# Patient Record
Sex: Female | Born: 1965 | Race: Black or African American | Hispanic: No | Marital: Single | Smoking: Never smoker
Health system: Southern US, Community
[De-identification: ages and names within clinical notes are randomized; demographics above are authoritative.]

## PROBLEM LIST (undated history)

## (undated) DIAGNOSIS — A0472 Enterocolitis due to Clostridium difficile, not specified as recurrent: Secondary | ICD-10-CM

## (undated) DIAGNOSIS — N611 Abscess of the breast and nipple: Secondary | ICD-10-CM

## (undated) DIAGNOSIS — I272 Pulmonary hypertension, unspecified: Secondary | ICD-10-CM

## (undated) DIAGNOSIS — Z7901 Long term (current) use of anticoagulants: Secondary | ICD-10-CM

## (undated) DIAGNOSIS — I2510 Atherosclerotic heart disease of native coronary artery without angina pectoris: Secondary | ICD-10-CM

## (undated) DIAGNOSIS — I314 Cardiac tamponade: Secondary | ICD-10-CM

## (undated) DIAGNOSIS — H269 Unspecified cataract: Secondary | ICD-10-CM

## (undated) DIAGNOSIS — I509 Heart failure, unspecified: Secondary | ICD-10-CM

## (undated) DIAGNOSIS — M351 Other overlap syndromes: Secondary | ICD-10-CM

## (undated) DIAGNOSIS — J984 Other disorders of lung: Secondary | ICD-10-CM

## (undated) DIAGNOSIS — I00 Rheumatic fever without heart involvement: Secondary | ICD-10-CM

## (undated) DIAGNOSIS — I7300 Raynaud's syndrome without gangrene: Secondary | ICD-10-CM

## (undated) DIAGNOSIS — H35039 Hypertensive retinopathy, unspecified eye: Secondary | ICD-10-CM

## (undated) HISTORY — PX: COLONOSCOPY: SHX174

## (undated) HISTORY — DX: Abscess of the breast and nipple: N61.1

## (undated) HISTORY — DX: Unspecified cataract: H26.9

## (undated) HISTORY — DX: Hypertensive retinopathy, unspecified eye: H35.039

## (undated) HISTORY — DX: Raynaud's syndrome without gangrene: I73.00

## (undated) HISTORY — DX: Pulmonary hypertension, unspecified: I27.20

## (undated) HISTORY — PX: TUBAL LIGATION: SHX77

## (undated) HISTORY — DX: Enterocolitis due to Clostridium difficile, not specified as recurrent: A04.72

## (undated) HISTORY — DX: Rheumatic fever without heart involvement: I00

## (undated) HISTORY — DX: Long term (current) use of anticoagulants: Z79.01

## (undated) HISTORY — DX: Heart failure, unspecified: I50.9

## (undated) HISTORY — DX: Other disorders of lung: J98.4

## (undated) HISTORY — DX: Other overlap syndromes: M35.1

## (undated) HISTORY — DX: Cardiac tamponade: I31.4

## (undated) HISTORY — DX: Atherosclerotic heart disease of native coronary artery without angina pectoris: I25.10

---

## 1998-04-27 ENCOUNTER — Ambulatory Visit (HOSPITAL_COMMUNITY): Admission: RE | Admit: 1998-04-27 | Discharge: 1998-04-27 | Payer: Self-pay

## 1998-07-09 ENCOUNTER — Other Ambulatory Visit: Admission: RE | Admit: 1998-07-09 | Discharge: 1998-07-09 | Payer: Self-pay | Admitting: Obstetrics and Gynecology

## 1998-07-14 ENCOUNTER — Emergency Department (HOSPITAL_COMMUNITY): Admission: EM | Admit: 1998-07-14 | Discharge: 1998-07-14 | Payer: Self-pay | Admitting: Emergency Medicine

## 1999-03-06 ENCOUNTER — Ambulatory Visit: Admission: RE | Admit: 1999-03-06 | Discharge: 1999-03-06 | Payer: Self-pay | Admitting: Internal Medicine

## 2000-06-01 ENCOUNTER — Other Ambulatory Visit: Admission: RE | Admit: 2000-06-01 | Discharge: 2000-06-01 | Payer: Self-pay

## 2000-09-02 ENCOUNTER — Emergency Department (HOSPITAL_COMMUNITY): Admission: EM | Admit: 2000-09-02 | Discharge: 2000-09-02 | Payer: Self-pay | Admitting: Emergency Medicine

## 2000-09-04 ENCOUNTER — Emergency Department (HOSPITAL_COMMUNITY): Admission: EM | Admit: 2000-09-04 | Discharge: 2000-09-04 | Payer: Self-pay | Admitting: Emergency Medicine

## 2001-05-24 ENCOUNTER — Other Ambulatory Visit: Admission: RE | Admit: 2001-05-24 | Discharge: 2001-05-24 | Payer: Self-pay | Admitting: Obstetrics and Gynecology

## 2002-06-21 ENCOUNTER — Other Ambulatory Visit: Admission: RE | Admit: 2002-06-21 | Discharge: 2002-06-21 | Payer: Self-pay | Admitting: Obstetrics and Gynecology

## 2003-09-18 ENCOUNTER — Ambulatory Visit (HOSPITAL_COMMUNITY): Admission: RE | Admit: 2003-09-18 | Discharge: 2003-09-18 | Payer: Self-pay | Admitting: Obstetrics and Gynecology

## 2003-10-10 ENCOUNTER — Other Ambulatory Visit: Admission: RE | Admit: 2003-10-10 | Discharge: 2003-10-10 | Payer: Self-pay | Admitting: Obstetrics and Gynecology

## 2004-11-17 ENCOUNTER — Other Ambulatory Visit: Admission: RE | Admit: 2004-11-17 | Discharge: 2004-11-17 | Payer: Self-pay | Admitting: Obstetrics and Gynecology

## 2005-05-05 ENCOUNTER — Other Ambulatory Visit: Admission: RE | Admit: 2005-05-05 | Discharge: 2005-05-05 | Payer: Self-pay | Admitting: Obstetrics and Gynecology

## 2005-09-16 ENCOUNTER — Other Ambulatory Visit: Admission: RE | Admit: 2005-09-16 | Discharge: 2005-09-16 | Payer: Self-pay | Admitting: Obstetrics and Gynecology

## 2005-12-15 ENCOUNTER — Other Ambulatory Visit: Admission: RE | Admit: 2005-12-15 | Discharge: 2005-12-15 | Payer: Self-pay | Admitting: Obstetrics and Gynecology

## 2005-12-31 ENCOUNTER — Encounter: Admission: RE | Admit: 2005-12-31 | Discharge: 2005-12-31 | Payer: Self-pay | Admitting: Obstetrics and Gynecology

## 2006-02-09 ENCOUNTER — Encounter: Payer: Self-pay | Admitting: Emergency Medicine

## 2006-04-05 ENCOUNTER — Other Ambulatory Visit: Admission: RE | Admit: 2006-04-05 | Discharge: 2006-04-05 | Payer: Self-pay | Admitting: Obstetrics and Gynecology

## 2006-04-26 ENCOUNTER — Other Ambulatory Visit: Admission: RE | Admit: 2006-04-26 | Discharge: 2006-04-26 | Payer: Self-pay | Admitting: Obstetrics and Gynecology

## 2007-01-10 ENCOUNTER — Encounter: Admission: RE | Admit: 2007-01-10 | Discharge: 2007-01-10 | Payer: Self-pay | Admitting: Obstetrics and Gynecology

## 2007-03-14 ENCOUNTER — Emergency Department (HOSPITAL_COMMUNITY): Admission: EM | Admit: 2007-03-14 | Discharge: 2007-03-14 | Payer: Self-pay | Admitting: Emergency Medicine

## 2007-03-24 ENCOUNTER — Emergency Department (HOSPITAL_COMMUNITY): Admission: EM | Admit: 2007-03-24 | Discharge: 2007-03-24 | Payer: Self-pay | Admitting: Family Medicine

## 2007-12-27 ENCOUNTER — Encounter: Payer: Self-pay | Admitting: Cardiology

## 2008-02-20 ENCOUNTER — Encounter: Admission: RE | Admit: 2008-02-20 | Discharge: 2008-02-20 | Payer: Self-pay | Admitting: Obstetrics and Gynecology

## 2008-07-05 ENCOUNTER — Encounter: Payer: Self-pay | Admitting: Cardiology

## 2008-07-11 ENCOUNTER — Emergency Department (HOSPITAL_COMMUNITY): Admission: EM | Admit: 2008-07-11 | Discharge: 2008-07-11 | Payer: Self-pay | Admitting: Family Medicine

## 2008-08-01 ENCOUNTER — Ambulatory Visit: Payer: Self-pay | Admitting: Internal Medicine

## 2008-08-01 DIAGNOSIS — I7300 Raynaud's syndrome without gangrene: Secondary | ICD-10-CM | POA: Insufficient documentation

## 2008-08-01 DIAGNOSIS — J8410 Pulmonary fibrosis, unspecified: Secondary | ICD-10-CM | POA: Insufficient documentation

## 2008-08-01 DIAGNOSIS — M329 Systemic lupus erythematosus, unspecified: Secondary | ICD-10-CM | POA: Insufficient documentation

## 2008-09-12 ENCOUNTER — Ambulatory Visit: Payer: Self-pay | Admitting: Internal Medicine

## 2008-10-22 ENCOUNTER — Ambulatory Visit (HOSPITAL_COMMUNITY): Admission: RE | Admit: 2008-10-22 | Discharge: 2008-10-22 | Payer: Self-pay | Admitting: Obstetrics and Gynecology

## 2008-12-04 ENCOUNTER — Encounter: Payer: Self-pay | Admitting: Internal Medicine

## 2008-12-09 ENCOUNTER — Encounter: Payer: Self-pay | Admitting: Internal Medicine

## 2009-02-13 ENCOUNTER — Encounter: Payer: Self-pay | Admitting: Internal Medicine

## 2009-02-20 ENCOUNTER — Encounter: Payer: Self-pay | Admitting: Internal Medicine

## 2009-02-24 ENCOUNTER — Encounter: Payer: Self-pay | Admitting: Internal Medicine

## 2009-03-07 ENCOUNTER — Encounter: Payer: Self-pay | Admitting: Internal Medicine

## 2009-03-14 ENCOUNTER — Telehealth (INDEPENDENT_AMBULATORY_CARE_PROVIDER_SITE_OTHER): Payer: Self-pay | Admitting: *Deleted

## 2009-03-18 ENCOUNTER — Ambulatory Visit: Payer: Self-pay | Admitting: Internal Medicine

## 2009-03-19 ENCOUNTER — Ambulatory Visit: Payer: Self-pay | Admitting: Internal Medicine

## 2009-03-20 ENCOUNTER — Encounter: Payer: Self-pay | Admitting: Obstetrics and Gynecology

## 2009-03-20 ENCOUNTER — Inpatient Hospital Stay (HOSPITAL_COMMUNITY): Admission: AD | Admit: 2009-03-20 | Discharge: 2009-03-20 | Payer: Self-pay | Admitting: Obstetrics and Gynecology

## 2009-03-20 ENCOUNTER — Encounter: Payer: Self-pay | Admitting: Internal Medicine

## 2009-03-24 ENCOUNTER — Telehealth: Payer: Self-pay | Admitting: Internal Medicine

## 2009-03-28 ENCOUNTER — Ambulatory Visit: Payer: Self-pay | Admitting: Internal Medicine

## 2009-03-28 ENCOUNTER — Inpatient Hospital Stay (HOSPITAL_COMMUNITY): Admission: AD | Admit: 2009-03-28 | Discharge: 2009-04-02 | Payer: Self-pay | Admitting: Obstetrics and Gynecology

## 2009-03-28 ENCOUNTER — Ambulatory Visit: Payer: Self-pay | Admitting: Cardiovascular Disease

## 2009-03-29 ENCOUNTER — Encounter (INDEPENDENT_AMBULATORY_CARE_PROVIDER_SITE_OTHER): Payer: Self-pay | Admitting: Obstetrics and Gynecology

## 2009-04-01 ENCOUNTER — Encounter: Payer: Self-pay | Admitting: Internal Medicine

## 2009-04-03 ENCOUNTER — Telehealth (INDEPENDENT_AMBULATORY_CARE_PROVIDER_SITE_OTHER): Payer: Self-pay | Admitting: *Deleted

## 2009-04-11 ENCOUNTER — Ambulatory Visit: Payer: Self-pay | Admitting: Internal Medicine

## 2009-04-11 DIAGNOSIS — I2789 Other specified pulmonary heart diseases: Secondary | ICD-10-CM

## 2009-04-18 ENCOUNTER — Encounter: Payer: Self-pay | Admitting: Internal Medicine

## 2009-04-18 ENCOUNTER — Telehealth (INDEPENDENT_AMBULATORY_CARE_PROVIDER_SITE_OTHER): Payer: Self-pay | Admitting: *Deleted

## 2009-04-23 ENCOUNTER — Ambulatory Visit: Payer: Self-pay | Admitting: Internal Medicine

## 2009-04-25 ENCOUNTER — Ambulatory Visit: Payer: Self-pay | Admitting: Internal Medicine

## 2009-04-25 DIAGNOSIS — N08 Glomerular disorders in diseases classified elsewhere: Secondary | ICD-10-CM | POA: Insufficient documentation

## 2009-04-25 DIAGNOSIS — N049 Nephrotic syndrome with unspecified morphologic changes: Secondary | ICD-10-CM

## 2009-04-25 DIAGNOSIS — J9610 Chronic respiratory failure, unspecified whether with hypoxia or hypercapnia: Secondary | ICD-10-CM | POA: Insufficient documentation

## 2009-04-29 ENCOUNTER — Telehealth (INDEPENDENT_AMBULATORY_CARE_PROVIDER_SITE_OTHER): Payer: Self-pay | Admitting: *Deleted

## 2009-05-21 ENCOUNTER — Encounter: Payer: Self-pay | Admitting: Internal Medicine

## 2009-05-21 ENCOUNTER — Ambulatory Visit: Payer: Self-pay

## 2009-05-22 ENCOUNTER — Encounter: Payer: Self-pay | Admitting: Internal Medicine

## 2009-05-23 ENCOUNTER — Encounter: Payer: Self-pay | Admitting: Emergency Medicine

## 2009-05-26 ENCOUNTER — Telehealth: Payer: Self-pay | Admitting: Internal Medicine

## 2009-06-10 ENCOUNTER — Ambulatory Visit: Payer: Self-pay | Admitting: Emergency Medicine

## 2009-06-10 LAB — CONVERTED CEMR LAB
ANA Titer 1: 1:320 {titer} — ABNORMAL HIGH
ds DNA Ab: 69 — ABNORMAL HIGH (ref <5)

## 2009-06-17 ENCOUNTER — Encounter: Payer: Self-pay | Admitting: Internal Medicine

## 2009-06-19 ENCOUNTER — Encounter: Payer: Self-pay | Admitting: Internal Medicine

## 2009-06-20 ENCOUNTER — Ambulatory Visit: Payer: Self-pay | Admitting: Cardiology

## 2009-06-20 ENCOUNTER — Telehealth: Payer: Self-pay | Admitting: Emergency Medicine

## 2009-06-20 DIAGNOSIS — I5032 Chronic diastolic (congestive) heart failure: Secondary | ICD-10-CM

## 2009-06-20 DIAGNOSIS — I059 Rheumatic mitral valve disease, unspecified: Secondary | ICD-10-CM

## 2009-06-20 LAB — CONVERTED CEMR LAB
CO2: 34 meq/L — ABNORMAL HIGH (ref 19–32)
Calcium: 8.4 mg/dL (ref 8.4–10.5)
Chloride: 104 meq/L (ref 96–112)
Creatinine, Ser: 0.8 mg/dL (ref 0.4–1.2)
HCT: 38.1 % (ref 36.0–46.0)
Hemoglobin: 12.9 g/dL (ref 12.0–15.0)
Lymphs Abs: 0.9 10*3/uL (ref 0.7–4.0)
MCV: 91.6 fL (ref 78.0–100.0)
Monocytes Relative: 11.6 % (ref 3.0–12.0)
Prothrombin Time: 9.6 s (ref 9.1–11.7)
RBC: 4.16 M/uL (ref 3.87–5.11)
RDW: 12.9 % (ref 11.5–14.6)
Sodium: 142 meq/L (ref 135–145)

## 2009-06-24 ENCOUNTER — Telehealth: Payer: Self-pay | Admitting: Emergency Medicine

## 2009-06-25 ENCOUNTER — Ambulatory Visit: Payer: Self-pay | Admitting: Cardiology

## 2009-06-25 ENCOUNTER — Inpatient Hospital Stay (HOSPITAL_BASED_OUTPATIENT_CLINIC_OR_DEPARTMENT_OTHER): Admission: RE | Admit: 2009-06-25 | Discharge: 2009-06-25 | Payer: Self-pay | Admitting: Cardiology

## 2009-06-25 ENCOUNTER — Ambulatory Visit (HOSPITAL_COMMUNITY): Admission: RE | Admit: 2009-06-25 | Discharge: 2009-06-25 | Payer: Self-pay | Admitting: Cardiology

## 2009-06-25 ENCOUNTER — Encounter: Payer: Self-pay | Admitting: Cardiology

## 2009-06-25 ENCOUNTER — Telehealth: Payer: Self-pay | Admitting: Cardiology

## 2009-06-26 ENCOUNTER — Telehealth: Payer: Self-pay | Admitting: Cardiology

## 2009-06-27 ENCOUNTER — Telehealth: Payer: Self-pay | Admitting: Cardiology

## 2009-06-30 ENCOUNTER — Encounter: Payer: Self-pay | Admitting: Internal Medicine

## 2009-06-30 ENCOUNTER — Ambulatory Visit: Payer: Self-pay | Admitting: Thoracic Surgery (Cardiothoracic Vascular Surgery)

## 2009-07-01 ENCOUNTER — Ambulatory Visit: Payer: Self-pay | Admitting: Cardiology

## 2009-07-01 LAB — CONVERTED CEMR LAB
BUN: 12 mg/dL (ref 6–23)
Chloride: 103 meq/L (ref 96–112)
GFR calc non Af Amer: 117.71 mL/min (ref >60)
Glucose, Bld: 77 mg/dL (ref 70–99)
Pro B Natriuretic peptide (BNP): 153 pg/mL — ABNORMAL HIGH (ref 0.0–100.0)
Sodium: 141 meq/L (ref 135–145)

## 2009-07-02 ENCOUNTER — Ambulatory Visit: Payer: Self-pay | Admitting: Emergency Medicine

## 2009-07-14 ENCOUNTER — Encounter: Payer: Self-pay | Admitting: Cardiology

## 2009-07-18 ENCOUNTER — Encounter: Payer: Self-pay | Admitting: Cardiology

## 2009-07-21 ENCOUNTER — Ambulatory Visit: Payer: Self-pay | Admitting: Cardiology

## 2009-07-22 ENCOUNTER — Telehealth: Payer: Self-pay | Admitting: Cardiology

## 2009-07-23 ENCOUNTER — Encounter: Payer: Self-pay | Admitting: Cardiology

## 2009-07-23 ENCOUNTER — Encounter: Payer: Self-pay | Admitting: Internal Medicine

## 2009-07-25 ENCOUNTER — Ambulatory Visit: Payer: Self-pay | Admitting: Internal Medicine

## 2009-07-25 LAB — CONVERTED CEMR LAB: POC INR: 1.2

## 2009-07-28 LAB — CONVERTED CEMR LAB: CO2: 25 meq/L (ref 19–32)

## 2009-08-01 ENCOUNTER — Ambulatory Visit: Payer: Self-pay | Admitting: Cardiology

## 2009-08-06 ENCOUNTER — Telehealth: Payer: Self-pay | Admitting: Cardiology

## 2009-08-07 ENCOUNTER — Telehealth: Payer: Self-pay | Admitting: Cardiology

## 2009-08-08 ENCOUNTER — Ambulatory Visit: Payer: Self-pay | Admitting: Internal Medicine

## 2009-08-08 ENCOUNTER — Ambulatory Visit: Payer: Self-pay | Admitting: Emergency Medicine

## 2009-08-08 LAB — CONVERTED CEMR LAB: POC INR: 1.7

## 2009-08-13 ENCOUNTER — Telehealth: Payer: Self-pay | Admitting: Emergency Medicine

## 2009-08-15 ENCOUNTER — Ambulatory Visit: Payer: Self-pay | Admitting: Cardiovascular Disease

## 2009-08-15 LAB — CONVERTED CEMR LAB: POC INR: 1.8

## 2009-08-21 ENCOUNTER — Ambulatory Visit: Payer: Self-pay | Admitting: Cardiology

## 2009-08-21 DIAGNOSIS — I052 Rheumatic mitral stenosis with insufficiency: Secondary | ICD-10-CM

## 2009-08-21 LAB — CONVERTED CEMR LAB
BUN: 12 mg/dL (ref 6–23)
Calcium: 8.5 mg/dL (ref 8.4–10.5)
Chloride: 102 meq/L (ref 96–112)
Creatinine, Ser: 0.9 mg/dL (ref 0.4–1.2)
GFR calc non Af Amer: 88.01 mL/min (ref >60)
Pro B Natriuretic peptide (BNP): 282 pg/mL — ABNORMAL HIGH (ref 0.0–100.0)
Total CHOL/HDL Ratio: 4
Triglycerides: 99 mg/dL (ref 0.0–149.0)

## 2009-08-25 ENCOUNTER — Ambulatory Visit: Payer: Self-pay | Admitting: Cardiology

## 2009-08-29 ENCOUNTER — Encounter: Payer: Self-pay | Admitting: Cardiology

## 2009-09-05 ENCOUNTER — Ambulatory Visit: Payer: Self-pay | Admitting: Internal Medicine

## 2009-09-09 ENCOUNTER — Ambulatory Visit: Payer: Self-pay | Admitting: Cardiology

## 2009-09-09 DIAGNOSIS — I270 Primary pulmonary hypertension: Secondary | ICD-10-CM | POA: Insufficient documentation

## 2009-09-15 LAB — CONVERTED CEMR LAB
BUN: 14 mg/dL (ref 6–23)
Creatinine, Ser: 0.8 mg/dL (ref 0.4–1.2)
GFR calc non Af Amer: 100.8 mL/min (ref >60)
Glucose, Bld: 87 mg/dL (ref 70–99)
Potassium: 3.7 meq/L (ref 3.5–5.1)

## 2009-09-22 ENCOUNTER — Encounter: Payer: Self-pay | Admitting: Cardiology

## 2009-09-22 ENCOUNTER — Encounter: Payer: Self-pay | Admitting: Internal Medicine

## 2009-09-23 ENCOUNTER — Telehealth (INDEPENDENT_AMBULATORY_CARE_PROVIDER_SITE_OTHER): Payer: Self-pay | Admitting: *Deleted

## 2009-09-26 ENCOUNTER — Encounter: Payer: Self-pay | Admitting: Cardiology

## 2009-09-29 ENCOUNTER — Ambulatory Visit: Payer: Self-pay | Admitting: Cardiology

## 2009-09-29 ENCOUNTER — Encounter: Admission: RE | Admit: 2009-09-29 | Discharge: 2009-09-29 | Payer: Self-pay | Admitting: Obstetrics and Gynecology

## 2009-09-29 LAB — CONVERTED CEMR LAB: POC INR: 2

## 2009-09-30 ENCOUNTER — Telehealth: Payer: Self-pay | Admitting: Cardiology

## 2009-10-02 ENCOUNTER — Telehealth: Payer: Self-pay | Admitting: Cardiology

## 2009-10-06 ENCOUNTER — Ambulatory Visit: Payer: Self-pay | Admitting: Internal Medicine

## 2009-10-06 LAB — CONVERTED CEMR LAB: POC INR: 1.9

## 2009-10-07 ENCOUNTER — Ambulatory Visit: Payer: Self-pay | Admitting: Cardiology

## 2009-10-08 ENCOUNTER — Encounter: Payer: Self-pay | Admitting: Cardiology

## 2009-10-21 ENCOUNTER — Telehealth: Payer: Self-pay | Admitting: Cardiology

## 2009-10-27 ENCOUNTER — Ambulatory Visit: Payer: Self-pay | Admitting: Cardiology

## 2009-10-27 DIAGNOSIS — I050 Rheumatic mitral stenosis: Secondary | ICD-10-CM | POA: Insufficient documentation

## 2009-11-03 ENCOUNTER — Inpatient Hospital Stay (HOSPITAL_BASED_OUTPATIENT_CLINIC_OR_DEPARTMENT_OTHER): Admission: RE | Admit: 2009-11-03 | Discharge: 2009-11-03 | Payer: Self-pay | Admitting: Cardiology

## 2009-11-03 ENCOUNTER — Ambulatory Visit: Payer: Self-pay | Admitting: Cardiology

## 2009-11-03 ENCOUNTER — Telehealth: Payer: Self-pay | Admitting: Cardiology

## 2009-11-03 LAB — CONVERTED CEMR LAB
Basophils Absolute: 0 10*3/uL (ref 0.0–0.1)
Chloride: 102 meq/L (ref 96–112)
Eosinophils Absolute: 0 10*3/uL (ref 0.0–0.7)
Glucose, Bld: 79 mg/dL (ref 70–99)
Lymphs Abs: 0.9 10*3/uL (ref 0.7–4.0)
MCHC: 32.8 g/dL (ref 30.0–36.0)
Neutro Abs: 1.8 10*3/uL (ref 1.4–7.7)
Potassium: 4.4 meq/L (ref 3.5–5.1)
Pro B Natriuretic peptide (BNP): 182 pg/mL — ABNORMAL HIGH (ref 0.0–100.0)
Prothrombin Time: 13.1 s — ABNORMAL HIGH (ref 9.1–11.7)
RBC: 3.88 M/uL (ref 3.87–5.11)
Sodium: 142 meq/L (ref 135–145)
WBC: 3.2 10*3/uL — ABNORMAL LOW (ref 4.5–10.5)

## 2009-11-10 ENCOUNTER — Ambulatory Visit: Payer: Self-pay | Admitting: Cardiology

## 2009-11-10 LAB — CONVERTED CEMR LAB: POC INR: 1.2

## 2009-11-12 ENCOUNTER — Ambulatory Visit (HOSPITAL_COMMUNITY): Admission: RE | Admit: 2009-11-12 | Discharge: 2009-11-12 | Payer: Self-pay | Admitting: Cardiology

## 2009-11-12 ENCOUNTER — Ambulatory Visit: Payer: Self-pay

## 2009-11-12 ENCOUNTER — Ambulatory Visit: Payer: Self-pay | Admitting: Cardiovascular Disease

## 2009-11-12 ENCOUNTER — Encounter: Payer: Self-pay | Admitting: Cardiology

## 2009-11-18 ENCOUNTER — Ambulatory Visit: Payer: Self-pay | Admitting: Cardiology

## 2009-11-18 ENCOUNTER — Ambulatory Visit: Payer: Self-pay | Admitting: Emergency Medicine

## 2009-11-25 ENCOUNTER — Ambulatory Visit: Payer: Self-pay | Admitting: Cardiology

## 2009-11-28 ENCOUNTER — Ambulatory Visit: Payer: Self-pay | Admitting: Cardiology

## 2009-11-28 LAB — CONVERTED CEMR LAB: POC INR: 2.1

## 2009-12-01 ENCOUNTER — Ambulatory Visit: Payer: Self-pay | Admitting: Thoracic Surgery (Cardiothoracic Vascular Surgery)

## 2009-12-01 ENCOUNTER — Encounter: Payer: Self-pay | Admitting: Cardiology

## 2009-12-12 ENCOUNTER — Ambulatory Visit: Payer: Self-pay | Admitting: Cardiology

## 2009-12-12 ENCOUNTER — Encounter: Payer: Self-pay | Admitting: Internal Medicine

## 2009-12-12 ENCOUNTER — Encounter: Payer: Self-pay | Admitting: Cardiology

## 2009-12-12 ENCOUNTER — Encounter (INDEPENDENT_AMBULATORY_CARE_PROVIDER_SITE_OTHER): Payer: Self-pay | Admitting: Cardiology

## 2009-12-12 LAB — CONVERTED CEMR LAB: POC INR: 1.4

## 2009-12-18 ENCOUNTER — Encounter: Payer: Self-pay | Admitting: Internal Medicine

## 2009-12-18 ENCOUNTER — Encounter: Payer: Self-pay | Admitting: Cardiology

## 2009-12-31 ENCOUNTER — Encounter: Payer: Self-pay | Admitting: Cardiology

## 2010-01-02 ENCOUNTER — Ambulatory Visit: Payer: Self-pay | Admitting: Internal Medicine

## 2010-01-02 LAB — CONVERTED CEMR LAB: POC INR: 1.8

## 2010-01-26 ENCOUNTER — Ambulatory Visit: Payer: Self-pay | Admitting: Cardiology

## 2010-01-26 LAB — CONVERTED CEMR LAB: POC INR: 2.1

## 2010-02-23 ENCOUNTER — Ambulatory Visit: Payer: Self-pay | Admitting: Thoracic Surgery (Cardiothoracic Vascular Surgery)

## 2010-02-25 ENCOUNTER — Ambulatory Visit: Payer: Self-pay | Admitting: Cardiology

## 2010-02-25 ENCOUNTER — Telehealth: Payer: Self-pay | Admitting: Emergency Medicine

## 2010-02-25 ENCOUNTER — Telehealth: Payer: Self-pay | Admitting: Cardiology

## 2010-02-26 ENCOUNTER — Encounter: Payer: Self-pay | Admitting: Cardiology

## 2010-02-26 ENCOUNTER — Encounter: Payer: Self-pay | Admitting: Internal Medicine

## 2010-03-02 ENCOUNTER — Encounter: Payer: Self-pay | Admitting: Internal Medicine

## 2010-03-02 ENCOUNTER — Encounter
Admission: RE | Admit: 2010-03-02 | Discharge: 2010-03-02 | Payer: Self-pay | Admitting: Thoracic Surgery (Cardiothoracic Vascular Surgery)

## 2010-03-02 ENCOUNTER — Ambulatory Visit: Payer: Self-pay | Admitting: Thoracic Surgery (Cardiothoracic Vascular Surgery)

## 2010-03-02 ENCOUNTER — Telehealth: Payer: Self-pay | Admitting: Cardiology

## 2010-03-13 ENCOUNTER — Encounter: Payer: Self-pay | Admitting: Thoracic Surgery (Cardiothoracic Vascular Surgery)

## 2010-03-13 ENCOUNTER — Ambulatory Visit: Payer: Self-pay | Admitting: Vascular Surgery

## 2010-03-14 ENCOUNTER — Emergency Department (HOSPITAL_COMMUNITY): Admission: EM | Admit: 2010-03-14 | Discharge: 2010-03-14 | Payer: Self-pay | Admitting: Emergency Medicine

## 2010-03-16 ENCOUNTER — Ambulatory Visit: Payer: Self-pay | Admitting: Thoracic Surgery (Cardiothoracic Vascular Surgery)

## 2010-03-16 ENCOUNTER — Encounter: Payer: Self-pay | Admitting: Critical Care Medicine

## 2010-03-17 ENCOUNTER — Ambulatory Visit (HOSPITAL_COMMUNITY)
Admission: RE | Admit: 2010-03-17 | Discharge: 2010-03-17 | Payer: Self-pay | Admitting: Thoracic Surgery (Cardiothoracic Vascular Surgery)

## 2010-03-24 ENCOUNTER — Telehealth: Payer: Self-pay | Admitting: Cardiology

## 2010-04-15 ENCOUNTER — Ambulatory Visit: Payer: Self-pay | Admitting: Cardiology

## 2010-04-15 ENCOUNTER — Ambulatory Visit: Payer: Self-pay | Admitting: Internal Medicine

## 2010-04-20 ENCOUNTER — Encounter: Payer: Self-pay | Admitting: Cardiology

## 2010-04-20 ENCOUNTER — Ambulatory Visit: Payer: Self-pay | Admitting: Thoracic Surgery (Cardiothoracic Vascular Surgery)

## 2010-04-20 ENCOUNTER — Encounter (HOSPITAL_COMMUNITY): Admission: RE | Admit: 2010-04-20 | Discharge: 2010-05-13 | Payer: Self-pay | Admitting: Nephrology

## 2010-05-13 ENCOUNTER — Inpatient Hospital Stay (HOSPITAL_COMMUNITY)
Admission: RE | Admit: 2010-05-13 | Discharge: 2010-05-20 | Payer: Self-pay | Admitting: Thoracic Surgery (Cardiothoracic Vascular Surgery)

## 2010-05-13 ENCOUNTER — Encounter: Payer: Self-pay | Admitting: Thoracic Surgery (Cardiothoracic Vascular Surgery)

## 2010-05-13 ENCOUNTER — Encounter: Payer: Self-pay | Admitting: Cardiology

## 2010-05-13 ENCOUNTER — Ambulatory Visit: Payer: Self-pay | Admitting: Thoracic Surgery (Cardiothoracic Vascular Surgery)

## 2010-05-13 HISTORY — PX: OTHER SURGICAL HISTORY: SHX169

## 2010-05-25 ENCOUNTER — Ambulatory Visit: Payer: Self-pay | Admitting: Cardiology

## 2010-05-25 LAB — CONVERTED CEMR LAB: INR: 7.19

## 2010-05-26 LAB — CONVERTED CEMR LAB: Prothrombin Time: 61.1 s — ABNORMAL HIGH (ref 11.6–15.2)

## 2010-05-28 ENCOUNTER — Encounter: Payer: Self-pay | Admitting: Cardiology

## 2010-05-28 ENCOUNTER — Encounter: Payer: Self-pay | Admitting: Internal Medicine

## 2010-05-29 ENCOUNTER — Encounter: Payer: Self-pay | Admitting: Cardiology

## 2010-05-29 ENCOUNTER — Ambulatory Visit: Payer: Self-pay | Admitting: Cardiology

## 2010-05-29 ENCOUNTER — Inpatient Hospital Stay (HOSPITAL_COMMUNITY): Admission: EM | Admit: 2010-05-29 | Discharge: 2010-06-06 | Payer: Self-pay | Admitting: Cardiology

## 2010-05-29 LAB — CONVERTED CEMR LAB: POC INR: 4.1

## 2010-05-30 ENCOUNTER — Encounter: Payer: Self-pay | Admitting: Cardiothoracic Surgery

## 2010-06-03 ENCOUNTER — Encounter: Payer: Self-pay | Admitting: Thoracic Surgery (Cardiothoracic Vascular Surgery)

## 2010-06-08 ENCOUNTER — Encounter: Payer: Self-pay | Admitting: Cardiology

## 2010-06-08 ENCOUNTER — Ambulatory Visit: Payer: Self-pay | Admitting: Cardiology

## 2010-06-12 ENCOUNTER — Encounter
Admission: RE | Admit: 2010-06-12 | Discharge: 2010-06-12 | Payer: Self-pay | Admitting: Thoracic Surgery (Cardiothoracic Vascular Surgery)

## 2010-06-12 ENCOUNTER — Encounter: Payer: Self-pay | Admitting: Cardiology

## 2010-06-12 ENCOUNTER — Telehealth: Payer: Self-pay | Admitting: Cardiology

## 2010-06-12 ENCOUNTER — Ambulatory Visit: Payer: Self-pay | Admitting: Thoracic Surgery (Cardiothoracic Vascular Surgery)

## 2010-06-16 ENCOUNTER — Telehealth: Payer: Self-pay | Admitting: Cardiology

## 2010-06-16 LAB — CONVERTED CEMR LAB
BUN: 15 mg/dL (ref 6–23)
Basophils Absolute: 0 10*3/uL (ref 0.0–0.1)
Creatinine, Ser: 1.1 mg/dL (ref 0.4–1.2)
GFR calc non Af Amer: 68.84 mL/min (ref >60)
Lymphocytes Relative: 7.9 % — ABNORMAL LOW (ref 12.0–46.0)
Lymphs Abs: 0.7 10*3/uL (ref 0.7–4.0)
Monocytes Relative: 10.1 % (ref 3.0–12.0)
Platelets: 316 10*3/uL (ref 150.0–400.0)
Pro B Natriuretic peptide (BNP): 214.5 pg/mL — ABNORMAL HIGH (ref 0.0–100.0)
RDW: 19 % — ABNORMAL HIGH (ref 11.5–14.6)

## 2010-06-17 ENCOUNTER — Ambulatory Visit: Payer: Self-pay | Admitting: Cardiovascular Disease

## 2010-06-17 LAB — CONVERTED CEMR LAB
INR: 5.5 (ref 0.8–1.0)
POC INR: 6.2

## 2010-06-18 ENCOUNTER — Encounter (HOSPITAL_COMMUNITY): Admission: RE | Admit: 2010-06-18 | Discharge: 2010-07-10 | Payer: Self-pay | Admitting: Cardiology

## 2010-06-22 ENCOUNTER — Encounter: Payer: Self-pay | Admitting: Cardiology

## 2010-06-22 ENCOUNTER — Ambulatory Visit: Payer: Self-pay | Admitting: Thoracic Surgery (Cardiothoracic Vascular Surgery)

## 2010-06-23 ENCOUNTER — Ambulatory Visit: Payer: Self-pay | Admitting: Cardiovascular Disease

## 2010-06-23 LAB — CONVERTED CEMR LAB: POC INR: 4.1

## 2010-06-24 ENCOUNTER — Telehealth: Payer: Self-pay | Admitting: Cardiology

## 2010-06-24 ENCOUNTER — Ambulatory Visit: Payer: Self-pay | Admitting: Cardiology

## 2010-06-24 LAB — CONVERTED CEMR LAB
ALT: 13 units/L (ref 0–35)
Basophils Absolute: 0 10*3/uL (ref 0.0–0.1)
Bilirubin, Direct: 0.2 mg/dL (ref 0.0–0.3)
CO2: 31 meq/L (ref 19–32)
Calcium: 9.2 mg/dL (ref 8.4–10.5)
Creatinine, Ser: 0.9 mg/dL (ref 0.4–1.2)
Eosinophils Absolute: 0 10*3/uL (ref 0.0–0.7)
Eosinophils Relative: 0 % (ref 0.0–5.0)
MCV: 92.7 fL (ref 78.0–100.0)
Monocytes Absolute: 0.5 10*3/uL (ref 0.1–1.0)
Neutrophils Relative %: 79.3 % — ABNORMAL HIGH (ref 43.0–77.0)
Platelets: 322 10*3/uL (ref 150.0–400.0)
RDW: 18.2 % — ABNORMAL HIGH (ref 11.5–14.6)
Total Bilirubin: 0.6 mg/dL (ref 0.3–1.2)
WBC: 7.6 10*3/uL (ref 4.5–10.5)

## 2010-06-29 ENCOUNTER — Encounter: Payer: Self-pay | Admitting: Cardiology

## 2010-07-01 ENCOUNTER — Ambulatory Visit: Payer: Self-pay | Admitting: Cardiology

## 2010-07-01 LAB — CONVERTED CEMR LAB: POC INR: 3.3

## 2010-07-06 ENCOUNTER — Telehealth: Payer: Self-pay | Admitting: Cardiology

## 2010-07-06 LAB — CONVERTED CEMR LAB
Alkaline Phosphatase: 80 units/L (ref 39–117)
Bilirubin, Direct: 0.1 mg/dL (ref 0.0–0.3)
TSH: 4.08 microintl units/mL (ref 0.35–5.50)
Total Bilirubin: 0.5 mg/dL (ref 0.3–1.2)

## 2010-07-08 ENCOUNTER — Telehealth: Payer: Self-pay | Admitting: Cardiology

## 2010-07-08 ENCOUNTER — Encounter: Payer: Self-pay | Admitting: Cardiology

## 2010-07-10 ENCOUNTER — Ambulatory Visit: Payer: Self-pay | Admitting: Cardiology

## 2010-07-10 ENCOUNTER — Encounter: Payer: Self-pay | Admitting: Cardiology

## 2010-07-11 ENCOUNTER — Encounter (HOSPITAL_COMMUNITY)
Admission: RE | Admit: 2010-07-11 | Discharge: 2010-10-09 | Payer: Self-pay | Source: Home / Self Care | Attending: Cardiology | Admitting: Cardiology

## 2010-07-13 LAB — CONVERTED CEMR LAB
BUN: 7 mg/dL
Basophils Absolute: 0 K/uL
Basophils Relative: 0.2 %
CO2: 31 meq/L
Calcium: 8.6 mg/dL
Chloride: 103 meq/L
Creatinine, Ser: 0.8 mg/dL
Eosinophils Absolute: 0.1 K/uL
Eosinophils Relative: 1.1 %
GFR calc non Af Amer: 106.53 mL/min
Glucose, Bld: 78 mg/dL
HCT: 32.6 % — ABNORMAL LOW
Hemoglobin: 10.6 g/dL — ABNORMAL LOW
INR: 5 — ABNORMAL HIGH
Lymphocytes Relative: 18.8 %
Lymphs Abs: 1.4 K/uL
MCHC: 32.4 g/dL
MCV: 92.5 fL
Monocytes Absolute: 0.9 K/uL
Monocytes Relative: 13 % — ABNORMAL HIGH
Neutro Abs: 4.8 K/uL
Neutrophils Relative %: 66.9 %
Platelets: 397 K/uL
Potassium: 3.9 meq/L
Prothrombin Time: 53.9 s
RBC: 3.53 M/uL — ABNORMAL LOW
RDW: 17.8 % — ABNORMAL HIGH
Sodium: 142 meq/L
WBC: 7.2 10*3/microliter

## 2010-07-14 ENCOUNTER — Ambulatory Visit: Payer: Self-pay | Admitting: Cardiology

## 2010-07-14 ENCOUNTER — Inpatient Hospital Stay (HOSPITAL_BASED_OUTPATIENT_CLINIC_OR_DEPARTMENT_OTHER): Admission: RE | Admit: 2010-07-14 | Discharge: 2010-07-14 | Payer: Self-pay | Admitting: Cardiology

## 2010-07-14 ENCOUNTER — Telehealth: Payer: Self-pay | Admitting: Cardiology

## 2010-07-20 ENCOUNTER — Telehealth: Payer: Self-pay | Admitting: Cardiology

## 2010-07-20 ENCOUNTER — Encounter: Payer: Self-pay | Admitting: Cardiology

## 2010-07-20 ENCOUNTER — Encounter
Admission: RE | Admit: 2010-07-20 | Discharge: 2010-07-20 | Payer: Self-pay | Admitting: Thoracic Surgery (Cardiothoracic Vascular Surgery)

## 2010-07-20 ENCOUNTER — Ambulatory Visit: Payer: Self-pay | Admitting: Thoracic Surgery (Cardiothoracic Vascular Surgery)

## 2010-07-21 ENCOUNTER — Telehealth: Payer: Self-pay | Admitting: Cardiology

## 2010-07-22 ENCOUNTER — Ambulatory Visit: Payer: Self-pay | Admitting: Cardiology

## 2010-07-22 LAB — CONVERTED CEMR LAB: POC INR: 3.2

## 2010-07-23 ENCOUNTER — Encounter: Payer: Self-pay | Admitting: Cardiology

## 2010-07-30 ENCOUNTER — Encounter: Payer: Self-pay | Admitting: Cardiology

## 2010-08-03 ENCOUNTER — Encounter
Admission: RE | Admit: 2010-08-03 | Discharge: 2010-08-03 | Payer: Self-pay | Admitting: Thoracic Surgery (Cardiothoracic Vascular Surgery)

## 2010-08-03 ENCOUNTER — Encounter: Payer: Self-pay | Admitting: Cardiology

## 2010-08-03 ENCOUNTER — Ambulatory Visit: Payer: Self-pay | Admitting: Internal Medicine

## 2010-08-03 ENCOUNTER — Ambulatory Visit: Payer: Self-pay | Admitting: Thoracic Surgery (Cardiothoracic Vascular Surgery)

## 2010-08-03 ENCOUNTER — Ambulatory Visit: Payer: Self-pay | Admitting: Cardiology

## 2010-08-03 ENCOUNTER — Ambulatory Visit (HOSPITAL_COMMUNITY)
Admission: RE | Admit: 2010-08-03 | Discharge: 2010-08-03 | Payer: Self-pay | Admitting: Thoracic Surgery (Cardiothoracic Vascular Surgery)

## 2010-08-03 ENCOUNTER — Ambulatory Visit: Payer: Self-pay

## 2010-08-03 ENCOUNTER — Ambulatory Visit (HOSPITAL_COMMUNITY): Admission: RE | Admit: 2010-08-03 | Discharge: 2010-08-03 | Payer: Self-pay | Admitting: Internal Medicine

## 2010-08-03 DIAGNOSIS — R079 Chest pain, unspecified: Secondary | ICD-10-CM | POA: Insufficient documentation

## 2010-08-03 LAB — CONVERTED CEMR LAB: POC INR: 3.1

## 2010-08-06 ENCOUNTER — Telehealth (INDEPENDENT_AMBULATORY_CARE_PROVIDER_SITE_OTHER): Payer: Self-pay | Admitting: *Deleted

## 2010-08-10 ENCOUNTER — Ambulatory Visit: Payer: Self-pay | Admitting: Cardiology

## 2010-08-11 ENCOUNTER — Encounter: Payer: Self-pay | Admitting: Cardiology

## 2010-08-11 ENCOUNTER — Ambulatory Visit: Payer: Self-pay | Admitting: Thoracic Surgery (Cardiothoracic Vascular Surgery)

## 2010-08-17 ENCOUNTER — Encounter: Payer: Self-pay | Admitting: Cardiology

## 2010-08-17 ENCOUNTER — Ambulatory Visit: Payer: Self-pay | Admitting: Cardiology

## 2010-08-20 LAB — CONVERTED CEMR LAB
Basophils Relative: 0.6 % (ref 0.0–3.0)
Eosinophils Absolute: 0.1 10*3/uL (ref 0.0–0.7)
Eosinophils Relative: 1 % (ref 0.0–5.0)
Hemoglobin: 11.2 g/dL — ABNORMAL LOW (ref 12.0–15.0)
Lymphocytes Relative: 21.5 % (ref 12.0–46.0)
MCHC: 32.7 g/dL (ref 30.0–36.0)
Neutro Abs: 3.6 10*3/uL (ref 1.4–7.7)
Neutrophils Relative %: 64.5 % (ref 43.0–77.0)
RBC: 3.8 M/uL — ABNORMAL LOW (ref 3.87–5.11)
WBC: 5.5 10*3/uL (ref 4.5–10.5)

## 2010-08-24 ENCOUNTER — Encounter: Payer: Self-pay | Admitting: Cardiology

## 2010-08-24 ENCOUNTER — Ambulatory Visit: Payer: Self-pay | Admitting: Thoracic Surgery (Cardiothoracic Vascular Surgery)

## 2010-08-26 ENCOUNTER — Encounter: Payer: Self-pay | Admitting: Cardiology

## 2010-08-26 ENCOUNTER — Encounter
Admission: RE | Admit: 2010-08-26 | Discharge: 2010-08-26 | Payer: Self-pay | Admitting: Thoracic Surgery (Cardiothoracic Vascular Surgery)

## 2010-08-31 ENCOUNTER — Encounter: Payer: Self-pay | Admitting: Infectious Diseases

## 2010-09-09 ENCOUNTER — Telehealth: Payer: Self-pay | Admitting: Cardiology

## 2010-09-10 ENCOUNTER — Ambulatory Visit: Payer: Self-pay | Admitting: Cardiovascular Disease

## 2010-09-10 LAB — CONVERTED CEMR LAB: POC INR: 1.3

## 2010-09-14 ENCOUNTER — Telehealth: Payer: Self-pay | Admitting: Cardiology

## 2010-09-14 ENCOUNTER — Ambulatory Visit: Payer: Self-pay | Admitting: Thoracic Surgery (Cardiothoracic Vascular Surgery)

## 2010-09-14 ENCOUNTER — Encounter: Payer: Self-pay | Admitting: Infectious Diseases

## 2010-09-16 ENCOUNTER — Telehealth (INDEPENDENT_AMBULATORY_CARE_PROVIDER_SITE_OTHER): Payer: Self-pay | Admitting: *Deleted

## 2010-09-16 ENCOUNTER — Encounter: Payer: Self-pay | Admitting: Cardiology

## 2010-09-18 ENCOUNTER — Ambulatory Visit: Payer: Self-pay | Admitting: Internal Medicine

## 2010-09-18 LAB — CONVERTED CEMR LAB: POC INR: 1.6

## 2010-09-21 ENCOUNTER — Telehealth: Payer: Self-pay | Admitting: Cardiology

## 2010-09-23 ENCOUNTER — Encounter: Payer: Self-pay | Admitting: Cardiology

## 2010-09-29 ENCOUNTER — Encounter: Payer: Self-pay | Admitting: Infectious Disease

## 2010-09-29 ENCOUNTER — Ambulatory Visit: Payer: Self-pay | Admitting: Infectious Disease

## 2010-09-29 DIAGNOSIS — M8618 Other acute osteomyelitis, other site: Secondary | ICD-10-CM | POA: Insufficient documentation

## 2010-09-29 DIAGNOSIS — N61 Mastitis without abscess: Secondary | ICD-10-CM

## 2010-09-29 DIAGNOSIS — B965 Pseudomonas (aeruginosa) (mallei) (pseudomallei) as the cause of diseases classified elsewhere: Secondary | ICD-10-CM

## 2010-09-29 LAB — CONVERTED CEMR LAB
BUN: 11 mg/dL (ref 6–23)
Basophils Absolute: 0 10*3/uL (ref 0.0–0.1)
CRP: 1.9 mg/dL — ABNORMAL HIGH (ref <0.6)
Calcium: 8.5 mg/dL (ref 8.4–10.5)
Eosinophils Absolute: 0.1 10*3/uL (ref 0.0–0.7)
Glucose, Bld: 84 mg/dL (ref 70–99)
Lymphocytes Relative: 20 % (ref 12–46)
Lymphs Abs: 1 10*3/uL (ref 0.7–4.0)
Neutrophils Relative %: 70 % (ref 43–77)
Platelets: 232 10*3/uL (ref 150–400)
Sed Rate: 7 mm/hr (ref 0–22)
WBC: 4.9 10*3/uL (ref 4.0–10.5)

## 2010-09-30 ENCOUNTER — Ambulatory Visit: Payer: Self-pay | Admitting: Cardiology

## 2010-10-01 LAB — CONVERTED CEMR LAB
Chloride: 103 meq/L (ref 96–112)
Creatinine, Ser: 0.6 mg/dL (ref 0.4–1.2)
GFR calc non Af Amer: 129.76 mL/min (ref >60.00)

## 2010-10-02 ENCOUNTER — Ambulatory Visit: Payer: Self-pay | Admitting: Cardiology

## 2010-10-02 ENCOUNTER — Encounter: Payer: Self-pay | Admitting: Cardiology

## 2010-10-02 LAB — CONVERTED CEMR LAB: POC INR: 1.5

## 2010-10-08 ENCOUNTER — Telehealth: Payer: Self-pay | Admitting: Cardiology

## 2010-10-09 ENCOUNTER — Encounter: Payer: Self-pay | Admitting: Cardiology

## 2010-10-11 ENCOUNTER — Encounter (HOSPITAL_COMMUNITY)
Admission: RE | Admit: 2010-10-11 | Discharge: 2010-11-10 | Payer: Self-pay | Source: Home / Self Care | Attending: Cardiology | Admitting: Cardiology

## 2010-10-11 DIAGNOSIS — A0472 Enterocolitis due to Clostridium difficile, not specified as recurrent: Secondary | ICD-10-CM

## 2010-10-11 HISTORY — DX: Enterocolitis due to Clostridium difficile, not specified as recurrent: A04.72

## 2010-10-19 ENCOUNTER — Ambulatory Visit: Admit: 2010-10-19 | Payer: Self-pay

## 2010-10-20 ENCOUNTER — Telehealth: Payer: Self-pay | Admitting: Cardiology

## 2010-10-22 ENCOUNTER — Telehealth: Payer: Self-pay | Admitting: Cardiology

## 2010-10-22 ENCOUNTER — Encounter: Payer: Self-pay | Admitting: Cardiology

## 2010-10-22 ENCOUNTER — Encounter (INDEPENDENT_AMBULATORY_CARE_PROVIDER_SITE_OTHER): Payer: Self-pay | Admitting: Pharmacist

## 2010-10-23 ENCOUNTER — Other Ambulatory Visit: Payer: Self-pay | Admitting: Cardiology

## 2010-10-23 ENCOUNTER — Ambulatory Visit: Admission: RE | Admit: 2010-10-23 | Discharge: 2010-10-23 | Payer: Self-pay | Source: Home / Self Care

## 2010-10-23 LAB — CONVERTED CEMR LAB: POC INR: 8

## 2010-10-23 LAB — PROTIME-INR
INR: 15.2 ratio (ref 0.8–1.0)
Prothrombin Time: 128.1 s (ref 10.2–12.4)

## 2010-10-24 ENCOUNTER — Encounter (INDEPENDENT_AMBULATORY_CARE_PROVIDER_SITE_OTHER): Payer: Self-pay | Admitting: Pharmacist

## 2010-10-26 ENCOUNTER — Encounter: Payer: Self-pay | Admitting: Cardiology

## 2010-10-26 ENCOUNTER — Encounter
Admission: RE | Admit: 2010-10-26 | Discharge: 2010-10-26 | Payer: Self-pay | Source: Home / Self Care | Attending: Thoracic Surgery (Cardiothoracic Vascular Surgery) | Admitting: Thoracic Surgery (Cardiothoracic Vascular Surgery)

## 2010-10-26 ENCOUNTER — Ambulatory Visit
Admission: RE | Admit: 2010-10-26 | Discharge: 2010-10-26 | Payer: Self-pay | Source: Home / Self Care | Attending: Thoracic Surgery (Cardiothoracic Vascular Surgery) | Admitting: Thoracic Surgery (Cardiothoracic Vascular Surgery)

## 2010-10-26 LAB — CONVERTED CEMR LAB: POC INR: 7.54

## 2010-10-27 ENCOUNTER — Ambulatory Visit
Admission: RE | Admit: 2010-10-27 | Discharge: 2010-10-27 | Payer: Self-pay | Source: Home / Self Care | Attending: Cardiology | Admitting: Cardiology

## 2010-10-29 ENCOUNTER — Encounter: Payer: Self-pay | Admitting: Cardiology

## 2010-11-01 ENCOUNTER — Encounter: Payer: Self-pay | Admitting: Thoracic Surgery (Cardiothoracic Vascular Surgery)

## 2010-11-01 ENCOUNTER — Encounter: Payer: Self-pay | Admitting: Rheumatology

## 2010-11-02 ENCOUNTER — Encounter: Payer: Self-pay | Admitting: Obstetrics and Gynecology

## 2010-11-05 ENCOUNTER — Encounter
Admission: RE | Admit: 2010-11-05 | Discharge: 2010-11-05 | Payer: Self-pay | Source: Home / Self Care | Attending: Obstetrics and Gynecology | Admitting: Obstetrics and Gynecology

## 2010-11-06 ENCOUNTER — Encounter
Admission: RE | Admit: 2010-11-06 | Discharge: 2010-11-06 | Payer: Self-pay | Source: Home / Self Care | Attending: Gastroenterology | Admitting: Gastroenterology

## 2010-11-09 ENCOUNTER — Ambulatory Visit: Admission: RE | Admit: 2010-11-09 | Discharge: 2010-11-09 | Payer: Self-pay | Source: Home / Self Care

## 2010-11-11 ENCOUNTER — Ambulatory Visit: Payer: BC Managed Care – PPO | Admitting: Infectious Disease

## 2010-11-11 ENCOUNTER — Ambulatory Visit: Admit: 2010-11-11 | Payer: Self-pay | Admitting: Infectious Disease

## 2010-11-12 NOTE — Medication Information (Signed)
Summary: rov/ewj  Anticoagulant Therapy  Managed by: Cloyde Reams, RN, BSN Referring MD: [name] MD, Dalton PCP: Dr. Azzie Roup Supervising MD: Myrtis Ser MD, Tinnie Gens Indication 1: Pulmonary Hypertension Lab Used: [facility] [place] Site: [address] INR POC 2.1 INR RANGE 1.5-2.5  Dietary changes: no    Health status changes: no    Bleeding/hemorrhagic complications: no    Recent/future hospitalizations: no    Any changes in medication regimen? no    Recent/future dental: no  Any missed doses?: no       Is patient compliant with meds? yes       Allergies (verified): 1)  ! Methotrexate  Anticoagulation Management History:      The patient is taking warfarin and comes in today for a routine follow up visit.  Negative risk factors for bleeding include an age less than 71 years old.  The bleeding index is 'low risk'.  Positive CHADS2 values include History of CHF.  Negative CHADS2 values include Age > 32 years old.  Her last INR was 1.3 ratio.  Anticoagulation responsible provider: Myrtis Ser MD, Tinnie Gens.  INR POC: 2.1.  Cuvette Lot#: [id number].  Exp: 01/2011.    Anticoagulation Management Assessment/Plan:      The patient's current anticoagulation dose is Warfarin sodium 5 mg tabs: 1 daily or as directed.  The target INR is 1.5-2.5.  The next INR is due 12/12/2009.  Anticoagulation instructions were given to patient.  Results were reviewed/authorized by Cloyde Reams, RN, BSN.  She was notified by Cloyde Reams RN.         Prior Anticoagulation Instructions: INR 1.4  Take 1.5 tablets today, then start taking 1.5 tablets daily except 1 tablet on Tuesdays, Thursdays, and Saturdays.  Recheck in 10 days.    Current Anticoagulation Instructions: INR 2.1  Continue on same dosage 1.5 tablets daily except 1 tablet on Tuesdays, Thursdays, and Saturdays.  Recheck in 2 weeks.

## 2010-11-12 NOTE — Assessment & Plan Note (Signed)
Summary: 6wk f/u sl   Primary Provider:  Dr. 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  Patient has a complicated history.  She has been followed by pulmonology for interstitial lung disease.  Beginning in 1/10, after becoming pregnant, she became increasingly short of breath.  In around 2/10, she was started on lasix.  By 6/10, she was short of breath just walking around in her house.  She delivered in 6/10 by C-section.  Echo at the time of her hospitalization for C-section showed severe pulmonary hypertension as well as at least moderate mitral regurgitation.  She was found at this hospitalization to be hypoxic and was started on home O2.    She has had TTE, TEE, and RHC/LHC (see description in PMH).  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    I started her on Revatio and increased it up to 80 mg three times a day.  She has tolerated this very well and has improved symptomatically.  She is not short of breath walking on flat ground and can climb a flight of steps.  She is no longer using oxygen.  Repeat RHC showed that mean PA pressure had decreased from 50 mmHg to 30 mmHg and PVR had decreased from 5.7 to 2.3 WU.  Repeat echo showed moderate to severe mitral stenosis and moderate mitral regurgitation.     Labs (9/10): K 3.7, creatinine 0.7, BNP 153 Labs (11/10): K 3.7, creatinine 0.8, LDL 115, HDL 44, BNP 282 Labs (1/11): K 4.4, creatinine 0.9, BNP 182      Current Medications (verified): 1)  Prednisone 10 Mg Tabs (Prednisone) .Marland Kitchen.. 1 By Mouth Daily 2)  Folic  Acid 1 Mg Tabs (Folic Acid) .Marland Kitchen.. 1 By Mouth Daily 3)  Multivitamins   Tabs (Multiple Vitamin) .Marland Kitchen.. 1 Tab By Mouth Once Daily 4)  Tylenol 325 Mg Tabs (Acetaminophen) .... As Directed As Needed 5)  Furosemide 40 Mg Tabs (Furosemide) .... One Tablet in The Morning and One Tablet in The Evening 6)  Plaquenil 200 Mg Tabs (Hydroxychloroquine Sulfate) .... 2 Tablets Daily 7)  Revatio 20 Mg Tabs (Sildenafil Citrate) .... Four  Tablets  Three Times A Day 8)  Potassium Chloride Crys Cr 20 Meq Cr-Tabs (Potassium Chloride Crys Cr) .... Take One Tablet By Mouth Daily 9)  Warfarin Sodium 5 Mg Tabs (Warfarin Sodium) .Marland Kitchen.. 1 Daily or As Directed 10)  Imuran 50 Mg Tabs (Azathioprine) .... Take 1 Tablet Daily.  Allergies (verified): 1)  ! Methotrexate  Past History:  Past Medical History: 1. Mixed connective tissue disease: diagnosis 1990. 8/10 ANA positive, anti-dsDNA positive, anti-SCL70 negative 2. 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 CHF: Secondary to moderate mitral stenosis and moderate mitral regurgitation in the setting of rheumatic mitral valve disease as well as severe pulmonary HTN likely due to a combination of MV disease  and pulmonary arterial HTN in the setting of collagen vascular disease.   Not valvuloplasty candidate due to MR.   - TEE (9/10): Rheumatic MV, moderate MR and moderate MS with mean MV gradient 13 mmHg (gradient may be in severe range due to significant MR).   - RHC (9/10): mean RA 12, PA 72/28 mean 50, mean PCWP 23, PVR 5.7 WU, mean MV gradient 12.5 mmHg and MVA 1.3 cm^2. LV-gram with mild to moderate MR.  - RHC (1/11): mean RA 8, PA 45/21 mean 30, mean PCWP 18, large v waves in wedge tracing, PVR 2.3 WU, CI 2.7.  - TTE (2/11): EF 60-65%,  rheumatic MV with probably moderate to possibly severe MS (mean gradient 15 mmHg but MVA 1.46 cm^2 by PHT and 1.15 cm^2 by continuity), moderate MR.  6.  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  CTA chest (7/10) with no evidence for pulmonary emboli or chronic pulmonary emboli.   - 10/10 6 minute walk 418 meters 7.  ? childhood rheumatic fever: episode of joint pains lasting for several weeks around 45 years old.  Never had diagnosis.  8.  LHC (9/10): no angiographic CAD.   Family History: Reviewed history from 06/20/2009 and no changes required. Emphysema mother, smoker.  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  Lives with father of child.   Review of Systems       All systems reviewed and negative except as per HPI.   Vital Signs:  Patient profile:   45 year old female Height:      65 inches Weight:      186 pounds BMI:     31.06 Pulse rate:   79 / minute Resp:     16 per minute BP sitting:   107 / 76  (right arm)  Vitals Entered By: Marrion Coy, CNA (November 25, 2009 10:13 AM)  Physical Exam  General:  Well developed, well nourished, in no acute distress. Neck:  Neck supple, no JVD. No masses, thyromegaly or abnormal cervical nodes. Lungs:  Slight dry basilar crackles, otherwise clear.  Heart:  Non-displaced PMI, chest non-tender; regular rate and rhythm, S1, S2. 2/6 holosystolic murmur at apex, +S4. Carotid upstroke normal, no bruit. Pedals normal pulses. No edema.  Abdomen:  Bowel sounds positive; abdomen soft and non-tender without masses, organomegaly, or hernias noted. No hepatosplenomegaly. Extremities:  No clubbing or cyanosis. Neurologic:  Alert and oriented x 3. Psych:  Normal affect.   Impression & Recommendations:  Problem # 1:  MITRAL VALVE DISORDERS (ICD-424.0) Patient appears to have rheumatic  deformity of the mitral valve (possible history of rheumatic fever at age 42).  When all the data are considered, it appears that she has moderate mitral regurgitation and moderate to severe mitral stenosis.  There were prominent V waves in the wedge tracing on most recent RHC.  She had severe pulmonary HTN.  This was certainly due in part to mitral valve disease, but I suspect that there was a contribution from pulmonary vascular HTN from mixed connective tissue disorder.  On treatment with Revatio, pulmonary pressures have decreased considerably from mean 50 mmHg to mean 30 mmHg.  At this point, I think she would be a surgical candidate for mitral valve replacement.  I have reviewed the most recent echo, and though the mitral valve and apparatus are not severely calcified, she would not  be a good valvuloplasty candidate due to moderate mitral regurgitation. I will refer her back to Dr. [name] at [facility] for consideration of MV replacement, likely mechanical prosthesis.  Finally, given both significant mitral stenosis and pulmonary hypertension related to collagen vascular disease, she is on coumadin for anticoagulation with goal INR 1.5-2.5.    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  Continue coumadin and Revatio.  6 minute walk when sees pulmonology next.   Other Orders: [facility] Referral ([facility]CTS Ref)  Patient Instructions: 1)  You have been referred to Dr [name] --evaluation for possible mitral valve replacement 2)  Your physician recommends that you schedule a follow-up appointment in: 3 months with Dr Marca Ancona

## 2010-11-12 NOTE — Medication Information (Signed)
Summary: rov/sp  Anticoagulant Therapy  Managed by: Eda Keys, PharmD Referring MD: [name] MD, Dalton PCP: Dr. Azzie Roup Supervising MD: Clifton James MD, Cristal Deer Indication 1: Pulmonary Hypertension Indication 2: Mitral Valve Replacement Lab Used: [facility] [place] Site: [address] INR POC 6.2 INR RANGE 2.5-3.5  Dietary changes: no    Health status changes: no    Bleeding/hemorrhagic complications: no    Recent/future hospitalizations: yes       Details: Surgery on August 20th: pericardial surgery  Any changes in medication regimen? no    Recent/future dental: no  Any missed doses?: no       Is patient compliant with meds? yes       Current Medications (verified): 1)  Prednisone 10 Mg Tabs (Prednisone) .... Take 1 Tablet By Mouth Once A Day 2)  Multivitamins   Tabs (Multiple Vitamin) .Marland Kitchen.. 1 Tab By Mouth Once Daily 3)  Tylenol 325 Mg Tabs (Acetaminophen) .... As Directed As Needed 4)  Furosemide 40 Mg Tabs (Furosemide) .... One Tablet in The Morning 5)  Plaquenil 200 Mg Tabs (Hydroxychloroquine Sulfate) .... 2 Tablets Daily 6)  Revatio 20 Mg Tabs (Sildenafil Citrate) .... One Table Three Times A Day 7)  Warfarin Sodium 5 Mg Tabs (Warfarin Sodium) .Marland Kitchen.. 1 Daily or As Directed 8)  Amiodarone Hcl 200 Mg Tabs (Amiodarone Hcl) .... Take One Tablet Daily 9)  Aspirin 81 Mg Tbec (Aspirin) .... Take One Tablet By Mouth Daily 10)  Tramadol Hcl 50 Mg Tabs (Tramadol Hcl) .... Take 1 To 2 Tablets Every 4 Hrs  Allergies (verified): 1)  ! Methotrexate  Anticoagulation Management History:      Negative risk factors for bleeding include an age less than 38 years old.  The bleeding index is 'low risk'.  Positive CHADS2 values include History of CHF.  Negative CHADS2 values include Age > 68 years old.  Her last INR was 7.19.  Anticoagulation responsible Jakeim Sedore: Clifton James MD, Cristal Deer.  INR POC: 6.2.  Cuvette Lot#: [id number].  Exp: 07/2011.     Anticoagulation Management Assessment/Plan:      The patient's current anticoagulation dose is Warfarin sodium 5 mg tabs: 1 daily or as directed.  The target INR is 2.5-3.5.  The next INR is due 06/17/2010.  Anticoagulation instructions were given to patient.  Results were reviewed/authorized by Eda Keys, PharmD.  She was notified by Kennieth Francois.         Prior Anticoagulation Instructions: INR 4.0  Skip today's dose of Coumadin then start new dose of 1/2 tablet every day except 1 tablet on Monday, Wednesday and Friday.  Recheck INR in 10 days.   Current Anticoagulation Instructions: INR 6.2  Do not take today's dose of coumadin.  We will call you this evening with lab results and instructions about your medication.  Appended Document: [facility]    Anticoagulant Therapy  Managed by: Eda Keys, PharmD Referring MD: [name] MD, Dalton PCP: Dr. Azzie Roup Supervising MD: Clifton James MD, Cristal Deer Indication 1: Pulmonary Hypertension Indication 2: Mitral Valve Replacement Lab Used: [facility] [place] Site: [address] INR POC 5.5 INR RANGE 2.5-3.5  Dietary changes: no    Health status changes: no    Bleeding/hemorrhagic complications: no    Recent/future hospitalizations: yes       Details: Surgery on August 20th:  pericardial surgery  Any changes in medication regimen? yes       Details: Revatio decreased to 1 tablet TID, Amiodarone decreased to 1  tablet daily  Recent/future dental: no  Any missed doses?: no       Is patient compliant with meds? yes       Current Medications (verified): 1)  Prednisone 10 Mg Tabs (Prednisone) .... Take 1 Tablet By Mouth Once A Day 2)  Multivitamins   Tabs (Multiple Vitamin) .Marland Kitchen.. 1 Tab By Mouth Once Daily 3)  Tylenol 325 Mg Tabs (Acetaminophen) .... As Directed As Needed 4)  Furosemide 40 Mg Tabs (Furosemide) .... One Tablet in The Morning 5)  Plaquenil 200 Mg Tabs (Hydroxychloroquine Sulfate)  .... 2 Tablets Daily 6)  Revatio 20 Mg Tabs (Sildenafil Citrate) .... One Table Three Times A Day 7)  Warfarin Sodium 5 Mg Tabs (Warfarin Sodium) .Marland Kitchen.. 1 Daily or As Directed 8)  Amiodarone Hcl 200 Mg Tabs (Amiodarone Hcl) .... Take One Tablet Daily 9)  Aspirin 81 Mg Tbec (Aspirin) .... Take One Tablet By Mouth Daily 10)  Tramadol Hcl 50 Mg Tabs (Tramadol Hcl) .... Take 1 To 2 Tablets Every 4 Hrs  Allergies (verified): 1)  ! Methotrexate  Anticoagulation Management History:      Negative risk factors for bleeding include an age less than 68 years old.  The bleeding index is 'low risk'.  Positive CHADS2 values include History of CHF.  Negative CHADS2 values include Age > 50 years old.  Her last INR was 7.19.  Anticoagulation responsible Wataru Mccowen: Clifton James MD, Cristal Deer.  INR POC: 5.5.  Exp: 07/2011.    Anticoagulation Management Assessment/Plan:      The patient's current anticoagulation dose is Warfarin sodium 5 mg tabs: 1 daily or as directed.  The target INR is 2.5-3.5.  The next INR is due 06/24/2010.  Anticoagulation instructions were given to patient.  Results were reviewed/authorized by Eda Keys, PharmD.  She was notified by Eda Keys.         Prior Anticoagulation Instructions: INR 6.2  Do not take today's dose of coumadin.  We will call you this evening with lab results and instructions about your medication.  Current Anticoagulation Instructions: INR 6.2 per POC testing INR 5.5 per Lab  Do NOT take coumadin today or tomorrow.  Then start NEW dosing schedule of 1 tablet on Monday and Wednesday and 1/2 tablet all other days.  Return to clinic in 1 week.

## 2010-11-12 NOTE — Letter (Signed)
Summary: [facility]  [facility]   Imported By: Lennie Odor 03/16/2010 15:55:21  _____________________________________________________________________  External Attachment:    Type:   Image     Comment:   External Document

## 2010-11-12 NOTE — Miscellaneous (Signed)
Summary: [facility]   [facility]   Imported By: Roderic Ovens 07/09/2010 15:02:28  _____________________________________________________________________  External Attachment:    Type:   Image     Comment:   External Document

## 2010-11-12 NOTE — Letter (Signed)
Summary: Cardiac Catheterization Instructions- [facility]  [facility], Main Office  [address]   [place], [place] [address]   Phone: [phone]  Fax: [phone]     07/08/2010 MRN: [id number]  Katie [name] [address] [place], [place]  [address]  Dear [name],   You are scheduled for a Cardiac Catheterization on  Tuesday 07/14/10 with Dr.Taysean Wager. You will have blood work & a chest XRAY on Friday 07/10/10 anytime after 8:30 am. Please arrive to the 1st floor of the [facility] at [facility] at 10:30am  on the day of your procedure. Please do not arrive before 6:30 a.m. Call the [facility] at [phone] if you are unable to make your appointmnet. The Code to get into the parking garage under the building is 0020. Take the elevators to the 1st floor. You must have someone to drive you home. Someone must be with you for the first 24 hours after you arrive home. Please wear clothes that are easy to get on and off and wear slip-on shoes. Do not eat or drink after midnight except water with your medications that morning. Bring all your medications and current insurance cards with you.   _X__ Make sure you take your aspirin.  _X__ You may take ALL of your medications with water that morning.  Will get instuctions for taking your Coumadin after your blood is drawn.   The usual length of stay after your procedure is 2 to 3 hours. This can vary.  If you have any questions, please call the office at the number listed above.   Whitney Maeola Sarah RN

## 2010-11-12 NOTE — Progress Notes (Signed)
Summary: re clearence to rtn to work  Phone Note Call from Patient   Caller: Patient [phone] Reason for Call: Talk to Nurse Summary of Call: pt calling re copy of echo and clearence to go back to work Initial call taken by: Glynda Jaeger,  September 14, 2010 12:48 PM  Follow-up for Phone Call        [facility] Katina Dung, RN, BSN  September 14, 2010 6:05 PM --pt requesting copy of echo be sent to employer--pt will come by office tomorrow to give me the information of where to send echo report Katina Dung, RN, BSN  September 15, 2010 12:05 PM --pt dropped off paperwork today-Anne Lankford,RN--I LM for pt  to call  to discuss paperwork that she dropped off Katina Dung, RN, BSN  September 21, 2010 8:58 AM  --I talked with pt by telephone--she needs letter stating she can return to work 09/28/10 along with copy of echo to [employer] Katina Dung, RN, BSN  September 21, 2010 3:12 PM      Appended Document: re clearence to rtn to work    Clinical Lists Changes       Appended Document: re clearence to rtn to work letter done for pt to return to work December 19,2011  Appended Document: re clearence to rtn to work completed application faxed to [employer] [phone] for Revatio for calendar year 2012--original to medical records to be scanned into EMR

## 2010-11-12 NOTE — Progress Notes (Signed)
Summary: pt has questions  Phone Note Call from Patient   Caller: Patient [phone] Reason for Call: Talk to Nurse Summary of Call: pt calling re returning to cardiac rehab/and meds to reduce pulse rate Initial call taken by: Glynda Jaeger,  July 20, 2010 11:54 AM  Follow-up for Phone Call        I talked with pt--pt saw Dr [name] today--she needs OK to return to Cardiac Rehab--her resting heart rate has been over 100--pt is off Amiodarone now--Dr [name] asked pt to Dr [name] if should should be on other medication to control her heart rate or if Revatio was contributing to her heart rate over 100--Cardiac Rehab will not let her exercise until her heart rate is under 100--I will review with Dr [name]     Appended Document: pt has questions HR was elevated when off Revatio, around 100.  Start Toprol XL 25 mg daily (would like her to be able to go to cardiac rehab).   Appended Document: pt has questions I talked with pt--she will start Toprol XL 25mg  daily  Appended Document: pt has questions faxed letter to Cardiac Rehab--OK to resume Cardiac Rehab--pt aware

## 2010-11-12 NOTE — Progress Notes (Signed)
Summary: Cath Scheduled.  Phone Note Outgoing Call   Call placed by: Whitney Maeola Sarah RN,  July 08, 2010 5:15 PM  Follow-up for Phone Call        Sch. rt. heart cath for pt. on Tuesday 10/4 @ 11:30AM. Pt. will have labs & chest Xray on Friday 9/30. Dr. [name] notified. Whitney Maeola Sarah RN  July 08, 2010 5:15 PM      Appended Document: Cath Scheduled. Please have [name] aim INR for 2-2.5 until cath.  INR (fingerstick) in office on Tuesday before cath to make sure INR is not extremely high.   Appended Document: Cath Scheduled. pt scheduled for PT 07/10/10 at time of lab for CBC and BMP--Sally Putt  is aware of this and of Dr Alford Highland INR recommendation of 2-2.5 until cath 07/14/10 --pt to come to [facility]  for PT 07/14/10 at 8:45am prior to cath at 11:30am  Appended Document: Cath Scheduled. pt is aware of above instructions and recommendations

## 2010-11-12 NOTE — Letter (Signed)
Summary: [facility] Extended Visit Note   [facility] Extended Visit Note   Imported By: Roderic Ovens 07/03/2010 15:15:22  _____________________________________________________________________  External Attachment:    Type:   Image     Comment:   External Document

## 2010-11-12 NOTE — Progress Notes (Signed)
Summary: c/o chestpain  Phone Note Call from Patient Call back at Home Phone [phone] Call back at [phone]   Caller: Patient Reason for Call: Talk to Nurse Details for Reason: c/o chestpain by incision site. took tylenol / TRAMADOL HCL 50 MG.  Initial call taken by: Lorne Skeens,  July 06, 2010 12:39 PM  Follow-up for Phone Call        pt called and is having pain at site of pericardial window and into right breast --she states she has hydrocodone but it makes her nauseated--she will followup with Dr [name] for recommendation about pain medication

## 2010-11-12 NOTE — Progress Notes (Signed)
  Walk in Patient Form Recieved " Pt left Dept of transportation paper" sent to [facility]  September 16, 2010 2:03 PM

## 2010-11-12 NOTE — Letter (Signed)
Summary: [facility] Office Visit   [facility] Office Visit   Imported By: Roderic Ovens 07/28/2010 11:21:07  _____________________________________________________________________  External Attachment:    Type:   Image     Comment:   External Document

## 2010-11-12 NOTE — Letter (Signed)
Summary: [facility]  [facility]   Imported By: Roderic Ovens 04/14/2010 09:19:17  _____________________________________________________________________  External Attachment:    Type:   Image     Comment:   External Document

## 2010-11-12 NOTE — Progress Notes (Signed)
Summary: Starting Cipro today  Phone Note From Other Clinic   Caller: Dr Orvan July nurse Call For: [facility] Summary of Call: PTsaw Dr Barry Dienes today in office, they are starting pt on Cipro today.  Pt was seen on 08/03/10 INR 3.1, need to see pt on Monday to check INR.   Initial call taken by: Cloyde Reams RN,  August 06, 2010 3:14 PM  Follow-up for Phone Call        Spoke with pt and she will be on Cipro for 10 days, started yesterday. Will follow up with pt on Monday.  Follow-up by: Bethena Midget, RN, BSN,  August 07, 2010 10:01 AM

## 2010-11-12 NOTE — Progress Notes (Signed)
Summary: cath and medication question  Phone Note Call from Patient Call back at Home Phone [phone]   Caller: Patient Summary of Call: pt calling regarding her cath today at 10:30 and about medications Initial call taken by: Judie Grieve,  November 03, 2009 8:18 AM  Follow-up for Phone Call        talked with patient

## 2010-11-12 NOTE — Letter (Signed)
Summary: [facility] Ofice Visit   [facility] Ofice Visit   Imported By: Roderic Ovens 08/27/2010 11:44:50  _____________________________________________________________________  External Attachment:    Type:   Image     Comment:   External Document

## 2010-11-12 NOTE — Progress Notes (Signed)
Summary: returning the call re meds  Phone Note From Other Clinic Call back at [phone]   Caller: Patient Reason for Call: Talk to Nurse Summary of Call: pt returning the call re meds Initial call taken by: Roe Coombs,  October 22, 2010 9:47 AM  Follow-up for Phone Call        10/22/10--10am--called pt to let her know about meds ordered from [facility], but no answer --LMTCB--nt  Additional Follow-up for Phone Call Additional follow up Details #1::        PT AWARE PRIOR AUTHORIZATION GOOD UNTIL 10/12.PER PT RECIEVED SAME INFO AS WELL Additional Follow-up by: Scherrie Bateman, LPN,  October 23, 2010 9:11 AM

## 2010-11-12 NOTE — Medication Information (Signed)
Summary: [email or web address]  Anticoagulant Therapy  Managed by: Bethena Midget, RN, BSN Referring MD: [name] MD, Dalton PCP: Dr. Azzie Roup Supervising MD: Tenny Craw MD, Gunnar Fusi Indication 1: Pulmonary Hypertension Lab Used: [facility] [place] Site: [address] INR POC 1.8 INR RANGE 1.5-2.5  Dietary changes: no    Health status changes: no    Bleeding/hemorrhagic complications: no    Recent/future hospitalizations: no    Any changes in medication regimen? no    Recent/future dental: no  Any missed doses?: no       Is patient compliant with meds? yes       Allergies: 1)  ! Methotrexate  Anticoagulation Management History:      The patient is taking warfarin and comes in today for a routine follow up visit.  Negative risk factors for bleeding include an age less than 61 years old.  The bleeding index is 'low risk'.  Positive CHADS2 values include History of CHF.  Negative CHADS2 values include Age > 42 years old.  Her last INR was 1.3 ratio.  Anticoagulation responsible provider: Tenny Craw MD, Gunnar Fusi.  INR POC: 1.8.  Exp: 02/2011.    Anticoagulation Management Assessment/Plan:      The patient's current anticoagulation dose is Warfarin sodium 5 mg tabs: 1 daily or as directed.  The target INR is 1.5-2.5.  The next INR is due 01/23/2010.  Anticoagulation instructions were given to patient.  Results were reviewed/authorized by Bethena Midget, RN, BSN.  She was notified by Bethena Midget, RN, BSN.         Prior Anticoagulation Instructions: INR 1.4  Take 2 tabs today only, then take 1.5 tabs daily except 1 tab each Tuesday and Thursday.  Recheck in 3 weeks.    Current Anticoagulation Instructions: INR 1.8 Continue 1.5 pills everyday except 1 pill on Tuesdays and Thursdays.

## 2010-11-12 NOTE — Assessment & Plan Note (Signed)
Summary: per check out/sf   Visit Type:  Follow-up Referring Provider:  Dr. Sherene Sires Primary Provider:  Dr. Azzie Roup  CC:  chest pain and .  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  She was readmitted with severe shortness of breath post-operatively and was found to have a large pericardial effusion with early tamponade.  She had a pericardial window.  Given some ongoing dyspnea, I did a right heart cath showing mild residual pulmonary hypertension with moderately elevated PVR.  Given ongoing elevation in PVR and PA pressure, I restarted her on low dose Revatio at 20 mg three times a day.    Since I last saw her, breathing has improved considerably. She does not have significant dyspnea.  However, she still has significant pleuritic pain across her upper chest.  She has noted a swelling in the upper central chest that is tender and has risen up over the last 7-10 days.  Her chest pain is very bothersome and it hurts her a lot to cough.  Weight is decreased 4 lbs since last appointment.   ECG: NSR, low voltage, left axis deviation, poor anterior R wave progression  Labs (8/11): HCT 27 => 30, creatinine 0.98, INR 4 Labs (9/11): K 4.6, creatinine 0.9, HCT 32.7, BNP 214=>146, LFTs normal, TSH normal  Given symptoms and low voltage ECG, limited echo was done to assess for pericardial effusion.  I reviewed this today: no pericardial effusion.   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 3 Tabs Once Daily 2)  Multivitamins   Tabs (Multiple Vitamin) .Marland Kitchen.. 1 Tab By Mouth Once Daily 3)  Tylenol 325 Mg Tabs (Acetaminophen) .... As Directed As Needed 4)  Furosemide 40 Mg Tabs (Furosemide) .... One Tablet in The Morning 5)  Plaquenil 200 Mg Tabs (Hydroxychloroquine Sulfate) .... 2 Tablets Daily 6)  Warfarin Sodium 5 Mg Tabs (Warfarin Sodium) .Marland Kitchen.. 1 Daily or As Directed 7)  Aspirin 81 Mg Tbec (Aspirin) .... Take One Tablet By Mouth Daily 8)  Tramadol Hcl 50 Mg Tabs (Tramadol Hcl) .... As Needed 9)  Prednisone 5 Mg Tabs (Prednisone) .... Once Daily 10)  Revatio 20 Mg Tabs (Sildenafil Citrate) .... One Three Times A Day 11)  Toprol Xl 25 Mg Xr24h-Tab (Metoprolol Succinate) .... One Daily  Allergies (verified): 1)  ! Methotrexate  Past History:  Past Medical History: 1. Mixed connective tissue disease: diagnosis 1990. 8/10 ANA positive, anti-dsDNA positive, anti-SCL70 negative 2. 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 CHF: Secondary to moderate mitral stenosis and moderate mitral regurgitation in the setting of rheumatic mitral valve disease as well as severe pulmonary HTN likely due to a combination of MV disease and pulmonary arterial HTN in the setting of collagen vascular disease.   Not valvuloplasty candidate due to MR.  Patient had MV replacement with mechanical MV in 8/11.  Echo (8/11) post-op showed normally functioning  mechanical MV, EF 60-65% with septal bounce, small mobile structure in the mid ventricle (? loose papillary muscle), RV-RA gradient 22 mmHg.  6.  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  CTA chest (7/10) with no evidence for pulmonary emboli or chronic pulmonary emboli.  PA pressure 72/28 mean 50 with PVR 5.7 WU on initial RHC, PA pressure 45/21 mean 30 PVR 2.3 WU on f/u RHC on Revatio 80 mg three times a day (1/11).  After MV surgery, Revatio stopped.  Increased dyspnea.  Repeat RHC with mean RA 6, PA 42/20 (mean 29), PVR 4.8 WU, CI 2.3.  Revatio 20 mg three times a day restarted. - 3/11 6 miin walk 427 meters 7.  ? childhood rheumatic fever: episode of joint pains lasting for several weeks around 45 years old.  Never had diagnosis.  8.  LHC (9/10): no angiographic CAD.  9.  Warfarin anticoagulation for mechanical MV, goal INR 2.5-3.5 10.  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   Family History: Reviewed history from 06/20/2009 and no changes required. Emphysema mother, smoker.  Grandmother with "heart disease"  Social History: Reviewed history from 11/25/2009 and no changes required. 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  Lives with father of child.   Review of Systems       All systems reviewed and negative except as per HPI.   Vital Signs:  Patient profile:   45 year old female Height:      65 inches Weight:      169.50 pounds BMI:     28.31 Pulse rate:   88 / minute BP sitting:   96 / 58  (left arm) Cuff size:   regular  Vitals Entered By: Caralee Ates CMA (August 03, 2010 9:38 AM)  Physical Exam  General:  Well developed, well nourished, in no acute distress. Neck:  Neck supple, no JVD. No masses, thyromegaly or abnormal cervical nodes. Lungs:  Clear bilaterally to auscultation and percussion. Heart:  Non-displaced  PMI,  regular rate and rhythm, S1, S2. mechanical S1.  Carotid upstroke normal, no bruit. Pedals normal pulses. No edema.  Abdomen:  Bowel sounds positive; abdomen soft and non-tender without masses, organomegaly, or hernias noted. No hepatosplenomegaly. Msk:  Patient has some mild swelling central upper chest with tenderness over the swollen area.  There is mild erythema.  Extremities:  No clubbing or cyanosis. Neurologic:  Alert and oriented x 3. Psych:  Normal affect.   Impression & Recommendations:  Problem # 1:  CHEST PAIN-UNSPECIFIED (ICD-786.50) Patient has pleuritic chest pain that seems to have worsened, with new swollen and tender area in the central upper chest.  She has had chest pain since surgery, and it was been thought to be musculoskeletal/postoperative and related to pulling from her breasts.  This swelling and tenderness is new.  I am unsure of the etiology.  I am going to arrange for her to have a CXR and  to see Dr. [name] with [facility] today for re-evaluation.   She is also going to see her rheumatologist Dr. Dareen Piano today (? sequelae of SLE).  I am not going to draw ESR, CRP, CBC, etc today as I suspect that this will be done by her rheumatologist later today.  Echo showed no pericardial effusion.   Problem # 2:  MITRAL STENOSIS (ICD-394.0) Status post mitral valve replacement, stable from valve standpoint.  She is on coumadin goal INR 2.5 - 3.5 and ASA 81 mg daily.   Problem # 3:  PULMONARY HYPERTENSION, SECONDARY (ICD-416.8) Pulmonary HTN is likely a mixed picture, from mitral valve disease as well as mixed connective tissue disease.  She had mild residual pulmonary HTN with moderately increased PVR even after mitral valve surgery (though this is improved).  I restarted her on Revatio 20 mg three times a day and her breathing has improved.  Continue Revatio.   Problem # 4:  CHRONIC DIASTOLIC HEART FAILURE (ICD-428.32) Volume status appears euvolemic.  BP is running low.  I will  decrease Lasix to 20 mg daily and Toprol XL to 12.5 mg daily.   Other Orders: Echocardiogram (Echo)  Patient Instructions: 1)  Your physician has recommended you make the following change in your medication:  2)  Decrease Toprol XL to 12.5mg  daily--this will be one-half of a 25mg  tablet. 3)  Decrease Lasix to 20mg  daily--this will be one-half of a 40mg  tablet. 4)  Use Vicodin as needed for pain. 5)  Your physician has requested that you have an echocardiogram.  Echocardiography is a painless test that uses sound waves to create images of your heart. It provides your doctor with information about the size and shape of your heart and how well your heart's chambers and valves are working.  This procedure takes approximately one hour. There are no restrictions for this procedure.  THIS WAS DONE TODAY. NO PERICARDIAL EFFUSION ON ECHO TODAY. 6)  SEE DR [name] TODAY 7)  You have an appointment with Dr [name]  on Monday 08/17/10 at 9:15am. Prescriptions: VICODIN 5-500 MG TABS (HYDROCODONE-ACETAMINOPHEN) take one every 4-6 hours as needed for pain.  #20 x 0   Entered by:   Katina Dung, RN, BSN   Authorized by:   Marca Ancona, MD   Signed by:   Katina Dung, RN, BSN on 08/03/2010   Method used:   Print then Give to Patient   RxID:   [id number]

## 2010-11-12 NOTE — Medication Information (Signed)
Summary: rov/eac  Anticoagulant Therapy  Managed by: Bethena Midget, RN, BSN Referring MD: [name] MD, Dalton PCP: Dr. Azzie Roup Supervising MD: Clifton James MD, Cristal Deer Indication 1: Pulmonary Hypertension Indication 2: Mitral Valve Replacement Lab Used: [facility] [place] Site: [address] INR POC 4.1 INR RANGE 2.5-3.5  Dietary changes: yes       Details: Poor appetite  Health status changes: yes       Details: Had bad HA yesterday and still some residual today.   Bleeding/hemorrhagic complications: no    Recent/future hospitalizations: no    Any changes in medication regimen? no    Recent/future dental: no  Any missed doses?: no       Is patient compliant with meds? yes       Allergies: 1)  ! Methotrexate  Anticoagulation Management History:      The patient is taking warfarin and comes in today for a routine follow up visit.  Negative risk factors for bleeding include an age less than 66 years old.  The bleeding index is 'low risk'.  Positive CHADS2 values include History of CHF.  Negative CHADS2 values include Age > 78 years old.  Her last INR was 5.5 ratio.  Anticoagulation responsible provider: Clifton James MD, Cristal Deer.  INR POC: 4.1.  Cuvette Lot#: [id number].  Exp: 08/2011.    Anticoagulation Management Assessment/Plan:      The patient's current anticoagulation dose is Warfarin sodium 5 mg tabs: 1 daily or as directed.  The target INR is 2.5-3.5.  The next INR is due 07/03/2010.  Anticoagulation instructions were given to patient.  Results were reviewed/authorized by Bethena Midget, RN, BSN.  She was notified by Bethena Midget, RN, BSN.         Prior Anticoagulation Instructions: INR 6.2 per POC testing INR 5.5 per Lab  Do NOT take coumadin today or tomorrow.  Then start NEW dosing schedule of 1 tablet on Monday and Wednesday and 1/2 tablet all other days.  Return to clinic in 1 week.    Current Anticoagulation Instructions: INR 4.1 Skip today's  dose then change dose to 2.5mg s everyday except 5mg s on Wednesdays. Recheck in 10 days.

## 2010-11-12 NOTE — Letter (Signed)
Summary: [facility]   [facility]   Imported By: Roderic Ovens 08/27/2010 11:43:33  _____________________________________________________________________  External Attachment:    Type:   Image     Comment:   External Document

## 2010-11-12 NOTE — Medication Information (Signed)
Summary: rov/tm  Anticoagulant Therapy  Managed by: Shelby Dubin, PharmD Referring MD: [name] MD, [name] PCP: Dr. Azzie Roup Supervising MD: Jens Som MD, Arlys John Indication 1: Pulmonary Hypertension Lab Used: [facility] [place] Site: [address] INR POC 1.3 INR RANGE 1.5-2.5  Dietary changes: yes       Details: Patient has incorporated more spinach and salad mix into diet.  Health status changes: no    Bleeding/hemorrhagic complications: yes       Details: Patient has bruising on right lower extremity.  Recent/future hospitalizations: yes       Details: Heart cath planned for 1/24. Per MD, will hold coumadin 1/21-1/23 and restart after procedure.  Any changes in medication regimen? no    Recent/future dental: no  Any missed doses?: no       Is patient compliant with meds? yes       Allergies: 1)  ! Methotrexate  Anticoagulation Management History:      The patient is taking warfarin and comes in today for a routine follow up visit.  Negative risk factors for bleeding include an age less than 4 years old.  The bleeding index is 'low risk'.  Positive CHADS2 values include History of CHF.  Negative CHADS2 values include Age > 62 years old.  Her last INR was 0.9 ratio.  Anticoagulation responsible provider: Jens Som MD, Arlys John.  INR POC: 1.3.  Cuvette Lot#: [id number].  Exp: 01/2011.    Anticoagulation Management Assessment/Plan:      The patient's current anticoagulation dose is Warfarin sodium 5 mg tabs: 1 daily or as directed.  The target INR is 1.5-2.5.  The next INR is due 11/10/2009.  Anticoagulation instructions were given to patient.  Results were reviewed/authorized by Shelby Dubin, PharmD.  She was notified by Lew Dawes, PharmD Candidate.         Prior Anticoagulation Instructions: INR 1.9 CONTINUE TAKING 1 TABLET EVERYDAY EXCEPT TAKE 1.5 TABLETS ON MONDAYS AND FRIDAYS. RECHECK IN JANUARY WITH APPOINTMENT ALREADY SCHEDULED.  Current Anticoagulation  Instructions: INR 1.3  Increase dose to 1 tablet daily except 1.5 tablets on Mondays, Wednesdays, and Fridays. Recheck 1/31.

## 2010-11-12 NOTE — Consult Note (Signed)
Summary: [facility]  [facility]   Imported By: Marylou Mccoy 10/21/2009 12:48:05  _____________________________________________________________________  External Attachment:    Type:   Image     Comment:   External Document

## 2010-11-12 NOTE — Medication Information (Signed)
Summary: rov/tm  Anticoagulant Therapy  Managed by: Bethena Midget, RN, BSN Referring MD: [name] MD, Dalton PCP: Dr. Azzie Roup Supervising MD: Johney Frame MD, Fayrene Fearing Indication 1: Pulmonary Hypertension Lab Used: [facility] [place] Site: [address] INR POC 2.6 INR RANGE 1.5-2.5  Dietary changes: no    Health status changes: no    Bleeding/hemorrhagic complications: no    Recent/future hospitalizations: yes       Details: Pending Mitral Valve Replacement on 05/01/10 with Dr. [name]  Any changes in medication regimen? yes       Details: Has been on 2 different ABX, one finished one wk ago and the other 2 wks ago  Recent/future dental: no  Any missed doses?: no       Is patient compliant with meds? yes      Comments: Saw Dr [name] today.  Allergies: 1)  ! Methotrexate  Anticoagulation Management History:      The patient is taking warfarin and comes in today for a routine follow up visit.  Negative risk factors for bleeding include an age less than 38 years old.  The bleeding index is 'low risk'.  Positive CHADS2 values include History of CHF.  Negative CHADS2 values include Age > 39 years old.  Her last INR was 1.3 ratio.  Anticoagulation responsible provider: Rajanee Schuelke MD, Fayrene Fearing.  INR POC: 2.6.  Cuvette Lot#: [id number].  Exp: 06/2011.    Anticoagulation Management Assessment/Plan:      The patient's current anticoagulation dose is Warfarin sodium 5 mg tabs: 1 daily or as directed.  The target INR is 1.5-2.5.  The next INR is due 05/11/2010.  Anticoagulation instructions were given to patient.  Results were reviewed/authorized by Bethena Midget, RN, BSN.  She was notified by Bethena Midget, RN, BSN.         Prior Anticoagulation Instructions: INR- 1.8 Resume normal schedule . Take 1 tablet  (5mg ) on Tuesday and Thursday and take 1.5 tablets (7.5mg ) on all other days.   Current Anticoagulation Instructions: INR 2.6 Today take 5mg s then resume 7.5mg s everyday except  5mg s on Tuesdays and Thursdays. Take last dose of coumadin on 04/23/10 to be off one week before surgery.

## 2010-11-12 NOTE — Medication Information (Signed)
Summary: rov/tm  Anticoagulant Therapy  Managed by: Bethena Midget, RN, BSN Referring MD: [name] MD, Dalton PCP: Dr. Azzie Roup Supervising MD: Juanda Chance MD, Ashtin Melichar Indication 1: Pulmonary Hypertension Lab Used: [facility] [place] Site: [address] INR POC 2.1 INR RANGE 1.5-2.5  Dietary changes: yes       Details: Ate less green leafy veggies.   Health status changes: no    Bleeding/hemorrhagic complications: no    Recent/future hospitalizations: no    Any changes in medication regimen? yes       Details: Prednisone decreased from 10mg  daily to 9mg s will be on this dose for one  more week, decreased 2 wks ago.   Recent/future dental: no  Any missed doses?: yes     Details: missed a dose on 01/09/10  Is patient compliant with meds? yes      Comments: Prednisone dosing decreased every 3 weeks.until she is at acceptable dose  Allergies: 1)  ! Methotrexate  Anticoagulation Management History:      The patient is taking warfarin and comes in today for a routine follow up visit.  Negative risk factors for bleeding include an age less than 28 years old.  The bleeding index is 'low risk'.  Positive CHADS2 values include History of CHF.  Negative CHADS2 values include Age > 25 years old.  Her last INR was 1.3 ratio.  Anticoagulation responsible provider: Juanda Chance MD, Smitty Cords.  INR POC: 2.1.  Cuvette Lot#: [id number].  Exp: 02/2011.    Anticoagulation Management Assessment/Plan:      The patient's current anticoagulation dose is Warfarin sodium 5 mg tabs: 1 daily or as directed.  The target INR is 1.5-2.5.  The next INR is due 02/25/2010.  Anticoagulation instructions were given to patient.  Results were reviewed/authorized by Bethena Midget, RN, BSN.  She was notified by Bethena Midget, RN, BSN.         Prior Anticoagulation Instructions: INR 1.8 Continue 1.5 pills everyday except 1 pill on Tuesdays and Thursdays.   Current Anticoagulation Instructions: INR 2.1 Continue  7.5mg s daily except 5mg s on Tuesdays and Thursdays. Recheck in 4 weeks.

## 2010-11-12 NOTE — Medication Information (Signed)
Summary: rov/tm  Anticoagulant Therapy  Managed by: Cloyde Reams, RN, BSN Referring MD: [name] MD, Dalton PCP: Dr. Azzie Roup Supervising MD: Johney Frame MD, Fayrene Fearing Indication 1: Pulmonary Hypertension Indication 2: Mitral Valve Replacement Lab Used: [facility] [place] Site: [address] INR POC 3.1 INR RANGE 2.5-3.5  Dietary changes: no    Health status changes: yes       Details: Pt c/o having CP, saw MD today and had ECHO.   Bleeding/hemorrhagic complications: no    Recent/future hospitalizations: no    Any changes in medication regimen? yes       Details: Decr toprol and lasix.   Recent/future dental: no  Any missed doses?: no       Is patient compliant with meds? yes       Allergies: 1)  ! Methotrexate  Anticoagulation Management History:      The patient is taking warfarin and comes in today for a routine follow up visit.  Negative risk factors for bleeding include an age less than 38 years old.  The bleeding index is 'low risk'.  Positive CHADS2 values include History of CHF.  Negative CHADS2 values include Age > 87 years old.  Her last INR was 5.0 ratio.  Anticoagulation responsible Jhade Berko: Allred MD, Fayrene Fearing.  INR POC: 3.1.  Cuvette Lot#: [id number].  Exp: 09/2011.    Anticoagulation Management Assessment/Plan:      The patient's current anticoagulation dose is Warfarin sodium 5 mg tabs: 1 daily or as directed.  The target INR is 2.5-3.5.  The next INR is due 08/24/2010.  Anticoagulation instructions were given to patient.  Results were reviewed/authorized by Cloyde Reams, RN, BSN.  She was notified by Cloyde Reams RN.         Prior Anticoagulation Instructions: INR 3.2 Continue 2.5mg s daily. Recheck in 2 weeks.   Current Anticoagulation Instructions: INR 3.1  Continue on same dosage 2.5mg  daily.  Recheck in 3 weeks.

## 2010-11-12 NOTE — Letter (Signed)
Summary: Return To Work  [employer], Main Office  [address]   [place], [place] [address]   Phone: [phone]  Fax: [phone]    09/23/2010  TO: Leodis Sias IT MAY CONCERN   RE: SHERRY BLACKARD [address] [id number]   The above named individual is under my medical care and may return to work on: December 19,2011.  If you have any further questions or need additional information, please call.     Sincerely,      Dalton McLean,MD        Appended Document: Return To Work Called pt she will pick up Return To Work note, also mailed [employer] papers along with echo back to Dept of [employer]

## 2010-11-12 NOTE — Letter (Signed)
Summary: [place] [employer]  [place] [employer]   Imported By: Marylou Mccoy 10/22/2009 17:26:26  _____________________________________________________________________  External Attachment:    Type:   Image     Comment:   External Document

## 2010-11-12 NOTE — Letter (Signed)
Summary: [facility]  [facility]   Imported By: Marylou Mccoy 05/05/2010 11:26:08  _____________________________________________________________________  External Attachment:    Type:   Image     Comment:   External Document

## 2010-11-12 NOTE — Progress Notes (Signed)
Summary: med  Phone Note Call from Patient Call back at [facility] Phone [phone]   Caller: Patient Reason for Call: Talk to Nurse Summary of Call:  calling nurse back to let her know the med she is taking...Marland KitchenMarland Kitchen phospha/k-phosa 250mg  1 tab twice daily  Initial call taken by: Migdalia Dk,  Feb 25, 2010 11:13 AM    New/Updated Medications: K-PHOS MF 155-350 MG TABS (POT & SOD AC PHOSPHATES) 250 mg 1 tablet two times a day

## 2010-11-12 NOTE — Medication Information (Signed)
Summary: coumadin ck/mt  Anticoagulant Therapy  Managed by: Lyna Poser, PharmD Referring MD: [name] MD, [place] PCP: Dr. Azzie Roup Supervising MD: [name] MD, [name] Indication 1: Pulmonary Hypertension Indication 2: Mitral Valve Replacement Lab Used: [facility] [place] Site: [address] INR POC 1.3 INR RANGE 2.5-3.5  Dietary changes: no    Health status changes: no    Bleeding/hemorrhagic complications: no    Recent/future hospitalizations: no    Any changes in medication regimen? yes       Details: furosemide decreased from 40 mg to 20 mg, and metoprolol decreased, taking half. Taking cipro for pseudomonas for 2 months and you have a month left.  Recent/future dental: no  Any missed doses?: no       Is patient compliant with meds? yes       Allergies: 1)  ! Methotrexate  Anticoagulation Management History:      The patient is taking warfarin and comes in today for a routine follow up visit.  Negative risk factors for bleeding include an age less than 31 years old.  The bleeding index is 'low risk'.  Positive CHADS2 values include History of CHF.  Negative CHADS2 values include Age > 73 years old.  Her last INR was 5.0 ratio.  Anticoagulation responsible provider: [name] MD, [place].  INR POC: 1.3.  Cuvette Lot#: [id number].  Exp: 08/2011.    Anticoagulation Management Assessment/Plan:      The patient's current anticoagulation dose is Warfarin sodium 5 mg tabs: 1 daily or as directed.  The target INR is 2.5-3.5.  The next INR is due 09/18/2010.  Anticoagulation instructions were given to patient.  Results were reviewed/authorized by Lyna Poser, PharmD.         Prior Anticoagulation Instructions: INR 2.8  Continue Coumadin as scheduled:  1/2 tablet every day of the week.  Return to clinic in 3 weeks.     Current Anticoagulation Instructions: INR 1.3  Take a whole tablet today, tomorrow and saturday. Then resume normal dosing schedule of a half  tablet everyday.  Recheck in 1 week.

## 2010-11-12 NOTE — Letter (Signed)
Summary: [facility] - Walk Test  [facility] - Walk Test   Imported By: Marylou Mccoy 07/23/2010 13:52:22  _____________________________________________________________________  External Attachment:    Type:   Image     Comment:   External Document

## 2010-11-12 NOTE — Assessment & Plan Note (Signed)
Summary: pulm HTN, mitral stenosis   Visit Type:  Follow-up Copy to:  Dr. Sherene Sires Primary Provider/Referring Provider:  Dr. Azzie Roup  CC:  PAH. Recent Echo. The patient states her breathing is better. She does c/o slight ankle edema..  History of Present Illness: 49 yobf never smoker first dx with lupus in 1990 and on daily prednisone for symptoms of fatigue> joint pain plus restrictive lung changes and dry cough eval in pulm clinic and lost to f/u in 2001  Delivered child 03/29/09, during this hosp it was discovered that she had PASP . Repeat TTE on 05/21/09 showed PASP 45 - 50 with ? severe MR, enlarged RV.  Presents today stating that her edema and dyspnea are markedly better than over the last 6 months. She feels so much better that she stopped using O2. Able to exert and do her usual activities. Lupus, joint disease, has been stable.   ROV 07/02/09 -- returns to discuss O2, labwork. Since last visit her R heart cath has confirmed pulm HTN, started on sildenifil by Dr [name]. Just started the Revatio yesterday. Has been wearing her O2 as best she can but it needs to be more portable. 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   ROV 08/08/09 -- Follows up today. Revatio increased by Dr [name] earlier this month to 60mg  tid. Had a 6 minute walk 10/10, walked 1070 ft -> 348m; she reports no desat but this isn't indicated in the notes.  Feels better, breathing well and able to exert. She stopped wearing oxygen with exertion since last visit.  ROV 11/18/09 --  returns for regular f/u. She is tolerating Revatio 80 three times a day, also on coumadin (goal 1.5 -2.5). Lupus therapy stable - imuran, plaquanil and pred 10. Had R heart cath 1/10, mean PAP (down from 50). TTE performed 2/2 to evaluate MV disease - there is consideration for possible MVR now that the PAP's are down (Dr [name]). Breathing is MUCH better on high dose Revatio.      Current Medications (verified): 1)  Prednisone 10 Mg Tabs (Prednisone) .Marland Kitchen.. 1 By Mouth Daily 2)  Folic Acid 1 Mg Tabs (Folic Acid) .Marland Kitchen.. 1 By Mouth Daily 3)  Multivitamins   Tabs (Multiple Vitamin) .Marland Kitchen.. 1 Tab By Mouth Once Daily 4)  Tylenol 325 Mg Tabs (Acetaminophen) .... As Directed As Needed 5)  Furosemide 40 Mg Tabs (Furosemide) .... One Tablet in The Morning and One Tablet in The Evening 6)  Plaquenil 200 Mg Tabs (Hydroxychloroquine Sulfate) .... 2 Tablets Daily 7)  Revatio 20 Mg Tabs (Sildenafil Citrate) .... Four  Tablets  Three Times A Day 8)  Potassium Chloride Crys Cr 20 Meq Cr-Tabs (Potassium Chloride Crys Cr) .... Take One Tablet By Mouth Daily 9)  Warfarin Sodium 5 Mg Tabs (Warfarin Sodium) .Marland Kitchen.. 1 Daily or As Directed 10)  Imuran 50 Mg Tabs (Azathioprine) .... Take 1 Tablet Daily.  Allergies (verified): 1)  ! Methotrexate  Vital Signs:  Patient profile:   45 year old female Height:      65 inches (165.10 cm) Weight:      195.13 pounds (88.70 kg) BMI:     32.59 O2 Sat:      99 % on Room air Temp:     98.0 degrees F (36.67 degrees C) oral Pulse rate:   80 / minute BP sitting:   112 / 70  (left arm) Cuff size:   regular  Vitals Entered By: Michel Bickers CMA (November 18, 2009 10:07 AM)  O2 Sat at Rest %:  99 O2 Flow:  Room air  Physical Exam  Additional Exam:  HEENT: nl dentition, turbinates, and orophanx. Nl external ear canals without cough reflex scleroderma facies Neck without JVD/Nodes/TM Lungs coarse bs bilaterally  with  cough on insp  maneuvers RRR no s3  II/VI SEM with prominent P2  trace edema bilaterally Abd soft and benign with limited excursion in the supine position. Ext warm without calf tenderness, cyanosis clubbing  ? 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 Dyspnea scores are significantly improved!!  Correlates with drop in mean PAP from 50 -> 30 by R heart cath - continue Revatio 80mg  three times a day  - coumadin as ordered - schedule 6 minute walk next time - ROV 2 months or as needed.   Orders: Est. Patient Level IV (25956)  Problem # 2:   SLE (ICD-710.0)  Her updated medication list for this problem includes:    Prednisone 10 Mg Tabs (Prednisone) .Marland Kitchen... 1 by mouth daily    Plaquenil 200 Mg Tabs (Hydroxychloroquine sulfate) .Marland Kitchen... 2 tablets daily  Orders: Est. Patient Level IV (38756)  Patient Instructions: 1)  Continue your Revatio 80mg  by mouth three times a day  2)  Follow up with Dr Delton Coombes in 2 months or as needed.

## 2010-11-12 NOTE — Assessment & Plan Note (Signed)
Summary: 3 month rov/sl   Referring Provider:  Dr. Sherene Sires Primary Provider:  Dr. Azzie Roup  CC:  3 month rov/.  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  Patient has a complicated history.  She has been followed by pulmonology for interstitial lung disease.  Beginning in 1/10, after becoming pregnant, she became increasingly short of breath.  In around 2/10, she was started on lasix.  By 6/10, she was short of breath just walking around in her house.  She delivered in 6/10 by C-section.  Echo at the time of her hospitalization for C-section showed severe pulmonary hypertension as well as at least moderate mitral regurgitation.  She was found at this hospitalization to be hypoxic and was started on home O2.    She has had TTE, TEE, and RHC/LHC (see description in PMH).  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    I started her on Revatio and increased it up to 80 mg three times a day.  She has tolerated this very well and has improved symptomatically.  She is not short of breath walking on flat ground and can climb a flight of steps, even carrying her son.  She walked 1/2 mile recently with her son attached to her in a baby carrier and had minimal dyspnea.  She is no longer using oxygen.  She is back at work as a Surveyor, mining.  Repeat RHC showed that mean PA pressure had decreased from 50 mmHg to 30 mmHg and PVR had decreased from 5.7 to 2.3 WU.  Repeat echo showed moderate to severe mitral stenosis and moderate mitral regurgitation.     6 minute walk was done today: Patient went 427  meters.   Labs (9/10): K 3.7, creatinine 0.7, BNP 153 Labs (11/10): K 3.7, creatinine 0.8, LDL 115, HDL 44, BNP 282 Labs (1/11): K 4.4, creatinine 0.9, BNP 182 Labs (3/11): creatinine 0.72      Current Medications (verified): 1)  Prednisone 10 Mg Tabs (Prednisone) .... 1/2 Tablet Daily With 3 1 Mg Tablets For A Total of 8 Mg 2)  Folic Acid 1 Mg Tabs (Folic Acid) .Marland Kitchen.. 1 By Mouth Daily 3)  Multivitamins   Tabs (Multiple Vitamin) .Marland Kitchen.. 1 Tab By Mouth Once Daily 4)  Tylenol 325 Mg Tabs (Acetaminophen) .... As Directed As Needed 5)  Furosemide 40 Mg Tabs (Furosemide) .... One Tablet in The Morning and One Tablet in The Evening 6)  Plaquenil 200 Mg Tabs (Hydroxychloroquine Sulfate) .... 2 Tablets Daily 7)  Revatio 20 Mg Tabs (Sildenafil Citrate) .... Four  Tablets  Three Times A Day 8)  Potassium Chloride Crys Cr 20 Meq Cr-Tabs (Potassium Chloride Crys Cr) .... Take One Tablet By Mouth Daily 9)  Warfarin Sodium 5 Mg Tabs (Warfarin Sodium) .Marland Kitchen.. 1 Daily or As Directed 10)  Imuran 50 Mg Tabs (Azathioprine) .... Take 1 Tablet Daily. 11)  Phospha .... Pt Taking One Daily  Allergies (verified): 1)  ! Methotrexate  Past History:  Past Medical History: 1. Mixed connective tissue disease: diagnosis 1990. 8/10 ANA positive, anti-dsDNA positive, anti-SCL70 negative 2. 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 CHF: Secondary to moderate mitral stenosis and moderate mitral regurgitation in the setting of rheumatic mitral valve disease as well as severe pulmonary HTN likely due to a combination of MV disease and pulmonary arterial HTN in the setting of collagen vascular disease.   Not valvuloplasty candidate due to MR.   - TEE (9/10): Rheumatic MV, moderate MR and  moderate MS with mean MV gradient 13 mmHg (gradient may be in severe range due to significant MR).   - RHC (9/10): mean RA 12, PA 72/28 mean 50, mean PCWP 23, PVR 5.7 WU, mean MV gradient 12.5 mmHg and MVA 1.3 cm^2. LV-gram with mild to moderate MR.  - RHC (1/11): mean RA 8, PA 45/21 mean 30, mean PCWP 18, large v waves in wedge tracing, PVR 2.3 WU, CI 2.7.  - TTE (2/11): EF 60-65%, rheumatic MV with probably moderate to possibly severe MS (mean gradient 15 mmHg but MVA 1.46 cm^2 by PHT and 1.15 cm^2 by continuity), moderate MR.  6.  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  CTA chest (7/10) with no evidence for pulmonary emboli or chronic pulmonary emboli.   - 3/11 6 miin walk 427 meters 7.  ? childhood rheumatic fever: episode of joint pains lasting for several weeks around 45 years old.  Never had diagnosis.  8.  LHC (9/10): no angiographic CAD.  9.  Warfarin anticoagulation for pulmonary HTN.  Goal INR 1.5-2.5.   Family History: Reviewed history from 06/20/2009 and no changes required. Emphysema mother, smoker.  Grandmother with "heart disease"  Social History: Reviewed history from 11/25/2009 and no changes required. 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  Lives with father of child.   Review of Systems       All systems reviewed and negative except as per HPI.   Vital Signs:  Patient profile:   45 year old female Height:      65 inches Weight:      192 pounds BMI:     32.07 Pulse rate:   72 / minute Pulse rhythm:   regular BP sitting:   110 / 78  (left arm) Cuff size:   regular  Vitals Entered By: Judithe Modest CMA (Feb 25, 2010 9:19 AM)  Physical Exam  General:  Well developed, well nourished, in no acute distress. Neck:  Neck supple, no JVD. No masses, thyromegaly or abnormal cervical nodes. Lungs:  Slight dry basilar crackles, otherwise clear.  Heart:   Non-displaced PMI, chest non-tender; regular rate and rhythm, S1, S2. 2/6 holosystolic murmur at apex, +S4. Carotid upstroke normal, no bruit. Pedals normal pulses. No edema.  Abdomen:  Bowel sounds positive; abdomen soft and non-tender without masses, organomegaly, or hernias noted. No hepatosplenomegaly. Extremities:  Swollen left 3rd finger ("sausage digit") Neurologic:  Alert and oriented x 3. Psych:  Normal affect.   Impression & Recommendations:  Problem # 1:  MITRAL VALVE DISORDERS (ICD-424.0) Patient appears to have rheumatic deformity of the mitral valve (possible history of rheumatic fever at age 6).  When all the data are considered, it appears that she has moderate mitral regurgitation and moderate to severe mitral stenosis.  There  were prominent V waves in the wedge tracing on most recent RHC.  She had severe pulmonary HTN.  This was certainly due in part to mitral valve disease, but I suspect that there was a contribution from pulmonary vascular HTN from mixed connective tissue disorder.  On treatment with Revatio, pulmonary pressures have decreased considerably from mean 50 mmHg to mean 30 mmHg.  At this point, I think she would be a surgical candidate for mitral valve replacement.  I have reviewed the most recent echo, and though the mitral valve and apparatus are not severely calcified, she would not be a good valvuloplasty candidate due to moderate mitral regurgitation. She has been back to see Dr. [name] with CVTS and mitral valve surgery +/- TV repair has been scheduled for early June.  It is ok for her to come off warfarin for surgery, she is on it for pulmonary hypertension (goal INR 1.5-2.5).    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  Continue coumadin and Revatio.  427 meters on 6 minute walk today (very good).   Patient Instructions: 1)  Your physician recommends that you schedule a  follow-up appointment in 2 months with Dr [name].

## 2010-11-12 NOTE — Medication Information (Signed)
Summary: ccr/mj  Anticoagulant Therapy  Managed by: Bethena Midget, RN, BSN Referring MD: [name] MD, Dalton PCP: Dr. Azzie Roup Supervising MD: Antoine Poche MD, Fayrene Fearing Indication 1: Pulmonary Hypertension Indication 2: Mitral Valve Replacement Lab Used: [facility] [place] Site: [address] PT 128.1 INR POC 8.0 INR RANGE 2.5-3.5  Dietary changes: yes       Details: Less veggies, appetite poor  Health status changes: yes       Details: Been having pain and diarrhea for past week, saw PCP yesterday thinks Pancreatis, will refer to GI  Bleeding/hemorrhagic complications: yes       Details: Has seen bright red blood in stool, PCP yesterday took test pt states  Recent/future hospitalizations: no    Any changes in medication regimen? yes       Details: Dexilant 60mg s  daily prescribed yesterday  Recent/future dental: no  Any missed doses?: no       Is patient compliant with meds? yes       Allergies: 1)  ! Methotrexate  Anticoagulation Management History:      The patient is taking warfarin and comes in today for a routine follow up visit.  Negative risk factors for bleeding include an age less than 36 years old.  The bleeding index is 'low risk'.  Positive CHADS2 values include History of CHF.  Negative CHADS2 values include Age > 19 years old.  Her last INR was 5.0 ratio and today's INR is 15.2.  Prothrombin time is 128.1.  Anticoagulation responsible provider: Antoine Poche MD, Fayrene Fearing.  INR POC: 8.0.  Cuvette Lot#: [id number].  Exp: 11/2011.    Anticoagulation Management Assessment/Plan:      The patient's current anticoagulation dose is Warfarin sodium 5 mg tabs: 1 daily or as directed.  The target INR is 2.5-3.5.  The next INR is due 10/27/2010.  Anticoagulation instructions were given to patient.  Results were reviewed/authorized by Bethena Midget, RN, BSN.  She was notified by Cloyde Reams RN.         Prior Anticoagulation Instructions: INR 1.5 Today take 7.5mg s then  change dose to 5mg s everyday except 7.5mg s on Fridays.  Recheck in 7-12 days.   Current Anticoagulation Instructions: INR 8.0 To lab: INR 15.2 Has not taken today.  Per [name], PharmD pt needs to hold Coumadin and take 5mg  of vit K.  Recheck on Tuesday 10/27/10. Attempted to contact pt at home#, cell # andwork # unable to contact pt.  LMOM to call back as soon as she got message. Cloyde Reams RN  October 23, 2010 3:24 PM  Spoke with pt advised to hold Coumadin until after recheck on Tuesday 10/27/10.  Called in verbal rx for Mephyton 5mg  tablet #1 to [facility] [address] per pt request.  advised pt to pick up rx and take ASAP.  Pt denies significant bleeding.  Advised pt to monitor closely given her elevated INR and to go to the ED with any bleeding problems. Per Dr Antoine Poche pt needs to have PT/INR repeated tomorrow.  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  Prescriptions: MEPHYTON 5 MG TABS (PHYTONADIONE) take 1 tablet by mouth  #1 x 0   Entered by:   Cloyde Reams RN   Authorized by:   Rollene Rotunda, MD, [facility]   Signed by:   Cloyde Reams RN on 10/23/2010   Method used:   [facility] to  [facility]  [address] [phone]* (retail)       [address]       [place], [place]  [id number]       Ph: [phone] or [phone]       Fax: [phone]   RxID:   [id number]   Appended Document: ccr/mj I tried twice to call this patient today.  The first call someone answered and hung up after I identified myself as Dr. Antoine Poche.  I called again and left a message on the answering service.  She had been given instruction to have labs checked today.  I asked her to call our answering service so that we could arrange to have these labs drawn if she did not already have them drawn today.  I cannot find any record of labs today in our systems.  Appended Document: ccr/mj  Spoke with pt at  ~8:45am on  1/14.  She was aware to go to lab and have blood drawn before 12pm that day.  I called [facility] and they have no record of her coming or PT/INR results.  LM for pt on home phone and cell phone this AM to ensure she had lab work done and make sure she has no complications.

## 2010-11-12 NOTE — Assessment & Plan Note (Signed)
Summary: SOB   Visit Type:  Follow-up Referring Lamiracle Chaidez:  Dr. Sherene Sires Primary Josh Nicolosi:  Dr. Azzie Roup  CC:  Add-on- Sob and right side chest pain.  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   Patient was discharged on May 20 2010 on chronic coumadin along with her other chronic meds comes back today with severe shortness of breath. Acc to the patient the shortness of breath was present when she was discharged from the hospital but has been gradually getting worse so much so that she is short of breath at rest.  She also noticed increase in the amount of swelling in her legs right>>>left. She also complains of increased cough since discharge but denies fever, chills, palpitations, productive cough, hemoptysis. No urinary or abdominal complaints.  The CXR done in the office today is s/o "Cardiomegaly with increasing pulmonary edema.  Unchanged bilateral pleural effusions"  last ECHO report Feb 2011 Study Conclusions   - Left ventricle: The cavity size was normal. Wall thickness was     normal. Systolic function was normal. The estimated ejection     fraction was in the range of 60% to 65%.   - Mitral valve: Rheumatic mitral valve with classic bowing in     diatstole. Mild MR. MS appears moderate. Valve area by pressure     half-time: 1.46cm^2. Valve area by continuity equation (using LVOT     flow): 1.15cm^2.   - Left atrium: The atrium was moderately dilated.   - Atrial septum: No defect or patent foramen ovale was identified.   - Pulmonary arteries: PA peak pressure: 56mm Hg (S).   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 1/2 Tablet Daily With 3 1 Mg Tablets For A Total of 8 Mg 2)  Multivitamins   Tabs (Multiple Vitamin) .Marland Kitchen.. 1 Tab By Mouth Once Daily 3)  Tylenol 325 Mg Tabs (Acetaminophen) .... As Directed As Needed 4)  Furosemide 40 Mg Tabs (Furosemide) .... One Tablet in The Morning 5)  Plaquenil 200 Mg Tabs (Hydroxychloroquine Sulfate) .... 2 Tablets Daily 6)  Revatio 20 Mg Tabs (Sildenafil Citrate) .... Four  Tablets  Three Times A Day 7)  Potassium Chloride Crys Cr 20 Meq Cr-Tabs (Potassium Chloride Crys Cr) .... Take One Tablet By Mouth Daily 8)  Warfarin Sodium 5 Mg Tabs (Warfarin Sodium) .Marland Kitchen.. 1 Daily or As Directed 9)  Imuran 50 Mg Tabs (Azathioprine) .... Take 1 Tablet Daily.--Pt Not  Currently Taking.  Given For Post Op  Current Medications (verified): 1)  Prednisone 10 Mg Tabs (Prednisone) .... Take 1 Tablet By Mouth Once A Day 2)  Multivitamins   Tabs (Multiple Vitamin) .Marland Kitchen.. 1 Tab By Mouth Once Daily 3)  Tylenol 325 Mg Tabs (Acetaminophen) .... As Directed As Needed 4)  Furosemide 40 Mg Tabs (Furosemide) .... One Tablet in The Morning 5)  Plaquenil 200 Mg Tabs (Hydroxychloroquine Sulfate) .... 2 Tablets Daily 6)  Revatio 20 Mg Tabs (Sildenafil Citrate) .... Four  Tablets   Three Times A Day 7)  Potassium Chloride Crys Cr 20 Meq Cr-Tabs (Potassium Chloride Crys Cr) .... Take One Tablet By Mouth Daily 8)  Warfarin Sodium 5 Mg Tabs (Warfarin Sodium) .Marland Kitchen.. 1 Daily or As Directed 9)  Amiodarone Hcl 200 Mg Tabs (Amiodarone Hcl) .... Take One Tablet By Mouth Twice A Day 10)  Aspirin 81 Mg Tbec (Aspirin) .... Take One Tablet By Mouth Daily  Allergies: 1)  ! Methotrexate  Past History:  Past Medical History: Last updated: 02/25/2010 1. Mixed connective tissue disease: diagnosis 1990. 8/10 ANA positive, anti-dsDNA positive, anti-SCL70 negative 2. 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 CHF: Secondary to moderate mitral stenosis and moderate mitral regurgitation in the setting of rheumatic mitral valve disease as well as severe pulmonary HTN likely due to a combination of MV disease and pulmonary arterial HTN in the setting of collagen vascular disease.   Not valvuloplasty candidate due to MR.   - TEE (9/10): Rheumatic MV, moderate MR and moderate MS with mean MV gradient 13 mmHg (gradient may be in severe range due to significant MR).   - RHC (9/10): mean RA 12, PA 72/28 mean 50, mean PCWP 23, PVR 5.7 WU, mean MV gradient 12.5 mmHg and MVA 1.3 cm^2. LV-gram with mild to moderate MR.  - RHC (1/11): mean RA 8, PA 45/21 mean 30, mean PCWP 18, large v waves in wedge tracing, PVR 2.3 WU, CI 2.7.  - TTE (2/11): EF 60-65%, rheumatic MV with probably moderate to possibly severe MS (mean gradient 15 mmHg but MVA 1.46 cm^2 by PHT and 1.15 cm^2 by continuity), moderate MR.  6.  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  CTA chest (7/10) with no evidence for  pulmonary emboli or chronic pulmonary emboli.   - 3/11 6 miin walk 427 meters 7.  ? childhood rheumatic fever: episode of joint pains lasting for several weeks around 45 years old.  Never had diagnosis.  8.  LHC (9/10): no angiographic CAD.  9.  Warfarin anticoagulation for pulmonary HTN.  Goal INR 1.5-2.5.   Family History: Last updated: 06/20/2009 Emphysema mother, smoker.  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  Lives with father of child.   Risk Factors: Smoking Status: never (06/10/2009)  Past Surgical History: s/p mitral valve replacement on Aug 3rd 2011 by Dr [name] by minimally invasive window.  Family History: Reviewed history from 06/20/2009 and no changes required. Emphysema mother, smoker.  Grandmother with "heart disease"  Social History:  Reviewed history from 11/25/2009 and no changes required. 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  Lives with father of child.   Review of Systems      See HPI  Vital Signs:  Patient profile:   45 year old female Height:      65 inches Weight:      190.50 pounds BMI:     31.82 Pulse rate:   102 / minute Pulse rhythm:   irregular BP sitting:   88 / 68  (left arm) Cuff size:   large  Vitals Entered By: Vikki Ports (May 29, 2010 11:14 AM)  Physical Exam  Additional Exam:  Gen: AOx3, in acute distress due to Shortness of breath Eyes: PERRL, EOMI ENT:MMM, No erythema noted in posterior pharynx Neck: JVD upto 10cm with positive hepatojugular reflex, No LAP Chest: tachypnic, b/l rales upto mid lungs with decreased bs at bases, respiratory effort is good. EAV:[id number], regular rhythmic rate, loud mechanical sound s/o mechanical valve which clouds other interpretation Abdo: soft,ND, BS+x4, Non tender and No hepatosplenomegaly EXT: 2+pittingedema on right upto knees, 1+pitting edema upto mid shin on left Skin: no  rashes noted.    EKG  Procedure date:  05/29/2010  Findings:      Sinus tachycardia with rate of 104.   Low voltage  Impression & Recommendations:  Problem # 1:  ACUTE ON CHRONIC SYSTOLIC HEART FAILURE (ICD-428.23) Assessment New Will start her on IV lasix, check BNP, 2Decho, continue home meds, strict Is/Os, daily weights, na restricted diet and follow clinically. Will give her 80mg  lasix 3 doses and then twice home dose that is 40mg  IV two times a day or acc to clinical improvement. Will inform Dr [name]. Admit to Stepdown as her SBP is 88. Will check routine labs.  Her updated medication list for this problem includes:    Furosemide 40 Mg Tabs (Furosemide) ..... One tablet in the morning    Warfarin Sodium 5 Mg Tabs (Warfarin sodium) .Marland Kitchen... 1 daily or as directed    Amiodarone Hcl 200 Mg Tabs (Amiodarone hcl) .Marland Kitchen... Take one tablet by mouth twice a day    Aspirin 81 Mg Tbec (Aspirin) .Marland Kitchen... Take one tablet by mouth daily  Problem # 2:  PRIMARY PULMONARY HYPERTENSION (ICD-416.0) On sildenafil.  Spoke with Dr. [name], and will continue at current dose  Problem # 3:  SLE (ICD-710.0) remains on Prednisone. Her updated medication list for this problem includes:    Prednisone 10 Mg Tabs (Prednisone) .Marland Kitchen... Take 1 tablet by mouth once a day    Plaquenil 200 Mg Tabs (Hydroxychloroquine sulfate) .Marland Kitchen... 2 tablets daily    Aspirin 81 Mg Tbec (Aspirin) .Marland Kitchen... Take one tablet by mouth daily  Problem # 4:  MITRAL STENOSIS (ICD-394.0) status post mitral valve replacement with post op volume overload.  Admit for diuresis.   Other Orders: T-2 View CXR (71020TC) EKG w/ Interpretation (93000)  Patient Instructions: 1)  Admit to [facility].

## 2010-11-12 NOTE — Progress Notes (Signed)
Summary: refill  Phone Note Refill Request Message from:  Pharmacy on September 09, 2010 2:05 PM  Refills Requested: Medication #1:  REVATIO 20 MG TABS one three times a day [place] [phone] [facility]'t [facility]  Initial call taken by: Judie Grieve,  September 09, 2010 2:06 PM  Follow-up for Phone Call       Follow-up by: Judithe Modest CMA,  September 09, 2010 2:42 PM    Prescriptions: REVATIO 20 MG TABS (SILDENAFIL CITRATE) one three times a day  #270 x 2   Entered by:   Judithe Modest CMA   Authorized by:   Marca Ancona, MD   Signed by:   Judithe Modest CMA on 09/09/2010   Method used:   Faxed to ...       [facility] [address] Environmental education officer)       [address]       [place], [place]  [address]       Ph: [phone]       Fax: [phone]   RxID:   [id number] REVATIO 20 MG TABS (SILDENAFIL CITRATE) one three times a day  #270 x 2   Entered by:   Judithe Modest CMA   Authorized by:   Marca Ancona, MD   Signed by:   Judithe Modest CMA on 09/09/2010   Method used:   [facility]        [facility]  [address] [phone]* (retail)       [address]       [place], [place]  [id number]       Ph: [phone] or [phone]       Fax: [phone]   RxID:   [id number]    Rx corrected and sent to [facility] for a 90 day supply with 2 refills.  [facility] and [facility] cancelled.  Judithe Modest, CMA

## 2010-11-12 NOTE — Letter (Signed)
Summary: [facility] Office Visit   [facility] Office Visit   Imported By: Roderic Ovens 08/04/2010 14:26:28  _____________________________________________________________________  External Attachment:    Type:   Image     Comment:   External Document

## 2010-11-12 NOTE — Progress Notes (Signed)
Summary: waiting  on renewal application  Phone Note From Other Clinic   Caller: [facility] office [phone] fax [phone] Request: Talk with Nurse Details of Complaint: status of renewal application - faxed on first week of dec.  Initial call taken by: Lorne Skeens,  October 08, 2010 10:29 AM  Follow-up for Phone Call        Phone call completed AWAITING FAXED  MD'S PORTION OF FORM TO BE FILLED OUT. Follow-up by: Scherrie Bateman, LPN,  October 08, 2010 10:38 AM     Appended Document: waiting  on renewal application APPLICATION  RE FAXED TO DR[facility]./CY

## 2010-11-12 NOTE — Letter (Signed)
Summary: Generic Letter  Architectural technologist, Main Office  [address]   [place], [place] [address]   Phone: [phone]  Fax: [phone]        August 17, 2010 MRN: [id number]    Katie [name] [address] [place], [place]  [address] DOB 28-May-1966     To Cardiac Rehabilitation:  Ms Borgeson may resume Cardiac Rehabilitation.         Sincerely,    Dalton McLean,MD  This letter has been electronically signed by your physician.  Appended Document: Generic Letter faxed to Cardiac Rehab [phone]

## 2010-11-12 NOTE — Medication Information (Signed)
Summary: rov/sp  Anticoagulant Therapy  Managed by: [name], PharmD Referring MD: [name] MD, Dalton PCP: Dr. Azzie Roup Supervising MD: [name] MD, [name] Indication 1: Pulmonary Hypertension Indication 2: Mitral Valve Replacement Lab Used: [facility] [place] Site: [address] INR POC 2.2 INR RANGE 2.5-3.5  Dietary changes: yes       Details: not eating as much  Health status changes: no    Bleeding/hemorrhagic complications: no    Recent/future hospitalizations: yes       Details: cath today if INR 2-3  Any changes in medication regimen? yes       Details: still taking amiodarone  Recent/future dental: no  Any missed doses?: yes     Details: Held fri, sat, sun for elevated INR   Is patient compliant with meds? yes       Current Medications (verified): 1)  Prednisone 1 Mg Tabs (Prednisone) .... 3 Tabs Once Daily 2)  Multivitamins   Tabs (Multiple Vitamin) .Marland Kitchen.. 1 Tab By Mouth Once Daily 3)  Tylenol 325 Mg Tabs (Acetaminophen) .... As Directed As Needed 4)  Furosemide 40 Mg Tabs (Furosemide) .... One Tablet in The Morning 5)  Plaquenil 200 Mg Tabs (Hydroxychloroquine Sulfate) .... 2 Tablets Daily 6)  Warfarin Sodium 5 Mg Tabs (Warfarin Sodium) .Marland Kitchen.. 1 Daily or As Directed 7)  Amiodarone Hcl 200 Mg Tabs (Amiodarone Hcl) .... Take One Tablet Daily 8)  Aspirin 81 Mg Tbec (Aspirin) .... Take One Tablet By Mouth Daily 9)  Tramadol Hcl 50 Mg Tabs (Tramadol Hcl) .... As Needed 10)  Prednisone 5 Mg Tabs (Prednisone) .... Once Daily  Allergies (verified): 1)  ! Methotrexate  Anticoagulation Management History:      The patient is taking warfarin and comes in today for a routine follow up visit.  Negative risk factors for bleeding include an age less than 50 years old.  The bleeding index is 'low risk'.  Positive CHADS2 values include History of CHF.  Negative CHADS2 values include Age > 21 years old.  Her last INR was 5.0 ratio.  Anticoagulation responsible  provider: [name] MD, [place].  INR POC: 2.2.  Cuvette Lot#: [id number].  Exp: 07/2011.    Anticoagulation Management Assessment/Plan:      The patient's current anticoagulation dose is Warfarin sodium 5 mg tabs: 1 daily or as directed.  The target INR is 2.5-3.5.  The next INR is due 07/28/2010.  Anticoagulation instructions were given to patient.  Results were reviewed/authorized by [name], PharmD.  She was notified by [name] PharmD.         Prior Anticoagulation Instructions: INR 5.0  Spoke with pt.  Skip Coumadin x 3 days, take 1/2 tablet on Monday and will check INR on Tuesday.    Current Anticoagulation Instructions: INR 2.2  Restart Coumadin on Wednesday, October 5th. Resume taking Coumadin 0.5 tab (2.5 mg) on all days. Return to clinic in 2 weeks.

## 2010-11-12 NOTE — Progress Notes (Signed)
Summary: cont / d/c meds revatio  Phone Note From Pharmacy   Caller: Richardean Canal from [facility] [phone] ext [phone] Request: Speak with Nurse Summary of Call: Unable to reach pt regarding REVATIO 20 MG TABS one table three times a day. has pt continue or d.c meds.  Initial call taken by: Lorne Skeens,  June 12, 2010 9:58 AM  Follow-up for Phone Call        talked with Morrie Sheldon and confirmed Revatio dose of 20mg  three times a day

## 2010-11-12 NOTE — Letter (Signed)
Summary: [facility] Office Note  [facility] Office Note   Imported By: Roderic Ovens 01/15/2010 14:38:55  _____________________________________________________________________  External Attachment:    Type:   Image     Comment:   External Document

## 2010-11-12 NOTE — Letter (Signed)
Summary: [facility] Medical Office Progress Report   [facility] Progress Report   Imported By: Roderic Ovens 04/04/2010 09:54:54  _____________________________________________________________________  External Attachment:    Type:   Image     Comment:   External Document

## 2010-11-12 NOTE — Letter (Signed)
Summary: [facility]  [facility]   Imported By: Marylou Mccoy 08/19/2010 14:47:20  _____________________________________________________________________  External Attachment:    Type:   Image     Comment:   External Document

## 2010-11-12 NOTE — Letter (Signed)
Summary: [facility]   Imported By: Sherian Rein 03/25/2010 08:34:08  _____________________________________________________________________  External Attachment:    Type:   Image     Comment:   External Document

## 2010-11-12 NOTE — Medication Information (Signed)
Summary: rov/tm  Anticoagulant Therapy  Managed by: [name], PharmD Referring MD: [name] MD, [name] PCP: Dr. Azzie Roup Supervising MD: [name] MD, [name] Indication 1: Pulmonary Hypertension Indication 2: Mitral Valve Replacement Lab Used: [facility] [place] Site: [address] INR POC 2.8 INR RANGE 2.5-3.5  Dietary changes: no    Health status changes: no    Bleeding/hemorrhagic complications: no    Recent/future hospitalizations: no    Any changes in medication regimen? yes       Details: Started cipro BID 08/04/10 for 10 day course  Recent/future dental: no  Any missed doses?: no       Is patient compliant with meds? yes       Allergies: 1)  ! Methotrexate  Anticoagulation Management History:      The patient is taking warfarin and comes in today for a routine follow up visit.  Negative risk factors for bleeding include an age less than 28 years old.  The bleeding index is 'low risk'.  Positive CHADS2 values include History of CHF.  Negative CHADS2 values include Age > 77 years old.  Her last INR was 5.0 ratio.  Anticoagulation responsible provider: [name] MD, [place].  INR POC: 2.8.  Cuvette Lot#: [id number].  Exp: 08/2011.    Anticoagulation Management Assessment/Plan:      The patient's current anticoagulation dose is Warfarin sodium 5 mg tabs: 1 daily or as directed.  The target INR is 2.5-3.5.  The next INR is due 08/31/2010.  Anticoagulation instructions were given to patient.  Results were reviewed/authorized by [name], PharmD.  She was notified by Haynes Hoehn, PharmD Candidate.         Prior Anticoagulation Instructions: INR 3.1  Continue on same dosage 2.5mg  daily.  Recheck in 3 weeks.    Current Anticoagulation Instructions: INR 2.8  Continue Coumadin as scheduled:  1/2 tablet every day of the week.  Return to clinic in 3 weeks.

## 2010-11-12 NOTE — Progress Notes (Signed)
Summary: heart surgery date-FYI  Phone Note Call from Patient   Caller: Patient Call For: Darleny Sem Summary of Call: pt just calling as FYI to let Dr. Delton Coombes know that her heart surgery is set for 03-17-10. She states she is not having any pulmonary issues at this time. She just wanted to let you know the date. Do you need to see the pt before surgery? Please advise. Carron Curie CMA  Feb 25, 2010 10:52 AM  Initial call taken by: Carron Curie CMA,  Feb 25, 2010 10:52 AM  Follow-up for Phone Call        Probably do not need to see her prior to the procedure, but I will coordinate with Dr [name] and thoracic surgery to plan how to manage her meds in the perioperative period. I believe that the safest approach will be to continue her revatio both pre- and post-op. We could consider decreasing or stopping after the valve is fixed, follow clinically and with r heart cath. Will discuss with Dr [name]  Follow-up by: Leslye Peer MD,  Mar 01, 2010 3:44 PM  Additional Follow-up for Phone Call Additional follow up Details #1::        LMOMTCB x 1. Zackery Barefoot CMA  Mar 02, 2010 11:09 AM   pt advised.Carron Curie CMA  Mar 02, 2010 11:24 AM

## 2010-11-12 NOTE — Progress Notes (Signed)
Summary: speak to nurse  Phone Note Call from Patient Call back at [phone]   Caller: Patient Reason for Call: Talk to Nurse Summary of Call: request to speak to nurse Initial call taken by: Migdalia Dk,  March 24, 2010 9:47 AM  Follow-up for Phone Call        [facility] Katina Dung, RN, BSN  March 24, 2010 10:50 AM   returning call, Migdalia Dk  March 24, 2010 10:53 AM -I talked with pt by telephone-she had questions about Revatio that she has already resolved with [facility]--

## 2010-11-12 NOTE — Assessment & Plan Note (Signed)
Summary: new pt mastitis pseudomonas   Visit Type:  Consult Referring Colin Ellers:  Dr. Sherene Sires Primary Ginger Leeth:  Dr. Azzie Roup  CC:  new patient pseudmonas referred from Dr. Dareen Piano.  History of Present Illness: 45 year old AA lady with SLE, pulmonary fibrosis,  followed by Dr. Dareen Piano who underwent minithoracotomy for mitral valve replacement.  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 After this she developed an absess within the right breast with evidence of osteomyelitis of underlying sternum and pulmonary inflitrates. She underwent Ct guided aspirate of the breast abscess and pseudomonas aeruginosa was isolated from culture. She was placed on cipro 250 two times a day and had dramatic improvement in the swelling and pain(that extended across  her chest). She has been without fevers, chills,  or systemic symptoms since then and fees much much better.  A repeat CT scan showed resolutoin of abscess but concerning area involving the manubrium and sternum. She has just seen Dr. 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 3 Tabs Once Daily 2)  Multivitamins   Tabs (Multiple Vitamin) .Marland Kitchen..  1 Tab By Mouth Once Daily 3)  Tylenol 325 Mg Tabs (Acetaminophen) .... As Directed As Needed 4)  Furosemide 40 Mg Tabs (Furosemide) .... One-Half  Tablet in The Morning 5)  Plaquenil 200 Mg Tabs (Hydroxychloroquine Sulfate) .... 2 Tablets Daily 6)  Warfarin Sodium 5 Mg Tabs (Warfarin Sodium) .Marland Kitchen.. 1 Daily or As Directed 7)  Aspirin 81 Mg Tbec (Aspirin) .... Take One Tablet By Mouth Daily 8)  Tramadol Hcl 50 Mg Tabs (Tramadol Hcl) .... As Needed 9)  Prednisone 5 Mg Tabs (Prednisone) .... Once Daily 10)  Revatio 20 Mg Tabs (Sildenafil Citrate) .... One Three Times A Day 11)  Toprol Xl 25 Mg Xr24h-Tab (Metoprolol Succinate) .... One-Half Tablet  Daily 12)  Vicodin 5-500 Mg Tabs (Hydrocodone-Acetaminophen) .... Take One Every 4-6 Hours As Needed For Pain. 13)  Cipro 250 Mg Tabs (Ciprofloxacin Hcl) .... Take 1 Tablet By Mouth Two Times A Day For 6 Weeks  Current Medications (verified): 1)  Prednisone 1 Mg Tabs (Prednisone) .... 3 Tabs Once Daily 2)  Multivitamins   Tabs (Multiple Vitamin) .Marland Kitchen.. 1 Tab By Mouth Once Daily 3)  Tylenol 325 Mg Tabs (Acetaminophen) .... As Directed As Needed 4)  Furosemide 40 Mg Tabs (Furosemide) .... One-Half  Tablet in The Morning 5)  Plaquenil 200 Mg Tabs (Hydroxychloroquine Sulfate) .... 2 Tablets Daily 6)  Warfarin Sodium 5 Mg Tabs (Warfarin Sodium) .Marland Kitchen.. 1 Daily or As Directed 7)  Aspirin 81 Mg Tbec (Aspirin) .... Take One Tablet By Mouth Daily 8)  Tramadol Hcl 50 Mg Tabs (Tramadol Hcl) .... As Needed 9)  Prednisone 5 Mg Tabs (Prednisone) .... Once Daily 10)  Revatio 20 Mg Tabs (Sildenafil Citrate) .... One Three Times A Day 11)  Toprol Xl 25 Mg Xr24h-Tab (Metoprolol Succinate) .... One-Half Tablet  Daily 12)  Vicodin 5-500 Mg Tabs (Hydrocodone-Acetaminophen) .... Take One Every 4-6 Hours As Needed For Pain. 13)  Ciprofloxacin Hcl 500 Mg Tabs (Ciprofloxacin Hcl) .... Take 1 Tablet By Mouth Two Times A Day Until Instructed By Dr Daiva Eves  Allergies: 1)  !  Methotrexate   Preventive Screening-Counseling & Management  Alcohol-Tobacco     Smoking Status: never   Current Allergies (reviewed today): ! METHOTREXATE Past History:  Past Medical History: 1. Mixed connective tissue disease: diagnosis 1990. 8/10 ANA positive, anti-dsDNA positive, anti-SCL70 negative 2. 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 CHF: Secondary to moderate mitral stenosis and moderate mitral regurgitation in the setting of rheumatic mitral valve disease as well as severe pulmonary HTN likely due to a combination of MV disease and pulmonary arterial HTN in the setting of collagen vascular disease.   Not valvuloplasty candidate due to MR.  Patient had MV replacement with mechanical MV in 8/11.  Echo (8/11) post-op showed normally functioning mechanical MV, EF 60-65% with septal bounce, small mobile structure in the mid ventricle (? loose papillary muscle), RV-RA gradient 22 mmHg.  Echo (10/11): EF 60-65%, normally functioning mechanical mitral valve.  6.  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  CTA chest (7/10) with no evidence for pulmonary emboli or chronic pulmonary emboli.  PA pressure 72/28 mean 50 with PVR 5.7 WU on initial RHC, PA pressure 45/21 mean 30 PVR 2.3 WU on f/u RHC on Revatio 80 mg three times a day (1/11).  After MV surgery, Revatio stopped.  Increased dyspnea.  Repeat RHC with mean RA 6, PA 42/20 (mean 29), PVR 4.8 WU, CI 2.3.  Revatio 20 mg three times a day restarted. - 3/11 6 miin walk 427 meters 7.  ? childhood rheumatic fever: episode of joint pains lasting for several weeks around 45 years old.  Never had diagnosis.  8.  LHC (9/10):  no angiographic CAD.  9.  Warfarin anticoagulation for mechanical MV, goal INR 2.5-3.5 10.  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  11. sterOsteomyeliitis abscess of right breast Pseudomonas.   Past Surgical History: Reviewed history from 05/29/2010 and no changes required. s/p mitral valve replacement on Aug 3rd 2011 by Dr [name] by minimally invasive window.  Additional History Menstrual Status:  regular  Review of Systems  The patient denies anorexia, fever, weight loss, weight gain, vision loss, decreased hearing, hoarseness, chest pain, syncope, dyspnea on exertion, peripheral edema, prolonged cough, headaches, hemoptysis, abdominal pain, melena, hematochezia, severe indigestion/heartburn, hematuria, incontinence, genital sores, muscle weakness, suspicious skin lesions, transient blindness, difficulty walking, depression, unusual weight change, and abnormal bleeding.    Vital Signs:  Patient profile:   45 year old female Menstrual status:  regular LMP:     09/24/2010 Height:      65 inches (165.10 cm) Weight:      171 pounds (77.73 kg) BMI:     28.56 Temp:  98.1 degrees F (36.72 degrees C) oral Pulse rate:   84 / minute BP sitting:   109 / 75  (left arm)  Vitals Entered By: Starleen Arms CMA (September 29, 2010 10:53 AM) CC: new patient pseudmonas referred from Dr. Dareen Piano Is Patient Diabetic? No Pain Assessment Patient in pain? no      Nutritional Status BMI of 25 - 29 = overweight Nutritional Status Detail nl  Does patient need assistance? Functional Status Self care Ambulation Normal LMP (date): 09/24/2010     Menstrual Status regular Enter LMP: 09/24/2010   Physical Exam  General:  Well developed, well nourished, in no acute distress. Head:  normocephalic and atraumatic Eyes:  vision grossly intact, pupils equal, and pupils round.   Nose:  no deformity, discharge, inflammation, or lesions Mouth:   Contractures in the skin around mouth.  Chest Wall:  pt with no tenderness about the sternum, she has surgical scars from minithoracoatmy well healed Lungs:  normal respiratory effort.  expiratory rales Heart:  normal rate and regular rhythm.   Abdomen:  soft and no distention.   Msk:  normal ROM.   Extremities:  trace left pedal edema and trace right pedal edema.   Neurologic:  alert & oriented X3, strength normal in all extremities, and gait normal.   Skin:  minimal erythema remaining in right breast skin that is not warm or tender Psych:  Oriented X3, memory intact for recent and remote, normally interactive, and good eye contact.     Impression & Recommendations:  Problem # 1:  ACUTE OSTEOMYELITIS OTHER SPECIFIED SITE (ICD-730.08) I will continue her cipro (but at higher 500mg  two times a day dose) until we have had repeat CT scan that is reassuring concerning the sternum and manubrium. The report is concerning and I wonder if she may ultimately need surgery in that area. Willl check esr as well keeping in mind her SLE could have effect ont his as well The following medications were removed from the medication list:    Cipro 250 Mg Tabs (Ciprofloxacin hcl) .Marland Kitchen... Take 1 tablet by mouth two times a day for 6 weeks Her updated medication list for this problem includes:    Tylenol 325 Mg Tabs (Acetaminophen) .Marland Kitchen... As directed as needed    Aspirin 81 Mg Tbec (Aspirin) .Marland Kitchen... Take one tablet by mouth daily    Tramadol Hcl 50 Mg Tabs (Tramadol hcl) .Marland Kitchen... As needed    Vicodin 5-500 Mg Tabs (Hydrocodone-acetaminophen) .Marland Kitchen... Take one every 4-6 hours as needed for pain.    Ciprofloxacin Hcl 500 Mg Tabs (Ciprofloxacin hcl) .Marland Kitchen... Take 1 tablet by mouth two times a day until instructed by dr Zenaida Niece dam  Orders: T-Basic Metabolic Panel [id number]) T-C-Reactive Protein [id number]) T-CBC w/Diff [id number]) T-Sed Rate (Automated) [id number]) Consultation Level IV (28413)  Problem # 2:  MASTITIS  (ICD-611.0)  this  component of her infeciton has resolved.  Orders: Consultation Level IV (24401)  Problem # 3:  PSEUDOMONAS INFECTION (ICD-041.7)  continue cipro as above  Orders: Consultation Level IV (02725)  Medications Added to Medication List This Visit: 1)  Ciprofloxacin Hcl 500 Mg Tabs (Ciprofloxacin hcl) .... Take 1 tablet by mouth two times a day until instructed by dr Zenaida Niece dam  Patient Instructions: 1)  rtc to see Dr Daiva Eves in late January 2)  continue the ciprofloxacin but at a dose of 500mg  twice daily Prescriptions: CIPROFLOXACIN HCL 500 MG TABS (CIPROFLOXACIN HCL) Take 1 tablet by mouth two times a day until instructed  by Dr Daiva Eves  #60 x 2   Entered and Authorized by:   Acey Lav MD   Signed by:   Paulette Blanch Dam MD on 09/29/2010   Method used:   Electronically to        [facility] [phone]* (retail)       [address]       [place], [place]  [id number]       Ph: [phone] or [phone]       Fax: [phone]   RxID:   [id number]

## 2010-11-12 NOTE — Progress Notes (Signed)
Summary: restarting Revatio  Phone Note Other Incoming   Caller: Dr [name] Summary of Call: restart Revatio  Follow-up for Phone Call        per Dr McLean--pt to restart Revatio 20mg   three times a day --pt aware    New/Updated Medications: REVATIO 20 MG TABS (SILDENAFIL CITRATE) one three times a day   Current Medications (verified): 1)  Prednisone 1 Mg Tabs (Prednisone) .... 3 Tabs Once Daily 2)  Multivitamins   Tabs (Multiple Vitamin) .Marland Kitchen.. 1 Tab By Mouth Once Daily 3)  Tylenol 325 Mg Tabs (Acetaminophen) .... As Directed As Needed 4)  Furosemide 40 Mg Tabs (Furosemide) .... One Tablet in The Morning 5)  Plaquenil 200 Mg Tabs (Hydroxychloroquine Sulfate) .... 2 Tablets Daily 6)  Warfarin Sodium 5 Mg Tabs (Warfarin Sodium) .Marland Kitchen.. 1 Daily or As Directed 7)  Amiodarone Hcl 200 Mg Tabs (Amiodarone Hcl) .... Take One Tablet Daily 8)  Aspirin 81 Mg Tbec (Aspirin) .... Take One Tablet By Mouth Daily 9)  Tramadol Hcl 50 Mg Tabs (Tramadol Hcl) .... As Needed 10)  Prednisone 5 Mg Tabs (Prednisone) .... Once Daily 11)  Revatio 20 Mg Tabs (Sildenafil Citrate) .... One Three Times A Day  Allergies: 1)  ! Methotrexate  pt needs reauthorization for Revatio--form to be faxed to Dr [name] for completion--pt states she has medication to take for now I have not received prior authorization form for Revatio  for Dr [name] to complete  -I called [phone]-- I spoke with Darlene--I was transferred to [phone] case number is [id number]--D talked with Tonya--Revatio 20mg  three times a day has been aprroved from 06/25/10-07/16/11--if pt has questions customer service is [phone]--pt is aware this has been approved until 07/16/11

## 2010-11-12 NOTE — Assessment & Plan Note (Signed)
Summary: f2w   Visit Type:  Follow-up Referring Provider:  Dr. Sherene Sires Primary Provider:  Dr. Azzie Roup  CC:  chest pain.  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  She was readmitted with severe shortness of breath post-operatively and was found to have a large pericardial effusion with early tamponade.  She had a pericardial window.  Given some ongoing dyspnea, I did a right heart cath showing mild residual pulmonary hypertension with moderately elevated PVR.  Given ongoing elevation in PVR and PA pressure, I restarted her on low dose Revatio at 20 mg three times a day.   At last appointment, she had pain and swelling in her upper chest.  CT chest showed a superficial fluid collection that cultured positive for Pseudomonas.   She was started on Cipro.  Chest pain has improved on antibiotics but is still present.  Upper chest hurts with taking a deep breath.  It hurts her when she carries her baby.  She is able to walk without dyspnea and has no problem with a flight of steps.    Labs (8/11): HCT 27 => 30, creatinine 0.98, INR 4 Labs (9/11): K 4.6, creatinine 0.9, HCT 32.7, BNP 214=>146, LFTs normal, TSH normal   Current Medications (verified): 1)  Prednisone 1 Mg Tabs (Prednisone) .... 3 Tabs Once Daily 2)  Multivitamins   Tabs (Multiple Vitamin) .Marland Kitchen.. 1 Tab By Mouth Once Daily 3)  Tylenol 325 Mg Tabs (Acetaminophen) .... As Directed As Needed 4)  Furosemide 40 Mg Tabs (Furosemide) .... One-Half  Tablet in The Morning 5)  Plaquenil 200 Mg Tabs (Hydroxychloroquine Sulfate) .... 2 Tablets Daily 6)  Warfarin Sodium 5 Mg Tabs (Warfarin Sodium) .Marland Kitchen.. 1 Daily or As Directed 7)  Aspirin 81 Mg Tbec (Aspirin) .... Take One Tablet By Mouth  Daily 8)  Tramadol Hcl 50 Mg Tabs (Tramadol Hcl) .... As Needed 9)  Prednisone 5 Mg Tabs (Prednisone) .... Once Daily 10)  Revatio 20 Mg Tabs (Sildenafil Citrate) .... One Three Times A Day 11)  Toprol Xl 25 Mg Xr24h-Tab (Metoprolol Succinate) .... One-Half Tablet  Daily 12)  Vicodin 5-500 Mg Tabs (Hydrocodone-Acetaminophen) .... Take One Every 4-6 Hours As Needed For Pain. 13)  Cipro 250 Mg Tabs (Ciprofloxacin Hcl) .... Take 1 Tablet By Mouth Two Times A Day For 6 Weeks  Allergies: 1)  ! Methotrexate  Past History:  Past Medical History: 1. Mixed connective tissue disease: diagnosis 1990. 8/10 ANA positive, anti-dsDNA positive, anti-SCL70 negative 2. 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 CHF: Secondary to moderate mitral stenosis and moderate mitral regurgitation in the setting of rheumatic mitral valve disease as well as severe pulmonary HTN likely due to a combination of MV disease and pulmonary arterial HTN in the setting of collagen vascular disease.   Not valvuloplasty candidate due to MR.  Patient had MV replacement with mechanical MV in 8/11.  Echo (8/11) post-op showed normally functioning mechanical MV, EF 60-65% with septal bounce,  small mobile structure in the mid ventricle (? loose papillary muscle), RV-RA gradient 22 mmHg.  Echo (10/11): EF 60-65%, normally functioning mechanical mitral valve.  6.  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  CTA chest (7/10) with no evidence for pulmonary emboli or chronic pulmonary emboli.  PA pressure 72/28 mean 50 with PVR 5.7 WU on initial RHC, PA pressure 45/21 mean 30 PVR 2.3 WU on f/u RHC on Revatio 80 mg three times a  day (1/11).  After MV surgery, Revatio stopped.  Increased dyspnea.  Repeat RHC with mean RA 6, PA 42/20 (mean 29), PVR 4.8 WU, CI 2.3.  Revatio 20 mg three times a day restarted. - 3/11 6 miin walk 427 meters 7.  ? childhood rheumatic fever: episode of joint pains lasting for several weeks around 45 years old.  Never had diagnosis.  8.  LHC (9/10): no angiographic CAD.  9.  Warfarin anticoagulation for mechanical MV, goal INR 2.5-3.5 10.  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  11.  Chest wall soft tissue infection, cultured Pseudomonas.    Family History: Reviewed history from 06/20/2009 and no changes required. Emphysema mother, smoker.  Grandmother with "heart disease"  Social History: Reviewed history from 11/25/2009 and no changes required. 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  Lives with father of child.   Vital Signs:  Patient profile:   45 year old female Height:      65 inches Weight:      168 pounds BMI:     28.06 Pulse rate:   84 / minute Resp:     18 per minute BP sitting:   96 / 64  (left arm) Cuff size:   large  Vitals Entered By: Vikki Ports (August 17, 2010 9:38 AM)  Physical Exam  General:  Well developed, well nourished, in no acute distress. Neck:  Neck supple, no JVD. No masses, thyromegaly or abnormal cervical nodes. Lungs:  Clear bilaterally to auscultation and percussion. Heart:  Non-displaced PMI,  regular rate and rhythm, S1, S2. mechanical S1.  Carotid upstroke normal, no bruit. Pedals normal pulses. No edema.  Abdomen:  Bowel sounds positive; abdomen soft and non-tender without masses, organomegaly, or hernias noted. No hepatosplenomegaly. Msk:  Minimal tenderness in upper chest wall.  Extremities:  No clubbing or cyanosis. Neurologic:  Alert and oriented x 3. Psych:  Normal affect.   Impression & Recommendations:  Problem # 1:  CHEST PAIN-UNSPECIFIED (ICD-786.50) This  appears to have been due to a soft tissue infection in the chest wall by Pseudomonas.  She is on long-term Cipro, to which the bacteria is sensitive.  The pain and swelling are decreasing.  CBC, ESR today.   Problem # 2:  MITRAL STENOSIS (ICD-394.0) Status post mitral valve replacement, stable from valve standpoint.  She is on coumadin goal INR 2.5 - 3.5 and ASA 81 mg daily.   Problem # 3:  PULMONARY HYPERTENSION, SECONDARY (ICD-416.8) Pulmonary HTN is likely a mixed picture, from mitral valve disease as well as mixed connective tissue disease.  She had mild residual pulmonary HTN with moderately increased PVR even after mitral valve surgery on recent RHC (though this is improved).  I restarted her on Revatio 20 mg three times a day and her breathing has improved.  Continue Revatio.   Other Orders: TLB-CBC Platelet - w/Differential (85025-CBCD) TLB-Sedimentation Rate (ESR) (85652-ESR)  Patient Instructions:  1)  Your physician recommends that you have lab today--CBC/Sed rate  786.50 424.0 2)  Your physician recommends that you schedule a follow-up appointment in: 3 months with Dr [name].

## 2010-11-12 NOTE — Medication Information (Signed)
Summary: Katie [name]  Anticoagulant Therapy  Managed by: Cloyde Reams, RN, BSN Referring MD: [name] MD, [place] PCP: Dr. Azzie Roup Supervising MD: Myrtis Ser MD, Tinnie Gens Indication 1: Pulmonary Hypertension Lab Used: [facility] [place] Site: [address] INR POC 1.4 INR RANGE 1.5-2.5  Dietary changes: no    Health status changes: no    Bleeding/hemorrhagic complications: no    Recent/future hospitalizations: no    Any changes in medication regimen? no    Recent/future dental: no  Any missed doses?: no       Is patient compliant with meds? yes       Allergies: 1)  ! Methotrexate  Anticoagulation Management History:      The patient is taking warfarin and comes in today for a routine follow up visit.  Negative risk factors for bleeding include an age less than 62 years old.  The bleeding index is 'low risk'.  Positive CHADS2 values include History of CHF.  Negative CHADS2 values include Age > 36 years old.  Her last INR was 1.3 ratio.  Anticoagulation responsible provider: Myrtis Ser MD, Tinnie Gens.  INR POC: 1.4.  Cuvette Lot#: [id number].  Exp: 01/2011.    Anticoagulation Management Assessment/Plan:      The patient's current anticoagulation dose is Warfarin sodium 5 mg tabs: 1 daily or as directed.  The target INR is 1.5-2.5.  The next INR is due 11/28/2009.  Anticoagulation instructions were given to patient.  Results were reviewed/authorized by Cloyde Reams, RN, BSN.  She was notified by Cloyde Reams, RN, BSN.         Prior Anticoagulation Instructions: INR 1.2  Take an extra 1/2 tablet today (2 tablets) and tomorrow (1.5 tablets) and then resume same dosage 1 tablet daily except 1.5 tablets on Mondays, Wednesdays, and Fridays.  Recheck in 1 week.    Current Anticoagulation Instructions: INR 1.4  Take 1.5 tablets today, then start taking 1.5 tablets daily except 1 tablet on Tuesdays, Thursdays, and Saturdays.  Recheck in 10 days.

## 2010-11-12 NOTE — Letter (Signed)
Summary: [facility]  [facility]   Imported By: Sherian Rein 01/08/2010 14:11:03  _____________________________________________________________________  External Attachment:    Type:   Image     Comment:   External Document

## 2010-11-12 NOTE — Letter (Signed)
Summary: Handout Printed  Printed Handout:  - Coumadin Instructions-w/out Meds 

## 2010-11-12 NOTE — Medication Information (Signed)
Summary: rov/ewj  Anticoagulant Therapy  Managed by: Shelby Dubin, PharmD, BCPS, CPP Referring MD: [name] MD, [name] PCP: Dr. Azzie Roup Supervising MD: Juanda Chance MD, Binh Doten Indication 1: Pulmonary Hypertension Lab Used: [facility] [place] Site: [address] INR POC 1.4 INR RANGE 1.5-2.5  Dietary changes: no    Health status changes: no    Bleeding/hemorrhagic complications: no    Recent/future hospitalizations: no    Any changes in medication regimen? no    Recent/future dental: no  Any missed doses?: no       Is patient compliant with meds? yes       Allergies (verified): 1)  ! Methotrexate  Anticoagulation Management History:      The patient is taking warfarin and comes in today for a routine follow up visit.  Negative risk factors for bleeding include an age less than 65 years old.  The bleeding index is 'low risk'.  Positive CHADS2 values include History of CHF.  Negative CHADS2 values include Age > 64 years old.  Her last INR was 1.3 ratio.  Anticoagulation responsible provider: Juanda Chance MD, Smitty Cords.  INR POC: 1.4.  Cuvette Lot#: [id number].  Exp: 02/2011.    Anticoagulation Management Assessment/Plan:      The patient's current anticoagulation dose is Warfarin sodium 5 mg tabs: 1 daily or as directed.  The target INR is 1.5-2.5.  The next INR is due 01/02/2010.  Anticoagulation instructions were given to patient.  Results were reviewed/authorized by Shelby Dubin, PharmD, BCPS, CPP.  She was notified by Shelby Dubin PharmD, BCPS, CPP.         Prior Anticoagulation Instructions: INR 2.1  Continue on same dosage 1.5 tablets daily except 1 tablet on Tuesdays, Thursdays, and Saturdays.  Recheck in 2 weeks.    Current Anticoagulation Instructions: INR 1.4  Take 2 tabs today only, then take 1.5 tabs daily except 1 tab each Tuesday and Thursday.  Recheck in 3 weeks.

## 2010-11-12 NOTE — Letter (Signed)
Summary: Generic Letter  Architectural technologist, Main Office  [address]   [place], [place] [address]   Phone: [phone]  Fax: [phone]        July 23, 2010 MRN: [id number]    Katie [name] [address] [place], [place]  [address] DOB Oct 30, 1965    [name] can resume Cardiac Rehab.         Sincerely,      Katie McLean,MD  This letter has been electronically signed by your physician.

## 2010-11-12 NOTE — Letter (Signed)
Summary: [facility] - Medication Issue Communication/Order  [facility] - Medication Issue Communication/Order   Imported By: Marylou Mccoy 07/09/2010 10:44:44  _____________________________________________________________________  External Attachment:    Type:   Image     Comment:   External Document

## 2010-11-12 NOTE — Medication Information (Signed)
Summary: [facility]  Anticoagulant Therapy  Managed by: [name], PharmD Referring MD: [name] MD, [name] PCP: Dr. Azzie Roup Supervising MD: Clifton James MD, Cristal Deer Indication 1: Pulmonary Hypertension Indication 2: Mitral Valve Replacement Lab Used: [facility] [place] Site: [address] INR POC 5.0 INR RANGE 2.5-3.5  Dietary changes: yes       Details: ate a few more salads  Health status changes: no    Bleeding/hemorrhagic complications: no    Recent/future hospitalizations: yes       Details: pending right heart cath on Tuesday. Need INR between 2-2.5  Any changes in medication regimen? no    Recent/future dental: no  Any missed doses?: no       Is patient compliant with meds? yes       Allergies: 1)  ! Methotrexate  Anticoagulation Management History:      Her anticoagulation is being managed by telephone today.  Negative risk factors for bleeding include an age less than 19 years old.  The bleeding index is 'low risk'.  Positive CHADS2 values include History of CHF.  Negative CHADS2 values include Age > 55 years old.  Her last INR was 5.0 ratio.  Anticoagulation responsible provider: Clifton James MD, Cristal Deer.  INR POC: 5.0.  Exp: 07/2011.    Anticoagulation Management Assessment/Plan:      The patient's current anticoagulation dose is Warfarin sodium 5 mg tabs: 1 daily or as directed.  The target INR is 2.5-3.5.  The next INR is due 07/14/2010.  Anticoagulation instructions were given to patient.  Results were reviewed/authorized by [name], PharmD.  She was notified by [name] PharmD.         Prior Anticoagulation Instructions: INR 3.3  Continue same dose fo 1/2 tablet every day except 1 tablet on Wednesday.  Recheck INR in 3 weeks.   Current Anticoagulation Instructions: INR 5.0  Spoke with pt.  Skip Coumadin x 3 days, take 1/2 tablet on Monday and will check INR on Tuesday.

## 2010-11-12 NOTE — Medication Information (Signed)
Summary: rov/mw  Anticoagulant Therapy  Managed by: [name], PharmD Referring MD: [name] MD, [name] PCP: Dr. Azzie Roup Supervising MD: Tenny Craw MD, Gunnar Fusi Indication 1: Pulmonary Hypertension Indication 2: Mitral Valve Replacement Lab Used: [facility] [place] Site: [address] INR POC 1.6 INR RANGE 2.5-3.5  Dietary changes: no    Health status changes: no    Bleeding/hemorrhagic complications: no    Recent/future hospitalizations: no    Any changes in medication regimen? no    Recent/future dental: no  Any missed doses?: no       Is patient compliant with meds? yes       Allergies: 1)  ! Methotrexate  Anticoagulation Management History:      The patient is taking warfarin and comes in today for a routine follow up visit.  Negative risk factors for bleeding include an age less than 102 years old.  The bleeding index is 'low risk'.  Positive CHADS2 values include History of CHF.  Negative CHADS2 values include Age > 61 years old.  Her last INR was 5.0 ratio.  Anticoagulation responsible provider: Tenny Craw MD, Gunnar Fusi.  INR POC: 1.6.  Cuvette Lot#: [id number].  Exp: 11/2011.    Anticoagulation Management Assessment/Plan:      The patient's current anticoagulation dose is Warfarin sodium 5 mg tabs: 1 daily or as directed.  The target INR is 2.5-3.5.  The next INR is due 10/02/2010.  Anticoagulation instructions were given to patient.  Results were reviewed/authorized by [name], PharmD.  She was notified by [name] PharmD.         Prior Anticoagulation Instructions: INR 1.3  Take a whole tablet today, tomorrow and saturday. Then resume normal dosing schedule of a half tablet everyday.  Recheck in 1 week.   Current Anticoagulation Instructions: INR 1.6  Increase dose to 1 tablet every day except 1/2 tablet on Sunday, Tuesday and Thursday.  Recheck INR in 10-14 days

## 2010-11-12 NOTE — Letter (Signed)
Summary: [facility] Patient Note   [facility] Patient Note   Imported By: Roderic Ovens 07/10/2010 11:30:53  _____________________________________________________________________  External Attachment:    Type:   Image     Comment:   External Document

## 2010-11-12 NOTE — Assessment & Plan Note (Signed)
Summary: PER CHECK OUT/SF   Referring Provider:  Dr. Sherene Sires Primary Provider:  Dr. Azzie Roup  CC:  rov/.  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  She has been followed by pulmonology for interstitial lung disease.  Beginning in 1/10, after becoming pregnant, she became increasingly short of breath.  In around 2/10, she was started on lasix.  By 6/10, she was short of breath just walking around in her house.  She delivered in 6/10 by C-section.  Echo at the time of her hospitalization for C-section showed severe pulmonary hypertension as well as at least moderate mitral regurgitation.  She was found at this hospitalization to be hypoxic and was started on home O2.    She has had TTE, TEE, and RHC/LHC (see description in PMH).  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    I started her on Revatio and increased it up to 80 mg three times a day.  She has tolerated this very well and has improved symptomatically.  She is not short of breath walking on flat ground and can climb a flight of steps, even carrying her son.  She walked 1/2 mile recently with her son attached to her in a baby carrier and had minimal dyspnea.  She is no longer using oxygen.  She is back at work as a Surveyor, mining.  Repeat RHC showed that mean PA pressure had decreased from 50 mmHg to 30 mmHg and PVR had decreased from 5.7 to 2.3 WU.  Repeat echo showed moderate to severe mitral stenosis and moderate mitral regurgitation.     Patient was supposed to have her mitral valve surgery in 6/11 but developed an infection  in the soft tissue of the proximal phalanx of her left middle finger requiring debridement by a hand surgeon.  She says that there was no evidence on imaging for osteomyelitis.  Surgery postponed. Labs (9/10): K 3.7, creatinine 0.7, BNP 153 Labs (11/10): K 3.7, creatinine 0.8, LDL 115, HDL 44, BNP 282 Labs (1/11): K 4.4, creatinine 0.9, BNP 182 Labs (3/11): creatinine 0.72   Current Medications (verified): 1)  Prednisone 10 Mg Tabs (Prednisone) .... 1/2 Tablet Daily With 3 1 Mg Tablets For A Total of 8 Mg 2)  Multivitamins   Tabs (Multiple Vitamin) .Marland Kitchen.. 1 Tab By Mouth Once Daily 3)  Tylenol 325 Mg Tabs (Acetaminophen) .... As Directed As Needed 4)  Furosemide 40 Mg Tabs (Furosemide) .... One Tablet in The Morning 5)  Plaquenil 200 Mg Tabs (Hydroxychloroquine Sulfate) .... 2 Tablets Daily 6)  Revatio 20 Mg Tabs (Sildenafil Citrate) .... Four  Tablets  Three Times A Day 7)  Potassium Chloride Crys Cr 20 Meq Cr-Tabs (Potassium Chloride Crys Cr) .... Take One Tablet By Mouth Daily 8)  Warfarin Sodium 5 Mg Tabs (Warfarin Sodium) .Marland Kitchen.. 1 Daily or As Directed 9)  Imuran 50 Mg Tabs (Azathioprine) .... Take 1 Tablet Daily.--Pt Not Currently Taking.  Given For Post Op  Allergies (verified): 1)  ! Methotrexate  Past History:  Past Medical History: Reviewed history from 02/25/2010 and no changes required. 1. Mixed connective tissue disease: diagnosis 1990. 8/10 ANA positive, anti-dsDNA positive, anti-SCL70 negative 2. 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 CHF: Secondary to moderate mitral stenosis and moderate mitral regurgitation in the setting of rheumatic mitral valve disease as well as severe pulmonary HTN likely due to a combination of MV disease and pulmonary  arterial HTN in the setting of collagen vascular disease.   Not valvuloplasty candidate due to MR.   - TEE (9/10): Rheumatic MV, moderate MR and moderate MS with mean MV gradient 13 mmHg (gradient may be in severe range due to significant MR).   - RHC (9/10): mean RA 12, PA 72/28 mean 50, mean PCWP 23, PVR 5.7 WU, mean MV gradient 12.5 mmHg and MVA 1.3 cm^2. LV-gram with mild to moderate MR.  - RHC (1/11): mean RA 8, PA 45/21 mean 30, mean PCWP 18, large v waves in wedge tracing, PVR 2.3 WU, CI 2.7.  - TTE (2/11): EF 60-65%, rheumatic MV with probably moderate to possibly severe MS (mean gradient 15 mmHg but MVA 1.46 cm^2 by PHT and 1.15 cm^2 by continuity), moderate MR.  6.  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  CTA chest (7/10) with no evidence for pulmonary emboli or chronic pulmonary emboli.   - 3/11 6 miin walk 427 meters 7.  ? childhood rheumatic fever: episode of joint pains lasting for several weeks around 46 years old.  Never had diagnosis.  8.  LHC (9/10): no angiographic CAD.  9.  Warfarin anticoagulation for pulmonary HTN.  Goal INR 1.5-2.5.   Family History: Reviewed history from 06/20/2009 and no changes required. Emphysema mother, smoker.  Grandmother with "heart disease"  Social History: Reviewed history from 11/25/2009 and no changes required. 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  Lives with father of child.   Vital Signs:  Patient profile:   45 year old female Height:      65 inches Weight:      191 pounds BMI:     31.90 Pulse rate:   79 / minute Pulse rhythm:   regular BP sitting:   107 / 81  (left arm) Cuff size:   regular  Vitals Entered By: Judithe Modest CMA (April 15, 2010 9:04 AM)  Physical Exam  General:  Well developed, well nourished, in no acute distress. Neck:  Neck supple, no JVD. No masses, thyromegaly or abnormal cervical nodes. Lungs:   Slight dry basilar crackles, otherwise clear.  Heart:  Non-displaced PMI, chest non-tender; regular rate and rhythm, S1, S2. 2/6 holosystolic murmur at apex, +S4. Carotid upstroke normal, no bruit. Pedals normal pulses. No edema.  Abdomen:  Bowel sounds positive; abdomen soft and non-tender without masses, organomegaly, or hernias noted. No hepatosplenomegaly. Extremities:  No clubbing or cyanosis. Neurologic:  Alert and oriented x 3. Psych:  Normal affect.   Impression & Recommendations:  Problem # 1:  MITRAL VALVE DISORDERS (ICD-424.0) Patient appears to have rheumatic deformity of the mitral valve (possible history of rheumatic fever at age 22).  When all the data are considered, it appears that she has moderate mitral regurgitation and moderate to severe mitral stenosis.  There were prominent V waves in the wedge  tracing on most recent RHC.  She had severe pulmonary HTN.  This was certainly due in part to mitral valve disease, but I suspect that there was a contribution from pulmonary vascular HTN from mixed connective tissue disorder.  On treatment with Revatio, pulmonary pressures have decreased considerably from mean 50 mmHg to mean 30 mmHg.  At this point, I think she would be a surgical candidate for mitral valve replacement.  I have reviewed the most recent echo, and though the mitral valve and apparatus are not severely calcified, she would not be a good valvuloplasty candidate due to moderate mitral regurgitation. She had been scheduled for mitral valve surgery in June but it has had to be rescheduled due to her finger surgery.  She goes back to Dr. [name] this month to reschedule surgery.  It is ok for her to come off warfarin for surgery, she is on it for pulmonary hypertension (goal INR 1.5-2.5).    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  Continue coumadin and Revatio.  427 meters on 6  minute walk at last visit (very good).   Patient Instructions: 1)  Your physician wants you to follow-up in: 3 months with Dr [name].  You will receive a reminder letter in the mail two months in advance. If you don't receive a letter, please call our office to schedule the follow-up appointment.

## 2010-11-12 NOTE — Letter (Signed)
Summary: [facility]   Imported By: Marylou Mccoy 10/15/2010 19:05:29  _____________________________________________________________________  External Attachment:    Type:   Image     Comment:   External Document

## 2010-11-12 NOTE — Letter (Signed)
Summary: [facility]   Imported By: Sherian Rein 06/16/2010 08:06:03  _____________________________________________________________________  External Attachment:    Type:   Image     Comment:   External Document

## 2010-11-12 NOTE — Assessment & Plan Note (Signed)
Summary: 3week f/u sl   Visit Type:  Follow-up Referring Provider:  Dr. Sherene Sires Primary Provider:  Dr. 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  She was readmitted with severe shortness of breath post-operatively and was found to have a large pericardial effusion with early tamponade.  She had a pericardial window with resolution of shortness of breath.  Prior to discharge, she developed severe right groin pain and was found by CT to have an iliopsoas hematoma on the right.  The hematoma appeared to be spontaneous in the setting of anticoagulation.    Patient still has right upper thigh pain with ambulation but it is improving.  HCT has been stable.  Initially at cardiac rehab she was getting nauseated and lightheaded.  This has resolved and she is doing rehab now with no significant problems.  She has some pulling-type pain at her surgical site that seems to be due to the weight of her breast.  She is taking Tylenol (total of 2000 mg daily) for this.  Finally, she seems to be breathing quite well.  No shortness of breath at cardiac rehab.  Of note, she has had mild sinus tachycardia post-operatively.  Weight is down 5 lbs since last appointment.  Her Imuran has been on hold since surgery.   Labs (8/11): HCT 27 => 30, creatinine 0.98, INR 4 Labs (9/11): K 4.6, creatinine 0.9, HCT 32.7, BNP 214=>146, LFTs normal, TSH normal  Current Medications (verified): 1)  Prednisone 1 Mg Tabs (Prednisone) .... 3 Tabs Once Daily 2)  Multivitamins   Tabs (Multiple Vitamin) .Marland Kitchen.. 1 Tab By Mouth Once Daily 3)  Tylenol 325 Mg Tabs (Acetaminophen) .... As Directed As Needed 4)  Furosemide 40 Mg Tabs (Furosemide) .... One Tablet in The  Morning 5)  Plaquenil 200 Mg Tabs (Hydroxychloroquine Sulfate) .... 2 Tablets Daily 6)  Revatio 20 Mg Tabs (Sildenafil Citrate) .... One Table Three Times A Day 7)  Warfarin Sodium 5 Mg Tabs (Warfarin Sodium) .Marland Kitchen.. 1 Daily or As Directed 8)  Amiodarone Hcl 200 Mg Tabs (Amiodarone Hcl) .... Take One Tablet Daily 9)  Aspirin 81 Mg Tbec (Aspirin) .... Take One Tablet By Mouth Daily 10)  Tramadol Hcl 50 Mg Tabs (Tramadol Hcl) .... As Needed 11)  Prednisone 5 Mg Tabs (Prednisone) .... Once Daily  Allergies: 1)  ! Methotrexate  Past History:  Past Medical History: Reviewed history from 06/08/2010 and no changes required. 1. Mixed connective tissue disease: diagnosis 1990. 8/10 ANA positive, anti-dsDNA positive, anti-SCL70 negative 2. 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 CHF: Secondary to moderate mitral stenosis and moderate mitral regurgitation in the setting of rheumatic mitral valve disease as well as severe pulmonary HTN likely due to a combination of MV disease and pulmonary arterial HTN in the setting of collagen vascular disease.   Not valvuloplasty candidate due to MR.  Patient had MV replacement with mechanical MV in 8/11.  Echo (8/11) post-op showed normally  functioning mechanical MV, EF 60-65% with septal bounce, small mobile structure in the mid ventricle (? loose papillary muscle), RV-RA gradient 22 mmHg.  6.  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  CTA chest (7/10) with no evidence for pulmonary emboli or chronic pulmonary emboli.  PA pressure 72/28 mean 50 with PVR 5.7 WU on initial RHC, PA pressure 45/21 mean 30 PVR 2.3 WU on f/u RHC on Revatio 80 mg three  times a day (1/11).  - 3/11 6 miin walk 427 meters 7.  ? childhood rheumatic fever: episode of joint pains lasting for several weeks around 45 years old.  Never had diagnosis.  8.  LHC (9/10): no angiographic CAD.  9.  Warfarin anticoagulation for mechanical MV, goal INR 2.5-3.5 10.  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   Family History: Reviewed history from 06/20/2009 and no changes required. Emphysema mother, smoker.  Grandmother with "heart disease"  Social History: Reviewed history from 11/25/2009 and no changes required. 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  Lives with father of child.   Review of Systems       All systems reviewed and negative except as per HPI.   Vital Signs:  Patient profile:   45 year old female Height:      65 inches Weight:      173 pounds Pulse rate:   101 / minute BP sitting:   108 / 75  (right arm)  Vitals Entered By: Laurance Flatten CMA (July 01, 2010 8:30 AM)  Physical Exam  General:  Well developed, well nourished, in no acute distress. Neck:  Neck supple, JVP 7 cm. No masses, thyromegaly or abnormal cervical nodes. Lungs:  Clear bilaterally to auscultation and percussion. Heart:  Non-displaced PMI, chest non-tender; regular rate and rhythm, S1, S2. mechanical S1.  Carotid upstroke normal, no bruit. Pedals normal pulses. No edema.  Abdomen:  Bowel sounds positive; abdomen soft and non-tender without masses, organomegaly, or hernias noted. No hepatosplenomegaly. Extremities:  No clubbing or cyanosis. Neurologic:  Alert and oriented x 3. Psych:  Normal affect.   Impression & Recommendations:  Problem # 1:  MITRAL STENOSIS WITH INSUFFICIENCY (ICD-394.2) Status post mechanical MV replacement for rheumatic MS/MR.  The valve appeared to function normally on recent echo.  For anticoagulation, patient needs ASA 81 mg daily and warfarin.  Will move INR goal to 2.5-3.5.  She  is symptomatically doing well post-operatively.  Should be able to come off amiodarone when she follows up with Dr. [name].  LFTs and TSH normal today.   Problem # 2:  CHRONIC DIASTOLIC HEART FAILURE (ICD-428.32) Patient recently presented with CHF and early tamponade.  She has had a pericardial window and volume status/symptoms are better.  Weight and BNP are down.  Continue current Lasix.   Problem # 3:  PULMONARY HYPERTENSION, SECONDARY (ICD-416.8) Pulmonary HTN was likely a mixed picture, from mitral valve disease as well as mixed connective tissue disease.  She responded well to Revatio.  Now that she has had valve surgery, hopefully pressures will be controlled off Revatio.  RV-RA gradient on last echo was only 22 mmHg.  I have asked her to discontinue Revatio.  If dyspnea returns, will need to repeat right heart cath to document PA pressure.   Other Orders: TLB-TSH (Thyroid Stimulating Hormone) ([id number]-[id number]) TLB-Hepatic/Liver Function Pnl ([id number]-[id number])  Patient Instructions: 1)  Stop Revatio 2)  Labs  today 3)  Follow up in 1 month

## 2010-11-12 NOTE — Letter (Signed)
Summary: [facility]   [facility]   Imported By: Roderic Ovens 12/18/2009 15:08:10  _____________________________________________________________________  External Attachment:    Type:   Image     Comment:   External Document

## 2010-11-12 NOTE — Cardiovascular Report (Signed)
Summary: CDC Pre-Cath Orders  CDC Pre-Cath Orders   Imported By: Kassie Mends 10/15/2009 08:29:13  _____________________________________________________________________  External Attachment:    Type:   Image     Comment:   External Document

## 2010-11-12 NOTE — Medication Information (Signed)
Summary: [facility]  Anticoagulant Therapy  Managed by: [name], PharmD Referring MD: [name] MD, [name] PCP: Dr. Azzie Roup Supervising MD: Riley Kill MD, Maisie Fus Indication 1: Pulmonary Hypertension Indication 2: Mitral Valve Replacement Lab Used: [facility] [place] Site: [address] INR POC 4.0 INR RANGE 2.5-3.5  Dietary changes: no    Health status changes: no    Bleeding/hemorrhagic complications: yes       Details: See below  Recent/future hospitalizations: yes       Details: Pt discharged from hospital last week.  Had pericardial effusion with tamponade and hemorrhagic pericarditis.  Also had hematoma on pelvic CT.  INR on discharge 8/27 was 2.69.  Per Dr. [name], prefer to keep INR closer to 2.5.   Any changes in medication regimen? yes       Details: seeing Dr. [name] on Friday.  May discontinue amiodarone at that time.   Recent/future dental: no  Any missed doses?: no       Is patient compliant with meds? yes       Allergies: 1)  ! Methotrexate  Anticoagulation Management History:      The patient is taking warfarin and comes in today for a routine follow up visit.  Negative risk factors for bleeding include an age less than 11 years old.  The bleeding index is 'low risk'.  Positive CHADS2 values include History of CHF.  Negative CHADS2 values include Age > 28 years old.  Her last INR was 7.19.  Anticoagulation responsible provider: Riley Kill MD, Maisie Fus.  INR POC: 4.0.  Exp: 07/2011.    Anticoagulation Management Assessment/Plan:      The patient's current anticoagulation dose is Warfarin sodium 5 mg tabs: 1 daily or as directed.  The target INR is 2.5-3.5.  The next INR is due 06/17/2010.  Anticoagulation instructions were given to patient.  Results were reviewed/authorized by [name], PharmD.  She was notified by [name] PharmD.         Prior Anticoagulation Instructions: INR 4.1  Skip today's dose, then begin 1/2 tablet daily except 1  tablet on Mon, Wed and Fri.  Return to clinic in 1 week.  Current Anticoagulation Instructions: INR 4.0  Skip today's dose of Coumadin then start new dose of 1/2 tablet every day except 1 tablet on Monday, Wednesday and Friday.  Recheck INR in 10 days.

## 2010-11-12 NOTE — Letter (Signed)
Summary: [facility] Office Visit   [facility] Office Visit   Imported By: Roderic Ovens 07/28/2010 11:28:02  _____________________________________________________________________  External Attachment:    Type:   Image     Comment:   External Document

## 2010-11-12 NOTE — Progress Notes (Signed)
Summary:  letter to return to Cardiac Rehab  Phone Note Call from Patient Call back at Home Phone [phone]   Caller: Patient Reason for Call: Talk to Nurse Summary of Call: PT HAS QUESTION RE CARDIAC REHAB.  Initial call taken by: Roe Coombs,  July 21, 2010 2:47 PM  Follow-up for Phone Call        I talked with pt--she will start Toprol XL 25mg  daily--I will talk with Dr [name] Thursday about pt restarting Cardiac Rehab this Friday--pt is aware of this Luana Shu     New/Updated Medications: TOPROL XL 25 MG XR24H-TAB (METOPROLOL SUCCINATE) one daily Prescriptions: TOPROL XL 25 MG XR24H-TAB (METOPROLOL SUCCINATE) one daily  #30 x 6   Entered by:   Katina Dung, RN, BSN   Authorized by:   Marca Ancona, MD   Signed by:   Katina Dung, RN, BSN on 07/21/2010   Method used:   Electronically to        [facility] [phone]* (retail)       [address]       [place], [place]  [id number]       Ph: [phone] or [phone]       Fax: [phone]   RxID:   [id number]    Current Medications (verified): 1)  Prednisone 1 Mg Tabs (Prednisone) .... 3 Tabs Once Daily 2)  Multivitamins   Tabs (Multiple Vitamin) .Marland Kitchen.. 1 Tab By Mouth Once Daily 3)  Tylenol 325 Mg Tabs (Acetaminophen) .... As Directed As Needed 4)  Furosemide 40 Mg Tabs (Furosemide) .... One Tablet in The Morning 5)  Plaquenil 200 Mg Tabs (Hydroxychloroquine Sulfate) .... 2 Tablets Daily 6)  Warfarin Sodium 5 Mg Tabs (Warfarin Sodium) .Marland Kitchen.. 1 Daily or As Directed 7)  Amiodarone Hcl 200 Mg Tabs (Amiodarone Hcl) .... Take One Tablet Daily 8)  Aspirin 81 Mg Tbec (Aspirin) .... Take One Tablet By Mouth Daily 9)  Tramadol Hcl 50 Mg Tabs (Tramadol Hcl) .... As Needed 10)  Prednisone 5 Mg Tabs (Prednisone) .... Once Daily 11)  Revatio 20 Mg Tabs (Sildenafil Citrate) .... One Three Times A Day 12)  Toprol Xl 25 Mg Xr24h-Tab (Metoprolol Succinate) .... One  Daily  Allergies: 1)  ! Methotrexate

## 2010-11-12 NOTE — Medication Information (Signed)
Summary: Revatio  Revatio   Imported By: Marylou Mccoy 10/21/2009 12:45:17  _____________________________________________________________________  External Attachment:    Type:   Image     Comment:   External Document

## 2010-11-12 NOTE — Progress Notes (Signed)
Summary: pfizer re renewal application  Phone Note Other Incoming Call back at [phone]    Caller: [employer]/brenda Summary of Call: re pt renewal application Initial call taken by: Roe Coombs,  October 20, 2010 8:49 AM  Follow-up for Phone Call        RN s/w Lattie Haw re: Revatio: Pt has not yet been approved through the [employer] but if Pt was to fill Rx through [facility] it would cost $100.00 copay for 30 day supply. Steward Drone is the counselor assigned and has not yet completed her determination of eligibility. Counselor should notify Pt and our office once the determination has been met. RN requested for a date and Lattie Haw stated Steward Drone was working on it and hoped to have it completed soon. Follow-up by: Bernita Raisin, RN, BSN,  October 20, 2010 9:16 AM  Additional Follow-up for Phone Call Additional follow up Details #1::        RN wanted to update Pt. RN Left Pt Message To Call Back. Bernita Raisin, RN, BSN  October 20, 2010 9:22 AM   Fax received today from [employer] ([employer]) that Revatio is covered with restrictions- speciality pharmacy co-pay is $100.00/ mail order co-pay is $300.00/ & retail pharmacy co-pay is $100.00. Prior authorization is on file from 06/25/10- 07/16/11. I will forward summary of benefits page to [employer]. Additional Follow-up by: Sherri Rad, RN, BSN,  October 21, 2010 3:15 PM

## 2010-11-12 NOTE — Medication Information (Signed)
Summary: rov/sp  Anticoagulant Therapy  Managed by: Bethena Midget, RN, BSN Referring MD: [name] MD, Dalton PCP: Dr. Azzie Roup Supervising MD: Riley Kill MD, Maisie Fus Indication 1: Pulmonary Hypertension Indication 2: Mitral Valve Replacement Lab Used: [facility] [place] Site: [address] INR POC 1.5 INR RANGE 2.5-3.5  Dietary changes: no    Health status changes: no    Bleeding/hemorrhagic complications: no    Recent/future hospitalizations: no    Any changes in medication regimen? no    Recent/future dental: no  Any missed doses?: no       Is patient compliant with meds? yes       Allergies: 1)  ! Methotrexate  Anticoagulation Management History:      The patient is taking warfarin and comes in today for a routine follow up visit.  Negative risk factors for bleeding include an age less than 41 years old.  The bleeding index is 'low risk'.  Positive CHADS2 values include History of CHF.  Negative CHADS2 values include Age > 43 years old.  Her last INR was 5.0 ratio.  Anticoagulation responsible provider: Riley Kill MD, Maisie Fus.  INR POC: 1.5.  Cuvette Lot#: [id number].  Exp: 11/2011.    Anticoagulation Management Assessment/Plan:      The patient's current anticoagulation dose is Warfarin sodium 5 mg tabs: 1 daily or as directed.  The target INR is 2.5-3.5.  The next INR is due 10/14/2010.  Anticoagulation instructions were given to patient.  Results were reviewed/authorized by Bethena Midget, RN, BSN.  She was notified by Bethena Midget, RN, BSN.         Prior Anticoagulation Instructions: INR 1.6  Increase dose to 1 tablet every day except 1/2 tablet on Sunday, Tuesday and Thursday.  Recheck INR in 10-14 days    Current Anticoagulation Instructions: INR 1.5 Today take 7.5mg s then change dose to 5mg s everyday except 7.5mg s on Fridays.  Recheck in 7-12 days.

## 2010-11-12 NOTE — Progress Notes (Signed)
Summary: checking on cardiac rehab  Phone Note Call from Patient Call back at Home Phone [phone]   Caller: pt Reason for Call: Talk to Nurse Summary of Call: pt calling to see if cardiac rehab has ever been set up she has not contacted about it. Initial call taken by: Faythe Ghee,  June 16, 2010 11:19 AM  Follow-up for Phone Call        talked with Byrd Hesselbach at Cardiac Rehab--they had tried to contact pt and had left a message with a family member for her to call--Maria will try to contact pt again--pt aware Cardiac Rehab to be in touch with her

## 2010-11-12 NOTE — Progress Notes (Signed)
Summary: follow-up on medication changes  Phone Note Outgoing Call   Call placed by: Katina Dung, RN, BSN,  October 21, 2009 10:43 AM Call placed to: Patient Summary of Call: follow-up on medication changes  Follow-up for Phone Call        I called pt to follow-up with her after Dr [name] increased Lasix 10-07-09 to see if her SOB was better--pt states SOB is better --right heart cath scheduled for 11-03-09--will forward to Dr [name] for review Katina Dung, RN, BSN  October 21, 2009 10:48 AM

## 2010-11-12 NOTE — Medication Information (Signed)
Summary: rov/sp  Anticoagulant Therapy  Managed by: Bethena Midget, RN, BSN Referring MD: [name] MD, Dalton PCP: Dr. Azzie Roup Supervising MD: Daleen Squibb MD, Maisie Fus Indication 1: Pulmonary Hypertension Indication 2: Mitral Valve Replacement Lab Used: [facility] [place] Site: [address] INR POC 3.2 INR RANGE 2.5-3.5  Dietary changes: no    Health status changes: no    Bleeding/hemorrhagic complications: no    Recent/future hospitalizations: no    Any changes in medication regimen? yes       Details: Restarted Revatio 20mg s TID. Amiodarone discontinued 10/6/1. Due to start Toprol today.   Recent/future dental: no  Any missed doses?: no       Is patient compliant with meds? yes       Allergies: 1)  ! Methotrexate  Anticoagulation Management History:      The patient is taking warfarin and comes in today for a routine follow up visit.  Negative risk factors for bleeding include an age less than 53 years old.  The bleeding index is 'low risk'.  Positive CHADS2 values include History of CHF.  Negative CHADS2 values include Age > 50 years old.  Her last INR was 5.0 ratio.  Anticoagulation responsible provider: Daleen Squibb MD, Maisie Fus.  INR POC: 3.2.  Cuvette Lot#: [id number].  Exp: 08/2011.    Anticoagulation Management Assessment/Plan:      The patient's current anticoagulation dose is Warfarin sodium 5 mg tabs: 1 daily or as directed.  The target INR is 2.5-3.5.  The next INR is due 08/05/2010.  Anticoagulation instructions were given to patient.  Results were reviewed/authorized by Bethena Midget, RN, BSN.  She was notified by Bethena Midget, RN, BSN.         Prior Anticoagulation Instructions: INR 2.2  Restart Coumadin on Wednesday, October 5th. Resume taking Coumadin 0.5 tab (2.5 mg) on all days. Return to clinic in 2 weeks.   Current Anticoagulation Instructions: INR 3.2 Continue 2.5mg s daily. Recheck in 2 weeks.

## 2010-11-12 NOTE — Medication Information (Signed)
Summary: Drug Assistance Program Forms  Drug Assistance Program Forms   Imported By: Kassie Mends 12/18/2009 13:23:51  _____________________________________________________________________  External Attachment:    Type:   Image     Comment:   External Document

## 2010-11-12 NOTE — Medication Information (Signed)
Summary: rov/tp  Anticoagulant Therapy  Managed by: Geoffry Paradise, PharmD Referring MD: [name] MD, [name] PCP: Dr. Azzie Roup Supervising MD: Daleen Squibb MD, Maisie Fus Indication 1: Pulmonary Hypertension Indication 2: Mitral Valve Replacement Lab Used: [facility] Site: [address] INR RANGE 2.5-3.5  Dietary changes: no    Health status changes: no    Bleeding/hemorrhagic complications: no    Recent/future hospitalizations: no         Allergies: 1)  ! Methotrexate  Anticoagulation Management History:      Negative risk factors for bleeding include an age less than 26 years old.  The bleeding index is 'low risk'.  Positive CHADS2 values include History of CHF.  Negative CHADS2 values include Age > 29 years old.  Her last INR was 15.2 ratio.  Anticoagulation responsible provider: Daleen Squibb MD, Maisie Fus.  Cuvette Lot#: [id number].  Exp: 10/2011.    Anticoagulation Management Assessment/Plan:      The patient's current anticoagulation dose is Warfarin sodium 5 mg tabs: 1 daily or as directed.  The target INR is 2.5-3.5.  The next INR is due 11/06/2010.  Anticoagulation instructions were given to patient.  Results were reviewed/authorized by Geoffry Paradise, PharmD.         Prior Anticoagulation Instructions: INR 7.54.  Spoke with pt.  She has been holding Coumadin since Friday. She is aware to continue to hold Coumadin until appt on 1/17.  Will call with any problems with any bleeding.  [name] PharmD  October 26, 2010 10:46 AM   Current Anticoagulation Instructions: INR:  4.8 (Goal 2.5 - 3.5)  Your INR is still elevated today despite 5 days of no coumadin.  Skip tonight's dose and restart coumadin on Wednesday.  Your schedule is 1 tablet everyday except 1.5 tablet on Fridays.  Return to clinic on Jan 27th, 2012 for another INR check.

## 2010-11-12 NOTE — Progress Notes (Signed)
Summary: pt is having SOB  Phone Note Call from Patient Call back at Home Phone [phone] Call back at [phone]    Caller: Patient Reason for Call: Talk to Nurse, Talk to Doctor Summary of Call: pt is having SOB and and needs to know what she can do Initial call taken by: Omer Jack,  July 08, 2010 1:06 PM  Follow-up for Phone Call        [facility] Scherrie Bateman, LPN  July 08, 2010 1:19 PM  rtn call [phone] Glynda Jaeger  July 08, 2010 1:29 PM  Pt C/O of SOB with activity. She is having some CP but believes she is just still sore from her surgery. Concerned that she needs to go back on Revatio for her SOB. Will forward this to Dr. [name] to determine if she needs to start back on Revatio or if she needs another right sided heart cath to determine PA pressures. She is not due back for an OV until late October. Whitney Maeola Sarah RN  July 08, 2010 2:04 PM  Follow-up by: Whitney Maeola Sarah RN,  July 08, 2010 1:51 PM     Appended Document: pt is having SOB I will need to do a right heart cath to evaluate her PA pressure and LV filling pressure.  Would arrange for me for a day next week in the [facility] lab.  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  Think I already have a JV case one of those days so would put on the other. Thanks.

## 2010-11-12 NOTE — Miscellaneous (Signed)
Summary: HIPAA Restrictions  HIPAA Restrictions   Imported By: Florinda Marker 09/14/2010 15:39:06  _____________________________________________________________________  External Attachment:    Type:   Image     Comment:   External Document

## 2010-11-12 NOTE — Letter (Signed)
Summary: [facility]  [facility]   Imported By: Marylou Mccoy 06/04/2010 15:53:02  _____________________________________________________________________  External Attachment:    Type:   Image     Comment:   External Document

## 2010-11-12 NOTE — Letter (Signed)
Summary: [facility]  [facility] Office Note   Imported By: Roderic Ovens 01/13/2010 11:12:03  _____________________________________________________________________  External Attachment:    Type:   Image     Comment:   External Document

## 2010-11-12 NOTE — Medication Information (Signed)
Summary: Coumadin Clinic  Anticoagulant Therapy  Managed by: [name], PharmD Referring MD: [name] MD, [name] PCP: Dr. Azzie Roup Supervising MD: Daleen Squibb MD, Maisie Fus Indication 1: Pulmonary Hypertension Indication 2: Mitral Valve Replacement Lab Used: [facility] Site: [facility] PT 63.4 INR POC 7.54 INR RANGE 2.5-3.5  Dietary changes: no    Health status changes: no    Bleeding/hemorrhagic complications: yes       Details: Pt states blood in stool is same as usual.  Has not changed with increase in INR   Any changes in medication regimen? yes       Details: Pt took 5mg  of Vitamin K on 1/13  Recent/future dental: no  Any missed doses?: yes     Details: Pt is holding Coumadin per instructions on 1/13.    Comments: Lab drawn on 1/14.  Called to [place], [place].  Unfortunately there was some miscommunication and the PA thought the Coumadin Clinic was going to get in touch with pt.  The pt was aware to hold Coumadin until appt on Tuesday.   Allergies: 1)  ! Methotrexate  Anticoagulation Management History:      Her anticoagulation is being managed by telephone today.  Negative risk factors for bleeding include an age less than 71 years old.  The bleeding index is 'low risk'.  Positive CHADS2 values include History of CHF.  Negative CHADS2 values include Age > 65 years old.  Her last INR was 15.2 ratio.  Prothrombin time is 63.4.  Anticoagulation responsible provider: Daleen Squibb MD, Maisie Fus.  INR POC: 7.54.  Exp: 11/2011.    Anticoagulation Management Assessment/Plan:      The patient's current anticoagulation dose is Warfarin sodium 5 mg tabs: 1 daily or as directed.  The target INR is 2.5-3.5.  The next INR is due 10/27/2010.  Anticoagulation instructions were given to patient.  Results were reviewed/authorized by [name], PharmD.  She was notified by [name] PharmD.         Prior Anticoagulation Instructions: INR 8.0 To lab: INR 15.2 Has not taken today.  Per [name], PharmD pt needs to hold Coumadin and take 5mg  of vit K.  Recheck on Tuesday 10/27/10. Attempted to contact pt at home#, cell # andwork # unable to contact pt.  LMOM to call back as soon as she got message. Cloyde Reams RN  October 23, 2010 3:24 PM  Spoke with pt advised to hold Coumadin until after recheck on Tuesday 10/27/10.  Called in verbal rx for Mephyton 5mg  tablet #[facility] to [facility] [address] per pt request.  advised pt to pick up rx and take ASAP.  Pt denies significant bleeding.  Advised pt to monitor closely given her elevated INR and to go to the ED with any bleeding problems. Per Dr Antoine Poche pt needs to have PT/INR repeated tomorrow.  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   Current Anticoagulation Instructions: INR 7.54.  Spoke with pt.  She has been holding Coumadin since Friday. She is aware to continue to hold Coumadin until appt on 1/17.  Will call with any problems with any bleeding.  [name] PharmD  October 26, 2010 10:46 AM

## 2010-11-12 NOTE — Medication Information (Signed)
Summary: rov/sp  Anticoagulant Therapy  Managed by: [name], PharmD Referring MD: [name] MD, [name] PCP: Dr. Azzie Roup Supervising MD: Riley Kill MD, Maisie Fus Indication 1: Pulmonary Hypertension Indication 2: Mitral Valve Replacement Lab Used: [facility] [place] Site: [address] INR POC 4.1 INR RANGE 2.5-3.5  Dietary changes: yes       Details: Not eating as many greens.  Health status changes: yes       Details: Pt complaining of increased SOB, cough and pain in R chest since earlier this week.  Called Dr. Orvan July office and they asked if one of our cardiologist would evaluate.  Spoke with Dr. Riley Kill.  WIll get CXR and add to his schedule.   Bleeding/hemorrhagic complications: no    Recent/future hospitalizations: no    Any changes in medication regimen? no    Recent/future dental: no  Any missed doses?: yes     Details: Has not taken any doses since Mon due to elevated INR  Is patient compliant with meds? yes       Allergies: 1)  ! Methotrexate  Anticoagulation Management History:      The patient is taking warfarin and comes in today for a routine follow up visit.  Negative risk factors for bleeding include an age less than 74 years old.  The bleeding index is 'low risk'.  Positive CHADS2 values include History of CHF.  Negative CHADS2 values include Age > 27 years old.  Her last INR was 7.19.  Anticoagulation responsible provider: Riley Kill MD, Maisie Fus.  INR POC: 4.1.  Cuvette Lot#: [id number].  Exp: 07/2011.    Anticoagulation Management Assessment/Plan:      The patient's current anticoagulation dose is Warfarin sodium 5 mg tabs: 1 daily or as directed.  The target INR is 2.5-3.5.  The next INR is due 06/05/2010.  Anticoagulation instructions were given to patient.  Results were reviewed/authorized by [name], PharmD.  She was notified by Liana Gerold, PharmD Candidate.         Prior Anticoagulation Instructions: INR >8.0  INR from lab:  7.19  Spoke with pt.  Hold Coumadin until appt on Friday.  [name] PharmD  May 26, 2010 8:31 AM   Current Anticoagulation Instructions: INR 4.1  Skip today's dose, then begin 1/2 tablet daily except 1 tablet on Mon, Wed and Fri.  Return to clinic in 1 week.

## 2010-11-12 NOTE — Medication Information (Signed)
Summary: rov/tm  Anticoagulant Therapy  Managed by: [name], PharmD Referring MD: [name] MD, [name] PCP: Dr. Azzie Roup Supervising MD: [name] MD, [name] Indication 1: Pulmonary Hypertension Lab Used: [facility]  Site: [address] INR POC 1.8 INR RANGE 1.5-2.5  Dietary changes: no    Health status changes: no    Bleeding/hemorrhagic complications: no    Recent/future hospitalizations: no    Any changes in medication regimen? yes       Details: Patient just started Phospha, calcium level low from Dr Esmond Plants, neurologist  Recent/future dental: no  Any missed doses?: no       Is patient compliant with meds? yes      Comments: Patient has pending heart surgey at [facility] with DR Tressie Stalker, mitral valve replacement.Patient to have appointment with Dr [name] on 03/02/10 , will contact this coumadin clinic with information from Dr [name] , instruction to hold coumadin.  Allergies: 1)  ! Methotrexate  Anticoagulation Management History:      The patient is taking warfarin and comes in today for a routine follow up visit.  Negative risk factors for bleeding include an age less than 32 years old.  The bleeding index is 'low risk'.  Positive CHADS2 values include History of CHF.  Negative CHADS2 values include Age > 68 years old.  Her last INR was 1.3 ratio.  Anticoagulation responsible provider: [name] MD, [place].  INR POC: 1.8.  Cuvette Lot#: [id number].  Exp: 05/2011.    Anticoagulation Management Assessment/Plan:      The patient's current anticoagulation dose is Warfarin sodium 5 mg tabs: 1 daily or as directed.  The target INR is 1.5-2.5.  The next INR is due 03/25/2010.  Anticoagulation instructions were given to patient.  Results were reviewed/authorized by [name], PharmD.         Prior Anticoagulation Instructions: INR 2.1 Continue 7.5mg s daily except 5mg s on Tuesdays and Thursdays. Recheck in 4 weeks.   Current Anticoagulation  Instructions: INR- 1.8 Resume normal schedule . Take 1 tablet  (5mg ) on Tuesday and Thursday and take 1.5 tablets (7.5mg ) on all other days.

## 2010-11-12 NOTE — Progress Notes (Signed)
Summary: questions re med  Phone Note Call from Patient   Caller: Patient [phone] Reason for Call: Talk to Nurse Summary of Call: pt calling re questions re med-pls call  Initial call taken by: Glynda Jaeger,  June 24, 2010 10:54 AM  Follow-up for Phone Call        phone number is [phone]---pt states she has a headache and nausea--she denies SOB --reviewed with Dr Milda Smart order CBC/BMP/BNP/Lipase/Liver profile--pt aware   she will come today for lab--     Appended Document: questions re med I talked with pt today--she is feeling much better today--she denies nausea,headache, or SOB--she is going to go to rehab tomorrow

## 2010-11-12 NOTE — Consult Note (Signed)
Summary: New Pt. Referral  New Pt. Referral   Imported By: Florinda Marker 10/26/2010 13:42:38  _____________________________________________________________________  External Attachment:    Type:   Image     Comment:   External Document

## 2010-11-12 NOTE — Medication Information (Signed)
Summary: rov/tm  Anticoagulant Therapy  Managed by: [name], PharmD Referring MD: [name] MD, [name] PCP: Dr. Azzie Roup Supervising MD: Clifton James MD, Cristal Deer Indication 1: Pulmonary Hypertension Indication 2: Mitral Valve Replacement Lab Used: [facility] [place] Site: [address] INR POC 3.3 INR RANGE 2.5-3.5  Dietary changes: no    Health status changes: no    Bleeding/hemorrhagic complications: yes       Details: small nosebleed last week.  Only affected 1 nostril  Recent/future hospitalizations: no    Any changes in medication regimen? yes       Details: stop revatio today  Recent/future dental: no  Any missed doses?: no       Is patient compliant with meds? yes       Allergies: 1)  ! Methotrexate  Anticoagulation Management History:      The patient is taking warfarin and comes in today for a routine follow up visit.  Negative risk factors for bleeding include an age less than 75 years old.  The bleeding index is 'low risk'.  Positive CHADS2 values include History of CHF.  Negative CHADS2 values include Age > 45 years old.  Her last INR was 5.5 ratio.  Anticoagulation responsible provider: Clifton James MD, Cristal Deer.  INR POC: 3.3.  Cuvette Lot#: [id number].  Exp: 07/2011.    Anticoagulation Management Assessment/Plan:      The patient's current anticoagulation dose is Warfarin sodium 5 mg tabs: 1 daily or as directed.  The target INR is 2.5-3.5.  The next INR is due 07/22/2010.  Anticoagulation instructions were given to patient.  Results were reviewed/authorized by [name], PharmD.  She was notified by [name] PharmD.         Prior Anticoagulation Instructions: INR 4.1 Skip today's dose then change dose to 2.5mg s everyday except 5mg s on Wednesdays. Recheck in 10 days.   Current Anticoagulation Instructions: INR 3.3  Continue same dose fo 1/2 tablet every day except 1 tablet on Wednesday.  Recheck INR in 3 weeks.

## 2010-11-12 NOTE — Progress Notes (Signed)
Summary: [facility]   Imported By: Harlon Flor 10/16/2009 10:22:49  _____________________________________________________________________  External Attachment:    Type:   Image     Comment:   External Document

## 2010-11-12 NOTE — Medication Information (Signed)
Summary: ccr  Anticoagulant Therapy  Managed by: [name], PharmD Referring MD: [name] MD, [name] PCP: Dr. Azzie Roup Supervising MD: [name] MD, [name] Indication 1: Pulmonary Hypertension Lab Used: [facility] [place] Site: [address] INR POC >8.0 INR RANGE 1.5-2.5  Dietary changes: yes       Details: Eating less post surgery  Health status changes: no    Bleeding/hemorrhagic complications: no    Recent/future hospitalizations: yes       Details: Recent surgery  Any changes in medication regimen? yes       Details: Amiodarone BID (Dr. [name])  Recent/future dental: no  Any missed doses?: no       Is patient compliant with meds? yes      Comments: Recent heart surgery. Started on amiodarone the week before surgery on 7/3.  Eating less since surgery. Instructed to take 7.5 mg daily until return to [facility]  Current Medications (verified): 1)  Prednisone 10 Mg Tabs (Prednisone) .... 1/2 Tablet Daily With 3 1 Mg Tablets For A Total of 8 Mg 2)  Multivitamins   Tabs (Multiple Vitamin) .Marland Kitchen.. 1 Tab By Mouth Once Daily 3)  Tylenol 325 Mg Tabs (Acetaminophen) .... As Directed As Needed 4)  Furosemide 40 Mg Tabs (Furosemide) .... One Tablet in The Morning 5)  Plaquenil 200 Mg Tabs (Hydroxychloroquine Sulfate) .... 2 Tablets Daily 6)  Revatio 20 Mg Tabs (Sildenafil Citrate) .... Four  Tablets  Three Times A Day 7)  Potassium Chloride Crys Cr 20 Meq Cr-Tabs (Potassium Chloride Crys Cr) .... Take One Tablet By Mouth Daily 8)  Warfarin Sodium 5 Mg Tabs (Warfarin Sodium) .Marland Kitchen.. 1 Daily or As Directed 9)  Imuran 50 Mg Tabs (Azathioprine) .... Take 1 Tablet Daily.--Pt Not Currently Taking.  Given For Post Op  Allergies (verified): 1)  ! Methotrexate   Anticoagulation Management History:      The patient is taking warfarin and comes in today for a routine follow up visit.  Negative risk factors for bleeding include an age less than 64 years old.  The bleeding  index is 'low risk'.  Positive CHADS2 values include History of CHF.  Negative CHADS2 values include Age > 58 years old.  Her last INR was 1.3 ratio and today's INR is 7.19.  Anticoagulation responsible provider: [name] MD, [name].  INR POC: >8.0.  Cuvette Lot#: [id number].  Exp: 06/2011.    Anticoagulation Management Assessment/Plan:      The patient's current anticoagulation dose is Warfarin sodium 5 mg tabs: 1 daily or as directed.  The target INR is 1.5-2.5.  The next INR is due 05/29/2010.  Anticoagulation instructions were given to patient.  Results were reviewed/authorized by [name], PharmD.  She was notified by [name], PharmD.         Prior Anticoagulation Instructions: INR 2.6 Today take 5mg s then resume 7.5mg s everyday except 5mg s on Tuesdays and Thursdays. Take last dose of coumadin on 04/23/10 to be off one week before surgery.   Current Anticoagulation Instructions: INR >8.0  INR from lab: 7.19  Spoke with pt.  Hold Coumadin until appt on Friday.  [name] PharmD  May 26, 2010 8:31 AM

## 2010-11-12 NOTE — Progress Notes (Signed)
Summary: need medication ordered  Phone Note Call from Patient Call back at Home Phone [phone]   Caller: Patient Summary of Call: Pt calling regarding her medication( Revatio) has not been sent in need it ordered  Initial call taken by: Judie Grieve,  Mar 02, 2010 3:16 PM  Follow-up for Phone Call        Needs [employer] application sent back for medication to be filled. Pt has filled out renewal for med. Application needs to be renewed every year. Pt states she cannot afford copay. Please call pt back about application and medication. Marrion Coy, CNA  Mar 02, 2010 3:38 PM  talked with pt--[employer] does not have copy of renewal application faxed to them 10-07-09--I call [employer] and faxed renewal application LM for pt to call me back Luana Shu  Follow-up by: Marrion Coy, CNA,  Mar 02, 2010 3:38 PM  Additional Follow-up for Phone Call Additional follow up Details #1::        I talked with [employer], Nolon Rod stated she had received the renewal application for Revatio and the renewal was good until 12/11--I talked with pt and she is aware that renewal application has been received

## 2010-11-12 NOTE — Assessment & Plan Note (Signed)
Summary: eph   Primary Provider:  Dr. Azzie Roup  CC:  eph.  Pt states she is a little SOB and she is still having pain the right leg radiating from the groin.  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  She was readmitted with severe shortness of breath post-operatively and was found to have a large pericardial effusion with early tamponade.  She had a pericardial window with resolution of shortness of breath.  Prior to discharge, she developed severe right groin pain and was found by CT to have an iliopsoas hematoma on the right.  The right groin site accessed during her mitral valve surgery did not show any sign of complication. The hematoma appeared to be spontaneous in the setting of anticoagulation.  Since discharge, patient has continued to have groin pain that is limiting her ambulation.  She had transfusion of 1 unit PRBCs prior to discharge.  She is walking with a walker primarily because of the pain.  She can walk up to a block before getting mildly winded.  No orthopnea.  BP is 90/60 today.  Patient denies any lightheadedness.   Labs (8/11): HCT 27 => 30 (today), creatinine 0.98, INR 4  Current Medications (verified): 1)  Prednisone 10 Mg Tabs (Prednisone) .... Take 1 Tablet By Mouth Once A Day 2)  Multivitamins   Tabs (Multiple Vitamin) .Marland Kitchen.. 1 Tab By Mouth Once Daily 3)  Tylenol 325 Mg Tabs (Acetaminophen) .... As Directed As Needed 4)  Furosemide 40 Mg Tabs (Furosemide) .... One Tablet in The Morning 5)  Plaquenil 200 Mg Tabs (Hydroxychloroquine Sulfate) .... 2 Tablets Daily 6)  Revatio 20 Mg Tabs (Sildenafil Citrate) .... Two Tablets  Three Times A Day 7)  Warfarin Sodium 5 Mg Tabs (Warfarin Sodium) .Marland Kitchen.. 1 Daily or As  Directed 8)  Amiodarone Hcl 200 Mg Tabs (Amiodarone Hcl) .... Take One Tablet By Mouth Twice A Day 9)  Aspirin 81 Mg Tbec (Aspirin) .... Take One Tablet By Mouth Daily 10)  Tramadol Hcl 50 Mg Tabs (Tramadol Hcl) .... Take 1 To 2 Tablets Every 4 Hrs 11)  Mucinex 600 Mg Xr12h-Tab (Guaifenesin) .... Take One Tablet Two Times A Day  Allergies (verified): 1)  ! Methotrexate  Past History:  Past Medical History: 1. Mixed connective tissue disease: diagnosis 1990. 8/10 ANA positive, anti-dsDNA positive, anti-SCL70 negative 2. 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 CHF: Secondary to moderate mitral stenosis and moderate mitral regurgitation in the setting of rheumatic mitral valve disease as well as severe pulmonary HTN likely due to a combination of MV disease and pulmonary arterial HTN in the setting of collagen vascular disease.   Not valvuloplasty candidate due to MR.  Patient had MV replacement with mechanical MV in 8/11.  Echo (8/11) post-op showed normally functioning mechanical MV, EF 60-65% with septal bounce, small mobile structure in the mid ventricle (? loose papillary muscle), RV-RA gradient 22 mmHg.  6.  Pulmonary HTN: Probably  secondary to combination of rheumatic MV disease and pulmonary arterial HTN from collagen vascular disease.  CTA chest (7/10) with no evidence for pulmonary emboli or chronic pulmonary emboli.  PA pressure 72/28 mean 50 with PVR 5.7 WU on initial RHC, PA pressure 45/21 mean 30 PVR 2.3 WU on f/u RHC on Revatio 80 mg three times a day (1/11).  - 3/11 6 miin walk 427 meters 7.  ? childhood rheumatic fever: episode of joint pains lasting for several weeks around 45 years old.  Never had diagnosis.  8.  LHC (9/10): no angiographic CAD.    9.  Warfarin anticoagulation for mechanical MV, goal INR 2.5-3.5 10.  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   Family History: Reviewed history from 06/20/2009 and no changes required. Emphysema mother, smoker.  Grandmother with "heart disease"  Social History: Reviewed history from 11/25/2009 and no changes required. 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  Lives with father of child.   Review of Systems       All systems reviewed and negative except as per HPI.   Vital Signs:  Patient profile:   45 year old female Height:      65 inches Weight:      178 pounds BMI:     29.73 Pulse rate:   103 / minute Pulse rhythm:   regular BP sitting:   90 / 60  (left arm) Cuff size:   large  Vitals Entered By: Judithe Modest CMA (June 08, 2010 10:42 AM)  Physical Exam  General:  Well developed, well nourished, in no acute distress. Neck:  Neck supple, JVP 8 cm. No masses, thyromegaly or abnormal cervical nodes. Lungs:  Mildly decreased breath sounds at the bases bilaterally.  Heart:  Non-displaced PMI, chest non-tender; regular rate and rhythm, S1, S2. mechanical S1.  Carotid upstroke normal, no bruit. Pedals normal pulses. No edema.  Abdomen:  Bowel sounds positive; abdomen soft and non-tender without masses, organomegaly, or hernias noted. No hepatosplenomegaly. Extremities:  No clubbing or cyanosis. Neurologic:  Alert and oriented x 3. Psych:  Normal affect.   Impression & Recommendations:  Problem # 1:  MITRAL STENOSIS WITH INSUFFICIENCY (ICD-394.2) Status post mechanical MV replacement for rheumatic MS/MR.  The valve appeared to function normally on recent echo.  For anticoagulation, patient needs ASA 81 mg daily and warfarin.  For now, would keep INR close to 2.5 given recent iliopsoas hematoma.  HCT is stable today compared to hospital discharge.  Eventual goal INR 2.5-3.5.  She is on amiodarone post-valve  surgery.  Given her hypotension, I am going to decrease it to 200 mg daily.  She will eventually come off it.   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  She responded well to Revatio.  Now that she has had valve surgery, hopefully pressures will be controlled off Revatio.  RV-RA gradient on last echo was only 22 mmHg.  I am going to have her decrease Revatio to 20 mg three times a day today with eventual goal of discontinuation, possibly when I see her next.    Problem # 3:  CHRONIC DIASTOLIC HEART FAILURE (ICD-428.32) Patient recently presented with CHF and early tamponade.  She has had a pericardial window and volume status/symptoms are better.  Will get BNP today.  Continue current Lasix dose.   Problem # 4:  ILIOPSOAS HEMATOMA Apparently spontaneous in the setting of coumadin. Still with groin pain, but improved.  HCT is higher than at discharge.  Aim for INR around 2.5 for now as hematoma heals.    Other Orders: Cardiac Rehabilitation (Cardiac Rehab) TLB-BMP (Basic Metabolic Panel-BMET) (80048-METABOL) TLB-BNP (B-Natriuretic Peptide) (83880-BNPR) TLB-CBC Platelet - w/Differential (85025-CBCD)  Patient Instructions: 1)  Your physician has recommended you make the following change in your medication:  2)  Decrease Amiodarone to 200mg  daily. 3)  Decrease Revatio to 20mg  three times a day. 4)  Your physician recommends that you have  lab work today--BMP/BNP/CBC  428.32  443.0 5)  Your physician recommends that you schedule a follow-up appointment in: 3 weeks with Dr [name]. 6)  Your physician recommends referral and attendance at a Cardiac Rehab Program. Prescriptions: AMIODARONE HCL 200 MG TABS (AMIODARONE HCL) Take one tablet daily  #30 x 6   Entered by:   Katina Dung, RN, BSN   Authorized by:   Marca Ancona, MD   Signed by:   Katina Dung, RN, BSN on  06/08/2010   Method used:   Electronically to        [facility] [phone]* (retail)       [address]       [place], [place]  [id number]       Ph: [phone] or [phone]       Fax: [phone]   RxID:   [id number]

## 2010-11-12 NOTE — Medication Information (Signed)
Summary: rov/eh  Anticoagulant Therapy  Managed by: Cloyde Reams, RN, BSN Referring MD: [name] MD, Kara Melching PCP: Dr. Azzie Roup Supervising MD: [name] MD, [name] Indication 1: Pulmonary Hypertension Lab Used: [facility] [place] Site: [address] INR POC 1.2 INR RANGE 1.5-2.5        Any missed doses?: yes     Details: Had cath on 11/03/09.  Held coumadin prior to procedure.      Allergies: 1)  ! Methotrexate  Anticoagulation Management History:      Negative risk factors for bleeding include an age less than 83 years old.  The bleeding index is 'low risk'.  Positive CHADS2 values include History of CHF.  Negative CHADS2 values include Age > 71 years old.  Her last INR was 1.3 ratio.  Anticoagulation responsible provider: [name] MD, Eydan Chianese.  INR POC: 1.2.  Exp: 01/2011.    Anticoagulation Management Assessment/Plan:      The patient's current anticoagulation dose is Warfarin sodium 5 mg tabs: 1 daily or as directed.  The target INR is 1.5-2.5.  The next INR is due 11/17/2009.  Anticoagulation instructions were given to patient.  Results were reviewed/authorized by Cloyde Reams, RN, BSN.  She was notified by Cloyde Reams RN.         Prior Anticoagulation Instructions: INR 1.3  Increase dose to 1 tablet daily except 1.5 tablets on Mondays, Wednesdays, and Fridays. Recheck 1/31.  Current Anticoagulation Instructions: INR 1.2  Take an extra 1/2 tablet today (2 tablets) and tomorrow (1.5 tablets) and then resume same dosage 1 tablet daily except 1.5 tablets on Mondays, Wednesdays, and Fridays.  Recheck in 1 week.

## 2010-11-12 NOTE — Progress Notes (Signed)
Summary: calling back   Phone Note Call from Patient Call back at Home Phone [phone]   Caller: Patient [phone] call after 1 p.m. Reason for Call: Talk to Nurse Details for Reason: rtn call back  Initial call taken by: Lorne Skeens,  September 21, 2010 10:52 AM  Follow-up for Phone Call        Voice Mail  Katina Dung, RN, BSN  September 21, 2010 1:05 PM  Pt returing call Judie Grieve  September 21, 2010 1:16 PM --I talked with pt

## 2010-11-17 ENCOUNTER — Encounter: Payer: Self-pay | Admitting: Cardiology

## 2010-11-17 ENCOUNTER — Ambulatory Visit (INDEPENDENT_AMBULATORY_CARE_PROVIDER_SITE_OTHER): Payer: BC Managed Care – PPO | Admitting: Cardiology

## 2010-11-17 DIAGNOSIS — I059 Rheumatic mitral valve disease, unspecified: Secondary | ICD-10-CM

## 2010-11-17 DIAGNOSIS — I279 Pulmonary heart disease, unspecified: Secondary | ICD-10-CM

## 2010-11-18 NOTE — Medication Information (Signed)
Summary: ROV  Anticoagulant Therapy  Managed by: Cloyde Reams, RN, BSN Referring MD: [name] MD, [place] PCP: Dr. Azzie Roup Supervising MD: Daleen Squibb MD, Maisie Fus Indication 1: Pulmonary Hypertension Indication 2: Mitral Valve Replacement Lab Used: [facility] Site: [address] INR POC 4.2 INR RANGE 2.5-3.5  Dietary changes: no    Health status changes: no    Bleeding/hemorrhagic complications: no    Recent/future hospitalizations: no    Any changes in medication regimen? yes       Details: Started on Flagyl x 1.5 weeks ago, rx was a 14 day course.    Recent/future dental: no  Any missed doses?: no       Is patient compliant with meds? yes       Allergies: 1)  ! Methotrexate  Anticoagulation Management History:      The patient is taking warfarin and comes in today for a routine follow up visit.  Negative risk factors for bleeding include an age less than 51 years old.  The bleeding index is 'low risk'.  Positive CHADS2 values include History of CHF.  Negative CHADS2 values include Age > 42 years old.  Her last INR was 15.2 ratio.  Anticoagulation responsible provider: Daleen Squibb MD, Maisie Fus.  INR POC: 4.2.  Cuvette Lot#: [id number].  Exp: 10/2011.    Anticoagulation Management Assessment/Plan:      The patient's current anticoagulation dose is Warfarin sodium 5 mg tabs: 1 daily or as directed.  The target INR is 2.5-3.5.  The next INR is due 11/19/2010.  Anticoagulation instructions were given to patient.  Results were reviewed/authorized by Cloyde Reams, RN, BSN.  She was notified by Cloyde Reams RN.         Prior Anticoagulation Instructions: INR:  4.8 (Goal 2.5 - 3.5)  Your INR is still elevated today despite 5 days of no coumadin.  Skip tonight's dose and restart coumadin on Wednesday.  Your schedule is 1 tablet everyday except 1.5 tablet on Fridays.  Return to clinic on Jan 27th, 2012 for another INR check.    Current Anticoagulation Instructions: INR 4.2  Skip  today's dosage of Coumadin, then resume same dosage 1 tablet daily except 1.5 tablets on Fridays.  Recheck in 10 days.

## 2010-11-18 NOTE — Medication Information (Signed)
Summary: Lab Orders  Lab Orders   Imported By: Marylou Mccoy 11/11/2010 17:25:42  _____________________________________________________________________  External Attachment:    Type:   Image     Comment:   External Document

## 2010-11-18 NOTE — Letter (Signed)
Summary: [facility] Office Note   [facility] Office Note   Imported By: Roderic Ovens 11/12/2010 12:28:31  _____________________________________________________________________  External Attachment:    Type:   Image     Comment:   External Document

## 2010-11-18 NOTE — Letter (Signed)
Summary: Healthcare Provider Enrollment Form   Healthcare Provider Enrollment Form   Imported By: Roderic Ovens 11/10/2010 15:18:00  _____________________________________________________________________  External Attachment:    Type:   Image     Comment:   External Document

## 2010-11-20 ENCOUNTER — Encounter (INDEPENDENT_AMBULATORY_CARE_PROVIDER_SITE_OTHER): Payer: BC Managed Care – PPO

## 2010-11-20 ENCOUNTER — Encounter: Payer: Self-pay | Admitting: Cardiology

## 2010-11-20 DIAGNOSIS — Z7901 Long term (current) use of anticoagulants: Secondary | ICD-10-CM

## 2010-11-20 DIAGNOSIS — I059 Rheumatic mitral valve disease, unspecified: Secondary | ICD-10-CM

## 2010-11-25 ENCOUNTER — Encounter: Payer: Self-pay | Admitting: Cardiology

## 2010-11-25 NOTE — Assessment & Plan Note (Signed)
OFFICE VISIT  Katie [name], Katie [name] DOB:  [birth year]                                        October 29, 2010 CHART #:  [id number]  Katie [name] was sent to have her stool tested for C. difficile after her office visit this week.  Two out of three stool samples were positive for C. difficile.  We plan to treat her with oral Flagyl 500 mg 3 times daily.  If she does not respond to Flagyl, we will need to think about changing her to oral vancomycin.  We will contact her by telephone and call the prescription immediately.  Salvatore Decent. [name], M.D. Electronically Signed  CHO/MEDQ  D:  10/29/2010  T:  10/29/2010  Job:  [id number]  cc:   Marca Ancona, MD Lurena Nida, MD

## 2010-11-26 NOTE — Assessment & Plan Note (Signed)
Summary: F3M/PER PT CALL-MJ/AMD   Visit Type:  Follow-up Primary Provider:  Dr. Thea Silversmith  CC:  swelling in feet.  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  She was found to have a Pseudomonas breast abscess with involvement of the sternum (osteomyelitis) this fall and was treated with antibiotics with resolution.  She also developed severe diarrhea in 1/12 that was found to be C. difficile.  She was treated with a course of Flagyl.  The diarrhea has resolved and she is feeling better.    Patient is actually doing quite well symptomatically.  She is able to walk without any shortness of breath on flat ground and up a flight of steps.  She can lift and carry her son.  She is back at work.  Main complaint is a chronic dry cough.  Weight is down 5 l bs since last appointment.   Today we did a 6 minute walk.  She went 427 meters.   Labs (8/11): HCT 27 => 30, creatinine 0.98, INR 4 Labs (9/11): K 4.6, creatinine 0.9, HCT 32.7, BNP 214=>146, LFTs normal, TSH normal Labs (1/12): HCT 37.9, K 3.5, creatinine 0.9  ECG: NSR, LAE, LAFB  Current Medications (verified): 1)  Prednisone 1 Mg Tabs (Prednisone) .... 3 Tabs Once Daily 2)  Multivitamins   Tabs (Multiple Vitamin) .Marland Kitchen.. 1 Tab By Mouth Once Daily 3)  Tylenol 325 Mg Tabs (Acetaminophen) .... As Directed As Needed 4)  Furosemide 40 Mg Tabs (Furosemide) .... One-Half  Tablet in The Morning 5)  Plaquenil 200 Mg Tabs (Hydroxychloroquine Sulfate) .... 2 Tablets Daily 6)  Warfarin Sodium 5 Mg Tabs (Warfarin Sodium) .Marland Kitchen.. 1 Daily or As Directed 7)  Aspirin 81 Mg Tbec (Aspirin) .... Take One Tablet By Mouth Daily 8)  Prednisone 5 Mg Tabs (Prednisone) .... Once Daily 9)  Revatio 20 Mg Tabs (Sildenafil  Citrate) .... One Three Times A Day 10)  Toprol Xl 25 Mg Xr24h-Tab (Metoprolol Succinate) .... One-Half Tablet  Daily  Allergies (verified): 1)  ! Methotrexate  Past History:  Past Medical History: 1. Mixed connective tissue disease: diagnosis 1990. 8/10 ANA positive, anti-dsDNA positive, anti-SCL70 negative 2. 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 CHF: Secondary to moderate mitral stenosis and moderate mitral regurgitation in the setting of rheumatic mitral valve disease as well as severe pulmonary HTN likely due to a combination of MV disease and pulmonary arterial HTN in the setting of collagen vascular disease.   Not valvuloplasty candidate due to MR.  Patient had MV replacement with mechanical MV in 8/11.  Echo (8/11) post-op showed normally functioning mechanical MV, EF 60-65% with septal bounce, small mobile structure in the mid ventricle (? loose papillary muscle), RV-RA gradient 22 mmHg.  Echo (10/11): EF 60-65%, normally functioning mechanical mitral valve.  6.  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  CTA chest (7/10) with no evidence for pulmonary emboli or chronic pulmonary emboli.  PA pressure 72/28 mean 50 with PVR 5.7 WU on initial RHC, PA pressure 45/21 mean 30 PVR 2.3 WU on f/u RHC on Revatio 80 mg three times a day (1/11).  After MV surgery, Revatio stopped.  Increased dyspnea.  Repeat RHC with mean RA 6, PA 42/20 (mean 29), PVR 4.8 WU, CI 2.3.  Revatio 20 mg three times a day restarted.  - 3/11 6 min walk 427 m - 2/12 6 min walk 427 m 7.  Rheumatic fever 8.  LHC (9/10): no angiographic CAD.  9.  Warfarin anticoagulation for mechanical MV, goal INR  2.5-3.5 10.  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  11. 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 C difficile diarrhea 1/12  Family History: Reviewed history from 06/20/2009 and no changes required. Emphysema mother, smoker.  Grandmother with "heart disease"  Social History: Reviewed history from 11/25/2009 and no changes required. 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  Lives with father of child.   Review of Systems       All systems reviewed and negative except as per HPI.   Vital Signs:  Patient profile:   45 year old female Menstrual status:  regular Height:      65 inches Weight:      163 pounds BMI:     27.22 Pulse rate:   93 / minute BP sitting:   100 / 62  (left arm) Cuff size:   regular  Vitals Entered By: Caralee Ates CMA (November 17, 2010 11:12 AM)  Serial Vital Signs/Assessments:  Comments: 6 minute walk test---pt walked 14 lengths of 100 feet in 6 minutes   Anne Lankford,RN By: Katina Dung, RN, BSN    Physical Exam  General:  Well developed, well nourished, in no acute distress. Neck:  Neck supple, no JVD. No masses, thyromegaly or abnormal cervical nodes. Lungs:  Clear bilaterally to auscultation and percussion. Heart:  Non-displaced PMI,  regular rate and rhythm, S1, S2. mechanical S1.  Carotid upstroke normal, no bruit. Pedals normal pulses. No edema.  Abdomen:  Bowel sounds positive; abdomen soft and non-tender without masses, organomegaly, or hernias noted. No hepatosplenomegaly. Extremities:  No clubbing or cyanosis. Neurologic:  Alert and oriented x 3. Psych:  Normal affect.   Impression & Recommendations:  Problem # 1:  MITRAL STENOSIS (ICD-394.0) Status post mitral valve replacement, stable from valve standpoint.  She is on coumadin goal INR 2.5 - 3.5 and ASA 81 mg daily.   Problem # 2:  PULMONARY HYPERTENSION, SECONDARY (ICD-416.8) Pulmonary HTN  is likely a mixed picture, from mitral valve disease as well as mixed connective tissue disease.  She had mild residual pulmonary HTN with moderately increased PVR even after mitral valve surgery on last RHC (though this is improved compared to initial RHC).  She is doing well on Revatio 20 mg three times a day.  She did quite well on 6 minute walk today (427 meters).  Continue current therapy.   Problem # 3:  COUGH Chronic dry cough.  Has had GERD in the past.   Will have her try omeprazole.   Problem # 4:  PSEUDOMONAS INFECTION (ICD-041.7) Pseudomonal sternal osteomyelitis and breast abscess.  She has recovered from this and has no further significant chest pain.   Patient Instructions: 1)  Your physician has recommended you make the following change in your medication:  2)  Take Omeprazole 20mg  daily. 3)  Your physician recommends that you schedule  a follow-up appointment in: 4 months with Dr [name].

## 2010-12-01 ENCOUNTER — Other Ambulatory Visit: Payer: Self-pay | Admitting: Gastroenterology

## 2010-12-01 ENCOUNTER — Encounter: Payer: Self-pay | Admitting: Cardiology

## 2010-12-02 NOTE — Miscellaneous (Signed)
  Clinical Lists Changes  Medications: Added new medication of ENOXAPARIN SODIUM 80 MG/0.8ML SOLN (ENOXAPARIN SODIUM) Inject 1 syringe every 12 hours as directed. - Signed Rx of ENOXAPARIN SODIUM 80 MG/0.8ML SOLN (ENOXAPARIN SODIUM) Inject 1 syringe every 12 hours as directed.;  #20 x 0;  Signed;  Entered by: [name] PharmD;  Authorized by: Marca Ancona, MD;  Method used: [facility] to [facility] [facility] [address] [phone]*, [address], [place], [place], [place]  [id number], Ph: [phone] or [phone], Fax: [phone]    Prescriptions: ENOXAPARIN SODIUM 80 MG/0.8ML SOLN (ENOXAPARIN SODIUM) Inject 1 syringe every 12 hours as directed.  #20 x 0   Entered by:   [name] PharmD   Authorized by:   Marca Ancona, MD   Signed by:   [name] PharmD on 11/25/2010   Method used:   [facility] to        [facility] [phone]* (retail)       [address]       [place], [place]  [id number]       Ph: [phone] or [phone]       Fax: [phone]   RxID:   [id number]

## 2010-12-02 NOTE — Medication Information (Signed)
Summary: Coumadin Clinic  Anticoagulant Therapy  Managed by: [name], PharmD Referring MD: [name] MD, [name] PCP: Dr. Thea Silversmith Supervising MD: Daleen Squibb MD, Maisie Fus Indication 1: Pulmonary Hypertension Indication 2: Mitral Valve Replacement Lab Used: [facility] Site: [address] INR POC 5.0 INR RANGE 2.5-3.5  Dietary changes: yes       Details: diet decreased due to GI symptoms  Health status changes: yes       Details: having colonoscopy on 2/21.  Still having some loose stools but less diarrhea  Bleeding/hemorrhagic complications: no    Recent/future hospitalizations: no    Any changes in medication regimen? yes       Details: finished with flagyl last week  Recent/future dental: no  Any missed doses?: no       Is patient compliant with meds? yes       Allergies: 1)  ! Methotrexate  Anticoagulation Management History:      The patient is taking warfarin and comes in today for a routine follow up visit.  Negative risk factors for bleeding include an age less than 87 years old.  The bleeding index is 'low risk'.  Positive CHADS2 values include History of CHF.  Negative CHADS2 values include Age > 23 years old.  Her last INR was 15.2 ratio.  Anticoagulation responsible provider: Daleen Squibb MD, Maisie Fus.  INR POC: 5.0.  Exp: 10/2011.    Anticoagulation Management Assessment/Plan:      The patient's current anticoagulation dose is Warfarin sodium 5 mg tabs: 1 daily or as directed.  The target INR is 2.5-3.5.  The next INR is due 12/07/2010.  Anticoagulation instructions were given to patient.  Results were reviewed/authorized by [name], PharmD.  She was notified by [name] PharmD.         Prior Anticoagulation Instructions: INR 4.2  Skip today's dosage of Coumadin, then resume same dosage 1 tablet daily except 1.5 tablets on Fridays.  Recheck in 10 days.    Current Anticoagulation Instructions: INR 5.0  Skip today and tomorrow's dose of Coumadin then decrease dose to  1 tablet every day.  We will call you with instructions for your procedure.   2/16- Last dose of Coumadin 2/17- No Coumadin or Lovenox 2/18- Lovenox 80mg  in PM 2/19- Lovenox 80mg  in AM and PM 2/20- Lovenox 80mg  in AM only 2/21- Day of Procedure.   Take 1 1/2 tablets of Coumadin x 2 days then resume 1 tablet daily.  Restart Lovenox 80mg  two times a day.   2/27- Recheck INR.

## 2010-12-02 NOTE — Progress Notes (Signed)
Summary: [facility]: Office Visit  [facility]: Office Visit   Imported By: Earl Many 11/23/2010 11:04:13  _____________________________________________________________________  External Attachment:    Type:   Image     Comment:   External Document

## 2010-12-02 NOTE — Progress Notes (Signed)
Summary: [facility]: Office Visit  [facility]: Office Visit   Imported By: Earl Many 11/23/2010 18:33:39  _____________________________________________________________________  External Attachment:    Type:   Image     Comment:   External Document

## 2010-12-04 ENCOUNTER — Encounter: Payer: Self-pay | Admitting: Cardiology

## 2010-12-04 DIAGNOSIS — I059 Rheumatic mitral valve disease, unspecified: Secondary | ICD-10-CM

## 2010-12-07 ENCOUNTER — Encounter: Payer: Self-pay | Admitting: Cardiology

## 2010-12-07 ENCOUNTER — Encounter (INDEPENDENT_AMBULATORY_CARE_PROVIDER_SITE_OTHER): Payer: BC Managed Care – PPO

## 2010-12-07 DIAGNOSIS — Z7901 Long term (current) use of anticoagulants: Secondary | ICD-10-CM

## 2010-12-07 DIAGNOSIS — I270 Primary pulmonary hypertension: Secondary | ICD-10-CM

## 2010-12-07 DIAGNOSIS — I059 Rheumatic mitral valve disease, unspecified: Secondary | ICD-10-CM

## 2010-12-07 LAB — CONVERTED CEMR LAB: POC INR: 1.8

## 2010-12-10 ENCOUNTER — Encounter (INDEPENDENT_AMBULATORY_CARE_PROVIDER_SITE_OTHER): Payer: BC Managed Care – PPO

## 2010-12-10 ENCOUNTER — Encounter: Payer: Self-pay | Admitting: Internal Medicine

## 2010-12-10 DIAGNOSIS — I2789 Other specified pulmonary heart diseases: Secondary | ICD-10-CM

## 2010-12-10 DIAGNOSIS — Z7901 Long term (current) use of anticoagulants: Secondary | ICD-10-CM

## 2010-12-10 DIAGNOSIS — I059 Rheumatic mitral valve disease, unspecified: Secondary | ICD-10-CM

## 2010-12-10 LAB — CONVERTED CEMR LAB: POC INR: 2.2

## 2010-12-16 ENCOUNTER — Telehealth: Payer: Self-pay | Admitting: Cardiology

## 2010-12-17 NOTE — Medication Information (Signed)
Summary: rov/tm  Anticoagulant Therapy  Managed by: Windell Hummingbird, RN Referring MD: [name] MD, Dalton PCP: Dr. Laure Kidney MD: Tenny Craw MD, Gunnar Fusi Indication 1: Pulmonary Hypertension Indication 2: Mitral Valve Replacement Lab Used: [facility] [place] Site: [address] INR POC 2.2 INR RANGE 2.5-3.5  Dietary changes: no    Health status changes: no    Bleeding/hemorrhagic complications: no    Recent/future hospitalizations: no    Any changes in medication regimen? no    Recent/future dental: no  Any missed doses?: no       Is patient compliant with meds? yes       Allergies: 1)  ! Methotrexate  Anticoagulation Management History:      The patient is taking warfarin and comes in today for a routine follow up visit.  Negative risk factors for bleeding include an age less than 86 years old.  The bleeding index is 'low risk'.  Positive CHADS2 values include History of CHF.  Negative CHADS2 values include Age > 31 years old.  Her last INR was 15.2 ratio.  Anticoagulation responsible provider: Tenny Craw MD, Gunnar Fusi.  INR POC: 2.2.  Cuvette Lot#: [id number].  Exp: 10/2011.    Anticoagulation Management Assessment/Plan:      The patient's current anticoagulation dose is Warfarin sodium 5 mg tabs: 1 daily or as directed.  The target INR is 2.5-3.5.  The next INR is due 12/24/2010.  Anticoagulation instructions were given to patient.  Results were reviewed/authorized by Windell Hummingbird, RN.  She was notified by Windell Hummingbird, RN.         Prior Anticoagulation Instructions: INR 1.8 Today and tomorrow take 1.5 pills then resume 1 pill everyday. Continue Lovenox injection every 12 hours unitl Wednesday night.   Current Anticoagulation Instructions: INR 2.2 Take 1 1/2 tablets today and tomorrow.  Then resume taking 1 tablet every day. Recheck in 2 weeks.

## 2010-12-17 NOTE — Medication Information (Signed)
Summary: rov/sp  Anticoagulant Therapy  Managed by: Bethena Midget, RN, BSN Referring MD: [name] MD, Dalton PCP: Dr. Laure Kidney MD: Jens Som MD, Arlys John Indication 1: Pulmonary Hypertension Indication 2: Mitral Valve Replacement Lab Used: [facility] [place] Site: [address] INR POC 1.8 INR RANGE 2.5-3.5  Dietary changes: no    Health status changes: no    Bleeding/hemorrhagic complications: yes       Details: small bruise on rt upper abd quad  from Lovenox injection  Recent/future hospitalizations: no    Any changes in medication regimen? no    Recent/future dental: no  Any missed doses?: no       Is patient compliant with meds? yes       Allergies: 1)  ! Methotrexate  Anticoagulation Management History:      The patient is taking warfarin and comes in today for a routine follow up visit.  Negative risk factors for bleeding include an age less than 30 years old.  The bleeding index is 'low risk'.  Positive CHADS2 values include History of CHF.  Negative CHADS2 values include Age > 3 years old.  Her last INR was 15.2 ratio.  Anticoagulation responsible provider: Jens Som MD, Arlys John.  INR POC: 1.8.  Cuvette Lot#: [id number].  Exp: 10/2011.    Anticoagulation Management Assessment/Plan:      The patient's current anticoagulation dose is Warfarin sodium 5 mg tabs: 1 daily or as directed.  The target INR is 2.5-3.5.  The next INR is due 12/10/2010.  Anticoagulation instructions were given to patient.  Results were reviewed/authorized by Bethena Midget, RN, BSN.  She was notified by Cloyde Reams RN.         Prior Anticoagulation Instructions: INR 5.0  Skip today and tomorrow's dose of Coumadin then decrease dose to 1 tablet every day.  We will call you with instructions for your procedure.   2/16- Last dose of Coumadin 2/17- No Coumadin or Lovenox 2/18- Lovenox 80mg  in PM 2/19- Lovenox 80mg  in AM and PM 2/20- Lovenox 80mg  in AM only 2/21- Day of  Procedure.   Take 1 1/2 tablets of Coumadin x 2 days then resume 1 tablet daily.  Restart Lovenox 80mg  two times a day.   2/27- Recheck INR.    Current Anticoagulation Instructions: INR 1.8 Today and tomorrow take 1.5 pills then resume 1 pill everyday. Continue Lovenox injection every 12 hours unitl Wednesday night.

## 2010-12-18 ENCOUNTER — Encounter: Payer: Self-pay | Admitting: Infectious Disease

## 2010-12-21 ENCOUNTER — Encounter: Payer: Self-pay | Admitting: Cardiology

## 2010-12-21 ENCOUNTER — Encounter (INDEPENDENT_AMBULATORY_CARE_PROVIDER_SITE_OTHER): Payer: BC Managed Care – PPO

## 2010-12-21 DIAGNOSIS — I059 Rheumatic mitral valve disease, unspecified: Secondary | ICD-10-CM

## 2010-12-21 DIAGNOSIS — Z7901 Long term (current) use of anticoagulants: Secondary | ICD-10-CM

## 2010-12-21 DIAGNOSIS — I2789 Other specified pulmonary heart diseases: Secondary | ICD-10-CM

## 2010-12-21 LAB — CONVERTED CEMR LAB: POC INR: 1.4

## 2010-12-22 NOTE — Progress Notes (Signed)
Summary: pt need clearence-lvmtcb*  Phone Note From Other Clinic Call back at [phone]   Caller: Alcario Drought from Dr. Retta Mac office Request: Talk with Nurse, Talk with Provider Summary of Call: they need srgical clearence for pt to have surgery and they need it today because pt will be in the office on 3/13 and the MD is not back until then so they need it asap Initial call taken by: Omer Jack,  December 16, 2010 11:40 AM  Follow-up for Phone Call        LVMTCB* Whitney Maeola Sarah RN  December 16, 2010 1:19 PM  Dental Surgery is Tuesday. Clearance is needed. Office will fax information over.  She does not need to hold her Coumadin but they will need INR results faxed to their office. She has an appt. with the coumadin clinic Monday. She is s/p valve replacement and will prob. need antbx. prior to surgery. Will forward to Dr. [name] to review. Whitney Maeola Sarah RN  December 16, 2010 2:13 PM  Follow-up by: Whitney Maeola Sarah RN,  December 16, 2010 1:19 PM     Appended Document: pt need clearence OK for surgery if they will let her continue coumadin.  Needs endocarditis prophylaxis with 1 g Amoxicillin for prosthetic valve.   Appended Document: pt need clearence-lvmtcb* 2 grams amoxicillin  Appended Document: pt need clearence I talked with Dr Burnis Kingfisher for pt to continue coumadin--she is scheduled for INR 12/21/10 prior to dental procedure 12/21/10 -he states he will give pt IV ancef  prior to procedure so there was no need for oral Amoxicillin 2g prior to dental procedure

## 2010-12-22 NOTE — Letter (Signed)
Summary: [facility]  [facility]   Imported By: Marylou Mccoy 12/15/2010 08:28:38  _____________________________________________________________________  External Attachment:    Type:   Image     Comment:   External Document

## 2010-12-22 NOTE — Letter (Signed)
Summary: Katie [name]   Imported By: Marylou Mccoy 12/16/2010 15:16:54  _____________________________________________________________________  External Attachment:    Type:   Image     Comment:   External Document

## 2010-12-22 NOTE — Letter (Signed)
Summary: [facility] Patient Note   [facility] Patient Note   Imported By: Roderic Ovens 12/16/2010 10:41:32  _____________________________________________________________________  External Attachment:    Type:   Image     Comment:   External Document

## 2010-12-23 LAB — CULTURE, ROUTINE-ABSCESS

## 2010-12-24 LAB — POCT I-STAT 3, ART BLOOD GAS (G3+)
Acid-Base Excess: 5 mmol/L — ABNORMAL HIGH (ref 0.0–2.0)
Bicarbonate: 30.4 mEq/L — ABNORMAL HIGH (ref 20.0–24.0)
O2 Saturation: 89 %
TCO2: 32 mmol/L (ref 0–100)

## 2010-12-24 LAB — POCT I-STAT 3, VENOUS BLOOD GAS (G3P V)
Acid-Base Excess: 2 mmol/L (ref 0.0–2.0)
Bicarbonate: 28.2 mEq/L — ABNORMAL HIGH (ref 20.0–24.0)
O2 Saturation: 52 %
TCO2: 30 mmol/L (ref 0–100)
pCO2, Ven: 52 mmHg — ABNORMAL HIGH (ref 45.0–50.0)
pO2, Ven: 30 mmHg (ref 30.0–45.0)

## 2010-12-25 LAB — MAGNESIUM: Magnesium: 3.2 mg/dL — ABNORMAL HIGH (ref 1.5–2.5)

## 2010-12-25 LAB — BASIC METABOLIC PANEL
BUN: 11 mg/dL (ref 6–23)
BUN: 12 mg/dL (ref 6–23)
BUN: 12 mg/dL (ref 6–23)
BUN: 13 mg/dL (ref 6–23)
BUN: 15 mg/dL (ref 6–23)
BUN: 11 mg/dL (ref 6–23)
BUN: 9 mg/dL (ref 6–23)
BUN: 9 mg/dL (ref 6–23)
CO2: 28 mEq/L (ref 19–32)
CO2: 31 mEq/L (ref 19–32)
CO2: 31 mEq/L (ref 19–32)
CO2: 32 mEq/L (ref 19–32)
CO2: 33 mEq/L — ABNORMAL HIGH (ref 19–32)
CO2: 25 mEq/L (ref 19–32)
CO2: 28 mEq/L (ref 19–32)
CO2: 31 mEq/L (ref 19–32)
Calcium: 8.8 mg/dL (ref 8.4–10.5)
Calcium: 9.1 mg/dL (ref 8.4–10.5)
Calcium: 9.2 mg/dL (ref 8.4–10.5)
Calcium: 7.6 mg/dL — ABNORMAL LOW (ref 8.4–10.5)
Calcium: 8.5 mg/dL (ref 8.4–10.5)
Chloride: 100 mEq/L (ref 96–112)
Chloride: 101 mEq/L (ref 96–112)
Chloride: 101 mEq/L (ref 96–112)
Chloride: 102 mEq/L (ref 96–112)
Chloride: 103 mEq/L (ref 96–112)
Chloride: 108 mEq/L (ref 96–112)
Chloride: 99 mEq/L (ref 96–112)
Creatinine, Ser: 0.8 mg/dL (ref 0.4–1.2)
Creatinine, Ser: 0.82 mg/dL (ref 0.4–1.2)
Creatinine, Ser: 0.84 mg/dL (ref 0.4–1.2)
Creatinine, Ser: 0.98 mg/dL (ref 0.4–1.2)
Creatinine, Ser: 0.73 mg/dL (ref 0.4–1.2)
Creatinine, Ser: 0.77 mg/dL (ref 0.4–1.2)
GFR calc Af Amer: >60 mL/min (ref >60)
GFR calc Af Amer: >60 mL/min (ref >60)
GFR calc Af Amer: >60 mL/min (ref >60)
GFR calc Af Amer: >60 mL/min (ref >60)
GFR calc Af Amer: >60 mL/min (ref >60)
GFR calc Af Amer: >60 mL/min (ref >60)
GFR calc non Af Amer: >60 mL/min (ref >60)
GFR calc non Af Amer: >60 mL/min (ref >60)
GFR calc non Af Amer: >60 mL/min (ref >60)
GFR calc non Af Amer: >60 mL/min (ref >60)
GFR calc non Af Amer: >60 mL/min (ref >60)
GFR calc non Af Amer: >60 mL/min (ref >60)
Glucose, Bld: 111 mg/dL — ABNORMAL HIGH (ref 70–99)
Glucose, Bld: 83 mg/dL (ref 70–99)
Glucose, Bld: 84 mg/dL (ref 70–99)
Glucose, Bld: 88 mg/dL (ref 70–99)
Glucose, Bld: 93 mg/dL (ref 70–99)
Glucose, Bld: 99 mg/dL (ref 70–99)
Glucose, Bld: 113 mg/dL — ABNORMAL HIGH (ref 70–99)
Glucose, Bld: 141 mg/dL — ABNORMAL HIGH (ref 70–99)
Potassium: 3.9 mEq/L (ref 3.5–5.1)
Potassium: 4.6 mEq/L (ref 3.5–5.1)
Potassium: 4.6 mEq/L (ref 3.5–5.1)
Potassium: 5 mEq/L (ref 3.5–5.1)
Potassium: 4.3 mEq/L (ref 3.5–5.1)
Potassium: 4.4 mEq/L (ref 3.5–5.1)
Potassium: 4.5 mEq/L (ref 3.5–5.1)
Sodium: 139 mEq/L (ref 135–145)
Sodium: 140 mEq/L (ref 135–145)
Sodium: 140 mEq/L (ref 135–145)
Sodium: 138 mEq/L (ref 135–145)
Sodium: 138 mEq/L (ref 135–145)
Sodium: 139 mEq/L (ref 135–145)
Sodium: 139 mEq/L (ref 135–145)

## 2010-12-25 LAB — POCT I-STAT 3, ART BLOOD GAS (G3+)
Acid-Base Excess: 3 mmol/L — ABNORMAL HIGH (ref 0.0–2.0)
Acid-Base Excess: 4 mmol/L — ABNORMAL HIGH (ref 0.0–2.0)
Acid-base deficit: 2 mmol/L (ref 0.0–2.0)
Acid-base deficit: 5 mmol/L — ABNORMAL HIGH (ref 0.0–2.0)
Bicarbonate: 29.5 mEq/L — ABNORMAL HIGH (ref 20.0–24.0)
Bicarbonate: 29.7 mEq/L — ABNORMAL HIGH (ref 20.0–24.0)
Bicarbonate: 30.5 mEq/L — ABNORMAL HIGH (ref 20.0–24.0)
Bicarbonate: 23.4 mEq/L (ref 20.0–24.0)
Bicarbonate: 24.7 mEq/L — ABNORMAL HIGH (ref 20.0–24.0)
O2 Saturation: 100 %
O2 Saturation: 99 %
O2 Saturation: 100 %
O2 Saturation: 100 %
O2 Saturation: 99 %
Patient temperature: 97.8
Patient temperature: 33.6
Patient temperature: 35.6
Patient temperature: 35.9
TCO2: 31 mmol/L (ref 0–100)
TCO2: 31 mmol/L (ref 0–100)
TCO2: 24 mmol/L (ref 0–100)
TCO2: 26 mmol/L (ref 0–100)
pCO2 arterial: 46.5 mmHg — ABNORMAL HIGH (ref 35.0–45.0)
pCO2 arterial: 50.4 mmHg — ABNORMAL HIGH (ref 35.0–45.0)
pCO2 arterial: 53.8 mmHg — ABNORMAL HIGH (ref 35.0–45.0)
pCO2 arterial: 56.5 mmHg — ABNORMAL HIGH (ref 35.0–45.0)
pCO2 arterial: 33.6 mmHg — ABNORMAL LOW (ref 35.0–45.0)
pCO2 arterial: 45.3 mmHg — ABNORMAL HIGH (ref 35.0–45.0)
pCO2 arterial: 46.4 mmHg — ABNORMAL HIGH (ref 35.0–45.0)
pCO2 arterial: 46.9 mmHg — ABNORMAL HIGH (ref 35.0–45.0)
pH, Arterial: 7.322 — ABNORMAL LOW (ref 7.350–7.400)
pH, Arterial: 7.388 (ref 7.350–7.400)
pH, Arterial: 7.4 (ref 7.350–7.400)
pH, Arterial: 7.29 — ABNORMAL LOW (ref 7.350–7.400)
pH, Arterial: 7.293 — ABNORMAL LOW (ref 7.350–7.400)
pH, Arterial: 7.32 — ABNORMAL LOW (ref 7.350–7.400)
pO2, Arterial: 139 mmHg — ABNORMAL HIGH (ref 80.0–100.0)
pO2, Arterial: 188 mmHg — ABNORMAL HIGH (ref 80.0–100.0)
pO2, Arterial: 197 mmHg — ABNORMAL HIGH (ref 80.0–100.0)
pO2, Arterial: 198 mmHg — ABNORMAL HIGH (ref 80.0–100.0)
pO2, Arterial: 199 mmHg — ABNORMAL HIGH (ref 80.0–100.0)
pO2, Arterial: 220 mmHg — ABNORMAL HIGH (ref 80.0–100.0)
pO2, Arterial: 404 mmHg — ABNORMAL HIGH (ref 80.0–100.0)

## 2010-12-25 LAB — CBC
HCT: 22.3 % — ABNORMAL LOW (ref 36.0–46.0)
HCT: 25.4 % — ABNORMAL LOW (ref 36.0–46.0)
HCT: 25.7 % — ABNORMAL LOW (ref 36.0–46.0)
HCT: 26.6 % — ABNORMAL LOW (ref 36.0–46.0)
HCT: 27 % — ABNORMAL LOW (ref 36.0–46.0)
HCT: 27.7 % — ABNORMAL LOW (ref 36.0–46.0)
HCT: 28.1 % — ABNORMAL LOW (ref 36.0–46.0)
HCT: 28.9 % — ABNORMAL LOW (ref 36.0–46.0)
HCT: 27.9 % — ABNORMAL LOW (ref 36.0–46.0)
HCT: 29.3 % — ABNORMAL LOW (ref 36.0–46.0)
HCT: 33.2 % — ABNORMAL LOW (ref 36.0–46.0)
HCT: 34.1 % — ABNORMAL LOW (ref 36.0–46.0)
HCT: 35.8 % — ABNORMAL LOW (ref 36.0–46.0)
HCT: 36.5 % (ref 36.0–46.0)
Hemoglobin: 7.1 g/dL — ABNORMAL LOW (ref 12.0–15.0)
Hemoglobin: 8 g/dL — ABNORMAL LOW (ref 12.0–15.0)
Hemoglobin: 8.4 g/dL — ABNORMAL LOW (ref 12.0–15.0)
Hemoglobin: 8.6 g/dL — ABNORMAL LOW (ref 12.0–15.0)
Hemoglobin: 8.7 g/dL — ABNORMAL LOW (ref 12.0–15.0)
Hemoglobin: 9 g/dL — ABNORMAL LOW (ref 12.0–15.0)
Hemoglobin: 9 g/dL — ABNORMAL LOW (ref 12.0–15.0)
Hemoglobin: 10.8 g/dL — ABNORMAL LOW (ref 12.0–15.0)
Hemoglobin: 11.4 g/dL — ABNORMAL LOW (ref 12.0–15.0)
Hemoglobin: 11.8 g/dL — ABNORMAL LOW (ref 12.0–15.0)
Hemoglobin: 12.4 g/dL (ref 12.0–15.0)
Hemoglobin: 9.5 g/dL — ABNORMAL LOW (ref 12.0–15.0)
Hemoglobin: 9.6 g/dL — ABNORMAL LOW (ref 12.0–15.0)
MCH: 28.8 pg (ref 26.0–34.0)
MCH: 29.5 pg (ref 26.0–34.0)
MCH: 29.6 pg (ref 26.0–34.0)
MCH: 29.6 pg (ref 26.0–34.0)
MCH: 29.7 pg (ref 26.0–34.0)
MCH: 29.8 pg (ref 26.0–34.0)
MCH: 29.8 pg (ref 26.0–34.0)
MCH: 29.8 pg (ref 26.0–34.0)
MCH: 30.2 pg (ref 26.0–34.0)
MCH: 28.9 pg (ref 26.0–34.0)
MCH: 29.4 pg (ref 26.0–34.0)
MCH: 30.4 pg (ref 26.0–34.0)
MCH: 30.4 pg (ref 26.0–34.0)
MCHC: 31.1 g/dL (ref 30.0–36.0)
MCHC: 31.4 g/dL (ref 30.0–36.0)
MCHC: 31.5 g/dL (ref 30.0–36.0)
MCHC: 31.5 g/dL (ref 30.0–36.0)
MCHC: 31.6 g/dL (ref 30.0–36.0)
MCHC: 31.6 g/dL (ref 30.0–36.0)
MCHC: 31.8 g/dL (ref 30.0–36.0)
MCHC: 31.9 g/dL (ref 30.0–36.0)
MCHC: 31.9 g/dL (ref 30.0–36.0)
MCHC: 32 g/dL (ref 30.0–36.0)
MCHC: 32.5 g/dL (ref 30.0–36.0)
MCHC: 32.8 g/dL (ref 30.0–36.0)
MCHC: 33 g/dL (ref 30.0–36.0)
MCHC: 33.4 g/dL (ref 30.0–36.0)
MCV: 90.3 fL (ref 78.0–100.0)
MCV: 93.2 fL (ref 78.0–100.0)
MCV: 93.3 fL (ref 78.0–100.0)
MCV: 93.3 fL (ref 78.0–100.0)
MCV: 94.1 fL (ref 78.0–100.0)
MCV: 94.2 fL (ref 78.0–100.0)
MCV: 94.3 fL (ref 78.0–100.0)
MCV: 94.5 fL (ref 78.0–100.0)
MCV: 87.9 fL (ref 78.0–100.0)
MCV: 89.1 fL (ref 78.0–100.0)
MCV: 89.2 fL (ref 78.0–100.0)
MCV: 91.6 fL (ref 78.0–100.0)
Platelets: 227 10*3/uL (ref 150–400)
Platelets: 240 10*3/uL (ref 150–400)
Platelets: 242 10*3/uL (ref 150–400)
Platelets: 261 10*3/uL (ref 150–400)
Platelets: 273 10*3/uL (ref 150–400)
Platelets: 278 10*3/uL (ref 150–400)
Platelets: 300 10*3/uL (ref 150–400)
Platelets: 337 10*3/uL (ref 150–400)
Platelets: 115 10*3/uL — ABNORMAL LOW (ref 150–400)
Platelets: 116 10*3/uL — ABNORMAL LOW (ref 150–400)
Platelets: 125 10*3/uL — ABNORMAL LOW (ref 150–400)
Platelets: 127 10*3/uL — ABNORMAL LOW (ref 150–400)
Platelets: 99 10*3/uL — ABNORMAL LOW (ref 150–400)
RBC: 2.39 MIL/uL — ABNORMAL LOW (ref 3.87–5.11)
RBC: 2.7 MIL/uL — ABNORMAL LOW (ref 3.87–5.11)
RBC: 2.85 MIL/uL — ABNORMAL LOW (ref 3.87–5.11)
RBC: 2.94 MIL/uL — ABNORMAL LOW (ref 3.87–5.11)
RBC: 2.96 MIL/uL — ABNORMAL LOW (ref 3.87–5.11)
RBC: 2.98 MIL/uL — ABNORMAL LOW (ref 3.87–5.11)
RBC: 2.99 MIL/uL — ABNORMAL LOW (ref 3.87–5.11)
RBC: 3.13 MIL/uL — ABNORMAL LOW (ref 3.87–5.11)
RBC: 3.2 MIL/uL — ABNORMAL LOW (ref 3.87–5.11)
RBC: 3.88 MIL/uL (ref 3.87–5.11)
RBC: 4.01 MIL/uL (ref 3.87–5.11)
RBC: 4.21 MIL/uL (ref 3.87–5.11)
RDW: 16.2 % — ABNORMAL HIGH (ref 11.5–15.5)
RDW: 16.4 % — ABNORMAL HIGH (ref 11.5–15.5)
RDW: 16.5 % — ABNORMAL HIGH (ref 11.5–15.5)
RDW: 16.6 % — ABNORMAL HIGH (ref 11.5–15.5)
RDW: 16.8 % — ABNORMAL HIGH (ref 11.5–15.5)
RDW: 16.9 % — ABNORMAL HIGH (ref 11.5–15.5)
RDW: 16.9 % — ABNORMAL HIGH (ref 11.5–15.5)
RDW: 17.3 % — ABNORMAL HIGH (ref 11.5–15.5)
RDW: 18.2 % — ABNORMAL HIGH (ref 11.5–15.5)
RDW: 14.4 % (ref 11.5–15.5)
RDW: 16.3 % — ABNORMAL HIGH (ref 11.5–15.5)
RDW: 17 % — ABNORMAL HIGH (ref 11.5–15.5)
RDW: 17.2 % — ABNORMAL HIGH (ref 11.5–15.5)
RDW: 17.5 % — ABNORMAL HIGH (ref 11.5–15.5)
WBC: 10 10*3/uL (ref 4.0–10.5)
WBC: 10.3 10*3/uL (ref 4.0–10.5)
WBC: 7.4 10*3/uL (ref 4.0–10.5)
WBC: 7.8 10*3/uL (ref 4.0–10.5)
WBC: 8.2 10*3/uL (ref 4.0–10.5)
WBC: 8.9 10*3/uL (ref 4.0–10.5)
WBC: 9 10*3/uL (ref 4.0–10.5)
WBC: 9 10*3/uL (ref 4.0–10.5)
WBC: 10.9 10*3/uL — ABNORMAL HIGH (ref 4.0–10.5)
WBC: 11.8 10*3/uL — ABNORMAL HIGH (ref 4.0–10.5)
WBC: 15.8 10*3/uL — ABNORMAL HIGH (ref 4.0–10.5)
WBC: 5 10*3/uL (ref 4.0–10.5)
WBC: 6.2 10*3/uL (ref 4.0–10.5)
WBC: 7.1 10*3/uL (ref 4.0–10.5)

## 2010-12-25 LAB — HEMOGLOBIN AND HEMATOCRIT, BLOOD: HCT: 22.9 % — ABNORMAL LOW (ref 36.0–46.0)

## 2010-12-25 LAB — POCT I-STAT 3, VENOUS BLOOD GAS (G3P V)
Acid-base deficit: 1 mmol/L (ref 0.0–2.0)
TCO2: 25 mmol/L (ref 0–100)
pH, Ven: 7.395 — ABNORMAL HIGH (ref 7.250–7.300)

## 2010-12-25 LAB — COMPREHENSIVE METABOLIC PANEL
ALT: 12 U/L (ref 0–35)
ALT: 14 U/L (ref 0–35)
AST: 20 U/L (ref 0–37)
AST: 18 U/L (ref 0–37)
Albumin: 2.2 g/dL — ABNORMAL LOW (ref 3.5–5.2)
Albumin: 3.3 g/dL — ABNORMAL LOW (ref 3.5–5.2)
Alkaline Phosphatase: 59 U/L (ref 39–117)
Alkaline Phosphatase: 61 U/L (ref 39–117)
Alkaline Phosphatase: 63 U/L (ref 39–117)
BUN: 10 mg/dL (ref 6–23)
BUN: 10 mg/dL (ref 6–23)
CO2: 29 mEq/L (ref 19–32)
CO2: 30 mEq/L (ref 19–32)
CO2: 32 mEq/L (ref 19–32)
Calcium: 8.1 mg/dL — ABNORMAL LOW (ref 8.4–10.5)
Calcium: 8.1 mg/dL — ABNORMAL LOW (ref 8.4–10.5)
Calcium: 8.4 mg/dL (ref 8.4–10.5)
Chloride: 101 mEq/L (ref 96–112)
Chloride: 108 mEq/L (ref 96–112)
Creatinine, Ser: 0.7 mg/dL (ref 0.4–1.2)
Creatinine, Ser: 0.72 mg/dL (ref 0.4–1.2)
Creatinine, Ser: 0.8 mg/dL (ref 0.4–1.2)
GFR calc Af Amer: >60 mL/min (ref >60)
GFR calc Af Amer: >60 mL/min (ref >60)
GFR calc Af Amer: >60 mL/min (ref >60)
GFR calc non Af Amer: >60 mL/min (ref >60)
GFR calc non Af Amer: >60 mL/min (ref >60)
Glucose, Bld: 126 mg/dL — ABNORMAL HIGH (ref 70–99)
Glucose, Bld: 94 mg/dL (ref 70–99)
Potassium: 3.7 mEq/L (ref 3.5–5.1)
Potassium: 3.9 mEq/L (ref 3.5–5.1)
Potassium: 4.1 mEq/L (ref 3.5–5.1)
Sodium: 134 mEq/L — ABNORMAL LOW (ref 135–145)
Sodium: 139 mEq/L (ref 135–145)
Sodium: 140 mEq/L (ref 135–145)
Total Bilirubin: 0.6 mg/dL (ref 0.3–1.2)
Total Bilirubin: 0.8 mg/dL (ref 0.3–1.2)
Total Bilirubin: 0.5 mg/dL (ref 0.3–1.2)
Total Protein: 5.6 g/dL — ABNORMAL LOW (ref 6.0–8.3)
Total Protein: 6 g/dL (ref 6.0–8.3)
Total Protein: 6.1 g/dL (ref 6.0–8.3)
Total Protein: 6.5 g/dL (ref 6.0–8.3)

## 2010-12-25 LAB — POCT I-STAT 4, (NA,K, GLUC, HGB,HCT)
Glucose, Bld: 101 mg/dL — ABNORMAL HIGH (ref 70–99)
Glucose, Bld: 108 mg/dL — ABNORMAL HIGH (ref 70–99)
Glucose, Bld: 110 mg/dL — ABNORMAL HIGH (ref 70–99)
Glucose, Bld: 127 mg/dL — ABNORMAL HIGH (ref 70–99)
Glucose, Bld: 85 mg/dL (ref 70–99)
HCT: 22 % — ABNORMAL LOW (ref 36.0–46.0)
HCT: 23 % — ABNORMAL LOW (ref 36.0–46.0)
HCT: 24 % — ABNORMAL LOW (ref 36.0–46.0)
HCT: 25 % — ABNORMAL LOW (ref 36.0–46.0)
HCT: 28 % — ABNORMAL LOW (ref 36.0–46.0)
HCT: 34 % — ABNORMAL LOW (ref 36.0–46.0)
Hemoglobin: 10.2 g/dL — ABNORMAL LOW (ref 12.0–15.0)
Hemoglobin: 7.5 g/dL — ABNORMAL LOW (ref 12.0–15.0)
Hemoglobin: 8.5 g/dL — ABNORMAL LOW (ref 12.0–15.0)
Hemoglobin: 9.5 g/dL — ABNORMAL LOW (ref 12.0–15.0)
Potassium: 3.6 mEq/L (ref 3.5–5.1)
Potassium: 3.6 mEq/L (ref 3.5–5.1)
Potassium: 3.8 mEq/L (ref 3.5–5.1)
Potassium: 4.7 mEq/L (ref 3.5–5.1)
Sodium: 138 mEq/L (ref 135–145)
Sodium: 139 mEq/L (ref 135–145)
Sodium: 140 mEq/L (ref 135–145)
Sodium: 142 mEq/L (ref 135–145)

## 2010-12-25 LAB — PROTIME-INR
INR: 1.48 (ref 0.00–1.49)
INR: 1.49 (ref 0.00–1.49)
INR: 1.63 — ABNORMAL HIGH (ref 0.00–1.49)
INR: 1.74 — ABNORMAL HIGH (ref 0.00–1.49)
INR: 2.08 — ABNORMAL HIGH (ref 0.00–1.49)
INR: 2.69 — ABNORMAL HIGH (ref 0.00–1.49)
INR: 2.7 — ABNORMAL HIGH (ref 0.00–1.49)
INR: 1.02 (ref 0.00–1.49)
INR: 1.14 (ref 0.00–1.49)
INR: 1.14 (ref 0.00–1.49)
INR: 1.66 — ABNORMAL HIGH (ref 0.00–1.49)
Prothrombin Time: 18.2 seconds — ABNORMAL HIGH (ref 11.6–15.2)
Prothrombin Time: 19.5 seconds — ABNORMAL HIGH (ref 11.6–15.2)
Prothrombin Time: 20.5 seconds — ABNORMAL HIGH (ref 11.6–15.2)
Prothrombin Time: 23.5 seconds — ABNORMAL HIGH (ref 11.6–15.2)
Prothrombin Time: 28.7 seconds — ABNORMAL HIGH (ref 11.6–15.2)
Prothrombin Time: 28.8 seconds — ABNORMAL HIGH (ref 11.6–15.2)
Prothrombin Time: 13.3 seconds (ref 11.6–15.2)
Prothrombin Time: 14.3 seconds (ref 11.6–15.2)
Prothrombin Time: 14.8 seconds (ref 11.6–15.2)
Prothrombin Time: 19.8 seconds — ABNORMAL HIGH (ref 11.6–15.2)

## 2010-12-25 LAB — POCT I-STAT, CHEM 8
BUN: 10 mg/dL (ref 6–23)
Calcium, Ion: 1.03 mmol/L — ABNORMAL LOW (ref 1.12–1.32)
Creatinine, Ser: 0.7 mg/dL (ref 0.4–1.2)
HCT: 38 % (ref 36.0–46.0)
Hemoglobin: 12.2 g/dL (ref 12.0–15.0)
Hemoglobin: 12.9 g/dL (ref 12.0–15.0)
Potassium: 4.5 mEq/L (ref 3.5–5.1)
Sodium: 137 mEq/L (ref 135–145)
Sodium: 139 mEq/L (ref 135–145)
TCO2: 22 mmol/L (ref 0–100)

## 2010-12-25 LAB — URINALYSIS, ROUTINE W REFLEX MICROSCOPIC
Glucose, UA: NEGATIVE mg/dL
Ketones, ur: NEGATIVE mg/dL
Leukocytes, UA: NEGATIVE
Nitrite: NEGATIVE
Protein, ur: 30 mg/dL — AB
Urobilinogen, UA: 1 mg/dL (ref 0.0–1.0)

## 2010-12-25 LAB — GLUCOSE, CAPILLARY
Glucose-Capillary: 104 mg/dL — ABNORMAL HIGH (ref 70–99)
Glucose-Capillary: 108 mg/dL — ABNORMAL HIGH (ref 70–99)
Glucose-Capillary: 118 mg/dL — ABNORMAL HIGH (ref 70–99)
Glucose-Capillary: 136 mg/dL — ABNORMAL HIGH (ref 70–99)
Glucose-Capillary: 166 mg/dL — ABNORMAL HIGH (ref 70–99)
Glucose-Capillary: 63 mg/dL — ABNORMAL LOW (ref 70–99)
Glucose-Capillary: 71 mg/dL (ref 70–99)
Glucose-Capillary: 79 mg/dL (ref 70–99)
Glucose-Capillary: 97 mg/dL (ref 70–99)
Glucose-Capillary: 110 mg/dL — ABNORMAL HIGH (ref 70–99)
Glucose-Capillary: 114 mg/dL — ABNORMAL HIGH (ref 70–99)
Glucose-Capillary: 116 mg/dL — ABNORMAL HIGH (ref 70–99)
Glucose-Capillary: 117 mg/dL — ABNORMAL HIGH (ref 70–99)
Glucose-Capillary: 129 mg/dL — ABNORMAL HIGH (ref 70–99)
Glucose-Capillary: 130 mg/dL — ABNORMAL HIGH (ref 70–99)
Glucose-Capillary: 134 mg/dL — ABNORMAL HIGH (ref 70–99)
Glucose-Capillary: 139 mg/dL — ABNORMAL HIGH (ref 70–99)
Glucose-Capillary: 144 mg/dL — ABNORMAL HIGH (ref 70–99)
Glucose-Capillary: 58 mg/dL — ABNORMAL LOW (ref 70–99)
Glucose-Capillary: 65 mg/dL — ABNORMAL LOW (ref 70–99)
Glucose-Capillary: 72 mg/dL (ref 70–99)
Glucose-Capillary: 77 mg/dL (ref 70–99)
Glucose-Capillary: 81 mg/dL (ref 70–99)
Glucose-Capillary: 90 mg/dL (ref 70–99)
Glucose-Capillary: 90 mg/dL (ref 70–99)
Glucose-Capillary: 90 mg/dL (ref 70–99)
Glucose-Capillary: 92 mg/dL (ref 70–99)
Glucose-Capillary: 96 mg/dL (ref 70–99)
Glucose-Capillary: 98 mg/dL (ref 70–99)

## 2010-12-25 LAB — ANAEROBIC CULTURE

## 2010-12-25 LAB — CROSSMATCH
ABO/RH(D): AB POS
Antibody Screen: NEGATIVE
Antibody Screen: NEGATIVE

## 2010-12-25 LAB — HEPARIN LEVEL (UNFRACTIONATED)
Heparin Unfractionated: 0.22 IU/mL — ABNORMAL LOW (ref 0.30–0.70)
Heparin Unfractionated: 0.39 IU/mL (ref 0.30–0.70)
Heparin Unfractionated: 0.42 IU/mL (ref 0.30–0.70)
Heparin Unfractionated: 0.47 IU/mL (ref 0.30–0.70)
Heparin Unfractionated: 0.54 IU/mL (ref 0.30–0.70)
Heparin Unfractionated: 0.69 IU/mL (ref 0.30–0.70)
Heparin Unfractionated: 0.74 IU/mL — ABNORMAL HIGH (ref 0.30–0.70)

## 2010-12-25 LAB — TISSUE CULTURE

## 2010-12-25 LAB — PREPARE FRESH FROZEN PLASMA

## 2010-12-25 LAB — CULTURE, RESPIRATORY W GRAM STAIN

## 2010-12-25 LAB — TYPE AND SCREEN
ABO/RH(D): AB POS
Antibody Screen: NEGATIVE

## 2010-12-25 LAB — BRAIN NATRIURETIC PEPTIDE: Pro B Natriuretic peptide (BNP): 384 pg/mL — ABNORMAL HIGH (ref 0.0–100.0)

## 2010-12-25 LAB — BLOOD GAS, ARTERIAL
Drawn by: 181601
FIO2: 0.21 %
pCO2 arterial: 41.4 mmHg (ref 35.0–45.0)
pO2, Arterial: 89.1 mmHg (ref 80.0–100.0)

## 2010-12-25 LAB — PLATELET COUNT: Platelets: 78 10*3/uL — ABNORMAL LOW (ref 150–400)

## 2010-12-25 LAB — PREPARE PLATELETS

## 2010-12-25 LAB — APTT
aPTT: 26 seconds (ref 24–37)
aPTT: 37 seconds (ref 24–37)

## 2010-12-25 LAB — HEMOGLOBIN A1C: Mean Plasma Glucose: 111 mg/dL (ref <117)

## 2010-12-25 LAB — BODY FLUID CULTURE

## 2010-12-25 LAB — CREATININE, SERUM
GFR calc Af Amer: >60 mL/min (ref >60)
GFR calc non Af Amer: >60 mL/min (ref >60)

## 2010-12-25 LAB — MRSA PCR SCREENING: MRSA by PCR: NEGATIVE

## 2010-12-25 LAB — URINE MICROSCOPIC-ADD ON

## 2010-12-27 LAB — POCT I-STAT 3, ART BLOOD GAS (G3+)
Acid-Base Excess: 2 mmol/L (ref 0.0–2.0)
Bicarbonate: 26.6 mEq/L — ABNORMAL HIGH (ref 20.0–24.0)
TCO2: 28 mmol/L (ref 0–100)
pH, Arterial: 7.402 — ABNORMAL HIGH (ref 7.350–7.400)
pO2, Arterial: 86 mmHg (ref 80.0–100.0)

## 2010-12-27 LAB — POCT I-STAT 3, VENOUS BLOOD GAS (G3P V)
O2 Saturation: 69 %
TCO2: 29 mmol/L (ref 0–100)
pCO2, Ven: 53.2 mmHg — ABNORMAL HIGH (ref 45.0–50.0)
pO2, Ven: 39 mmHg (ref 30.0–45.0)

## 2010-12-28 LAB — URINE MICROSCOPIC-ADD ON

## 2010-12-28 LAB — PROTIME-INR
INR: 1.18 (ref 0.00–1.49)
Prothrombin Time: 14.9 seconds (ref 11.6–15.2)

## 2010-12-28 LAB — URINALYSIS, ROUTINE W REFLEX MICROSCOPIC
Bilirubin Urine: NEGATIVE
Glucose, UA: NEGATIVE mg/dL
Hgb urine dipstick: NEGATIVE
Ketones, ur: NEGATIVE mg/dL
Protein, ur: 30 mg/dL — AB
Urobilinogen, UA: 1 mg/dL (ref 0.0–1.0)

## 2010-12-28 LAB — COMPREHENSIVE METABOLIC PANEL
AST: 24 U/L (ref 0–37)
BUN: 12 mg/dL (ref 6–23)
CO2: 23 mEq/L (ref 19–32)
Calcium: 8.6 mg/dL (ref 8.4–10.5)
Chloride: 108 mEq/L (ref 96–112)
Creatinine, Ser: 0.88 mg/dL (ref 0.4–1.2)
GFR calc Af Amer: >60 mL/min (ref >60)
GFR calc non Af Amer: >60 mL/min (ref >60)
Glucose, Bld: 103 mg/dL — ABNORMAL HIGH (ref 70–99)
Total Bilirubin: 0.6 mg/dL (ref 0.3–1.2)

## 2010-12-28 LAB — BLOOD GAS, ARTERIAL
Acid-base deficit: 0 mmol/L (ref 0.0–2.0)
Drawn by: 181601
O2 Saturation: 97.7 %
TCO2: 25.3 mmol/L (ref 0–100)
pO2, Arterial: 99.2 mmHg (ref 80.0–100.0)

## 2010-12-28 LAB — CULTURE, ROUTINE-ABSCESS: Culture: NO GROWTH

## 2010-12-28 LAB — TYPE AND SCREEN
ABO/RH(D): AB POS
Antibody Screen: NEGATIVE

## 2010-12-28 LAB — CBC
HCT: 35.7 % — ABNORMAL LOW (ref 36.0–46.0)
MCHC: 34.8 g/dL (ref 30.0–36.0)
MCV: 91.6 fL (ref 78.0–100.0)
RBC: 3.9 MIL/uL (ref 3.87–5.11)

## 2010-12-28 LAB — HEMOGLOBIN A1C
Hgb A1c MFr Bld: 5.6 % (ref <5.7)
Mean Plasma Glucose: 114 mg/dL (ref <117)

## 2010-12-28 LAB — SURGICAL PCR SCREEN: Staphylococcus aureus: NEGATIVE

## 2010-12-29 NOTE — Medication Information (Signed)
Summary: rov/sp  Anticoagulant Therapy  Managed by: Windell Hummingbird, RN Referring MD: [name] MD, Dalton PCP: Dr. Laure Kidney MD: Antoine Poche MD, Fayrene Fearing Indication 1: Pulmonary Hypertension Indication 2: Mitral Valve Replacement Lab Used: [facility] [place] Site: [address] INR POC 1.4 INR RANGE 2.5-3.5  Dietary changes: no    Health status changes: no    Bleeding/hemorrhagic complications: no    Recent/future hospitalizations: no    Any changes in medication regimen? no    Recent/future dental: no  Any missed doses?: no       Is patient compliant with meds? yes       Allergies: 1)  ! Methotrexate  Anticoagulation Management History:      The patient is taking warfarin and comes in today for a routine follow up visit.  Negative risk factors for bleeding include an age less than 72 years old.  The bleeding index is 'low risk'.  Positive CHADS2 values include History of CHF.  Negative CHADS2 values include Age > 90 years old.  Her last INR was 15.2 ratio.  Anticoagulation responsible provider: Antoine Poche MD, Fayrene Fearing.  INR POC: 1.4.  Cuvette Lot#: [id number].  Exp: 12/2011.    Anticoagulation Management Assessment/Plan:      The patient's current anticoagulation dose is Warfarin sodium 5 mg tabs: 1 daily or as directed.  The target INR is 2.5-3.5.  The next INR is due 01/04/2011.  Anticoagulation instructions were given to patient.  Results were reviewed/authorized by Windell Hummingbird, RN.  She was notified by Windell Hummingbird, RN.         Prior Anticoagulation Instructions: INR 2.2 Take 1 1/2 tablets today and tomorrow.  Then resume taking 1 tablet every day. Recheck in 2 weeks.  Current Anticoagulation Instructions: INR 1.4 Take 1 1/2 tablets today and tomorrow.  Then begin taking 1 tablet every day, except take 1 1/2 tablets on Fridays. Recheck in 2 weeks.

## 2011-01-04 ENCOUNTER — Ambulatory Visit (INDEPENDENT_AMBULATORY_CARE_PROVIDER_SITE_OTHER): Payer: BC Managed Care – PPO | Admitting: *Deleted

## 2011-01-04 DIAGNOSIS — I059 Rheumatic mitral valve disease, unspecified: Secondary | ICD-10-CM

## 2011-01-04 DIAGNOSIS — Z7901 Long term (current) use of anticoagulants: Secondary | ICD-10-CM | POA: Insufficient documentation

## 2011-01-04 NOTE — Patient Instructions (Signed)
INR 1.5 Today take 2 pills then  Change dose to 1 pill everyday except 1.5 pill on Mondays, Wednesdays and Fridays.  Recheck in 9 days.

## 2011-01-07 NOTE — Letter (Signed)
Summary: [facility] Office Visit Note   [facility] Office Visit Note   Imported By: Roderic Ovens 12/30/2010 11:00:05  _____________________________________________________________________  External Attachment:    Type:   Image     Comment:   External Document

## 2011-01-13 ENCOUNTER — Ambulatory Visit (INDEPENDENT_AMBULATORY_CARE_PROVIDER_SITE_OTHER): Payer: BC Managed Care – PPO | Admitting: *Deleted

## 2011-01-13 DIAGNOSIS — Z7901 Long term (current) use of anticoagulants: Secondary | ICD-10-CM

## 2011-01-13 DIAGNOSIS — I059 Rheumatic mitral valve disease, unspecified: Secondary | ICD-10-CM

## 2011-01-13 LAB — POCT INR: INR: 1.9

## 2011-01-13 NOTE — Patient Instructions (Signed)
INR 1.9  Take 2 tablets today then increase dose to 1 1/2 tablets every day except 1 tablet on Sunday, Tuesday and Thursday.   Recheck INR in 2 weeks.

## 2011-01-15 LAB — POCT I-STAT 3, ART BLOOD GAS (G3+)
Acid-Base Excess: 3 mmol/L — ABNORMAL HIGH (ref 0.0–2.0)
Acid-Base Excess: 3 mmol/L — ABNORMAL HIGH (ref 0.0–2.0)
Bicarbonate: 28.8 mEq/L — ABNORMAL HIGH (ref 20.0–24.0)
O2 Saturation: 66 %
O2 Saturation: 95 %
TCO2: 30 mmol/L (ref 0–100)
pCO2 arterial: 49.3 mmHg — ABNORMAL HIGH (ref 35.0–45.0)
pCO2 arterial: 55.3 mmHg — ABNORMAL HIGH (ref 35.0–45.0)
pH, Arterial: 7.336 — ABNORMAL LOW (ref 7.350–7.400)
pO2, Arterial: 38 mmHg — CL (ref 80.0–100.0)
pO2, Arterial: 78 mmHg — ABNORMAL LOW (ref 80.0–100.0)

## 2011-01-15 LAB — POCT I-STAT 3, VENOUS BLOOD GAS (G3P V)
Acid-Base Excess: 2 mmol/L (ref 0.0–2.0)
Bicarbonate: 29.6 mEq/L — ABNORMAL HIGH (ref 20.0–24.0)
Bicarbonate: 30.6 mEq/L — ABNORMAL HIGH (ref 20.0–24.0)
TCO2: 31 mmol/L (ref 0–100)
TCO2: 32 mmol/L (ref 0–100)
pCO2, Ven: 58.4 mmHg — ABNORMAL HIGH (ref 45.0–50.0)
pH, Ven: 7.316 — ABNORMAL HIGH (ref 7.250–7.300)
pH, Ven: 7.327 — ABNORMAL HIGH (ref 7.250–7.300)
pO2, Ven: 39 mmHg (ref 30.0–45.0)

## 2011-01-18 LAB — CBC
HCT: 35 % — ABNORMAL LOW (ref 36.0–46.0)
HCT: 36.2 % (ref 36.0–46.0)
HCT: 36.5 % (ref 36.0–46.0)
HCT: 35.8 % — ABNORMAL LOW (ref 36.0–46.0)
Hemoglobin: 12.5 g/dL (ref 12.0–15.0)
Hemoglobin: 12.3 g/dL (ref 12.0–15.0)
MCHC: 33.9 g/dL (ref 30.0–36.0)
MCHC: 34.2 g/dL (ref 30.0–36.0)
MCHC: 34.2 g/dL (ref 30.0–36.0)
MCHC: 34.5 g/dL (ref 30.0–36.0)
MCHC: 34.5 g/dL (ref 30.0–36.0)
MCV: 94.4 fL (ref 78.0–100.0)
MCV: 94.6 fL (ref 78.0–100.0)
MCV: 94.5 fL (ref 78.0–100.0)
Platelets: 125 10*3/uL — ABNORMAL LOW (ref 150–400)
Platelets: 127 10*3/uL — ABNORMAL LOW (ref 150–400)
Platelets: 128 10*3/uL — ABNORMAL LOW (ref 150–400)
RBC: 3.18 MIL/uL — ABNORMAL LOW (ref 3.87–5.11)
RBC: 3.7 MIL/uL — ABNORMAL LOW (ref 3.87–5.11)
RBC: 3.79 MIL/uL — ABNORMAL LOW (ref 3.87–5.11)
RDW: 15.1 % (ref 11.5–15.5)
RDW: 15.1 % (ref 11.5–15.5)
RDW: 15.6 % — ABNORMAL HIGH (ref 11.5–15.5)
RDW: 15.9 % — ABNORMAL HIGH (ref 11.5–15.5)
WBC: 2.8 10*3/uL — ABNORMAL LOW (ref 4.0–10.5)
WBC: 8.5 10*3/uL (ref 4.0–10.5)

## 2011-01-18 LAB — COMPREHENSIVE METABOLIC PANEL
ALT: 9 U/L (ref 0–35)
ALT: 10 U/L (ref 0–35)
AST: 29 U/L (ref 0–37)
Albumin: 2.1 g/dL — ABNORMAL LOW (ref 3.5–5.2)
Albumin: 2.3 g/dL — ABNORMAL LOW (ref 3.5–5.2)
Alkaline Phosphatase: 65 U/L (ref 39–117)
BUN: 11 mg/dL (ref 6–23)
BUN: 9 mg/dL (ref 6–23)
CO2: 28 mEq/L (ref 19–32)
Calcium: 8.6 mg/dL (ref 8.4–10.5)
Calcium: 8.6 mg/dL (ref 8.4–10.5)
Calcium: 8.7 mg/dL (ref 8.4–10.5)
Chloride: 98 mEq/L (ref 96–112)
Creatinine, Ser: 0.76 mg/dL (ref 0.4–1.2)
Creatinine, Ser: 0.85 mg/dL (ref 0.4–1.2)
Creatinine, Ser: 0.82 mg/dL (ref 0.4–1.2)
GFR calc Af Amer: >60 mL/min (ref >60)
GFR calc non Af Amer: >60 mL/min (ref >60)
Glucose, Bld: 101 mg/dL — ABNORMAL HIGH (ref 70–99)
Glucose, Bld: 76 mg/dL (ref 70–99)
Glucose, Bld: 84 mg/dL (ref 70–99)
Potassium: 5.2 mEq/L — ABNORMAL HIGH (ref 3.5–5.1)
Potassium: 5.4 mEq/L — ABNORMAL HIGH (ref 3.5–5.1)
Sodium: 129 mEq/L — ABNORMAL LOW (ref 135–145)
Sodium: 136 mEq/L (ref 135–145)
Total Bilirubin: 0.7 mg/dL (ref 0.3–1.2)
Total Protein: 5.4 g/dL — ABNORMAL LOW (ref 6.0–8.3)
Total Protein: 5.5 g/dL — ABNORMAL LOW (ref 6.0–8.3)
Total Protein: 5.9 g/dL — ABNORMAL LOW (ref 6.0–8.3)

## 2011-01-18 LAB — DIC (DISSEMINATED INTRAVASCULAR COAGULATION)PANEL
D-Dimer, Quant: 2.38 ug/mL-FEU — ABNORMAL HIGH (ref 0.00–0.48)
Smear Review: NONE SEEN
aPTT: 29 seconds (ref 24–37)

## 2011-01-18 LAB — BASIC METABOLIC PANEL
CO2: 27 mEq/L (ref 19–32)
CO2: 29 mEq/L (ref 19–32)
Calcium: 8.4 mg/dL (ref 8.4–10.5)
Calcium: 8.4 mg/dL (ref 8.4–10.5)
Creatinine, Ser: 0.78 mg/dL (ref 0.4–1.2)
Creatinine, Ser: 0.99 mg/dL (ref 0.4–1.2)
GFR calc Af Amer: >60 mL/min (ref >60)
GFR calc Af Amer: >60 mL/min (ref >60)
GFR calc non Af Amer: >60 mL/min (ref >60)
GFR calc non Af Amer: >60 mL/min (ref >60)
Glucose, Bld: 107 mg/dL — ABNORMAL HIGH (ref 70–99)

## 2011-01-18 LAB — BRAIN NATRIURETIC PEPTIDE
Pro B Natriuretic peptide (BNP): 1034 pg/mL — ABNORMAL HIGH (ref 0.0–100.0)
Pro B Natriuretic peptide (BNP): 1117 pg/mL — ABNORMAL HIGH (ref 0.0–100.0)

## 2011-01-18 LAB — URINALYSIS, MICROSCOPIC ONLY
Glucose, UA: NEGATIVE mg/dL
Leukocytes, UA: NEGATIVE
Nitrite: POSITIVE — AB
Specific Gravity, Urine: 1.025 (ref 1.005–1.030)
Specific Gravity, Urine: >1.03 — ABNORMAL HIGH (ref 1.005–1.030)
Urobilinogen, UA: 1 mg/dL (ref 0.0–1.0)
WBC, UA: NONE SEEN WBC/hpf (ref <3)
pH: 5 (ref 5.0–8.0)
pH: 5.5 (ref 5.0–8.0)

## 2011-01-18 LAB — URINALYSIS, ROUTINE W REFLEX MICROSCOPIC
Ketones, ur: NEGATIVE mg/dL
Leukocytes, UA: NEGATIVE
Nitrite: NEGATIVE
Protein, ur: 100 mg/dL — AB
Urobilinogen, UA: 2 mg/dL — ABNORMAL HIGH (ref 0.0–1.0)

## 2011-01-18 LAB — BLOOD GAS, ARTERIAL
Acid-base deficit: 3.7 mmol/L — ABNORMAL HIGH (ref 0.0–2.0)
Bicarbonate: 22.6 mEq/L (ref 20.0–24.0)
Delivery systems: 5
O2 Saturation: 100 %

## 2011-01-18 LAB — URINALYSIS, DIPSTICK ONLY
Glucose, UA: NEGATIVE mg/dL
Leukocytes, UA: NEGATIVE
Specific Gravity, Urine: 1.015 (ref 1.005–1.030)
Urobilinogen, UA: 2 mg/dL — ABNORMAL HIGH (ref 0.0–1.0)

## 2011-01-18 LAB — URINE CULTURE: Culture: NO GROWTH

## 2011-01-18 LAB — STREP B DNA PROBE

## 2011-01-18 LAB — DIFFERENTIAL
Basophils Relative: 2 % — ABNORMAL HIGH (ref 0–1)
Eosinophils Absolute: 0 10*3/uL (ref 0.0–0.7)
Lymphs Abs: 0.6 10*3/uL — ABNORMAL LOW (ref 0.7–4.0)
Lymphs Abs: 0.6 10*3/uL — ABNORMAL LOW (ref 0.7–4.0)
Monocytes Relative: 14 % — ABNORMAL HIGH (ref 3–12)
Neutro Abs: 1.6 10*3/uL — ABNORMAL LOW (ref 1.7–7.7)
Neutrophils Relative %: 60 % (ref 43–77)
Neutrophils Relative %: 61 % (ref 43–77)

## 2011-01-18 LAB — GC/CHLAMYDIA PROBE AMP, GENITAL
Chlamydia, DNA Probe: NEGATIVE
GC Probe Amp, Genital: NEGATIVE

## 2011-01-18 LAB — URIC ACID: Uric Acid, Serum: 8.2 mg/dL — ABNORMAL HIGH (ref 2.4–7.0)

## 2011-01-18 LAB — CREATININE, URINE, RANDOM
Creatinine, Urine: 211.8 mg/dL
Creatinine, Urine: 89.7 mg/dL

## 2011-01-18 LAB — SEDIMENTATION RATE: Sed Rate: 29 mm/hr — ABNORMAL HIGH (ref 0–22)

## 2011-01-18 LAB — ABO/RH: ABO/RH(D): AB POS

## 2011-01-18 LAB — TYPE AND SCREEN: ABO/RH(D): AB POS

## 2011-01-18 LAB — SODIUM, URINE, RANDOM: Sodium, Ur: 16 mEq/L

## 2011-01-27 ENCOUNTER — Ambulatory Visit (INDEPENDENT_AMBULATORY_CARE_PROVIDER_SITE_OTHER): Payer: BC Managed Care – PPO | Admitting: *Deleted

## 2011-01-27 DIAGNOSIS — I059 Rheumatic mitral valve disease, unspecified: Secondary | ICD-10-CM

## 2011-01-27 LAB — POCT INR: INR: 2.4

## 2011-02-08 ENCOUNTER — Ambulatory Visit (INDEPENDENT_AMBULATORY_CARE_PROVIDER_SITE_OTHER): Payer: BC Managed Care – PPO | Admitting: *Deleted

## 2011-02-08 DIAGNOSIS — I059 Rheumatic mitral valve disease, unspecified: Secondary | ICD-10-CM

## 2011-02-08 LAB — POCT INR: INR: 2.3

## 2011-02-10 ENCOUNTER — Encounter: Payer: BC Managed Care – PPO | Admitting: *Deleted

## 2011-02-22 ENCOUNTER — Ambulatory Visit (INDEPENDENT_AMBULATORY_CARE_PROVIDER_SITE_OTHER): Payer: BC Managed Care – PPO | Admitting: *Deleted

## 2011-02-22 DIAGNOSIS — I059 Rheumatic mitral valve disease, unspecified: Secondary | ICD-10-CM

## 2011-02-23 NOTE — Assessment & Plan Note (Signed)
OFFICE VISIT   ZAMAYA, RAPAPORT  DOB:  Sep 30, 1966                                        August 24, 2010  CHART #:  [id number]   HISTORY:  Katie [name] returns to the office today for further followup of  cellulitis of her chest.  She was last seen here in the office on  November 1.  Since then, she reports feeling much better.  She still has  some mild soreness across her chest, but overall she says it is at least  80% better than it was.  She is not having any fevers or chills.  She  has actually restarted cardiac rehab program.  She has no shortness of  breath neither with activity nor at rest.  She otherwise feels well and  she is encouraged him hoping to be able to go back to work in the  reasonably near future.  She remains on oral ciprofloxacin.  She has not  had any further problems or complaints.  The remainder of her review of  systems is unremarkable.   PHYSICAL EXAMINATION:  General:  A well-appearing female.  Vital Signs:  Blood pressure 106/76, pulse 90 and regular, oxygen saturation 91%.  Chest:  Well-healed surgical incision in the right anterolateral chest  wall.  There is mild swelling and very mild tenderness over the anterior  sternum, which is considerably improved than it was 3 weeks ago.  There  was no fluctuance on palpation.  Auscultation reveals clear breath  sounds that are symmetrical bilaterally.  There is no obvious sign of  rash or cellulitis on the scan.  Cardiovascular:  Regular rate and  rhythm with mechanical heart sounds.  No other abnormalities are noted.   IMPRESSION:  The patient is doing much better from a clinical standpoint  with respect to her cellulitis, mastitis, and possible sternal  osteomyelitis and/or costochondritis.   PLAN:  We will obtain a follow-up CT scan to make sure that her deep  soft tissue infection has continued to resolve without any significant  complications.  From a clinical standpoint, she  seems to be doing well.  Presuming that her CT scan looks good.  We will  plan to see her back in 3 weeks.   Salvatore Decent. [name], M.D.  Electronically Signed   CHO/MEDQ  D:  08/24/2010  T:  08/25/2010  Job:  [id number]   cc:   Marca Ancona, MD  Lurena Nida, MD

## 2011-02-23 NOTE — Assessment & Plan Note (Signed)
OFFICE VISIT   Katie [name], Katie [name]  DOB:  [birth year]                                        August 03, 2010  CHART #:  [id number]   HISTORY OF PRESENT ILLNESS:  The patient presents to the office today is  an unscheduled visit due to increased anterior chest pain.  She was seen  at the [facility]iology office today for her prothrombin time check  and was complaining of increased pain and tenderness over her anterior  chest.  She was instructed to come over to our office.  She states that  the pain has been coming on gradually over the last 2 weeks and has  gotten to the point where she is now exclusively tender across her  anterior chest.  She has not had any fevers.  It hurts to take a deep  breath.  She has not had any shortness of breath.  The pain is not  directly related to her mini thoracotomy incision, nor is it related to  her subxiphoid pericardial window incision.  It seems to be located  directly over the sternomanubrial junction.  She denies any history of  trauma.  She reports that her Coumadin dosing has been under good  control and her INR was reportedly 3.1 today.  She has not had any tachy  palpitations.  The remainder of her review of systems is unremarkable.   PHYSICAL EXAMINATION:  Notable for a well-appearing female who is  obviously in some distress due to pain.  Blood pressure is 104/70 and  pulse 89 and regular.  Examination of the chest reveals exquisite  tenderness on palpation over the sternum near the sternomanubrial  junction.  There is no sign of motion to suggest a fracture, but the  patient is exquisitely tender in this location.  There does seem to be  some swelling and puffiness in this area, although no fluctuance is  noted.  This is distinctly separate from an unrelated to her mini  thoracotomy incision which is much more lateral and does not bother her.  There is only minimal tenderness on palpation over that incision.   There  is no sign of a hernia.  Inferiorly, her subxiphoid pericardial window  incision is also healing nicely and there is no tenderness on palpation  in this region to suggest an epigastric hernia.  Auscultation of the  chest reveals clear breath sounds that are symmetrical bilaterally.  Cardiovascular exam is notable for regular rate and rhythm with  mechanical prosthetic valve heart sounds.  No murmurs, rubs, or gallops  are noted.  The abdomen is soft and nontender.  The extremities are warm  and well perfused.   IMPRESSION:  Severe anterior chest wall pain, this is very puzzling and  the pain, tenderness, and swelling appears to be located directly  anterior to the sternomanubrial junction.  There is no history of any  trauma.  There is no obvious sign of infection and the patient does not  have any cough or fever, although the possibility of underlying  cellulitis or soft tissue infection cannot be definitively ruled out.   PLAN:  We will send the patient for a chest CT scan with contrast to  sort this out further to be done today.  We will see her back as soon as  this scan has  been completed.   Salvatore Decent. [name], M.D.  Electronically Signed   CHO/MEDQ  D:  08/03/2010  T:  08/04/2010  Job:  [id number]   cc:   Marca Ancona, MD  Lurena Nida, MD

## 2011-02-23 NOTE — Op Note (Signed)
Katie [name], Katie [name]               ACCOUNT NO.:  [id number]   MEDICAL RECORD NO.:  [id number]          PATIENT TYPE:  INP   LOCATION:  9372                          FACILITY:  [facility]   PHYSICIAN:  Janine Limbo, M.D.DATE OF BIRTH:  1965/12/02   DATE OF PROCEDURE:  03/29/2009  DATE OF DISCHARGE:                               OPERATIVE REPORT   PREOPERATIVE DIAGNOSES:  1. 34-week and 2-day gestation.  2. Premature rupture of membranes.  3. Lupus.  4. Pulmonary fibrosis  5. Polymyositis.  6. Fibroid uterus.  7. Failure to progress in labor.  8. Desires sterilization.   POSTOPERATIVE DIAGNOSES:  1. 34-week and 2-day gestation.  2. Premature rupture of membranes.  3. Lupus.  4. Pulmonary fibrosis  5. Polymyositis.  6. Fibroid uterus.  7. Failure to progress in labor.  8. Desires sterilization.   PROCEDURES:  1. Primary low transverse cesarean section.  2. Bilateral tubal ligation.   SURGEON:  Janine Limbo, MD   FIRST ASSISTANT:  Rhona Leavens, CNM   ANESTHETIC:  Epidural.   DISPOSITION:  Ms. Heatwole is a 45 year old female, gravida 3, para 0-1-1-  0, who presented to the [facility] [place] on March 28, 2009  at 34 weeks and 1-day gestation.  She had spontaneous rupture of  membranes.  The patient's pregnancy has been complicated by lupus  erythematosus with pulmonary fibrosis.  She also has polymyositis.  The  patient has been followed at the [facility] and  Gynecology Division of [facility].  She has been  followed carefully by her pulmonary medicine physician.  She has had  consults from the Anesthesia Department, [facility] [place].  The patient was started on Pitocin, but did not labor.  Her Pitocin was  restarted on the morning of March 29, 2009.  Again, there was little to  no progress during her labor.  The patient suffers from a chronic cough  from her pulmonary fibrosis.  Her baby had a reassuring  tracing.  On the  evening of March 29, 2009, we discussed our treatment options which  included continued Pitocin augmentation, attempt to place a Foley  catheter in the cervix, Cervidil placement for ripening of the cervix,  resting overnight with continued augmentation on March 30, 2009, and  cesarean section.  The risk and benefits of all of those options were  reviewed.  The patient elected to proceed with cesarean delivery at this  time.  We reviewed the risk associated with cesarean section and tubal  ligation which included, but are not limited to, anesthetic  complications, bleeding, infections, possible damage to surrounding  organs, and possible tubal failure (17 per 1000).  We also reviewed the  postoperative complications associated with cesarean delivery.  The  patient reports that she accepted all of those risks and did not want to  proceed with other measures, but was ready to proceed directly with  cesarean section.   FINDINGS:  A 4 pounds 6.5 ounces female infant Marcial Pacas) was delivered  from a cephalic presentation.  The Apgar scores were 7  at 1 and 9 at 5  minutes.  The arterial cord blood pH was 7.25.  The fallopian tubes and  ovaries appeared normal.  There was a 4-cm fibroid present on the  posterior left lower uterine segment.   DESCRIPTION OF PROCEDURE:  The patient was taken to the operating room,  where it was determined that the epidural that had been placed earlier  in the day would not be adequate for cesarean delivery.  A repeat  epidural catheter was placed, which functioned well.  The patient's  abdomen, perineum, and vagina were then prepped with multiple layers of  Betadine.  A Foley catheter had previously been placed.  The patient  was noted to have blood-tinged urine prior to her surgery.  The patient  was sterilely draped.  The lower abdomen was injected with 10 mL of 0.5%  Marcaine with epinephrine.  A low-transverse incision was made in the  abdomen  and carried sharply through the subcutaneous tissue, fascia, and  the anterior peritoneum.  An incision was made in the lower uterine  segment and extended in a low-transverse fashion.  The fetal head was  delivered without difficulty.  The mouth and nose were suctioned.  The  remainder of the infant was delivered.  The cord was clamped and cut and  the infant was handed to the waiting pediatric team.  Routine cord blood  studies were obtained.  The placenta was removed and sent to Pathology.  The uterine cavity was cleaned of amniotic fluid, clotted blood, and  membranes.  The uterine incision was closed using a running locking  suture of 2-0 Vicryl followed by imbricating suture of 2-0 Vicryl.  Hemostasis was adequate.  The left fallopian tube was identified and  followed to its fimbriated end.  A knuckle of tube was made on the left  using 2 free ties of 0 plain catgut.  The knuckle of tube thus made was  excised.  Hemostasis was adequate.  An identical procedure was carried  out on the opposite side.  Again, hemostasis was adequate.  The pelvis  was vigorously irrigated.  The anterior peritoneum was closed using a  running suture of 3-0 Vicryl.  The fascia was closed using a running  suture of 0 Vicryl followed by three interrupted sutures of 0 Vicryl.  A  Jackson-Pratt drain was placed in the subcutaneous layer and brought out  through the left lower quadrant.  It was sutured into place using 2-0  silk.  The subcutaneous layer was closed using a running suture of 0  Vicryl.  The skin was reapproximated using a subcuticular suture of 3-0  Monocryl.  Sponge, needle, and instrument counts were correct on 2  occasions.  Estimated blood loss for the procedure was 700 mL.  The  patient tolerated her procedure well.  She received 900 mL of IV fluids.  She had 100 mL of urine output during her procedure.  The portions of  the right fallopian tube, left fallopian tubes, and the placenta were   sent to Pathology for evaluation.  The infant was taken to the Neonatal  Intensive Care Unit for observation, but was doing very well.      Janine Limbo, M.D.  Electronically Signed     AVS/MEDQ  D:  03/29/2009  T:  03/30/2009  Job:  [id number]

## 2011-02-23 NOTE — Assessment & Plan Note (Signed)
OFFICE VISIT   AYANAH, SNADER  DOB:  October 20, 1965                                        Mar 02, 2010  CHART #:  [id number]   The patient returns for follow up today of mitral stenosis and mitral  regurgitation.  She was last seen here in the office on Feb 23, 2010, at  which time we started to make tentative plans to proceed with surgery on  March 17, 2010.  Since then, she underwent CT angiogram of the chest,  abdomen, and pelvis earlier today.  The radiologists have not yet had an  opportunity to review the scan and provide an official report.  However,  on my review there does not appear to be any significant aortoiliac  occlusive disease which might preclude femoral artery cannulation for  surgery.  I have again reviewed the indications, risks, and potential  benefits of surgery with the patient here in the office today.  She  understands that it will be unlikely that her mitral valve could be  repairable and I suspect that we would need to replace her valve.  Assuming this is the case, she specifically requests that we replace her  valve using a mechanical prosthesis.  This will come with the attendant  need for long-term anticoagulation with Coumadin.  We will also take a  look at her tricuspid valve, and if her tricuspid annulus is  significantly dilated and/or there is significant tricuspid  regurgitation, we would plan ring annuloplasty at that time.  I have  instructed the patient to stop taking Coumadin 1 week prior to surgery.  She will also stop taking her Imuran at that time.  She remains on  prednisone and we will need to give her some stress doses of steroids  around her time of surgery.  I have instructed her to continue the rest  of her medications without changes.  All of her questions have been  addressed.  She understands and accepts all potential associated risks  including but not limited to risk of death, stroke, myocardial  infarction, congestive heart failure, respiratory failure, renal  failure, bleeding requiring blood transfusion, heart block with  bradycardia requiring permanent pacemaker, possible need to  convert to a full median sternotomy or enlarged thoracotomy.  All of her  questions have been addressed.   Salvatore Decent. [name], M.D.  Electronically Signed   CHO/MEDQ  D:  03/02/2010  T:  03/03/2010  Job:  [id number]   cc:   Marca Ancona, MD  Terrial Rhodes, M.D.  Charlaine Dalton. Sherene Sires, MD, [facility]  Lurena Nida, MD

## 2011-02-23 NOTE — Assessment & Plan Note (Signed)
OFFICE VISIT   Katie, [name]  DOB:  07-Jul-1966                                        July 20, 2010  CHART #:  [id number]   HISTORY OF PRESENT ILLNESS:  The patient returns for followup status  post right mini thoracotomy for mitral valve replacement on May 13, 2010.  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  She was  most recently seen here in the office on June 22, 2010.  Since  then, she underwent repeat right heart catheterization by Dr. [name] on  July 14, 2010, for followup of her severe pulmonary hypertension.  Her  PA pressures were noticeably decreased down to 42/20 with pulmonary  capillary wedge pressure of 8.  Pulmonary vascular resistance was 4.8  Wood units.  This was noticeably improved in comparison with prior to  surgery, but she was restarted on Revatio by Dr. [name] because of her  mild-to-moderate residual pulmonary hypertension.  The patient reports  that overall she is doing well, although she continues to have problems  with pain across her chest.  This seems to be exacerbated by movement  and in particular she has trouble with the fact that she has very large  breasts.  She notes that with movement of her breasts the pain seems to  be exacerbated.  Unfortunately, this has limited her ability to go back  to work.  She has not had any shortness of breath.  She has not had any  tachy palpitations, but she notes that her pulse does seem to be running  faster than normal and it takes a while to recover when she gets tired  or winded.  The remainder of her review of systems is unremarkable.  She  continues to do well with Coumadin therapy and has not had any  significant problems.   PHYSICAL EXAMINATION:  Notable for a well-appearing female with blood  pressure 117/78 and pulse is  103 and regular.  Examination of the chest  reveals a mini thoracotomy incision that is healing nicely.  Auscultation reveals clear breath sounds that are symmetrical  bilaterally.  No wheezes, rales, or rhonchi are noted.  Cardiovascular  exam is notable for prosthetic valve heart sounds with regular rhythm.  No murmurs, rubs, or gallops are noted.  The abdomen is soft and  nontender.  The extremities are warm and well perfused.  There is no  lower extremity edema.   DIAGNOSTIC TESTS:  Chest x-ray performed today at the [facility] is reviewed.  This demonstrates clear lung fields bilaterally.  There are no pleural effusion.  No other significant abnormalities are  noted.  The radiologist is commented upon the blurry appearance of the  mitral valve prosthesis that presumably is related to normal cardiac  activity, particularly given the patient's relatively fast pulse.   IMPRESSION:  The patient seems to be doing well except the fact that she  continues to have significant pain across her chest that seems to be  musculoskeletal in origin and related to her 2 surgical procedures.  The  pain is undoubtedly made worse by the fact that she has very large  breasts and with any motion or change in body position, the  pain seems  to be exacerbated.  Overall, things seem to be healing fine and her  chest x-ray looks good.   PLAN:  I have reassured the patient that there does not seem to be  anything in particular wrong and that unfortunately may take some time  for the soreness in her chest to continue to improve.  This may mean  that she will need to be out of work for at least another month or so.  We will have the patient return in 3 months.  If she finds that she has  not improved enough to return to work within the next month, she will  call and come back for further followup.  All of her questions have been  addressed.   Salvatore Decent. [name], M.D.  Electronically Signed    CHO/MEDQ  D:  07/20/2010  T:  07/21/2010  Job:  [id number]   cc:   Marca Ancona, MD  Terrial Rhodes, M.D.  Lurena Nida, MD

## 2011-02-23 NOTE — Assessment & Plan Note (Signed)
OFFICE VISIT   Katie [name], Katie [name]  DOB:  1966-06-15                                        June 22, 2010  CHART #:  [id number]   HISTORY OF PRESENT ILLNESS:  The patient returns to the office today for  further follow up of right mini thoracotomy for mitral valve replacement  and subsequent hospital readmission for subxiphoid pericardial window  for pericardial effusion with early signs of pericardial tamponade.  She  was last seen here in the office on June 12, 2010.  Since then, she  has continued to improve.  Pain in her right hip and thigh related to  her retroperitoneal hematoma has continued to improve, and she is now  ambulating much better without a walker.  We have obtained a blood count  today, but the results of that remain pending at this time to make sure  that her hemoglobin remains stable.  She has not had any shortness of  breath.  She has only mild residual soreness in her right breast and in  her epigastric region related to her right mini thoracotomy and  subxiphoid pericardial window.  These pains bother her primarily at  night and for the most part she is not using any pain relievers other  than at night to help her sleep.  She has not had any shortness of  breath.  She still has intermittent nausea which is a chronic problem.  She is getting along fairly well overall and she is starting to  participate in the cardiac rehab program.  She continues to have her  Coumadin dose monitored and adjusted through the [facility].  The remainder of her review of systems unremarkable.   PHYSICAL EXAMINATION:  Notable for well-appearing female with blood  pressure 110/77, pulse 98 and regular, oxygen saturation 96% on room  air.  Examination of the chest reveals that her mini thoracotomy  incision is healing nicely.  The subxiphoid pericardial window incision  is also healing well and the drainage has stopped where this had  been  draining last week.  The chest tube incision from her pericardial window  and one of the two chest tube incisions from her mini thoracotomy have  not healed over completely, but they are clean and appear to be  granulating appropriately.  Auscultation of the chest reveals clear  breath sounds that are symmetrical bilaterally.  No wheezes, rales, or  rhonchi are noted.  Cardiovascular exam demonstrates regular rate and  rhythm with obvious mechanical heart sounds.  No murmurs, rubs, or  gallops are noted.  The extremities are warm and well perfused.  There  is no lower extremity edema.  The remainder of her physical exam is  unremarkable.   IMPRESSION:  The patient seems to be doing well.  Her surgical incisions  are healing nicely.  She is making slow but steady progress.   PLAN:  We will check her hemoglobin which was drawn earlier this  afternoon to make sure that it remains stable.  We will plan to see her  back in 4 weeks' time with a chest x-ray.  I have encouraged her to  continue to gradually increase her physical activity as tolerated.  She  should continue to remain careful with respect to flexion and extension  of the right hip.  I do think it is probably reasonable for her to  resume driving an automobile, but I have suggested that she should start  driving just short distances during the daylight for the time being.  All of her questions have been addressed.   Salvatore Decent. [name], M.D.  Electronically Signed   CHO/MEDQ  D:  06/22/2010  T:  06/23/2010  Job:  [id number]   cc:   Charlaine Dalton. Sherene Sires, MD, [facility]  Marca Ancona, MD  Terrial Rhodes, M.D.  Lurena Nida, MD

## 2011-02-23 NOTE — Assessment & Plan Note (Signed)
OFFICE VISIT   Katie [name], Katie [name]  DOB:  1966/03/17                                        August 03, 2010  CHART #:  [id number]   ADDENDUM   The patient underwent CT scan of the chest at [facility]  today.  This reveals some diffuse inflammation along the right anterior  chest wall extending to the sternum which could represent osteomyelitis.  There was no obvious abscess, although there was a small subcutaneous  collection in the right breast.  There was no significant pleural  effusion on the right and there is no pericardial effusion.  The aorta  and all the other great vessels all looked normal.  She does have some  atelectasis and consolidation of the distal portion of the right middle  lobe and the inflammation extends all anterior to that.  Otherwise there  is nothing to suggest the presence of empyema or other process within  the chest cavity.   IMPRESSION:  Soft tissue stranding and inflammation on the chest CT scan  of unclear significance.  The patient certainly could have some  cellulitis and/or costochondritis, and the inflammation extends to the  midline to the sternum.  The possibility of sternal osteomyelitis cannot  be ruled out.  Given her chronic immunosuppressed state, I suspect that  she should be treated empirically with antibiotics.  Whether or not  needle aspiration could provide definitive culture data, it is difficult  to say.   PLAN:  We will consult with the vascular interventional radiologist to  see whether or not needle aspiration of the fluid in the soft tissues of  the breast or chest wall could be aspirated for possible culture.  We  will obtain some blood cultures and a CBC.  We will then start her  empirically on  oral Keflex.  If she does not improve, we will need to admit her for  intravenous antibiotics and further treatment.   Salvatore Decent. [name], M.D.  Electronically Signed   CHO/MEDQ  D:   08/03/2010  T:  08/04/2010  Job:  [id number]   cc:   Lurena Nida, MD  Marca Ancona, MD

## 2011-02-23 NOTE — Assessment & Plan Note (Signed)
OFFICE VISIT   MELLISA, ARSHAD  DOB:  1966/07/10                                        March 16, 2010  CHART #:  [id number]   HISTORY OF PRESENT ILLNESS:  Ms. Mihok returns for a followup of mitral  stenosis, mitral regurgitation and tricuspid regurgitation.  She was  last seen here in the office on Mar 02, 2010 and she is on the schedule  for surgery tomorrow.  However, over the last 2 weeks she has had  progressive swelling of the middle finger on her left hand in the  proximal phalanx.  This continued to enlarge and became red and tender  ultimately prompting her to present to urgent care this past weekend.  She was evaluated by Dr. Donetta Potts.  The proximal phalanx of her middle  finger on the left hand was drained and by report she was noted to have  an abscess with purulent material aspirated.  Preliminary culture  results have not grown any bacteria.  Plain film x-rays obtained at that  time did not reveal any signs suggestive of osteomyelitis, although  there was significant soft tissue swelling with some sclerosis of the  proximal phalanx of the left third digit.  Ms. Navejas returns to our  office for further followup.  Of note, routine blood work obtained on  June 3 was notable for a white blood count of 3,800.  Ms. Befort reports  that she has had problems with that finger in the past and had it  drained on one occasion in the distant past.  She has never formally  been evaluated by a hand surgeon.   PHYSICAL EXAM:  Notable for a well-appearing female in no acute  distress.  She is afebrile.  Temperature 98.4 degrees Fahrenheit, blood  pressure 110/66, pulse 96 regular.  Auscultation of the chest reveals  clear breath sounds.  Examination of the left hand is notable for an  acutely inflamed proximal phalanx of the left middle finger with an  obvious open area of the skin where she has had surgical drainage  performed.  A small amount of thin  clear fluid can be expressed.  There  is no surrounding cellulitis extending to the proximal hand.  The  remainder of her physical exam is noncontributory.   IMPRESSION:  Ms. Norby has developed significant cellulitis soft tissue  infection with an abscess involving the proximal phalanx of her left  finger.  She apparently has had problems with this finger in the past as  well.  Under the circumstances, I think it would be unwise to proceed  with her elective mitral valve surgery.   PLAN:  We will postpone her surgery indefinitely and ask for a formal  evaluation by Dr. Amanda Pea.  Her cardiac surgery is completely elective  and can wait until the question regarding the possibility of any ongoing  infection can be completely resolved.   Salvatore Decent. [name], M.D.  Electronically Signed   CHO/MEDQ  D:  03/16/2010  T:  03/16/2010  Job:  [id number]   cc:   Marca Ancona, MD  Terrial Rhodes, M.D.  Charlaine Dalton. Sherene Sires, MD, [facility]  Lurena Nida, MD  Dionne Ano. Amanda Pea, M.D.

## 2011-02-23 NOTE — Assessment & Plan Note (Signed)
OFFICE VISIT   Katie [name], Katie [name]  DOB:  06/12/1966                                        September 14, 2010  CHART #:  [id number]   HISTORY OF PRESENT ILLNESS:  The patient returns for followup of her  chest wall infection and osteomyelitis.  She was last seen here in the  office on August 24, 2010.  Since then she has done remarkably well.  She reports that her pain is completely resolved.  She has no shortness  of breath.  In fact, she states that she can carry her son up 14 flights  of stairs without stopping.  She is eager to go back to work soon.  She  has no complaints.  She continues to take her ciprofloxacin and will  complete 3 months of therapy.  She does report that the last time her  prothrombin time INR checked it had fallen to 1.3.  This is being  addressed through the [facility].  The remainder of her  review of systems unremarkable.   PHYSICAL EXAMINATION:  Notable for well-appearing female with blood  pressure 112/77, pulse 92.  She is afebrile.  Examination of the chest  reveals a well-healed minithoracotomy scar.  There is no longer any  swelling nor tenderness across the anterior chest wall or sternum.  Palpation does not bother her at all.  Auscultation reveals clear breath  sounds that are symmetric bilaterally.  No wheezes, rales or rhonchi  noted.  Cardiovascular exam is notable for regular rate and rhythm with  mechanical heart sounds.  No murmurs, rubs or gallops are noted.  The  abdomen is soft, nontender.  The extremities are warm, well perfused.  The remainder of her physical exam is unrevealing.   IMPRESSION:  The patient appears to be doing extremely well.   PLAN:  We will go ahead and clear the patient to go back to work later  this month.  We will plan to see her back in 6 weeks with a followup  chest CT scan.  All of her questions have been addressed.   Salvatore Decent. [name], M.D.  Electronically Signed   CHO/MEDQ  D:  09/14/2010  T:  09/15/2010  Job:  [id number]   cc:   Lurena Nida, MD

## 2011-02-23 NOTE — H&P (Signed)
Katie [name], Katie [name]               ACCOUNT NO.:  [id number]   MEDICAL RECORD NO.:  [id number]          PATIENT TYPE:  INP   LOCATION:  9372                          FACILITY:  [facility]   PHYSICIAN:  Janine Limbo, M.D.DATE OF BIRTH:  11-Apr-1966   DATE OF ADMISSION:  03/28/2009  DATE OF DISCHARGE:                              HISTORY & PHYSICAL   HISTORY OF PRESENT ILLNESS:  The patient is a 44 year old single black  female gravida 3, para 0-1-1-0 at 34-1/7 weeks per an [facility] of May 08, 2009 who presented this morning with chief complaint of spontaneous  rupture of membranes around 6:50 a.m. when she rolled over in her bed.  She denied any UTI or PIH signs or symptoms.  No nausea, vomiting, GI  complaints or fever.  She did notice a slight pink tinge to the fluid  that was on her sanitary pad, but has had no vaginal bleeding to speak  of or red blood with wiping.  She reports having a bowel movement  yesterday.  She does report a chronic cough secondary to her pulmonary  fibrosis, but this morning has had no cough or dyspnea.  The overall leakage of fluid besides the pink tinge was clear.  She has  had no cramping or regular contractions.  She reports good fetal  movement.  She is expecting a boy.  She had been followed by Dr. Pennie Rushing  at [facility].  Her history has been remarkable for:  1. Lupus.  2. Pulmonary fibrosis.  3. Polymyositis.  4. Advanced maternal age.  She declined an amniocentesis but did have      normal first trimester screen.  5. Urinary incontinence.  6. Unknown GBS status.  7. Chronic proteinuria.  8. History of abnormal Pap/cryo 1998.  9. History of HSV II.  Denies any prodromal signs or symptoms or      lesions to speak of.  10.History of a 28-week IUFD and preterm delivery secondary to the      IUFD.  The baby did die.  Did have multiple anomalies as well as      having preeclampsia during that pregnancy.  11.History of fibroids.   ALLERGIES:  She reports METHOTREXATE causes severe nausea, otherwise no  other medication allergies.  No latex allergy and no other  sensitivities.   MENSTRUAL HISTORY:  She reports menarche at age 21.  She reports usually  21-day cycles with only 3 days of flow.  They have been slightly  irregular in the past.  She reported an LMP of August 01, 2008 giving  her best [place] of May 08, 2009.  She did have a dating ultrasound on  December 30 and it gave an [facility] of May 11, 2009 so her due date  consistent with LMP was kept.   OBSTETRICAL HISTORY:  Rhett Bannister 1 was a stillbirth at [redacted] weeks gestation,  approximately in May 1988.  She did have a female that did have multiple  anomalies.  It was a vaginal delivery.  Gravida 2 was an induced  abortion around [redacted] weeks gestation in 2004.  She had no complications  from that and gravida 3 is current pregnancy.   PRENATAL LABS:  Her blood type is AB positive, Rh antibody screen was  negative.  Sickle cell screen was negative.  RPR nonreactive, rubella  titer immune.  Hepatitis surface antigen was negative, HIV nonreactive.  Cystic fibrosis screen was negative.  Pap smear November 16, 2007 was  within normal limits.  Her hemoglobin at the time was 12.7, hematocrit  38.6.  Her platelets were slightly low equal to 140 and that was on  October 23, 2008.  She declined any gonorrhea and chlamydia cultures as  I mentioned.  She has not had GBS or any other cultures in her third  trimester.  A 1-hour Glucola was equal to 124.  Hemoglobin at that time  was 11.7 and RPR was negative.   PAST MEDICAL HISTORY:  1. She did have preeclampsia with her first pregnancy as well as a      preterm delivery secondary to IUD contraception in the past.  She      has used NovaRing and birth control pills.  2. She had a colposcopy and cryo in 2008.  Repeat Paps have been      normal.  She had a normal Pap July 2009.  3.  She does have history      of one fibroid noted in the  past.  3. 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  Last outbreak was in December      2009.  Periodically does use Valtrex for suppression.  4. She reports varicella as a child.  5. Normal childhood illnesses.  6. Has had hepatitis B vaccine.  7. The patient does have weakness and pulmonary fibrosis as well as      polymyositis.   PAST SURGICAL HISTORY:  Her only surgical history it looks like was the  wisdom teeth extraction.   GENETIC HISTORY:  Remarkable for patient being 45 years of age;  otherwise normal.   FAMILY HISTORY:  Maternal grandmother and maternal aunt with heart  disease.  Dad and paternal aunts x2 chronic hypertension.  Mother COPD  and emphysema and maternal grandmother adult onset diabetes.  Maternal  grandmother dialysis.  Sister and a niece with migraines.  Maternal aunt  breast cancer after menopause.  Mother bipolar.   SOCIAL HISTORY:  She is a single [place] American female.  Father of  baby's name is Dessie Coma.  He has a high school education and works  as __________.  I am unsure if he is involved and supportive.  He is not  present on her admission.  She was accompanied by her sister, Bonita Quin.  The patient has an associates degree in education and had been a full-  time bus driver.  She denied alcohol, tobacco or illicit drug use.   HISTORY OF PRESENT PREGNANCY:  The patient entered care for her new OB  interview at [facility] on January 13.  She was around 11 weeks and 4 days and  she also had her new OB workup on the same day.  She had prenatal labs  drawn.  She did have some early first trimester menstrual-like cramping,  some headaches and dizziness.  On her new OB workup exam,  Wynelle Bourgeois, certified nurse midwife, did note some thinning and tightening  of her skin, tight around her mouth and it does affect her speech.  Does  have some irregular chest excursion with deep breaths at her new OB  workup.   PLAN:  The patient was recommended amnio.  She  declined at that time but  did desire first trimester screen.  She did have genetic counseling at  the [facility] maternal fetal  medicine specialist, specifically Dr. Margot Ables, on January 12 regarding  advanced maternal age as well as her multiple comorbidities.  First  trimester screen was done on January 18 and it was within normal limits.  Plan was made at her approximately 14-week visit with Dr. Pennie Rushing for  her to have an early 24-hour urine as well as fetal cardiac echo around  20-22 weeks.  Plan was made for her to continue following a nephrologist  regarding history of chronic proteinuria as well as the polymyositis.  She also has followed a pulmonologist and she has had anesthesia consult  during the early pregnancy.  She was on 10 mg of prednisone daily which  she had been on for several years she reports and was on 1 mg of folate  daily as well as a prenatal vitamins.  Her early 24-hour urine at the  beginning of her second trimester showed 860 mg of total protein.   When she entered care on January 13, she had a weight of 198.  She  reported a pre gravid weight of 194.  She also was followed by a  rheumatologist regarding her lupus around 20 weeks.  She did have  anatomy ultrasound.  She is expecting a boy.  Ultrasound showed normal  fluid, normal growth and development as well as anatomy.  Cervix was  3.46 cm.  She did have a fibroid that was measuring 6.87 cm.  Her  medication list at that time was for continued prednisone 10 mg, a  prenatal vitamin, folate and she was on Imuran 50 mg b.i.d. The patient  did have a fetal echo on January 20, 2009 around 22 weeks.  She did start  measuring slightly size greater than dates and complained of some GERD  as well as some coughing with lying down.  She was started on Prevacid  at that time.  The patient did call early April toward the end of her  second trimester complaining of some pitting edema.   Comfort measures  were discussed and she was switched to Protonix because the Prevacid was  not helping with her cough.  Around that same time she had an ultrasound  on April 7 for size slightly greater than dates and ultrasound showed  age adjusted at 90 weeks which was consistent with her dating and she  had normal fluid.  She continued on her Imuran and prednisone, now the  Protonix, folic acid and Tylenol p.r.n.  She was continuing to follow at  Dr. Sherene Sires.  At 25-6/7 weeks, she was complaining of some insomnia  secondary to the coughing.  Was lying down at that time.  She did well.  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  She was given Phenergan suppository for her cough.  She had a 1-hour  Glucola at 25-6/7 and it was within normal limits equal to 124.  At the  end of April, she was started on her antenatal testing which started at  around 26 weeks.  At 26-4/7 she was measuring about 28 weeks.  Her  weight was 204.  She had an ultrasound which showed breech presentation,  normal fluid, 8/8 BPP and cervix was 4.76 cm.  The baby  was measuring 26  weeks.  She had 24-hour urine done on April 26 and total protein was 768  mg.  Hemoglobin on April 21 was 11.7 and RPR was nonreactive.  She did  begin having NSTs twice a week at that time.  Around 28 weeks one of the  other other specialists, Dr. Arrie Aran, M.D. had recommended starting  the patient on Lasix and was placed on 20 mg at that date daily,  secondary to retained fluid and orthopnea, dyspnea.  She did see the  pulmonologist.  At that time she had a BPP of 8/8, a nonreactive NST so  the ultrasound was obtained.  It had normal fluid in the 70th  percentile.  At 28-4/7 weeks, she was measuring 29 weeks.  Her weight  was 206.  Did complain of some decreased fetal movement.  Had a reactive  NST.  Another ultrasound was normal fluid and 80th percentile, breech  presentation still and cervix was 4.46 cm.   Continued having trouble  sleeping.  Her chest was clear.  Dr. Stefano Gaul did consult with Dr.  Marina Gravel and he did suggest increasing the Lasix to 40 mg  day which  ensued.  She did begin having at the end of April and first part of May,  3+ pitting edema bilateral lower extremities.  The patient continued to  follow her specialists, nephrology, pulmonologist and rheumatologist  throughout the pregnancy.  At 29-4/7 weeks, the patient had a followup  ultrasound and estimated fetal weight was in the 74th percentile, normal  fluid.  Cervix was 3.14 cm, anterior placenta 8/8 BPP, breech.  Lasix  was decreased secondary to decreasing blood pressure 20 mg p.o. daily.  Her weight was 203.  At 30-4/7 weeks, the patient's  weight was 202.  __________ was measuring size equal to dates.  Her medication list  included her Lasix, Imuran, prednisone, folate, prenatal vitamin,  calcium as well as Tylenol p.r.n.  She was scheduled to have pulmonary  function tests with Dr. Sherene Sires as well as anesthesia consult.  She had  ultrasound that day with a 8/8 BPP and AFI was 80th percentile.  Her  pulmonary function tests are on her chart which were done by Dr. Sherene Sires  and they were done on June 9.  She did have a consult with Dr. Blenda Peals  which was on June 10 and recommendation had been for the patient to have  amniocentesis at [redacted] weeks gestation and with documentation of mature  fetal lung status, to proceed with induction of labor and delivery with  possibility of assisted second stage secondary to her pulmonary status  as well as stress dose steroids during labor and delivery.  Please see  Dr. Victorino Dike [facility] consult for any other further details.  The  patient's pregnancy continued to progress until her presentation this  morning with PPR0M.  She did have an ultrasound in the office yesterday  for her BPP and fluid status.   OBJECTIVE:  VITAL SIGNS:  On admission her vital signs were blood  pressure  102/74, heart rate 91, respirations 21 and she was afebrile.  Fetal heart rate was in the 140s with moderate variability,  some 10 x  10 accelerations in MAU  with some brief mild variables which were  intermittent, overall reassuring.  Toco showed occasional ripple, but  the patient did not discern and there was good resting tone.  GENERAL:  No acute distress, alert and oriented x3.  She was very  pleasant.  She does have some drooping as well as some alternate  tightening of her skin facially especially around the mouth which does  affect her speech. CARDIOVASCULAR:  Regular rate and rhythm.  LUNGS:  Lungs were clear but definitely decreasing sounds which relate  to probably restrictive airway.  ABDOMEN:  Soft, nontender and gravid.  No rebound or guarding.  PELVIC:  Sterile spec exam positive pooling, large amount of clear fluid  at introitus as well as externally.  No HSV lesions were noted.  Secondary to a copious fluid sitting and pooling in the vault, unable to  visualize cervix or further vaginal canal.  Bimanual exam showed the  cervix was closed, long and high, difficulty differentiating presenting  part, so a bedside ultrasound was done and vertex was confirmed.  EXTREMITIES:  She does have 3-4+ pitting edema in her bilateral lower  extremities with some bruising along her shin, especially left lateral  shin, which is above the knee.  She does have 1+ pitting edema.  She  does not have any clonus and her DTRs were 1+ bilaterally.   IMPRESSION:  1. Intrauterine pregnancy at 34-1/7 weeks.  2. Prolonged premature rupture of membranes at 6:50 a.m. on March 28, 2009.  3. Reassuring fetal heart tracing overall but not yet reactive.  4. Unknown GBS status and culture was collected in MAU today on      admission.  5. Lupus.  6. Advanced maternal age.  7. Pulmonary fibrosis.  8. Polymyositis.   PLAN:  After arrival and assessment, did consult with Dr. Stefano Gaul and  his  original plan was to admit the patient to antenatal and began on  PPROM antibiotic protocol as well as steroid shot for the patient.  However, after maternal fetal medicine consult, Dr. Roney Mans did  recommend, secondary to now PPROM status and medical condition, to  induce labor for delivery.  Following this recommendation the patient was admitted to labor and delivery.  She did receive PIH labs.  She was  to have a Foley to straight drain and start 24-hour urine to send stand  for specimen for urinalysis.  Dr. Stefano Gaul is to follow with further  plan of care regarding the patient's induction of labor and with MD  management.        Candice Denny Levy, [place]      Janine Limbo, M.D.  Electronically Signed    CHS/MEDQ  D:  03/28/2009  T:  03/29/2009  Job:  [id number]

## 2011-02-23 NOTE — Assessment & Plan Note (Signed)
OFFICE VISIT   Katie [name], Katie [name]  DOB:  [birth year]                                        April 20, 2010  CHART #:  [id number]   HISTORY OF PRESENT ILLNESS:  The patient returns for followup of mitral  stenosis, mitral regurgitation, and tricuspid regurgitation.  She was  originally seen in consultation on June 30, 2009, and a more recent  history and physical exam was dictated on Feb 23, 2010.  Since then, she  developed soft tissue infection of the proximal phalanx of the left  third digit, which required postponing her planned heart surgery  previously scheduled last month.  She was seen in consultation by Dr.  Dominica Severin, and her soft tissue infection has resolved without  further surgical intervention.  She has now been cleared for surgery and  returns for further followup.  She reports that overall she feels well.  Her appetite is good.  She has no significant change in shortness of  breath.  She does report a mild dry persistent nonproductive cough.  She  denies PND, orthopnea, or lower extremity edema.  She has not had chest  pain.  She has not had fevers or chills.  Her appetite is good.  Bowel  function is stable.  The remainder of her review of systems is  unremarkable.  The remainder of her past medical history is unchanged.   CURRENT MEDICATIONS:  1. Prednisone 8 mg daily.  2. Plaquenil 400 mg daily.  3. Furosemide 40 mg daily.  4. Potassium chloride 20 mEq daily.  5. Multivitamin 1 tablet daily.  6. Revatio 80 mg 3 times daily.  7. Coumadin 7.5 mg 5 days a week and 5 mg 2 days weekly.   PHYSICAL EXAMINATION:  General:  Notable for well-appearing female with  blood pressure 116/78, pulse 79 and regular, oxygen saturation 100% on  room air.  HEENT:  Unrevealing.  Chest:  Auscultation of the chest  reveals clear breath sounds that are symmetrical bilaterally.  No  wheezes, rales, or rhonchi are noted.  Cardiovascular:  Notable for  regular rate and rhythm.  There is a soft systolic murmur heard along  left sternal border.  No diastolic murmurs are noted.  Abdomen:  Obese  but soft, nontender.  Extremities:  Warm and well perfused.  There is no  lower extremity edema.  The swelling and infection involving the left  third digit has completely resolved.  No other abnormalities are noted.   IMPRESSION:  The patient is clinically stable and desires to proceed  with elective mitral valve repair or replacement and possible tricuspid  valve repair as soon as practical.  She hopes to recover during the  several months so that she can get back to work as soon as possible this  fall.   PLAN:  We plan to proceed with right miniature anterolateral thoracotomy  for mitral valve repair or replacement with possible tricuspid valve  repair on Friday, May 01, 2010.  Based upon functional anatomy of the  mitral valve, I suspect that the patient's mitral valve will likely need  to be replaced.  Under these circumstances, we would replace it using a  mechanical prosthesis.  If valve repair is feasible with the potential  for long-term durable result, we will attempt repair.  This seems to be  probably unlikely.  She understands and accepts all potential associated  risks of surgery including but not limited to risk of death, stroke,  myocardial infarction, congestive heart failure, respiratory failure,  renal failure, bleeding requiring blood transfusion, heart block with  bradycardia requiring permanent pacemaker, late complications related to  valve repair or replacement, variety of complications particularly  related to use of mini thoracotomy approach and femoral artery  cannulation.  All of their questions have been addressed.   Salvatore Decent. [name], M.D.  Electronically Signed   CHO/MEDQ  D:  04/20/2010  T:  04/21/2010  Job:  [id number]   cc:   Charlaine Dalton. Sherene Sires, MD, [facility]  Marca Ancona, MD  Terrial Rhodes, M.D.  Lurena Nida, MD  Dionne Ano. Amanda Pea, M.D.

## 2011-02-23 NOTE — Letter (Signed)
October 22, 2008    Vanessa P. Haygood, M.D.  [address], [address]  [place]  [place] [address]   RE:   JAHNYA, TRINDADE  MR#   [id number]  ACC#        [id number]   MFM CONSULTATION REPORT   Dear Dr. Pennie Rushing:   I had the pleasure of seeing your patient, Katie [name], for a  pregnancy consultation today on October 22, 2008.  As you know, she is a  45 year old gravida 3, para 1, living children 0, African American  female whose pregnancy is complicated by a diagnosis of polymyositis and  lupus.  In addition to the history in her prenatal records, she gave [place]  the following history.  She was diagnosed with SLE in October 1990.  She  reports that she has been followed by a rheumatologist and is currently  taking prednisone.  She reports having mild flares approximately every 2  months, the last occurring in October.  The flares are such that she has  tiredness and weakness and needs to rest and usually it does not involve  increasing her medications.  She also give a history of pulmonary  fibrosis, but reports no work restriction or exercise intolerance.   Secondly, she was diagnosed with polymyositis secondary to having had  muscle aching and weakness shortly after her diagnosis of SLE.   PREGNANCY HISTORY:  First pregnancy:  The patient gives a history of  having had a delivery in [place] in 1988.  In this pregnancy, she had  a stillbirth at approximately 7 months gestation.  She did not know or  does not remember being told the reason for the stillbirth.  She reports  having had an amniocentesis earlier in pregnancy but understood that the  chromosomes were normal.  In addition, she reported no birth defect with  the baby at the time of delivery and complicating this pregnancy was  also a diagnosis of preeclampsia.  Second pregnancy:  It was an elective  termination of pregnancy.  Third pregnancy is the current pregnancy.   She reports being followed by a rheumatologist,  Dr. Dareen Piano, and is  scheduled for a followup visit with him on November 16, 2008.  She also  reports an appointment with the nephrologist scheduled for November 08, 2008, for followup of proteinuria noted by the rheumatologist.   ASSESSMENT:  This is a 45 year old gravida 3, para 1, living children 0,  African American female with the following complications of her  pregnancy.  1. Systemic lupus erythematosus.  2. Polymyositis.  3. Advanced maternal age.  4. History of a third trimester loss.  5. History of preeclampsia.   I discussed with her the potential complications of SLE and polymyositis  in her pregnancy.  Specifically, there is an increase risk of the  development of preeclampsia with these autoimmune conditions and the  risk of increased preterm birth.  There also is an increased risk of  late spontaneous abortion especially if complicated by a flare of either  of her autoimmune disorders.  There also is an increased risk of growth  restriction and stillbirth and some reports if there are flares of these  conditions, there could be greater than 50% loss rate during the  pregnancy.  I did discuss that various medications of it could  potentially be used such as steroids and intravenous immunoglobulin and  IVIG are safe to use in pregnancy.   I would recommend the following management plan for  her pregnancy.  1. She will need every 2 weeks visit up to approximately 24 weeks'      gestation, from 24 weeks' gestation to delivery, I would recommend      weekly office visit.  2. Obtain baseline 24-hour urine and baseline complete metabolic panel      profiles due to her increased risk of preeclampsia.  3. Serial scans for growth.  I would recommend the following after      anatomic survey at 18-20 weeks.  I would recommend ultrasounds      every 3 weeks throughout the gestation.  If there is evidence of      lagging growth, ultrasounds can be done as frequently as every 2       weeks for growth.  4. I would recommend close fetal surveillance due to the significant      risk of fetal loss and fetal growth restriction and stillbirth.  I      would recommend weekly biophysical profiles beginning at 24 weeks'      gestation.  At 32 weeks' gestation, I would begin initiation of      twice-weekly NSTs with 1 NST coupled with a full biophysical      profile.  Again if there is evidence of lagging growth or decreased      amniotic fluid volume, umbilical artery Doppler studies should also      be performed.  5. I also would obtain an Anesthesia consult secondary to potential      anesthesia considerations with respiratory muscle weakness or      respiratory complications secondary to her pulmonary fibrosis.   Lastly, I did discuss with her that she should make plan for potential  hospitalization with her pregnancy due to her significant risks.  So, I  informed her that there would be multiple office visits and that she may  want to inform her employers that she may need time off from work in  order to make all of her scheduled appointments.  I answered all of her  questions and she seems satisfied with the information which I gave her.   She also met today with Dr. Renata Caprice to discuss her risk of karyotype  abnormalities based upon her advanced maternal age (age 66).  We will  attempt to obtain records from her 1988 delivery which may alter some of  the counseling done by the genetic counselor.  If you have any questions  or would like further information, please feel free to contact me.  We  will be glad to co-manage her pregnancy with you and I will be more than  happy to perform any of the antenatal testing as outlined above.   Sincerely,      Felipa Eth, MD      DCM/MEDQ  D:  10/22/2008  T:  10/23/2008  Job:  [id number]

## 2011-02-23 NOTE — Assessment & Plan Note (Signed)
OFFICE VISIT   Katie [name], Katie [name]  DOB:  1966/06/05                                        December 01, 2009  CHART #:  [id number]   The patient returns for followup today of mitral stenosis and mitral  regurgitation.  She was originally seen in consultation on June 30, 2009 and a full consultation report and history and physical exam was  dictated at that time.  Since then, the patient has done remarkably well  on medical therapy.  She went back to work in October, and her shortness  of breath has continued to improve.  She states that she can now walk up  a flight of stairs without having to stop to catch her breath.  She is  no longer using oxygen at home.  Her son is now 91-month-old, and he is  doing well.  She was seen in followup by Dr. [name] and a followup right  heart catheterization was performed on November 03, 2009.  This  demonstrated significant improvement in her pulmonary hypertension with  PA pressures measured 45/21, decreased from 72/28 at the time of her  cath in September.  Pulmonary vascular resistance had dramatically  fallen to 2.3 Wood units.  Of note, there remained prominent V-waves on  pulmonary capillary wedge tracing, suggesting that of course the  severity of mitral regurgitation remains significant.  A repeat  echocardiogram was performed on November 12, 2009.  This revealed normal  left ventricular size and systolic function with ejection fraction 60-  65%.  The rheumatic appearance of the mitral valve remained consistent  and valve area by pressure half time was estimated 1.46 cm2.  Using the  continuity equation, the valve area was estimated to be 1.15 cm2.  There  remained significant mitral regurgitation and moderate left atrial  enlargement.  No other significant abnormalities were noted.  There was  only mild tricuspid regurgitation.  The patient has been referred back  to our office to consider elective mitral valve  repair or replacement.   I reviewed matters at length with the patient here in the office today.  She has done remarkably well on medical therapy.  Although there is  nothing forcing her to consider proceeding with surgery at this point in  time, I agree with Dr. [name] that given the severity of her mitral  stenosis and mitral regurgitation, it would probably be wise to consider  proceeding with surgery.  She is doing well and her heart failure is  well compensated.  I discussed the rationale for this at length with the  patient and all of her questions have been addressed.  She desires to  wait until this school year has finished when she will have some time  off from work.  Under these circumstances, this seems reasonable.  We  will have her return in May for further followup appointment, and make  definitive plans for surgery sometime in June or July.  All of her  questions have been addressed.   Salvatore Decent. [name], M.D.  Electronically Signed   CHO/MEDQ  D:  12/01/2009  T:  12/02/2009  Job:  [id number]   cc:   Marca Ancona, MD  Terrial Rhodes, M.D.  Charlaine Dalton. Sherene Sires, MD, [facility]  Lurena Nida, MD

## 2011-02-23 NOTE — Assessment & Plan Note (Signed)
OFFICE VISIT   Katie [name], Katie [name]  DOB:  01-14-1966                                        August 26, 2010  CHART #:  [id number]   The patient underwent followup CT scan of the chest today.  This reveals  findings consistent with the fact that the small fluid collection in the  right breast has completely resolved and the majority of all the soft  tissue stranding across her anterior chest related to her cellulitis and  mastitis has also resolved.  The CT scan does reveal even more  convincing evidence for the presence of some involvement of the sternum  at the sternomanubrial junction.  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  This area also corresponds to the area where  she has mild tenderness on palpation on physical exam.  I discussed  these findings over the telephone with Dr. Johny Sax from the  Infectious Disease team at [facility].  Under the circumstances,  he suggests that she will require at least very minimum of 3 months of  antibiotic therapy.  As long as she has good absorption of oral  ciprofloxacin, this should remain an excellent choice for her therapy.  Because she has clinically improved, this seems like a reasonable plan.  We will plan to see the patient back in followup as previously outlined  and ultimately, she will require another followup CT scan in 6 or 8  weeks.   Salvatore Decent. [name], M.D.  Electronically Signed   CHO/MEDQ  D:  08/26/2010  T:  08/26/2010  Job:  [id number]   cc:   Marca Ancona, MD  Lurena Nida, MD

## 2011-02-23 NOTE — Assessment & Plan Note (Signed)
OFFICE VISIT   Katie [name], Katie [name]  DOB:  02/14/1966                                        June 12, 2010  CHART #:  [id number]   HISTORY OF PRESENT ILLNESS:  The patient returns to the office for  follow up of right mini thoracotomy for mitral valve replacement on  May 13, 2010, and subsequent emergency subxiphoid pericardial window  performed by Dr. Donata Clay on May 30, 2010.  The patient did very  well initially after her heart surgery, but she developed shortness of  breath and congestive heart failure 2 weeks after hospital discharge.  She was admitted to the hospital and an echocardiogram was performed  demonstrating large pericardial effusion with some suggestion of early  signs of pericardial tamponade.  She underwent emergency subxiphoid  pericardial window by Dr. Donata Clay on May 30, 2010.  She did very  well initially after this procedure, although later during her  hospitalization she developed acute onset of right hip pain.  At that  time, she was on intravenous heparin while she was resuming Coumadin  therapy.  A CT scan of the abdomen and pelvis was performed  demonstrating the presence of a moderate-sized hematoma in the right  iliopsoas muscle.  Heparin was discontinued.  Her hemoglobin stabilized.  Ultimately, she was discharged home.  She was seen in follow up by Dr.  [name] earlier this week and a prothrombin time was checked as well as a  complete blood count.  Her hemoglobin had actually gone up from the time  of hospital discharge.  Her INR was supratherapeutic with an INR of 4.1.  Her Coumadin dose was adjusted.  She returns to our office for further  follow up today.  She states that she still has pain in her right hip.  Pain is relieved if she flexes her head.  She has not had any shortness  of breath.  She has not had any fevers or chills.  The soreness in her  upper abdomen is gone, but she has had some drainage from her  surgical  incision.  She otherwise feels well.  The remainder of her review of  systems is unremarkable.   PHYSICAL EXAMINATION:  Notable for well-appearing female with blood  pressure 107/70, pulse 110 and regular, oxygen saturation 92%.  Auscultation of the chest reveals clear breath sounds that are  symmetrical bilaterally.  Examination of the upper abdomen notes that  the surgical incision from her subxiphoid pericardial window is intact,  but there was some drainage emanating from the inferior aspect along  where there was some drainage around subcutaneous knot from Vicryl  suture.  The chest tube suture was removed.  The Vicryl stitch was  removed and the incision was cleaned out.  The abdomen is soft and  nontender.  The extremities are warm and well perfused.  The remainder  of her physical exam is noncontributory.   DIAGNOSTIC TESTS:  CT scan of the pelvis performed today at the  [facility] is reviewed and compared with the previous CT  scan performed previously.  This demonstrates essentially stable  radiographic appearance of the moderate-sized hematoma in the right  iliopsoas muscle.  This has not gotten substantially bigger in size and  no new findings are identified.  Complete blood count performed today is  notable for hemoglobin of 9.8 with hematocrit of 30%.  Her hemoglobin  was 10.1 with hematocrit of 30.6% on June 08, 2010.  Prior to that, it  was 8.6 with hematocrit of 27% on June 06, 2010, while she was still  in the hospital.   IMPRESSION:  The patient appears stable at this point.  Her right  iliopsoas retroperitoneal hematoma appears stable and her hemoglobin has  not dropped significantly this week despite the fact that her INR has  been too high.  She has developed some drainage from her subxiphoid  pericardial window incision that may have been related to subcutaneous  suture which has now been cleaned and removed.   PLAN:  We will plan  to see the patient back for follow up on June 22, 2010, to make sure her subxiphoid pericardial window incision  continues to heal.  She will call and return sooner should she have any  further problems with this in terms of increased drainage, the  development of cellulitis, fevers, or chills.  We will also check a  hemoglobin and hematocrit at the time of her next office visit to make  sure it remains stable.  She has been advised to avoid any significant  strenuous activity and to be particularly careful with motion and  flexion of her hip.  For the time being, I think it would probably be  wise that she not drive an automobile.  All of her questions have been  addressed.   Salvatore Decent. [name], M.D.  Electronically Signed   CHO/MEDQ  D:  06/12/2010  T:  06/13/2010  Job:  [id number]   cc:   Marca Ancona, MD  Charlaine Dalton. Sherene Sires, MD, [facility]  Terrial Rhodes, M.D.  Lurena Nida, MD  Kerin Perna, M.D.

## 2011-02-23 NOTE — H&P (Signed)
HISTORY AND PHYSICAL EXAMINATION   Feb 23, 2010   Re:  Katie [name], Katie [name]         DOB:  [birth year]   Date. Of planned hospital admission and surgery March 17, 2010.   PRESENTING CHIEF COMPLAINT:  Shortness of breath.   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  The patient was  first diagnosed with lupus in 1990.  She has been treated with steroids  ever since.  She developed nephrotic syndrome during pregnancy and was  evaluated by Dr. Arrie Aran.  Her creatinine is normal.  She has also  been diagnosed with chronic cough and interstitial lung disease, for  which she has been followed by Dr. Sandrea Hughs.  She describes a 2-year  history of progressive symptoms of orthopnea and exertional shortness of  breath.  After she became pregnant, she developed class IV congestive  heart failure.  Echocardiogram was performed demonstrating mitral  stenosis and mitral regurgitation.  She was fully evaluated from a  cardiac standpoint by Dr. Marca Ancona.  Transesophageal echocardiogram  and left and right heart catheterization were performed last September.  She was noted to have moderate mitral stenosis with at least moderate  mitral regurgitation.  There is moderate tricuspid regurgitation.  She  is noted to have normal coronary artery anatomy with no significant  coronary artery disease.  She had severe pulmonary hypertension at the  time of her right heart catheterization at that time.  She was initially  treated medically, and she was reevaluated in January of this year.  Repeat right heart catheterization demonstrated considerable  improvement.  Despite this substantial fall in pulmonary artery  pressures on medical therapy, she still had prominent V-waves on  pulmonary capillary wedge tracing consistent with severe  mitral  regurgitation.  Repeat echocardiogram performed in early February  demonstrated normal left ventricular systolic function with moderate  mitral stenosis and at least moderate mitral regurgitation.  She was  referred back to our office to consider elective mitral valve repair or  replacement.  This school year is now approaching the end, and she is  eager to proceed with surgery as soon as practical.  She hopes to  recover during the summer, so that she can be back to normal by next  fall when the school year resumes.  She has otherwise been clinically  stable.   REVIEW OF SYSTEMS:  Overall, unchanged from previously.  The patient  reports normal appetite.  She has not been gaining nor losing weight  recently.  She has stable mild exertional shortness of breath.  She is  no longer using oxygen.  She denies any chest pain.  She has not had any  tachy palpitations or dizzy spells.  She has not had any productive  cough.  Bowel function is regular.  The remainder of her review of  systems is noncontributory.   PAST MEDICAL HISTORY:  1. Mitral stenosis and mitral regurgitation, presumably rheumatic      etiology.  2. Systemic lupus erythematosus.  3. Congestive heart failure.  4. Pulmonary hypertension.  5. Interstitial lung disease.  6. Chronic renal insufficiency with normal baseline creatinine.   PAST SURGICAL HISTORY:  Cesarean section with bilateral tubal ligation.   SOCIAL HISTORY:  The patient is single, but lives with a boyfriend and a  2-month-old son here in [place].  She works as a Surveyor, mining  for [employer].  She is a nonsmoker.  She denies  alcohol consumption.   CURRENT MEDICATIONS:  1. Prednisone.  2. Folic acid.  3. Furosemide.  4. Revatio.  5. Coumadin.  6. Imuran.  7. PhosLo.  8. Potassium chloride.   PHYSICAL EXAMINATION:  Notable for well-appearing, moderately obese  female with blood pressure 116/77, pulse 80 and  regular, and oxygen  saturation 100%.  HEENT exam is unrevealing.  The neck is supple.  There  is no cervical nor supraclavicular lymphadenopathy.  There is no jugular  venous distention.  No carotid bruits are noted.  Auscultation of the  chest reveals clear breath sounds, which are symmetrical bilaterally.  Cardiovascular exam is notable for regular rate and rhythm.  Systolic  murmur is barely appreciated at the apex.  The abdomen is soft,  nondistended, and nontender.  The extremities are warm and adequately  perfused.  There is no lower extremity edema.  Distal pulses are barely  palpable bilaterally.  There is no sign of obvious significant lower  extremity venous insufficiency.  Rectal and GU exams are both deferred.  Neurologic examination is grossly nonfocal and symmetrical throughout.   IMPRESSION:  Moderate to severe mitral stenosis with moderate to severe  mitral regurgitation, likely rheumatic origin.  The possibility that  this is related to lupus is also significant.  The patient has  longstanding history of congestive heart failure as well as pulmonary  hypertension, which has responded to medical therapy.  She is clinically  quite stable at present.  The rationale for surgical intervention has  been reviewed in detail.  Based upon functional anatomy, I suspect that  her mitral valve will need to be replaced rather than repaired.  She may  need concomitant tricuspid valve repair depending upon dilatation of the  right ventricle and tricuspid annulus.   PLAN:  I have discussed matters at length with the patient here in the  office today.  She desires to proceed with surgery in the near future.  We will obtain CT angiogram to evaluate the possibility of significant  aortoiliac occlusive disease that would preclude femoral artery  cannulation.  We will tentatively plan to proceed with surgery on  Tuesday, March 17, 2010 via right minithoracotomy.  If she has significant   aortoiliac disease, we may need to use axillary artery cannulation or  direct aortic cannulation.  Based upon her age, we would favor use of a  mechanical prosthesis to replace her mitral valve if it cannot be  repaired.  She understands that this would come with long-term need for  anticoagulation with Coumadin.  We will see her back next week after her  CT angiogram.  We will make definitive plans for surgery and stop her  Coumadin at that time.   Salvatore Decent. [name], M.D.  Electronically Signed   CHO/MEDQ  D:  02/23/2010  T:  02/24/2010  Job:  [id number]   cc:   Marca Ancona, MD  Terrial Rhodes, M.D.  Charlaine Dalton. Sherene Sires, MD, [facility]  Lurena Nida, MD

## 2011-02-23 NOTE — H&P (Signed)
HISTORY AND PHYSICAL EXAMINATION   June 30, 2009   Re:  Katie [name], Katie [name]         DOB:  1966-08-30   REQUESTING PHYSICIAN:  Marca Ancona, MD   REASON FOR CONSULTATION:  Severe mitral stenosis and mitral  regurgitation.   HISTORY OF PRESENT ILLNESS:  The patient is a 45 year old African  American female from [place] with no previous cardiac history but  past medical history notable for a longstanding history of lupus with  secondary chronic renal insufficiency and interstitial lung disease with  pulmonary hypertension.  The patient states that she was first diagnosed  with lupus in 1990.  She presented with diffuse arthritis, arthralgias,  muscle pains and diffuse skin rash, characteristic of patients with  lupus.  She has been treated with steroids ever since.  More recently,  she has developed nephrotic syndrome and she has been evaluated by Dr.  Arrie Aran.  In addition, she has been diagnosed with chronic cough and  interstitial lung disease for which she has been followed by Dr. Sherene Sires.  She states that she has had a dry, nonproductive cough that has  persisted for several years.  Over the last year and a half, she had  developed worsening orthopnea and exertional shortness of breath.  This  developed before she ever got pregnant, and approximately 1 year ago she  got pregnant.  During the course of her pregnancy, she had some  increased problems with lower extremity edema and swelling, and  ultimately she delivered via cesarean section approximately 3 months  ago.  Postoperatively, she was found to have pulmonary edema and  hypoxemia.  An echocardiogram was performed demonstrating mitral  stenosis and at least moderate mitral regurgitation.  She was ultimately  discharged home on home oxygen therapy.  Over the last 3 months her  symptoms have improved somewhat, but she remains oxygen dependent at  home for sleeping and she reports persistent severe  orthopnea as well as  exertional shortness of breath.  She has not been able to go back to  work.  She was seen in follow up by Dr. [name] and subsequently brought  in for transesophageal echocardiogram and both, left and right heart  catheterization on September 15.  She was found to have moderate mitral  stenosis and moderate mitral regurgitation with severe pulmonary  hypertension.  There is normal coronary artery anatomy with no  significant coronary artery disease.  Arrangements are being made for  her to start sildenafil to treat her severe pulmonary hypertension, and  elective surgical consultation was requested.   REVIEW OF SYSTEMS:  GENERAL:  The patient reports normal appetite.  Her  energy level is normal.  She is 5 feet 5 inches tall and weighs  approximately 175 pounds.  CARDIAC:  The patient describes a long history of orthopnea and  exertional shortness of breath which she states developed approximately  1 year prior to becoming pregnant and became significantly exacerbated  during the course of her pregnancy.  Her symptoms have improved over the  last 3 months since her cesarean section, but she still has significant  orthopnea and exertional shortness of breath.  She denies resting  shortness of breath or PND.  She has not had any tachy palpitations.  She denies any chest pain.  She has had some lower extremity edema  previously which has been much better over the last several weeks on  diuretic therapy.  RESPIRATORY:  Notable for exertional shortness of breath,  orthopnea as  well as a dry, persistent, nonproductive cough.  The patient still uses  oxygen at home, particularly when she is sleeping at night and  intermittently during the daytime.  She denies hemoptysis or wheezing.  GASTROINTESTINAL:  Negative.  The patient has no difficulty swallowing.  She denies hematochezia, hematemesis, melena.  MUSCULOSKELETAL:  Notable for the absence of any recent problems with   arthritis or arthralgias.  Symptoms related to lupus have been well  controlled on medical therapy.  NEUROLOGIC:  Negative.  The patient reports occasional headaches and  occasional mild dizzy spells.  However, she otherwise denies any  significant dizziness, syncope, transient neurologic deficits.  PERIPHERAL VASCULAR:  Negative.  PSYCHIATRIC:  Negative.  HEENT:  Negative.  The patient has reports that she needs to have some  dental work done and she has a Education officer, community that she sees regularly.  Her  dentist is not aware of her recently discovered valvular heart disease.   PAST MEDICAL HISTORY:  1. Mitral valve stenosis and mitral regurgitation.  2. Congestive heart failure.  3. Pulmonary hypertension.  4. Interstitial lung disease.  5. Lupus.  6. Chronic renal insufficiency with nephrotic syndrome.   PAST SURGICAL HISTORY:  Cesarean section with bilateral tubal ligation,  June 2010.   SOCIAL HISTORY:  The patient is single but lives with her boyfriend and  her 82-month-old son here in [place].  Prior to having her child, she  worked as a Surveyor, mining for [employer].  She  has not been able to return to work due to continued need for home  oxygen therapy.  She is a nonsmoker.  She denies any alcohol  consumption.   CURRENT MEDICATIONS:  Prednisone 10 mg daily, folic acid 1 mg daily,  Plaquenil 40 mg daily, enalapril 2.5 mg daily, Integra 1 tablet daily,  prenatal vitamin once daily, Tylenol as needed, furosemide 40 mg every  morning and 20 mg every evening.   DRUG ALLERGIES:  Methotrexate causes vomiting.   PHYSICAL EXAMINATION:  General:  The patient is a well-appearing African  American female who appears her stated age in no acute distress.  HEENT:  Grossly unrevealing.  Neck:  Supple.  There is no cervical nor  supraclavicular lymphadenopathy.  There is no jugular venous distention.  Lungs:  Auscultation of the chest demonstrates clear breath sounds  which  are symmetrical bilaterally.  No wheezes, rales or rhonchi are noted.  Cardiovascular:  Notable for regular rate and rhythm.  There is no  opening snap.  I do not appreciate a murmur on exam today.  Abdomen:  Soft, nondistended and nontender.  Bowel sounds are present.  Extremities:  Warm and adequately perfused.  Femoral pulses are  palpable.  Distal pulses are thready but palpable.  There is no sign of  significant venous insufficiency.  There is trace lower extremity edema  at the ankle.  Rectal and GU:  Deferred.  Neurologic:  Grossly nonfocal  and symmetrical throughout.   DIAGNOSTIC TESTS:  Transesophageal echocardiogram performed on June 25, 2009 by Dr. [name] is reviewed.  This demonstrates moderate mitral  stenosis and at least moderate mitral regurgitation.  Functional anatomy  of the mitral valve is consistent with rheumatic heart disease.  There  is restriction of the mobility above the anterior and posterior leaflets  of  the mitral valve during both systole and diastole with a  characteristic hockey-stick deformity of the leaflets.  The anterior  leaflet does move  some, and although there is significant mitral  stenosis it does not appear to be critical.  There is at least moderate  (3+) mitral regurgitation with a broad central jet of regurgitation that  fills the left atrium.  The left atrium is enlarged.  The left  ventricular size and function appears normal.  The aortic valve appears  normal and there is no aortic insufficiency.  There is moderate (2+)  tricuspid regurgitation with mild to moderate right ventricular  dilatation.  There does appear to be a small patent foramen ovale.  No  other abnormalities are noted.   Left and right heart catheterizations performed by Dr. [name] on  September 15 is reviewed.  This demonstrates normal coronary artery  anatomy with no significant coronary artery disease.  There is moderate  mitral stenosis with a mean  transvalvular gradient measured 12.5 mmHg  and mitral valve area calculated at 1.3 cm2 by the Gorlin equation.  Cardiac output is 4.7 liters per minute corresponding to a cardiac index  of 2.5 using thermodilution.  There is severe pulmonary hypertension  with PA pressure is measured at 72/28.  Pulmonary capillary wedge  pressure is 23.  The transpulmonary gradient was estimated 27 mmHg  corresponding to pulmonary vascular resistance of 5.7 Wood units.  Central venous pressure is 12.  There is normal left ventricular  systolic function.  There is moderate mitral regurgitation.   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  This is  further complicated by the patient's underlying chronic interstitial  lung disease with pulmonary hypertension.  The patient's symptoms were  exacerbated by her recent pregnancy and cesarean section, and she has  improved on medical therapy.  Ultimately, she will likely need surgical  intervention for definitive management of her mitral valve disease,  although it remains unclear how much of her shortness of breath and  pulmonary hypertension is related to her mitral valve disease and how  much is related to her underlying chronic lung disease.  Based upon her  recent transesophageal echocardiogram, it is possible that her mitral  valve could be repaired, although I suspect that it will likely need to  be replaced.  Based upon her age, this would probably imply replacing  her valve using a mechanical prosthesis with secondary need for lifelong  anticoagulation.   PLAN:  I have discussed matters at length with the patient here in the  office today.  Given that she has a 52-month-old child at home, I think  it is entirely reasonable to continue to follow her carefully and see  how she does on medical therapy in the short term.  Ultimately, she will  likely need surgical  intervention, and I do think she would probably be  a candidate for minimally invasive approach for treatment of her mitral  valve disease.  Risks of surgery will be increased due to the presence  of significant pulmonary hypertension with chronic interstitial lung  disease, and the potential for her to develop significant right  ventricular failure must be monitored carefully.  The patient will  continue to follow up with Dr. [name] in the short term.  I have offered  a followup appointment here at any point in future depending on how she  does on medical therapy.  I have reminded her that she needs to see her  dentist and to let her dentist know that she has underlying valvular  heart disease.  I have also cautioned her that she will need to be  diligent with her long-term medical followup, as it would be disastrous  if we miss an opportunity to intervene surgically while she remains in  reasonably good shape.  Once she develops significant right ventricular  failure, the risks associated with surgical intervention will be much  higher.  All of her questions have been addressed.      Salvatore Decent. [name], M.D.  Electronically Signed   CHO/MEDQ  D:  06/30/2009  T:  07/01/2009  Job:  [id number]   cc:   Terrial Rhodes, M.D.  Azzie Roup, MD  Charlaine Dalton. Sherene Sires, MD, [facility]  Marca Ancona, MD

## 2011-02-23 NOTE — Assessment & Plan Note (Signed)
OFFICE VISIT   Katie [name], Katie [name]  DOB:  1966/09/06                                        August 11, 2010  CHART #:  [id number]   HISTORY:  The patient returns to the office today for followup of her  anterior chest wall cellulitis.  She was last seen here in the office  last week on August 03, 2010.  She had presented with acute chest pain  and some swelling over her anterior sternum.  CT scan of the chest  revealed soft tissue stranding and inflammation across the anterior  chest, possibly involving the costochondral cartilage and/or sternum  itself.  There was a small fluid collection in the right breast.  We  sent her for CT-guided needle aspiration of this fluid collection, and  cultures obtained from this fluid grew Pseudomonas, sensitive to  ciprofloxacin.  She had originally been started on oral Keflex, this was  switched to ciprofloxacin once we obtain the culture results.  Of note,  her white blood count was normal at 7000 when checked on August 03, 2010, prior to initiation of antibiotic therapy.  In addition, blood  cultures obtained have remained no growth on final review.  The patient  returns to the office today for followup.  She states that once she  started antibiotics,  her chest pain dramatically improved within 24  hours.  She has never had any associated fever.  She states that she  still has some swelling across her anterior chest, and she states that  now it seems as though the pain has sort of migrated more towards the  left side than the right.  She otherwise feels well and she specifically  reports no shortness of breath.  She has not had any cough.  Her  appetite is good.  She now has relatively minimal amount of pain and is  no longer requiring much in terms of pain medication.  We notified the  [facility] that she was started on oral ciprofloxacin, and  her prothrombin time/INR remains stable at 2.8 when last  checked.   PHYSICAL EXAMINATION:  Notable for well-appearing female with blood  pressure 105/71, pulse 90, and oxygen saturation 98%.  She is afebrile.  Auscultation of the chest reveals clear breath sounds that are  symmetrical bilaterally.  On physical exam, she has mild swelling and  tenderness across her anterior chest and sternum, but this is  dramatically improved in comparison with her exam last week.  There is  no fluctuance to suggest any sort of fluid collection on palpation.  Cardiovascular exam is notable for regular rate and rhythm with  mechanical heart sounds noted.  No murmurs, rubs, or gallops are  appreciated.  All of her surgical scars remain well healed.  The  remainder of her physical exam is noncontributory.   IMPRESSION:  The patient appears to have developed soft tissue  cellulitis involving her anterior chest wall more than 2 months  following her original surgical procedure and well after all of her  incision had healed.  Needle aspiration culture from a small fluid  collection in the right breast grew Pseudomonas aeruginosa sensitive to  ciprofloxacin.  She is markedly improved on antibiotic therapy.   PLAN:  We will plan to keep the patient on oral antibiotics for at least  6 weeks.  We will continue to follow her closely here in the office and  ultimately she will need followup CT scan.  We will plan to see her back  in 2 weeks for her next office visit.  She has been instructed to call  and return sooner should she develop any increased pain, swelling,  fevers, or other significant change in symptomatology.   Salvatore Decent. [name], M.D.  Electronically Signed   CHO/MEDQ  D:  08/11/2010  T:  08/12/2010  Job:  [id number]   cc:   Marca Ancona, MD  Lurena Nida, MD

## 2011-02-23 NOTE — Assessment & Plan Note (Signed)
OFFICE VISIT   Katie [name], Katie [name]  DOB:  03/15/1966                                        October 26, 2010  CHART #:  [id number]   HISTORY OF PRESENT ILLNESS:  Katie [name] returns to the office today for  further followup of her chest wall infection and osteomyelitis.  She was  last seen here in the office on September 14, 2010.  Since then she has  done well.  She completed her course of oral antibiotics and has now  been off  antibiotics for approximately a month.  She has not had any  fevers or chills.  She has not had any night sweats.  She has not had  any further pain at all in her chest.  She is back at work and has been  doing well clinically except over the fact that about 2 weeks ago she  developed severe diarrhea which later up for several days.  This is  improved although she still having some intermittent loose bloody stool.  She apparently was seen by her primary care physician, has been referred  to a gastroenterologist due to the possibility of underlying  pancreatitis.  To her knowledge she has not had her stool checked for C.  difficile colitis.  Overall she is still doing remarkably well under the  circumstances and is back at work and getting along fine.  She denies  any sensation or clicking or motion of the sternum.  She is not having  any pain in her chest at all.  She denies any shortness of breath.  She  is not having any tachy palpitations.   PHYSICAL EXAMINATION:  GENERAL:  Notable for well-appearing female.  VITAL SIGNS:  Blood pressure 100/72, pulse 93 and regular.  CHEST:  Her mini-thoracotomy scar has healed completely.  There is  actually no fluctuance or tenderness across her upper chest as had been  previously.  There is no tenderness on palpation of the sternum and the  sternal-manubrial junction.  There is no sign of any instability in this  region.  Auscultation of the chest reveals clear breath sounds.  CARDIOVASCULAR:   Demonstrates regular rate and rhythm.  Mechanical heart  sounds are noted.  No murmurs, rubs, or gallops are noted.  ABDOMEN:  Soft, nondistended, nontender.  Bowel sounds are present.  EXTREMITIES:  Warm and well perfused.  There is no lower extremity  edema.   DIAGNOSTIC TEST:  Chest CT scan performed today at the [facility] which is reviewed.  There is no longer any sign of soft  tissue infection in the breast or anterior chest wall.  There is no  longer any definitive sign of osteomyelitis, although there is some  slight backward displacement of the manubrium at the sternal-manubrial  junction.  This is actually slightly more prominent than CT scan  performed last October, but I suspect this is primarily related to the  healing of osteomyelitis involving the upper most portion of the body of  the sternum.  There is no longer any soft tissue swelling or edema in  this region.  There is nothing to suggest ongoing continued infection.   IMPRESSION:  Resolved soft tissue infection of the anterior chest wall,  breast, and sternum associated with sternal osteomyelitis.  At this  point, there is nothing to  suggest any continued ongoing infection and  Katie [name] is doing well clinically and now having been off antibiotics for  approximately 1 month.  She completed a 3-month course of oral  ciprofloxacin.  She no longer has any swelling or tenderness involving  the anterior chestwall or sternum.  She has been having bloody diarrhea  recently.  Given her prolonged course of antibiotics, I think it would  be reasonable to consider the possibility of clostridium difficile  colitis.  We do not have access to what type of tests have been  performed by her primary care physician, but Jazzman has not had her  stool checked.   PLAN:  We will check her stool for C. difficile colitis.  Otherwise  Vicky seems to be doing well.  We will plan to see her back in 6  months' time for further  followup.  She will call or return sooner  should she develop any recurrence of increased pain or swelling along  her anterior chest wall.   Salvatore Decent. [name], M.D.  Electronically Signed   CHO/MEDQ  D:  10/26/2010  T:  10/27/2010  Job:  [id number]   cc:   Marca Ancona, MD  Lurena Nida, MD

## 2011-02-23 NOTE — Discharge Summary (Signed)
NAMEROSAMUND, NYLAND NO.:  [id number]   MEDICAL RECORD NO.:  [id number]          PATIENT TYPE:  INP   LOCATION:  9304                          FACILITY:  [facility]   PHYSICIAN:  Janine Limbo, M.D.DATE OF BIRTH:  12/31/1965   DATE OF ADMISSION:  03/28/2009  DATE OF DISCHARGE:  04/02/2009                               DISCHARGE SUMMARY   Katie [name] is a 45 year old gravida 3, now para 0-2-1-1, who was  admitted for the delivery of her son, Katie [name].  He was born on June 9 by  cesarean, weighing 4 pounds 6 ounces and having Apgars of 7 at 1 minute  and 9 at 5 minutes.  This was a preterm delivery at 34 weeks and 2 days.  She had a spontaneous rupture of membranes at 34 weeks and 1 day, had 2  days of Pitocin with failure to progress and was sectioned on the  evening of the second day.  Admit date March 28, 2009, in the morning.  Delivery date June 19 in the evening.  Discharge date June 23 on postop  day #4.   ADMISSION DIAGNOSES:  Single intrauterine pregnancy at 34.[redacted] weeks  gestation, advanced maternal age, prolonged premature rupture of  membranes, pulmonary fibrosis, polymyositis, lupus, Raynaud syndrome,  herpes simplex virus 2, gastroesophageal reflux disease, history of  positive group B streptococcus previous pregnancy, proteinuria,  hematuria, renal failure, fibroid uterus, thrombocytopenia, steroid  dependent.   DISCHARGE DIAGNOSES:  Stable post primary cesarean and bilateral tubal  ligation day 4, advanced maternal age, prolonged premature rupture of  membranes, pulmonary fibrosis, polymyositis, lupus, Raynaud syndrome,  herpes simplex virus 2, gastroesophageal reflux disease, history of  positive group B streptococcus previous pregnancy, proteinuria,  hematuria, renal failure, fibroid uterus, anemia, steroid dependent,  oxygen dependent.   PERTINENT LABORATORY FINDINGS:  Katie [name] has an AB+ blood type and  negative antibody screen and is rubella  immune.  She had beta strep,  gonorrhea and chlamydia cultures on June 18, which were also negative.  She had a urine culture on June 20, which was also negative.  She had  labs every day that she was in the hospital and I will not recite them  all here nor will I recite the labs that were followed by the  specialists for all her many medical conditions.  However, I will  provide some basic data from (June 22 followed by June 18).  WBC 9.3  (2.7), HGB 9.6 (12.5), HCT 27.8 (36.5), PLT 128 (117), sodium 138 (135),  potassium 4.9 (4.4).   PROCEDURES:  Katie [name] received Pitocin augmentation for 2 days with  failure to progress.  She had epidural x2, one in labor and then it was  replaced in the operating room due to an unsatisfactory block.  She had  a primary low transverse cesarean section and a bilateral tubal  ligation.  She had specimens of the fallopian tubes and the placenta  sent to Pathology for analysis.  She had serial labs for 5 days.  She  had arterial blood gases and breathing treatments.  She had  a chest x-  ray and an echocardiogram.  She had consults per specialists including  Maternal Fetal Medicine, Nephrology, rheumatology and I believe  Pulmonology.   PHYSICAL EXAMINATION:  On day of discharge.  Vital signs, temperature  98.3, pulse 104, respiratory rate 20, blood pressure 98/68, O2  saturation 88% on room air, 100% on 2 L of oxygen.  Her lungs were with  diminished breath sounds bilaterally, although they sounded clear.  Her  breasts were soft as she is bottle feeding and had a bra on.  She had a  scleroderma facies with the drooping on the right side below the mouth.  Her abdomen was with minimal distention.  The fundus was firm and below  the umbilicus.  The incision was clean, dry and intact.  There was light  lochia.  There was +3 pitting edema of the bilateral lower extremities.  Normal deep tendon reflexes.  Negative Homans sign x2.  There was some  healing  lesions noted in the lower legs about 6 inches up from the  ankles.  The patient states she had run into the furniture and that her  skin is very fragile.   Katie [name] was provided with the [facility]  discharge brochure which was reviewed with her specifically emphasizing  the danger signs of the postoperative and the postpartum period.   DISCHARGE MEDICATIONS:  1. Prenatal vitamin.  2. Integra F iron supplement.  3. Percocet.  4. Motrin 600 mg.  5. Oxygen 2 L per nasal cannula arranged with Home Health.  6. Steroid management per Rheumatology and other specialties as well      as other medications that I am not aware of what they were.  Ms.      [name] agreed to follow up in the office at 6 weeks postpartum or      sooner p.r.n. problems, and she had appointments within a week with      her nephrologist and her rheumatologist.  She is deemed to have      received full benefit for the hospitalization for the delivery of      her son Katie [name] via cesarean section at 34 weeks and 2 days.      Eulogio Bear, CNM      Janine Limbo, M.D.  Electronically Signed    JM/MEDQ  D:  04/04/2009  T:  04/05/2009  Job:  [id number]

## 2011-03-09 ENCOUNTER — Ambulatory Visit (INDEPENDENT_AMBULATORY_CARE_PROVIDER_SITE_OTHER): Payer: BC Managed Care – PPO | Admitting: *Deleted

## 2011-03-09 DIAGNOSIS — I059 Rheumatic mitral valve disease, unspecified: Secondary | ICD-10-CM

## 2011-03-10 ENCOUNTER — Other Ambulatory Visit: Payer: Self-pay | Admitting: Cardiology

## 2011-03-11 ENCOUNTER — Encounter: Payer: Self-pay | Admitting: Infectious Disease

## 2011-03-19 ENCOUNTER — Encounter: Payer: Self-pay | Admitting: Cardiology

## 2011-03-19 ENCOUNTER — Ambulatory Visit (INDEPENDENT_AMBULATORY_CARE_PROVIDER_SITE_OTHER): Payer: BC Managed Care – PPO | Admitting: *Deleted

## 2011-03-19 DIAGNOSIS — I059 Rheumatic mitral valve disease, unspecified: Secondary | ICD-10-CM

## 2011-03-19 LAB — POCT INR: INR: 2.5

## 2011-03-22 ENCOUNTER — Encounter: Payer: Self-pay | Admitting: Cardiology

## 2011-03-23 ENCOUNTER — Ambulatory Visit (INDEPENDENT_AMBULATORY_CARE_PROVIDER_SITE_OTHER): Payer: BC Managed Care – PPO | Admitting: Cardiology

## 2011-03-23 ENCOUNTER — Encounter: Payer: Self-pay | Admitting: Cardiology

## 2011-03-23 VITALS — BP 101/74 | HR 87 | Ht 64.0 in | Wt 165.0 lb

## 2011-03-23 DIAGNOSIS — I050 Rheumatic mitral stenosis: Secondary | ICD-10-CM

## 2011-03-23 DIAGNOSIS — I2789 Other specified pulmonary heart diseases: Secondary | ICD-10-CM

## 2011-03-23 DIAGNOSIS — I059 Rheumatic mitral valve disease, unspecified: Secondary | ICD-10-CM

## 2011-03-23 MED ORDER — METOPROLOL SUCCINATE ER 25 MG PO TB24: ORAL_TABLET | ORAL | Status: DC

## 2011-03-23 MED ORDER — WARFARIN SODIUM 5 MG PO TABS: ORAL_TABLET | ORAL | Status: AC

## 2011-03-23 NOTE — Assessment & Plan Note (Addendum)
Pulmonary HTN is likely a mixed picture, from mitral valve disease as well as mixed connective tissue disease.  She had mild residual pulmonary HTN with moderately increased PVR even after mitral valve surgery on last RHC (though this is improved compared to initial RHC).  She is doing well on Revatio 20 mg three times a day.  She did quite well on 6 minute walk at last appointment (427 meters).  Continue current therapy.   Followup in 6 months as long as no issues arise.

## 2011-03-23 NOTE — Assessment & Plan Note (Signed)
Status post mitral valve replacement, stable from valve standpoint.  She is on coumadin goal INR 2.5 - 3.5 and ASA 81 mg daily.

## 2011-03-23 NOTE — Progress Notes (Signed)
PCP: Dr. 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  She was found to have a Pseudomonas breast abscess with involvement of the sternum (osteomyelitis) last fall and was treated with antibiotics with resolution.  She also developed severe diarrhea in 1/12 that was found to be C. difficile.  She was treated with a course of Flagyl.    Lately, she has been doing quite well symptomatically.  She is able to walk without any shortness of breath on flat ground and up a flight of steps.  She can lift and carry her son.  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  Weight is stable.  She has had some trouble keeping her INR therapeutic.   Labs (8/11): HCT 27 => 30, creatinine 0.98, INR 4 Labs (9/11): K 4.6, creatinine 0.9, HCT 32.7, BNP 214=>146, LFTs normal, TSH normal Labs (1/12): HCT 37.9, K 3.5, creatinine 0.9 Labs (4/12): LDL 108, HDL 43, TSH normal, K 4.1, creatinine 0.8, HCT 37.9  Allergies (verified):  1)  ! Methotrexate  Past Medical History: 1. Mixed connective tissue disease: diagnosis 1990. 8/10 ANA positive, anti-dsDNA positive, anti-SCL70 negative 2. 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 CHF: Secondary to moderate mitral stenosis and moderate mitral regurgitation in the setting of rheumatic mitral valve disease as well as severe pulmonary HTN likely due to a combination of MV disease and pulmonary arterial HTN in the setting of collagen vascular disease.   Not valvuloplasty candidate due to MR.  Patient had MV  replacement with mechanical MV in 8/11.  Echo (8/11) post-op showed normally functioning mechanical MV, EF 60-65% with septal bounce, small mobile structure in the mid ventricle (? loose papillary muscle), RV-RA gradient 22 mmHg.  Echo (10/11): EF 60-65%, normally functioning mechanical mitral valve.  6.  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  CTA chest (7/10) with no evidence for pulmonary emboli or chronic pulmonary emboli.  PA pressure 72/28 mean 50 with PVR 5.7 WU on initial RHC, PA pressure 45/21 mean 30 PVR 2.3 WU on f/u RHC on Revatio 80 mg three times a day (1/11).  After MV surgery, Revatio stopped.  Increased dyspnea.  Repeat RHC with mean RA 6, PA 42/20 (mean 29), PVR 4.8 WU, CI 2.3.  Revatio 20 mg three times a day restarted.  - 3/11 6 min walk 427 m - 2/12 6 min walk 427 m 7.  Rheumatic fever 8.  LHC (9/10): no angiographic CAD.  9.  Warfarin anticoagulation for mechanical MV, goal INR 2.5-3.5 10.  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  11. 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 C difficile diarrhea 1/12  Family History: Emphysema mother, smoker.  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  Lives with father of child.   Current Outpatient Prescriptions  Medication Sig Dispense Refill  . acetaminophen (TYLENOL)  325 MG tablet Take 650 mg by mouth. As directed as needed       . aspirin 81 MG EC tablet Take 81 mg by mouth daily.        . furosemide (LASIX) 40 MG tablet Take 20 mg by mouth daily.       . hydroxychloroquine (PLAQUENIL) 200 MG tablet Take 2 tablets daily      . metoprolol succinate (TOPROL-XL) 25 MG 24 hr tablet Take 1/2 tab daily or as directed  90 tablet  3  . Multiple Vitamin (MULTIVITAMIN) tablet Take 1 tablet by mouth daily.        Marland Kitchen  omeprazole (PRILOSEC) 20 MG capsule Take 20 mg by mouth daily.        . predniSONE (DELTASONE) 1 MG tablet Take 1 mg by mouth. Take 3 tabs once daily       . predniSONE (DELTASONE) 5 MG tablet Take 5 mg by mouth daily.        . sildenafil (REVATIO) 20 MG tablet Take 20 mg by mouth 3 (three) times daily.        Marland Kitchen warfarin (COUMADIN) 5 MG tablet Take as directed by the anticoagulation clinic.  135 tablet  3  . DISCONTD: metoprolol succinate (TOPROL-XL) 25 MG 24 hr tablet Take 25 mg by mouth. Take 1/2 tab daily       . DISCONTD: warfarin (COUMADIN) 5 MG tablet Take as directed by the anticoagulation clinic.  50 tablet  3  . DISCONTD: enoxaparin (LOVENOX) 80 MG/0.8ML SOLN Inject into the skin every 12 (twelve) hours. One syringe every 12 hours as directed         BP 101/74  Pulse 87  Ht 5\' 4"  (1.626 m)  Wt 165 lb (74.844 kg)  BMI 28.32 kg/m2 General:  Well developed, well nourished, in no acute distress. Neck:  Neck supple, no JVD. No masses, thyromegaly or abnormal cervical nodes. Lungs:  Clear bilaterally to auscultation and percussion. Heart:  Non-displaced PMI,  regular rate and rhythm, S1, S2. mechanical S1.  Carotid upstroke normal, no bruit. Pedals normal pulses. No edema.  Abdomen:  Bowel sounds positive; abdomen soft and non-tender without masses, organomegaly, or hernias noted. No hepatosplenomegaly. Extremities:  No clubbing or cyanosis. Neurologic:  Alert and oriented x 3. Psych:  Normal affect.

## 2011-03-23 NOTE — Patient Instructions (Signed)
Your physician wants you to follow-up in: 6 months with Dr McLean. ( December 2012) You will receive a reminder letter in the mail two months in advance. If you don't receive a letter, please call our office to schedule the follow-up appointment.  

## 2011-04-01 NOTE — Progress Notes (Deleted)
  Subjective:    Patient ID: Katie [name], female    DOB: September 13, 1966, 45 y.o.   MRN: [id number]  HPI    Review of Systems     Objective:   Physical Exam        Assessment & Plan:   This encounter was created in error - please disregard.

## 2011-04-05 ENCOUNTER — Encounter: Payer: Self-pay | Admitting: Thoracic Surgery (Cardiothoracic Vascular Surgery)

## 2011-04-05 ENCOUNTER — Encounter (INDEPENDENT_AMBULATORY_CARE_PROVIDER_SITE_OTHER): Payer: BC Managed Care – PPO | Admitting: Thoracic Surgery (Cardiothoracic Vascular Surgery)

## 2011-04-05 DIAGNOSIS — I059 Rheumatic mitral valve disease, unspecified: Secondary | ICD-10-CM

## 2011-04-05 NOTE — Progress Notes (Signed)
Patient returns for routine followup status post right miniature thoracotomy for mitral valve replacement with closure of patent foramen ovale on 05/13/2010. Katie [name] has done very well over the past 6 months. She reports no shortness of breath. She states that her activity level is quite good and she does not get short of breath with any sort of physical exertion. The pain she had experienced previously in the right upper chest and sternum completely resolved 6 months ago. She does report a persistent dry nonproductive cough. She otherwise feels quite well. She has had some problems bringing her Coumadin levels under therapeutic control. However, this has improved recently. She has no residual pain in her chest whatsoever. She has no complaints.  On physical exam the patient looks quite good. The surgical incisions healed completely. Breath sounds are clear to auscultation and symmetrical bilaterally. Cardiovascular sounds are crisp with expected mechanical heart sounds. Her abdomen is soft and nondistended. There is no lower extremity edema.  In the future the patient will call and return to see [place] you as needed.

## 2011-04-09 ENCOUNTER — Ambulatory Visit (INDEPENDENT_AMBULATORY_CARE_PROVIDER_SITE_OTHER): Payer: BC Managed Care – PPO | Admitting: *Deleted

## 2011-04-09 DIAGNOSIS — I059 Rheumatic mitral valve disease, unspecified: Secondary | ICD-10-CM

## 2011-04-09 LAB — POCT INR: INR: 3.2

## 2011-04-19 ENCOUNTER — Encounter: Payer: Self-pay | Admitting: Thoracic Surgery (Cardiothoracic Vascular Surgery)

## 2011-04-22 IMAGING — CR DG CHEST 2V
2 series · 2 of 2 positions shown · non-contrast
Comparison: 05/29/2010

CLINICAL DATA: Pleural effusion.  Congestive heart failure.

CHEST - 2 VIEW

[w chest pa]
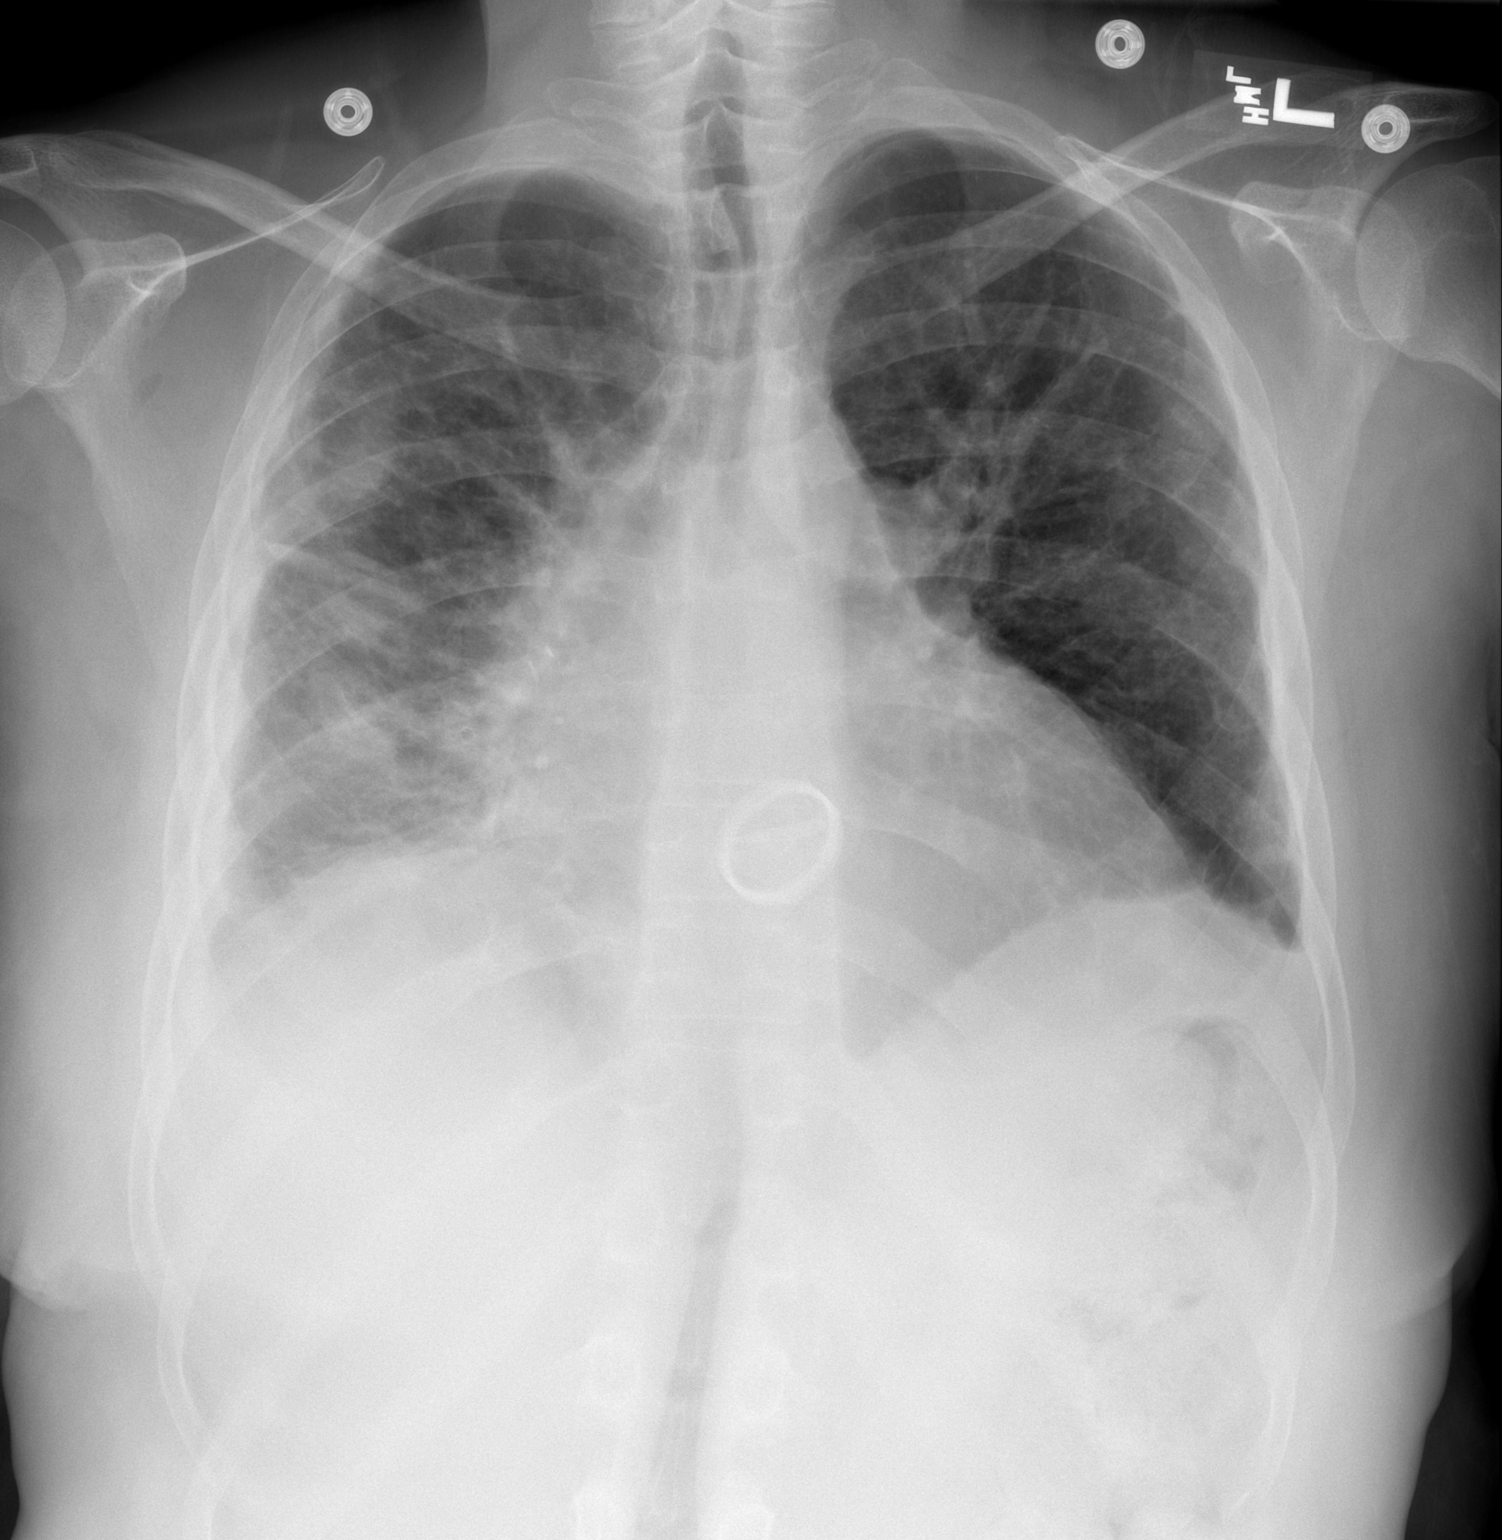

[w chest lat]
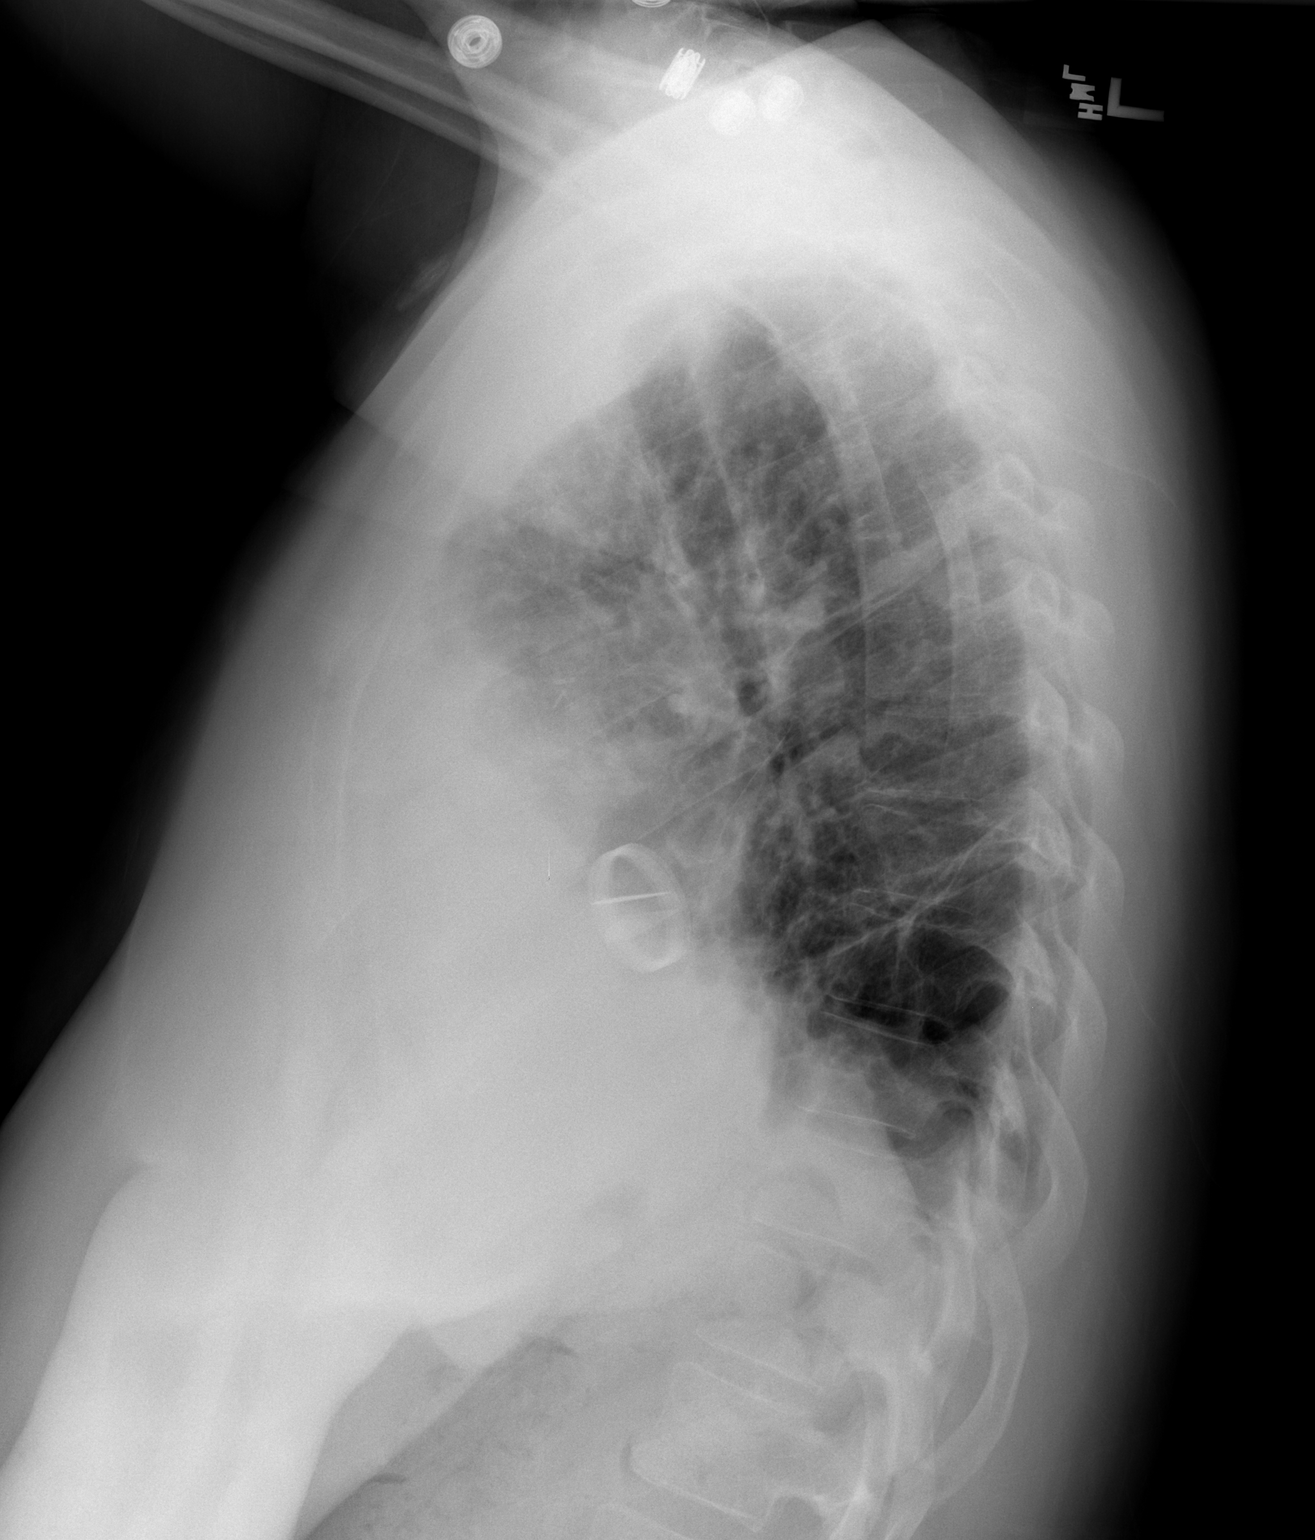

[2 of 2 positions shown; findings below may reference images not displayed]

FINDINGS: Motion artifact noted on the frontal projection.

Prosthetic mitral valve noted.  Right greater than left pleural
effusions noted with minimal improved pulmonary edema.  Vague
densities projecting over the right lung are probably a
manifestation of the pleural effusion although asymmetric edema or
pneumonia could have a similar appearance.  Cardiomegaly persists.

Remote left rib deformities noted.
IMPRESSION: 1.  Mildly reduced edema.
2.  Stable pleural effusions and cardiomegaly.

## 2011-04-22 IMAGING — CR DG CHEST 1V PORT
1 series · 1 of 1 positions shown · non-contrast
Comparison: 05/30/2010

CLINICAL DATA: Congestive heart failure.  Pericardial effusion.
Postop from pericardial window.

PORTABLE CHEST - 1 VIEW

[AP]
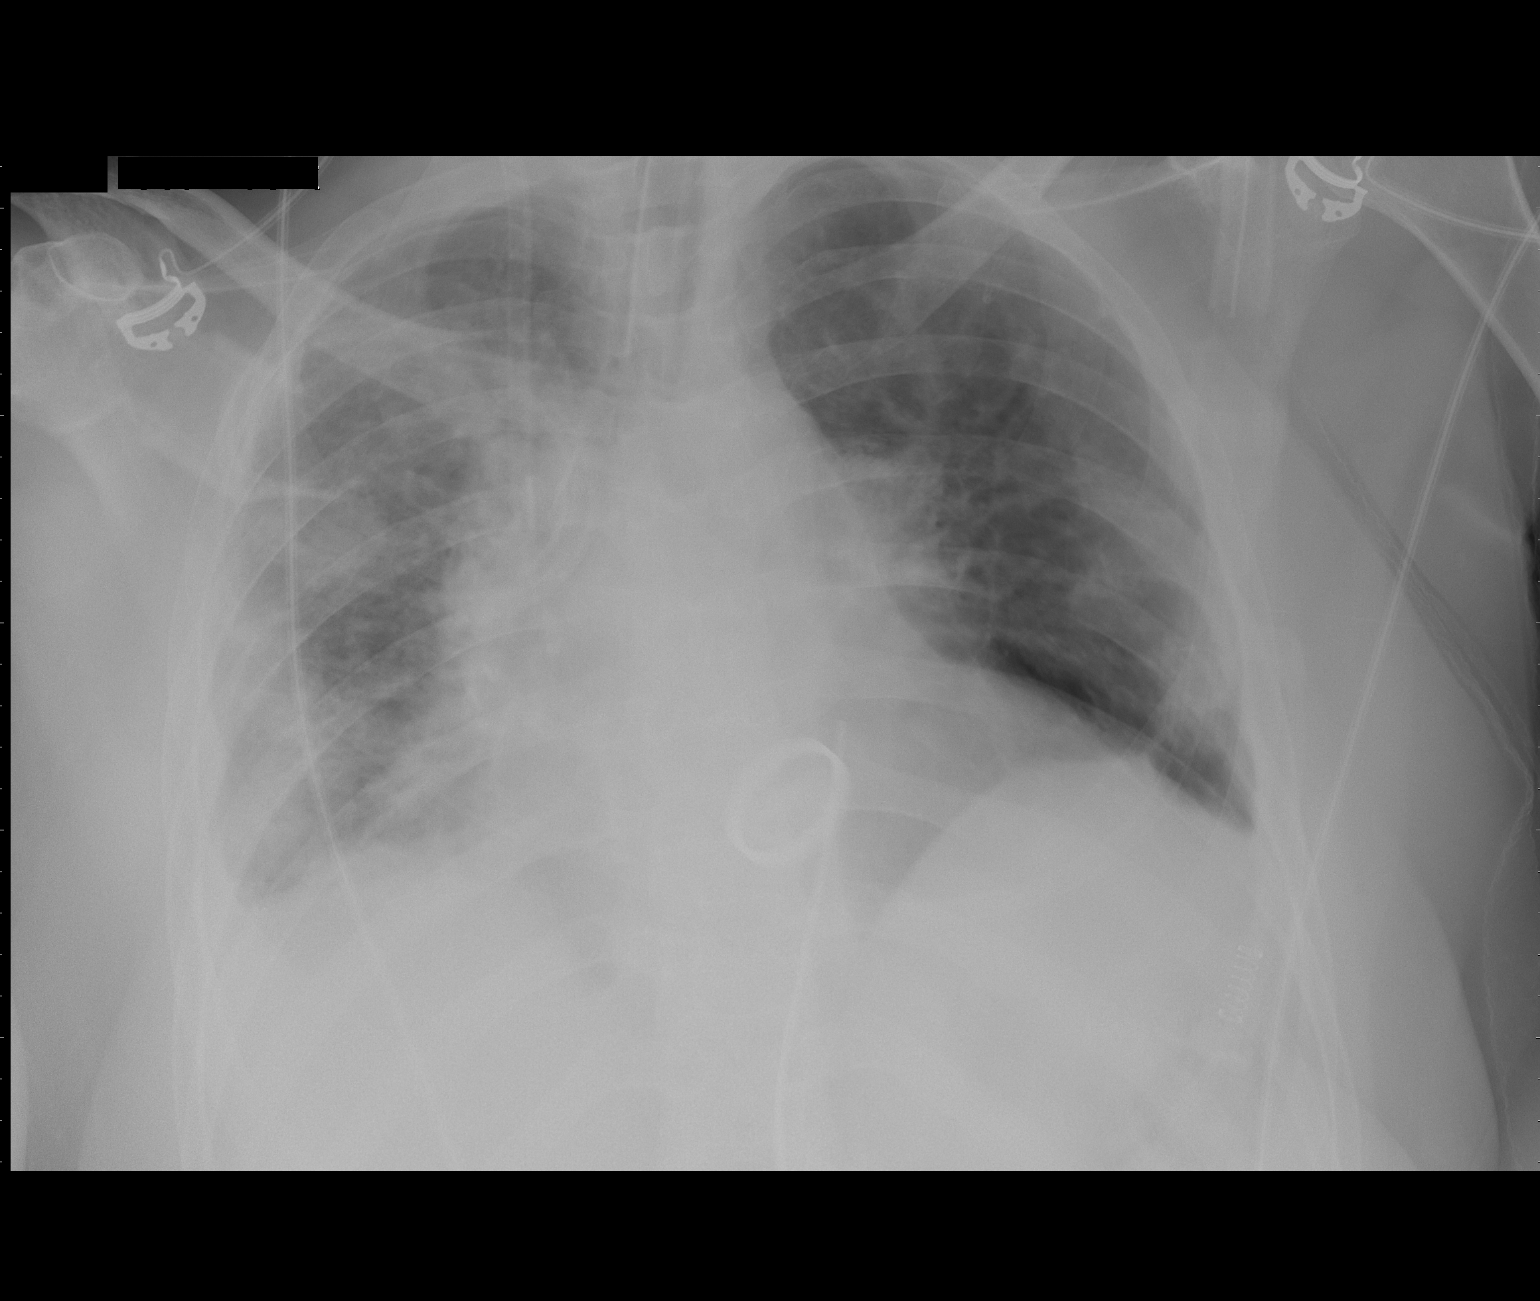

[1 of 1 positions shown; findings below may reference images not displayed]

FINDINGS: The endotracheal tube is seen in appropriate position.
Right jugular center venous catheter tip is in the SVC.
Pericardial drain is now seen in place.  Prosthetic heart valve
again noted.  No evidence of pneumothorax.

Cardiomegaly stable.  Low lung volumes are again seen.  Perform
areas of atelectasis or infiltrate are stable as well small right
pleural effusion.
IMPRESSION: Support lines and tubes in appropriate position.  No evidence of
pneumothorax.  No other significant interval change.

## 2011-04-22 IMAGING — CR DG CHEST DECUBITUS*R*
2 series · 2 of 2 positions shown · non-contrast
Comparison: Chest x-ray 05/30/2010.

CLINICAL DATA: Evaluate pleural effusion.

CHEST - RIGHT DECUBITUS

[view not recorded (1 of 2)]
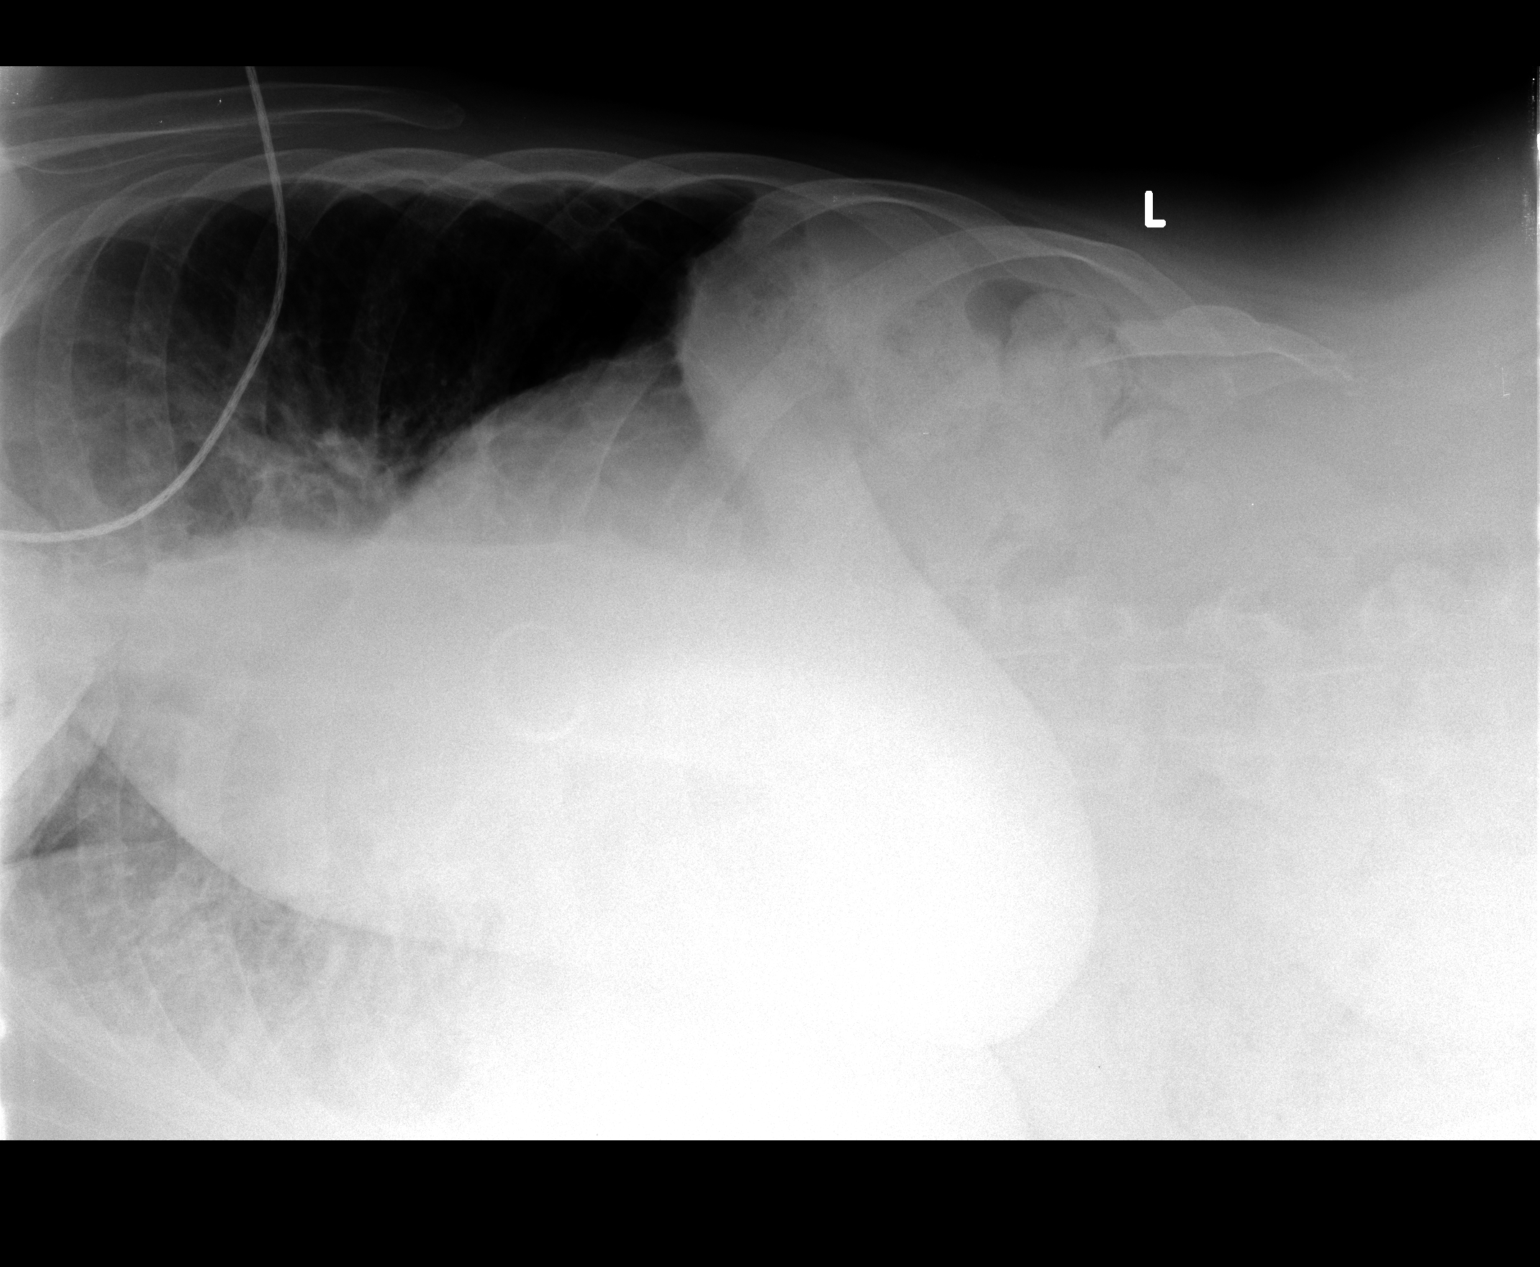

[view not recorded (2 of 2)]
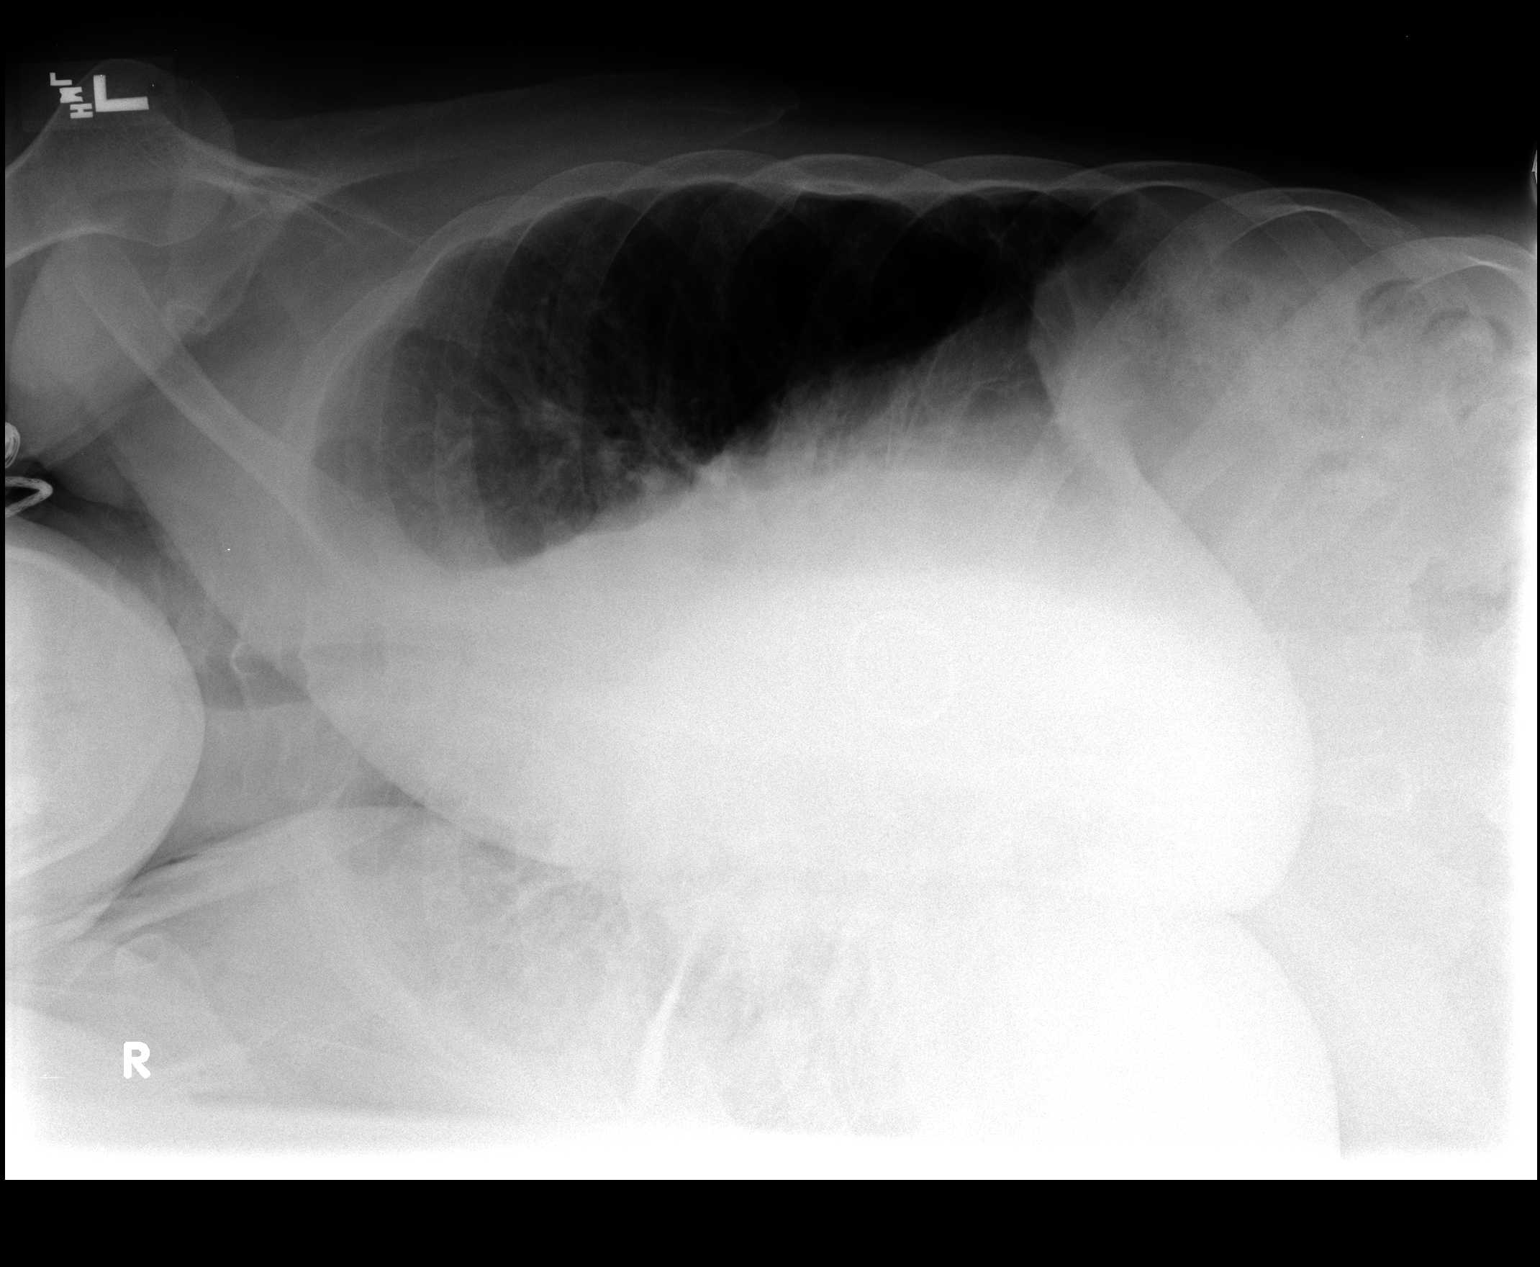

[2 of 2 positions shown; findings below may reference images not displayed]

FINDINGS: Right-sided pleural thickening without definite free-
flowing pleural effusion.
IMPRESSION: No obvious free flowing right pleural effusion.

## 2011-05-07 ENCOUNTER — Ambulatory Visit (INDEPENDENT_AMBULATORY_CARE_PROVIDER_SITE_OTHER): Payer: BC Managed Care – PPO | Admitting: *Deleted

## 2011-05-07 DIAGNOSIS — I059 Rheumatic mitral valve disease, unspecified: Secondary | ICD-10-CM

## 2011-05-14 ENCOUNTER — Other Ambulatory Visit: Payer: Self-pay | Admitting: Gastroenterology

## 2011-05-14 DIAGNOSIS — D1803 Hemangioma of intra-abdominal structures: Secondary | ICD-10-CM

## 2011-05-21 ENCOUNTER — Ambulatory Visit (INDEPENDENT_AMBULATORY_CARE_PROVIDER_SITE_OTHER): Payer: BC Managed Care – PPO | Admitting: *Deleted

## 2011-05-21 DIAGNOSIS — I059 Rheumatic mitral valve disease, unspecified: Secondary | ICD-10-CM

## 2011-05-24 ENCOUNTER — Ambulatory Visit
Admission: RE | Admit: 2011-05-24 | Discharge: 2011-05-24 | Disposition: A | Discharge status: Home / Self Care | Payer: BC Managed Care – PPO | Source: Ambulatory Visit | Attending: Gastroenterology | Admitting: Gastroenterology

## 2011-05-24 DIAGNOSIS — D1803 Hemangioma of intra-abdominal structures: Secondary | ICD-10-CM

## 2011-06-04 ENCOUNTER — Ambulatory Visit (INDEPENDENT_AMBULATORY_CARE_PROVIDER_SITE_OTHER): Payer: BC Managed Care – PPO | Admitting: *Deleted

## 2011-06-04 DIAGNOSIS — I059 Rheumatic mitral valve disease, unspecified: Secondary | ICD-10-CM

## 2011-06-21 ENCOUNTER — Encounter: Payer: BC Managed Care – PPO | Admitting: *Deleted

## 2011-06-28 ENCOUNTER — Telehealth: Payer: Self-pay | Admitting: Cardiology

## 2011-06-28 MED ORDER — SILDENAFIL CITRATE 20 MG PO TABS: 20.0000 mg | ORAL_TABLET | Freq: Three times a day (TID) | ORAL | Status: DC

## 2011-06-28 NOTE — Telephone Encounter (Signed)
escribe medication per fax request  

## 2011-06-28 NOTE — Telephone Encounter (Signed)
Pt called and stated that her Revatio needs to be renewed.  [employer] will need to be called for renewal request.  [phone], ID number [id number].  Please call patient when this is completed.  Aware that Thurston Hole is off today.

## 2011-07-09 ENCOUNTER — Ambulatory Visit (INDEPENDENT_AMBULATORY_CARE_PROVIDER_SITE_OTHER): Payer: BC Managed Care – PPO | Admitting: *Deleted

## 2011-07-09 DIAGNOSIS — I059 Rheumatic mitral valve disease, unspecified: Secondary | ICD-10-CM

## 2011-07-09 LAB — POCT INR: INR: 2.9

## 2011-07-30 ENCOUNTER — Ambulatory Visit (INDEPENDENT_AMBULATORY_CARE_PROVIDER_SITE_OTHER): Payer: BC Managed Care – PPO | Admitting: *Deleted

## 2011-07-30 DIAGNOSIS — I059 Rheumatic mitral valve disease, unspecified: Secondary | ICD-10-CM

## 2011-07-30 DIAGNOSIS — Z7901 Long term (current) use of anticoagulants: Secondary | ICD-10-CM

## 2011-07-30 LAB — POCT INR: INR: 4.4

## 2011-08-10 ENCOUNTER — Ambulatory Visit (INDEPENDENT_AMBULATORY_CARE_PROVIDER_SITE_OTHER): Payer: BC Managed Care – PPO | Admitting: *Deleted

## 2011-08-10 DIAGNOSIS — I059 Rheumatic mitral valve disease, unspecified: Secondary | ICD-10-CM

## 2011-08-10 DIAGNOSIS — Z7901 Long term (current) use of anticoagulants: Secondary | ICD-10-CM

## 2011-08-24 ENCOUNTER — Encounter: Payer: BC Managed Care – PPO | Admitting: *Deleted

## 2011-08-30 ENCOUNTER — Telehealth: Payer: Self-pay | Admitting: Cardiology

## 2011-08-30 NOTE — Telephone Encounter (Signed)
OK with Dr [name] to use generic revatio 20mg  tid. I talked with Sharyl Nimrod, pharmacist  at [facility]  [phone] and  told her generic OK per Dr [name].

## 2011-08-30 NOTE — Telephone Encounter (Signed)
New problem Pharmacy wants to change revatio to generic sildenafil citrate please let them know

## 2011-09-10 ENCOUNTER — Ambulatory Visit (INDEPENDENT_AMBULATORY_CARE_PROVIDER_SITE_OTHER): Payer: BC Managed Care – PPO | Admitting: *Deleted

## 2011-09-10 DIAGNOSIS — I059 Rheumatic mitral valve disease, unspecified: Secondary | ICD-10-CM

## 2011-09-10 DIAGNOSIS — Z7901 Long term (current) use of anticoagulants: Secondary | ICD-10-CM

## 2011-10-01 ENCOUNTER — Encounter: Payer: BC Managed Care – PPO | Admitting: *Deleted

## 2011-10-06 ENCOUNTER — Encounter: Payer: BC Managed Care – PPO | Admitting: *Deleted

## 2011-10-08 ENCOUNTER — Telehealth: Payer: Self-pay | Admitting: Cardiology

## 2011-10-08 NOTE — Telephone Encounter (Signed)
Pt calling re medication, revatio

## 2011-10-08 NOTE — Telephone Encounter (Signed)
Pt wants a new Rx for her Revatio however she has currently 3 months in hand.  Advised pt to talk with Dr. [name] at her 11/04/11 appointment.

## 2011-10-11 ENCOUNTER — Ambulatory Visit (INDEPENDENT_AMBULATORY_CARE_PROVIDER_SITE_OTHER): Payer: BC Managed Care – PPO | Admitting: *Deleted

## 2011-10-11 DIAGNOSIS — I059 Rheumatic mitral valve disease, unspecified: Secondary | ICD-10-CM

## 2011-10-11 DIAGNOSIS — Z7901 Long term (current) use of anticoagulants: Secondary | ICD-10-CM

## 2011-10-11 LAB — POCT INR: INR: 4.4

## 2011-10-20 ENCOUNTER — Ambulatory Visit (INDEPENDENT_AMBULATORY_CARE_PROVIDER_SITE_OTHER): Payer: BC Managed Care – PPO | Admitting: *Deleted

## 2011-10-20 DIAGNOSIS — I059 Rheumatic mitral valve disease, unspecified: Secondary | ICD-10-CM

## 2011-10-20 DIAGNOSIS — Z7901 Long term (current) use of anticoagulants: Secondary | ICD-10-CM

## 2011-10-20 LAB — POCT INR: INR: 1.8

## 2011-10-25 ENCOUNTER — Encounter: Payer: BC Managed Care – PPO | Admitting: *Deleted

## 2011-11-04 ENCOUNTER — Encounter: Payer: Self-pay | Admitting: Cardiology

## 2011-11-04 ENCOUNTER — Ambulatory Visit (INDEPENDENT_AMBULATORY_CARE_PROVIDER_SITE_OTHER): Payer: BC Managed Care – PPO | Admitting: *Deleted

## 2011-11-04 ENCOUNTER — Ambulatory Visit (INDEPENDENT_AMBULATORY_CARE_PROVIDER_SITE_OTHER): Payer: BC Managed Care – PPO | Admitting: Cardiology

## 2011-11-04 VITALS — BP 118/68 | HR 86 | Ht 64.5 in | Wt 178.0 lb

## 2011-11-04 DIAGNOSIS — R059 Cough, unspecified: Secondary | ICD-10-CM

## 2011-11-04 DIAGNOSIS — I272 Pulmonary hypertension, unspecified: Secondary | ICD-10-CM

## 2011-11-04 DIAGNOSIS — Z7901 Long term (current) use of anticoagulants: Secondary | ICD-10-CM

## 2011-11-04 DIAGNOSIS — R05 Cough: Secondary | ICD-10-CM | POA: Insufficient documentation

## 2011-11-04 DIAGNOSIS — I059 Rheumatic mitral valve disease, unspecified: Secondary | ICD-10-CM

## 2011-11-04 DIAGNOSIS — I2789 Other specified pulmonary heart diseases: Secondary | ICD-10-CM

## 2011-11-04 LAB — BRAIN NATRIURETIC PEPTIDE: Pro B Natriuretic peptide (BNP): 70 pg/mL (ref 0.0–100.0)

## 2011-11-04 MED ORDER — OMEPRAZOLE MAGNESIUM 20 MG PO TBEC: 20.0000 mg | DELAYED_RELEASE_TABLET | Freq: Every day | ORAL | Status: DC

## 2011-11-04 NOTE — Assessment & Plan Note (Signed)
Pulmonary HTN is likely a mixed picture, from mitral valve disease as well as mixed connective tissue disease.  She had mild residual pulmonary HTN with moderately increased PVR even after mitral valve surgery on last RHC (though this is improved compared to initial RHC).  She is doing well on Revatio 20 mg three times a day.  6 minute walk was stable today.  Continue current therapy.

## 2011-11-04 NOTE — Assessment & Plan Note (Signed)
Not associated with dyspnea.  Worse with lying down.  ? Related to GERD.  - CXR - Check BNP though euvolemic on exam.  - Trial of PPI: omeprazole.

## 2011-11-04 NOTE — Assessment & Plan Note (Signed)
Status post mitral valve replacement, stable from valve standpoint.  She is on coumadin goal INR 2.5 - 3.5 and ASA 81 mg daily.  

## 2011-11-04 NOTE — Progress Notes (Signed)
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    Lately, she has been doing quite well symptomatically.  She is able to walk without any shortness of breath on flat ground and up a flight of steps.  She can lift and carry her son.  She is back at work as a Surveyor, mining.    Main complaint lately has been a chronic cough.  This has been going on since the fall.  It is dry, hacking.  It seems to be worse when she lies down.  She does have a history of GERD.    6 minute walk today: 411 m  ECG: NSR, poor anterior R wave progression   Labs (8/11): HCT 27 => 30, creatinine 0.98, INR 4 Labs (9/11): K 4.6, creatinine 0.9, HCT 32.7, BNP 214=>146, LFTs normal, TSH normal Labs (1/12): HCT 37.9, K 3.5, creatinine 0.9 Labs (4/12): LDL 108, HDL 43, TSH normal, K 4.1, creatinine 0.8, HCT 37.9  Allergies (verified):  1)  ! Methotrexate  Past Medical History: 1. Mixed connective tissue disease: diagnosis 1990. 8/10 ANA positive, anti-dsDNA positive, anti-SCL70 negative 2. 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 CHF: Secondary to moderate mitral stenosis and moderate mitral regurgitation in the setting of rheumatic mitral valve disease as well as severe pulmonary HTN likely due to a combination of MV disease and pulmonary arterial HTN in the setting of collagen vascular disease.   Not valvuloplasty candidate due to MR.  Patient had MV replacement with mechanical MV in 8/11.  Echo (8/11) post-op showed normally functioning mechanical MV, EF 60-65% with septal  bounce, small mobile structure in the mid ventricle (? loose papillary muscle), RV-RA gradient 22 mmHg.  Echo (10/11): EF 60-65%, normally functioning mechanical mitral valve.  6.  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  CTA chest (7/10) with no evidence for pulmonary emboli or chronic pulmonary emboli.  PA pressure 72/28 mean 50 with PVR 5.7 WU on initial RHC, PA pressure 45/21 mean 30 PVR 2.3 WU on f/u RHC on Revatio 80 mg three times a day (1/11).  After MV surgery, Revatio stopped.  Increased dyspnea.  Repeat RHC with mean RA 6, PA 42/20 (mean 29), PVR 4.8 WU, CI 2.3.  Revatio 20 mg three times a day restarted.  - 3/11 6 min walk 427 m - 2/12 6 min walk 427 m - 1/13 6 min walk 411 m 7.  Rheumatic fever 8.  LHC (9/10): no angiographic CAD.  9.  Warfarin anticoagulation for mechanical MV, goal INR 2.5-3.5 10.  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  11. 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 C difficile diarrhea 1/12 13. GERD  Family History: Emphysema mother, smoker.  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  Lives with father of child.   ROS: All systems reviewed and negative except as per HPI.   Current Outpatient  Prescriptions  Medication Sig Dispense Refill  . acetaminophen (TYLENOL) 325 MG tablet Take 650 mg by mouth. As directed as needed       . aspirin 81 MG EC tablet Take 81 mg by mouth daily.        . furosemide (LASIX) 40 MG tablet Take 20 mg by mouth daily.       . hydroxychloroquine (PLAQUENIL) 200 MG tablet Take 2 tablets daily      . metoprolol succinate (TOPROL-XL) 25 MG 24 hr tablet Take 1/2 tab daily or as directed  90 tablet  3  . Multiple Vitamin (MULTIVITAMIN) tablet Take 1 tablet by mouth daily.        Marland Kitchen omeprazole (PRILOSEC) 20 MG  capsule Take 20 mg by mouth daily.        . predniSONE (DELTASONE) 1 MG tablet Take 1 mg by mouth. Take 3 tabs once daily       . predniSONE (DELTASONE) 5 MG tablet Take 5 mg by mouth daily.        . sildenafil (REVATIO) 20 MG tablet Take 1 tablet (20 mg total) by mouth 3 (three) times daily.  270 tablet  3  . warfarin (COUMADIN) 5 MG tablet Take as directed by the anticoagulation clinic.  135 tablet  3  . omeprazole (PRILOSEC OTC) 20 MG tablet Take 1 tablet (20 mg total) by mouth daily.  30 tablet  6    BP 118/68  Pulse 86  Ht 5' 4.5" (1.638 m)  Wt 80.74 kg (178 lb)  BMI 30.08 kg/m2 General:  Well developed, well nourished, in no acute distress. Neck:  Neck supple, no JVD. No masses, thyromegaly or abnormal cervical nodes. Lungs:  Clear bilaterally to auscultation and percussion. Heart:  Non-displaced PMI,  regular rate and rhythm, S1, S2. mechanical S1.  Carotid upstroke normal, no bruit. Pedals normal pulses. No edema.  Abdomen:  Bowel sounds positive; abdomen soft and non-tender without masses, organomegaly, or hernias noted. No hepatosplenomegaly. Extremities:  No clubbing or cyanosis. Neurologic:  Alert and oriented x 3. Psych:  Normal affect.

## 2011-11-04 NOTE — Patient Instructions (Addendum)
A chest x-ray takes a picture of the organs and structures inside the chest, including the heart, lungs, and blood vessels. This test can show several things, including, whether the heart is enlarges; whether fluid is building up in the lungs; and whether pacemaker / defibrillator leads are still in place. TODAY  Your physician recommends that you have  lab work today--BMET/BNP    Take prilosec 20mg  daily.  Your physician wants you to follow-up in: 6 months with Dr [name]. (July 2013). You will receive a reminder letter in the mail two months in advance. If you don't receive a letter, please call our office to schedule the follow-up appointment.

## 2011-11-05 ENCOUNTER — Ambulatory Visit (INDEPENDENT_AMBULATORY_CARE_PROVIDER_SITE_OTHER)
Admission: RE | Admit: 2011-11-05 | Discharge: 2011-11-05 | Disposition: A | Discharge status: Home / Self Care | Payer: BC Managed Care – PPO | Source: Ambulatory Visit | Attending: Cardiology | Admitting: Cardiology

## 2011-11-05 DIAGNOSIS — R05 Cough: Secondary | ICD-10-CM

## 2011-11-05 DIAGNOSIS — R059 Cough, unspecified: Secondary | ICD-10-CM

## 2011-11-05 LAB — BASIC METABOLIC PANEL
Chloride: 105 mEq/L (ref 96–112)
GFR: 102.76 mL/min (ref >60.00)
Potassium: 3.8 mEq/L (ref 3.5–5.1)
Sodium: 139 mEq/L (ref 135–145)

## 2011-11-08 ENCOUNTER — Telehealth: Payer: Self-pay | Admitting: Cardiology

## 2011-11-08 NOTE — Telephone Encounter (Signed)
Discussed recent test results with pt. 

## 2011-11-08 NOTE — Telephone Encounter (Signed)
FU Call: pt returning call from [place]. Please call back.

## 2011-11-12 ENCOUNTER — Other Ambulatory Visit: Payer: Self-pay | Admitting: Cardiology

## 2011-11-15 ENCOUNTER — Other Ambulatory Visit: Payer: Self-pay | Admitting: Obstetrics and Gynecology

## 2011-11-15 DIAGNOSIS — Z1231 Encounter for screening mammogram for malignant neoplasm of breast: Secondary | ICD-10-CM

## 2011-11-19 ENCOUNTER — Encounter: Payer: BC Managed Care – PPO | Admitting: *Deleted

## 2011-11-24 ENCOUNTER — Ambulatory Visit (INDEPENDENT_AMBULATORY_CARE_PROVIDER_SITE_OTHER): Payer: BC Managed Care – PPO | Admitting: *Deleted

## 2011-11-24 DIAGNOSIS — I059 Rheumatic mitral valve disease, unspecified: Secondary | ICD-10-CM

## 2011-11-24 DIAGNOSIS — Z7901 Long term (current) use of anticoagulants: Secondary | ICD-10-CM

## 2011-11-24 LAB — POCT INR: INR: 2.3

## 2011-11-26 ENCOUNTER — Ambulatory Visit
Admission: RE | Admit: 2011-11-26 | Discharge: 2011-11-26 | Disposition: A | Discharge status: Home / Self Care | Payer: BC Managed Care – PPO | Source: Ambulatory Visit | Attending: Obstetrics and Gynecology | Admitting: Obstetrics and Gynecology

## 2011-11-26 ENCOUNTER — Other Ambulatory Visit: Payer: Self-pay | Admitting: Obstetrics and Gynecology

## 2011-11-26 DIAGNOSIS — N63 Unspecified lump in unspecified breast: Secondary | ICD-10-CM

## 2011-11-26 DIAGNOSIS — Z1231 Encounter for screening mammogram for malignant neoplasm of breast: Secondary | ICD-10-CM

## 2011-12-08 ENCOUNTER — Other Ambulatory Visit: Payer: Self-pay | Admitting: Obstetrics and Gynecology

## 2011-12-08 ENCOUNTER — Ambulatory Visit
Admission: RE | Admit: 2011-12-08 | Discharge: 2011-12-08 | Disposition: A | Discharge status: Home / Self Care | Payer: BC Managed Care – PPO | Source: Ambulatory Visit | Attending: Obstetrics and Gynecology | Admitting: Obstetrics and Gynecology

## 2011-12-08 DIAGNOSIS — N63 Unspecified lump in unspecified breast: Secondary | ICD-10-CM

## 2011-12-10 ENCOUNTER — Ambulatory Visit (INDEPENDENT_AMBULATORY_CARE_PROVIDER_SITE_OTHER): Payer: BC Managed Care – PPO

## 2011-12-10 DIAGNOSIS — I059 Rheumatic mitral valve disease, unspecified: Secondary | ICD-10-CM

## 2011-12-10 DIAGNOSIS — Z7901 Long term (current) use of anticoagulants: Secondary | ICD-10-CM

## 2011-12-30 ENCOUNTER — Telehealth: Payer: Self-pay | Admitting: Cardiology

## 2011-12-30 NOTE — Telephone Encounter (Signed)
[employer] called and said that a prior Berkley Harvey is needed for revatio for assistance program. Please call

## 2011-12-30 NOTE — Telephone Encounter (Signed)
Called [employer] for prior authorization INFORMATION ON REVATIO 20 MG. PATIENT TO TAKE ONE TABLET 3 TIMES A DAY. THE PHARMACY I SPOKE WITH STATES that  PT HAS MEDICATION AVILA BE TRUE NOV EMBER 2013. Then she asked if we have done anything for prior authorization, pharmacist was made aware that I was calling because [employer] has call for prior authorization for this medication. At the end pharmacist found out that pt was waiting to received  A  RSVP card.  The will get the medication.

## 2011-12-31 ENCOUNTER — Ambulatory Visit (INDEPENDENT_AMBULATORY_CARE_PROVIDER_SITE_OTHER): Payer: BC Managed Care – PPO | Admitting: *Deleted

## 2011-12-31 DIAGNOSIS — I059 Rheumatic mitral valve disease, unspecified: Secondary | ICD-10-CM

## 2011-12-31 DIAGNOSIS — Z7901 Long term (current) use of anticoagulants: Secondary | ICD-10-CM

## 2012-01-03 ENCOUNTER — Telehealth: Payer: Self-pay | Admitting: Cardiology

## 2012-01-03 NOTE — Telephone Encounter (Signed)
New Msg: Pt returning call to [facility]. Please return pt call to discuss further.

## 2012-01-03 NOTE — Telephone Encounter (Signed)
Pt rtn call to anne, will call back tomorrow

## 2012-01-03 NOTE — Telephone Encounter (Signed)
LMTCB

## 2012-01-03 NOTE — Telephone Encounter (Signed)
I had called pt to be sure she had been able to get her Revatio. Dr [name] had submitted the form for the renewal for this.  Attempted to contact pt and got her voice mail.

## 2012-01-04 ENCOUNTER — Telehealth: Payer: Self-pay | Admitting: Cardiology

## 2012-01-04 NOTE — Telephone Encounter (Signed)
Pt calling back to talk to a nurse re arm pain, denises any other symptoms, wonders if she should go to er, pls call

## 2012-01-04 NOTE — Telephone Encounter (Signed)
Follow-up:    Patient called in saying that she was ok with her sildenafil (REVATIO) 20 MG tablet until her recommended refill.  Please call back only if you have any questions. Patient understands that you were off today.

## 2012-01-04 NOTE — Telephone Encounter (Signed)
Patient states that for the last 2 weeks she has not been able to raise her left arm all the wait up except 1/2 way. Also she is having pain on left shoulder closed to the arm pit and  has a bruise on the outer part on her left breast. Patient denies SOB,chest pain nor   nauseas,or cold sweats. Patient was made aware that these symptoms  Not seem to be cardiac related. She is to call and make an appointment with her PCP to evaluate these symptoms, patient verbalized understanding.

## 2012-01-05 NOTE — Telephone Encounter (Signed)
See phone note 01/04/12

## 2012-01-08 ENCOUNTER — Other Ambulatory Visit: Payer: Self-pay | Admitting: Cardiology

## 2012-01-10 ENCOUNTER — Telehealth: Payer: Self-pay | Admitting: Cardiology

## 2012-01-10 ENCOUNTER — Other Ambulatory Visit: Payer: Self-pay | Admitting: Cardiology

## 2012-01-10 NOTE — Telephone Encounter (Signed)
New msg Pt said someone need to call for prior auth for revatio to ins carrier [phone]. Please call and let her know

## 2012-01-10 NOTE — Telephone Encounter (Signed)
Pt called back to leave a different number [phone] option 4 call this number first if can't reach anyone then call [phone]

## 2012-01-10 NOTE — Telephone Encounter (Signed)
Spoke with [employer], pt has coverage until oct 2013, left message for pt to call back. Not sure what the problem is.

## 2012-01-11 ENCOUNTER — Telehealth: Payer: Self-pay | Admitting: Cardiology

## 2012-01-11 NOTE — Telephone Encounter (Signed)
Pam RN left message medication authorized until Oct. 2013

## 2012-01-11 NOTE — Telephone Encounter (Signed)
Left message for pt that according to [employer] she has prior auth thru October 2013.  Requested she call back if further needs

## 2012-01-11 NOTE — Telephone Encounter (Signed)
Fu call °Pt returning your call  °

## 2012-01-11 NOTE — Telephone Encounter (Signed)
..   Requested Prescriptions   Pending Prescriptions Disp Refills  . metoprolol succinate (TOPROL-XL) 25 MG 24 hr tablet [Pharmacy Med Name: METOPROLOL SUCC ER 25 MG TAB] 30 tablet 4    Sig: TAKE 1 TABLET BY MOUTH EVERY DAY

## 2012-02-08 ENCOUNTER — Telehealth: Payer: Self-pay | Admitting: Cardiology

## 2012-02-08 NOTE — Telephone Encounter (Signed)
FYI:  Patient calling ahead to notify Dr. [name] she is dropping of DMV paperwork for Cardiac Clearance.  Patient asked that the paperwork be filled out and faxed to the appropriate Facility.  Patient can be reached at [phone] for additional information

## 2012-02-18 ENCOUNTER — Telehealth: Payer: Self-pay | Admitting: Obstetrics and Gynecology

## 2012-02-18 ENCOUNTER — Other Ambulatory Visit: Payer: Self-pay | Admitting: Cardiology

## 2012-02-18 NOTE — Telephone Encounter (Signed)
Tc from pt. Pt c/o left breast lump x 1week. No nipple d/c. No fever. Last mammo 11/2011-[facility] per pt. Appt sched 02-21-12@10 :45 with ep for eval. Pt voices understanding.

## 2012-02-18 NOTE — Telephone Encounter (Signed)
Lm on vm to cb per telephone call.  

## 2012-02-18 NOTE — Telephone Encounter (Signed)
Katie [name] received °

## 2012-02-18 NOTE — Telephone Encounter (Signed)
This [facility] pt

## 2012-02-21 ENCOUNTER — Encounter: Payer: Self-pay | Admitting: Obstetrics and Gynecology

## 2012-02-21 ENCOUNTER — Ambulatory Visit (INDEPENDENT_AMBULATORY_CARE_PROVIDER_SITE_OTHER): Payer: BC Managed Care – PPO | Admitting: Obstetrics and Gynecology

## 2012-02-21 VITALS — BP 102/60 | Temp 98.9°F | Ht 64.0 in | Wt 171.0 lb

## 2012-02-21 DIAGNOSIS — N611 Abscess of the breast and nipple: Secondary | ICD-10-CM

## 2012-02-21 DIAGNOSIS — I2510 Atherosclerotic heart disease of native coronary artery without angina pectoris: Secondary | ICD-10-CM

## 2012-02-21 DIAGNOSIS — A0472 Enterocolitis due to Clostridium difficile, not specified as recurrent: Secondary | ICD-10-CM

## 2012-02-21 DIAGNOSIS — N63 Unspecified lump in unspecified breast: Secondary | ICD-10-CM

## 2012-02-21 DIAGNOSIS — N049 Nephrotic syndrome with unspecified morphologic changes: Secondary | ICD-10-CM | POA: Insufficient documentation

## 2012-02-21 DIAGNOSIS — M351 Other overlap syndromes: Secondary | ICD-10-CM

## 2012-02-21 DIAGNOSIS — M358 Other specified systemic involvement of connective tissue: Secondary | ICD-10-CM

## 2012-02-21 DIAGNOSIS — N6320 Unspecified lump in the left breast, unspecified quadrant: Secondary | ICD-10-CM

## 2012-02-21 DIAGNOSIS — N61 Mastitis without abscess: Secondary | ICD-10-CM

## 2012-02-21 DIAGNOSIS — I314 Cardiac tamponade: Secondary | ICD-10-CM

## 2012-02-21 DIAGNOSIS — I272 Pulmonary hypertension, unspecified: Secondary | ICD-10-CM

## 2012-02-21 DIAGNOSIS — Z7901 Long term (current) use of anticoagulants: Secondary | ICD-10-CM

## 2012-02-21 DIAGNOSIS — I00 Rheumatic fever without heart involvement: Secondary | ICD-10-CM

## 2012-02-21 DIAGNOSIS — I2789 Other specified pulmonary heart diseases: Secondary | ICD-10-CM

## 2012-02-21 DIAGNOSIS — I509 Heart failure, unspecified: Secondary | ICD-10-CM

## 2012-02-21 NOTE — Progress Notes (Signed)
46 YO with normal mammogram 11/2011 but noticed a lump 1 month ago in left breast. Denies pain, nipple discharge, trauma (except patient's son's chin dug into breast), or skin changes.  Does have a maternal aunt (age 32) with breast cancer.  O: Left breast- upper outer quadrant (tail) with irregular lipomatous "feeling" mass approximately 4 cm x 4 cm; no nipple discharge/retractions, skin changing, tenderness or adenopathy  Right breast: no tenderness, skin changes, nipple discharge or retractions, no masses or adenopathy  A: Left Breast Mass  P: Diagnostic Mammogram with surgical referral as indicated

## 2012-02-22 NOTE — Telephone Encounter (Signed)
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

## 2012-02-22 NOTE — Telephone Encounter (Signed)
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 LMTCB for pt to let her know Dr [name] has not received forms.

## 2012-02-28 ENCOUNTER — Other Ambulatory Visit: Payer: Self-pay | Admitting: Obstetrics and Gynecology

## 2012-02-28 ENCOUNTER — Ambulatory Visit
Admission: RE | Admit: 2012-02-28 | Discharge: 2012-02-28 | Disposition: A | Discharge status: Home / Self Care | Payer: BC Managed Care – PPO | Source: Ambulatory Visit | Attending: Obstetrics and Gynecology | Admitting: Obstetrics and Gynecology

## 2012-02-28 DIAGNOSIS — N6320 Unspecified lump in the left breast, unspecified quadrant: Secondary | ICD-10-CM

## 2012-03-03 ENCOUNTER — Other Ambulatory Visit: Payer: BC Managed Care – PPO

## 2012-03-09 ENCOUNTER — Ambulatory Visit
Admission: RE | Admit: 2012-03-09 | Discharge: 2012-03-09 | Disposition: A | Discharge status: Home / Self Care | Payer: BC Managed Care – PPO | Source: Ambulatory Visit | Attending: Obstetrics and Gynecology | Admitting: Obstetrics and Gynecology

## 2012-03-09 ENCOUNTER — Other Ambulatory Visit (HOSPITAL_COMMUNITY)
Admission: RE | Admit: 2012-03-09 | Discharge: 2012-03-09 | Disposition: A | Discharge status: Home / Self Care | Payer: BC Managed Care – PPO | Source: Ambulatory Visit | Attending: Diagnostic Radiology | Admitting: Diagnostic Radiology

## 2012-03-09 ENCOUNTER — Other Ambulatory Visit: Payer: Self-pay | Admitting: Obstetrics and Gynecology

## 2012-03-09 DIAGNOSIS — N6320 Unspecified lump in the left breast, unspecified quadrant: Secondary | ICD-10-CM

## 2012-03-09 DIAGNOSIS — N6002 Solitary cyst of left breast: Secondary | ICD-10-CM

## 2012-03-09 DIAGNOSIS — Z01419 Encounter for gynecological examination (general) (routine) without abnormal findings: Secondary | ICD-10-CM | POA: Insufficient documentation

## 2012-03-09 HISTORY — PX: BREAST CYST ASPIRATION: SHX578

## 2012-03-27 ENCOUNTER — Ambulatory Visit: Payer: BC Managed Care – PPO | Admitting: Nurse Practitioner

## 2012-03-29 ENCOUNTER — Ambulatory Visit: Payer: Self-pay | Admitting: Pharmacist

## 2012-03-29 ENCOUNTER — Encounter: Payer: Self-pay | Admitting: Nurse Practitioner

## 2012-03-29 ENCOUNTER — Ambulatory Visit (INDEPENDENT_AMBULATORY_CARE_PROVIDER_SITE_OTHER): Payer: BC Managed Care – PPO | Admitting: Nurse Practitioner

## 2012-03-29 VITALS — BP 120/78 | HR 85 | Ht 64.75 in | Wt 170.8 lb

## 2012-03-29 DIAGNOSIS — I270 Primary pulmonary hypertension: Secondary | ICD-10-CM

## 2012-03-29 DIAGNOSIS — Z7901 Long term (current) use of anticoagulants: Secondary | ICD-10-CM

## 2012-03-29 DIAGNOSIS — Z9889 Other specified postprocedural states: Secondary | ICD-10-CM

## 2012-03-29 DIAGNOSIS — I059 Rheumatic mitral valve disease, unspecified: Secondary | ICD-10-CM

## 2012-03-29 NOTE — Assessment & Plan Note (Signed)
S/P MVR back in 2011. She continues to do very well. We will update her echo prior to her visit next month. Her forms are completed today.

## 2012-03-29 NOTE — Assessment & Plan Note (Signed)
Will check INR today

## 2012-03-29 NOTE — Patient Instructions (Addendum)
I think you are doing well.  You are stable from our standpoint.   We are going to update your echocardiogram of your heart.  See Dr. [name] next month as scheduled.   Call the [facility] office at [phone] if you have any questions, problems or concerns.

## 2012-03-29 NOTE — Assessment & Plan Note (Signed)
She is doing well with her current regimen.

## 2012-03-29 NOTE — Progress Notes (Signed)
Katie [name] Date of Birth: 05-20-1966 Medical Record #[id number]  History of Present Illness: Katie [name] is seen today for a work in visit. She is seen for Dr. [name]. 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 [name] in August of 2011. Last echo looks to be in October of 2011. Last seen here in January and was felt to be doing well.   She comes in today. She continues to do well. 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 No chest pain. No shortness of breath. Not dizzy or lightheaded. Still has some cough that was felt to be GERD. Tolerating her medicines. Remains very active running after her 35 year old son. She has planned follow up with Dr. [name] next month but needs these forms filled out by the end of this month.   Current Outpatient Prescriptions on File Prior to Visit  Medication Sig Dispense Refill  . acetaminophen (TYLENOL) 325 MG tablet Take 650 mg by mouth. As directed as needed       . aspirin 81 MG EC tablet Take 81 mg by mouth daily.        . furosemide (LASIX) 40 MG tablet ONE TABLET IN THE MORNING AND ONE TABLET IN THE EVENING  60 tablet  6  . hydroxychloroquine (PLAQUENIL) 200 MG tablet Take 2 tablets daily      . metoprolol succinate (TOPROL-XL) 25 MG 24 hr tablet TAKE 1 TABLET BY MOUTH EVERY DAY  30 tablet  4  . Multiple Vitamin (MULTIVITAMIN) tablet Take 1 tablet by mouth daily.        Marland Kitchen omeprazole (PRILOSEC OTC) 20 MG tablet Take 1 tablet (20 mg total) by mouth daily.  30 tablet  6  . predniSONE (DELTASONE) 1 MG tablet Take 1 mg by mouth. Take 3 tabs once daily       . predniSONE (DELTASONE) 5 MG tablet Take 5 mg by mouth daily.        . sildenafil (REVATIO) 20 MG tablet Take 1 tablet (20 mg total) by mouth 3 (three) times daily.  270 tablet  3  .  warfarin (COUMADIN) 5 MG tablet Take as directed by the anticoagulation clinic.  135 tablet  3  . DISCONTD: warfarin (COUMADIN) 5 MG tablet TAKE AS DIRECTED BY THE ANTICOAGULATION CLINIC  50 tablet  3    Allergies  Allergen Reactions  . Methotrexate     REACTION: intolerance    Past Medical History  Diagnosis Date  . Mixed connective tissue disease     diagnosis 1990. 8/10 ANA positive, anti-dDNA positive, anti-SCL70 negative  . Raynaud's syndrome   . Restrictive lung disease     secondary to MCTD. PFTs 03/06/99 VC 47% DLCO 26%. PFTs 09/12/08 VC 43% DLCO 45%. PFTs 6/10 FVC 37%, FEV1 34%, ratio 69%, TLC 50%. using home O2 periodically  . Nephrotic syndrome     secondary to SLE  . CHF (congestive heart failure)     secondary to mmoderate mitral stenosis and moderate mitral regurgitation in the setting of rheumatic vlave disease as well as severe pulmonary HTN likely due to a combination of MV disease and pulmoanary arterial HTN in the setting of collagen vascular disease. Not valvuloplasty candidate due to MR. patient had MV replacement with mechanical MV in 8/11.  Marland Kitchen CHF (congestive heart failure)  Echo post-op showed normally functioning mechanical MV, EF 60-65% with septal bounce, small mobile structure in the mid ventricle (? loose papillary muscle), RV-RA gradient 22 mmHg. Echo (10/11): EF 60-65%, normally functioning mechanical mitral vlavue.   . Pulmonary HTN     porbably secondary to combination of rheumatic MV disease and pulmonary arterial HYN form vollagen vascalr disease. CTA chest (7/10) with no evidence for pulmonary emboli or chronic pulmonary emboli. PA pressure 72/28 mean 50 with PVR 5.7 WU on initial RHC, PA pressure 45/21 mean 30 PVR 2.3 WU on f/u RHC on Revatio 80 mg three times a day (1/11).  After MV surgery, Revatio stopped.  . Pulmonary HTN     increased  dyspnea. Repeat RHC with mean RA 6, PA 42/20 mean 29, PVR 4.8 WU,  2.3. Revatio 20 mg  3x a day restarted. 3/11 6  min walk 452m. 2/12 6 min walk 427 m.   . Rheumatic fever   . CAD (coronary artery disease)     LHC (9/10): no angiographic CAD  . Warfarin anticoagulation     for mechanical MV, goal INR 2.5-3.5  . Cardiac tamponade     post-MVR (8/11): pericardial window, c/b sponatneous iliopsoas hematoma.   Marland Kitchen Abscess of right breast     sternal osteomyeliitis, pseudomonas  . C. difficile diarrhea 1/12    Past Surgical History  Procedure Date  . S/p mitral valvue replacement 05/13/2010    by Dr. [name] by minimally invasive window.   . Cesarean section   . Tubal ligation     History  Smoking status  . Never Smoker   Smokeless tobacco  . Never Used    History  Alcohol Use No    Family History  Problem Relation Age of Onset  . Emphysema Mother     smoker  . COPD Mother   . Heart disease Maternal Grandmother     side of family not given  . Diabetes Maternal Grandmother   . Hypertension Father     Review of Systems: The review of systems is positive for cough.  All other systems were reviewed and are negative.  Physical Exam: BP 120/78  Pulse 85  Ht 5' 4.75" (1.645 m)  Wt 170 lb 12.8 oz (77.474 kg)  BMI 28.64 kg/m2 Patient is very pleasant and in no acute distress. Skin is warm and dry. Color is normal.  HEENT is unremarkable. Normocephalic/atraumatic. PERRL. Sclera are nonicteric. Neck is supple. No masses. No JVD. Lungs are clear. Cardiac exam shows a regular rate and rhythm. Good MV click. Abdomen is soft. Extremities are without edema. Gait and ROM are intact. No gross neurologic deficits noted.   LABORATORY DATA: EKG today shows sinus rhythm. She has poor R wave progression. Tracing is unchanged.   Lab Results  Component Value Date   WBC 4.9 09/29/2010   HGB 11.9* 09/29/2010   HCT 39.1 09/29/2010   PLT 232 09/29/2010   GLUCOSE 79 11/04/2011   CHOL 179 08/21/2009   TRIG 99.0 08/21/2009   HDL 44.20 08/21/2009   LDLCALC 115* 08/21/2009   ALT 16 07/01/2010   AST 29  07/01/2010   NA 139 11/04/2011   K 3.8 11/04/2011   CL 105 11/04/2011   CREATININE 0.8 11/04/2011   BUN 12 11/04/2011   CO2 29 11/04/2011   TSH 4.08 07/01/2010   INR 3.2 12/31/2011   HGBA1C  Value: 5.5 (NOTE)  According to the ADA Clinical Practice Recommendations for 2011, when HbA1c is used as a screening test:   >=6.5%   Diagnostic of Diabetes Mellitus           (if abnormal result  is confirmed)  5.7-6.4%   Increased risk of developing Diabetes Mellitus  References:Diagnosis and Classification of Diabetes Mellitus,Diabetes Care,2011,34(Suppl 1):S62-S69 and Standards of Medical Care in         Diabetes - 2011,Diabetes Care,2011,34  (Suppl 1):S11-S61. 05/11/2010     Assessment / Plan:

## 2012-03-29 NOTE — Telephone Encounter (Signed)
DMV form completed today at appt with Sunday Spillers

## 2012-05-01 ENCOUNTER — Ambulatory Visit (HOSPITAL_COMMUNITY): Payer: BC Managed Care – PPO | Attending: Cardiology | Admitting: Radiology

## 2012-05-01 DIAGNOSIS — Z9889 Other specified postprocedural states: Secondary | ICD-10-CM

## 2012-05-01 DIAGNOSIS — I051 Rheumatic mitral insufficiency: Secondary | ICD-10-CM

## 2012-05-01 DIAGNOSIS — I319 Disease of pericardium, unspecified: Secondary | ICD-10-CM | POA: Insufficient documentation

## 2012-05-01 DIAGNOSIS — M329 Systemic lupus erythematosus, unspecified: Secondary | ICD-10-CM | POA: Insufficient documentation

## 2012-05-01 DIAGNOSIS — I2789 Other specified pulmonary heart diseases: Secondary | ICD-10-CM | POA: Insufficient documentation

## 2012-05-01 DIAGNOSIS — M359 Systemic involvement of connective tissue, unspecified: Secondary | ICD-10-CM | POA: Insufficient documentation

## 2012-05-01 DIAGNOSIS — I2510 Atherosclerotic heart disease of native coronary artery without angina pectoris: Secondary | ICD-10-CM | POA: Insufficient documentation

## 2012-05-01 DIAGNOSIS — I509 Heart failure, unspecified: Secondary | ICD-10-CM | POA: Insufficient documentation

## 2012-05-01 DIAGNOSIS — J984 Other disorders of lung: Secondary | ICD-10-CM | POA: Insufficient documentation

## 2012-05-01 NOTE — Progress Notes (Signed)
Echocardiogram performed.  

## 2012-05-03 ENCOUNTER — Ambulatory Visit (INDEPENDENT_AMBULATORY_CARE_PROVIDER_SITE_OTHER): Payer: BC Managed Care – PPO | Admitting: Cardiology

## 2012-05-03 ENCOUNTER — Encounter: Payer: Self-pay | Admitting: Cardiology

## 2012-05-03 VITALS — BP 116/62 | HR 84 | Ht 64.75 in | Wt 176.0 lb

## 2012-05-03 DIAGNOSIS — I272 Pulmonary hypertension, unspecified: Secondary | ICD-10-CM

## 2012-05-03 DIAGNOSIS — I059 Rheumatic mitral valve disease, unspecified: Secondary | ICD-10-CM

## 2012-05-03 DIAGNOSIS — I2789 Other specified pulmonary heart diseases: Secondary | ICD-10-CM

## 2012-05-03 NOTE — Assessment & Plan Note (Signed)
Pulmonary HTN is likely a mixed picture, from mitral valve disease as well as mixed connective tissue disease.  She had mild residual pulmonary HTN with moderately increased PVR even after mitral valve surgery on last RHC (though this is improved compared to initial RHC).  Revatio was restarted at 20 mg tid and she has been doing well symptomatically.  Most recent echo in 7/13 actually did not show significant pulmonary HTN.

## 2012-05-03 NOTE — Progress Notes (Signed)
Patient ID: Katie [name], female   DOB: 16-Dec-1965, 46 y.o.   MRN: [id number] 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    Lately, she has been doing quite well symptomatically.  She is able to walk without any shortness of breath on flat ground and up a flight of steps.  She can lift and carry her son.  She works as a Surveyor, mining.  Echo recently showed normal EF, normal mechanical mitral valve, and no significant echo evidence for pulmonary hypertension.   Labs (8/11): HCT 27 => 30, creatinine 0.98, INR 4 Labs (9/11): K 4.6, creatinine 0.9, HCT 32.7, BNP 214=>146, LFTs normal, TSH normal Labs (1/12): HCT 37.9, K 3.5, creatinine 0.9 Labs (4/12): LDL 108, HDL 43, TSH normal, K 4.1, creatinine 0.8, HCT 37.9 Labs (1/13): K 3.8, creatinine 0.8  Allergies (verified):  1)  ! Methotrexate  Past Medical History: 1. Mixed connective tissue disease: diagnosis 1990. 8/10 ANA positive, anti-dsDNA positive, anti-SCL70 negative 2. 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 CHF: Secondary to moderate mitral stenosis and moderate mitral regurgitation in the setting of rheumatic mitral valve disease as well as severe pulmonary HTN likely due to a combination of MV disease and pulmonary arterial HTN in the setting of collagen vascular disease.   Not valvuloplasty candidate due to MR.  Patient had MV replacement with mechanical MV in 8/11.  Echo (8/11) post-op showed normally functioning mechanical MV, EF 60-65% with septal bounce, small mobile structure in the  mid ventricle (? loose papillary muscle), RV-RA gradient 22 mmHg.  Echo (10/11): EF 60-65%, normally functioning mechanical mitral valve.  Echo (7/13): EF 55-60%, mechanical mitral valve with mean gradient 4 mmHg, PA systolic pressure 29 mmHg.  6.  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  CTA chest (7/10) with no evidence for pulmonary emboli or chronic pulmonary emboli.  PA pressure 72/28 mean 50 with PVR 5.7 WU on initial RHC, PA pressure 45/21 mean 30 PVR 2.3 WU on f/u RHC on Revatio 80 mg three times a day (1/11).  After MV surgery, Revatio stopped.  Increased dyspnea.  Repeat RHC with mean RA 6, PA 42/20 (mean 29), PVR 4.8 WU, CI 2.3.  Revatio 20 mg three times a day restarted.  Echo (7/13) with PA systolic pressure 29 mmHg.  - 3/11 6 min walk 427 m - 2/12 6 min walk 427 m - 1/13 6 min walk 411 m 7.  Rheumatic fever 8.  LHC (9/10): no angiographic CAD.  9.  Warfarin anticoagulation for mechanical MV, goal INR 2.5-3.5 10.  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  11. 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 C difficile diarrhea 1/12 13. GERD  Family History: Emphysema mother, smoker.  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   Current Outpatient Prescriptions  Medication Sig Dispense Refill  . acetaminophen (TYLENOL) 325 MG tablet Take  650 mg by mouth. As directed as needed       . aspirin 81 MG EC tablet Take 81 mg by mouth daily.        . hydroxychloroquine (PLAQUENIL) 200 MG tablet Take 2 tablets daily      . metoprolol succinate (TOPROL-XL) 25 MG 24 hr tablet TAKE 1 TABLET BY MOUTH EVERY DAY  30 tablet  4  . Multiple Vitamin (MULTIVITAMIN) tablet Take 1 tablet by mouth daily.        Marland Kitchen omeprazole (PRILOSEC OTC) 20 MG tablet Take 1 tablet (20 mg total) by  mouth daily.  30 tablet  6  . predniSONE (DELTASONE) 1 MG tablet Take 1 mg by mouth. Take 3 tabs once daily       . predniSONE (DELTASONE) 5 MG tablet Take 5 mg by mouth daily.        . sildenafil (REVATIO) 20 MG tablet Take 1 tablet (20 mg total) by mouth 3 (three) times daily.  270 tablet  3  . warfarin (COUMADIN) 5 MG tablet Take as directed by the anticoagulation clinic.  135 tablet  3  . DISCONTD: furosemide (LASIX) 40 MG tablet ONE TABLET IN THE MORNING AND ONE TABLET IN THE EVENING  60 tablet  6  . furosemide (LASIX) 20 MG tablet Take 1 tablet (20 mg total) by mouth daily.        BP 116/62  Pulse 84  Ht 5' 4.75" (1.645 m)  Wt 176 lb (79.833 kg)  BMI 29.51 kg/m2 General:  Well developed, well nourished, in no acute distress. Neck:  Neck supple, no JVD. No masses, thyromegaly or abnormal cervical nodes. Lungs:  Clear bilaterally to auscultation and percussion. Heart:  Non-displaced PMI,  regular rate and rhythm, S1, S2. mechanical S1.  Carotid upstroke normal, no bruit. Pedals normal pulses. No edema.  Abdomen:  Bowel sounds positive; abdomen soft and non-tender without masses, organomegaly, or hernias noted. No hepatosplenomegaly. Extremities:  No clubbing or cyanosis. Neurologic:  Alert and oriented x 3. Psych:  Normal affect.

## 2012-05-03 NOTE — Assessment & Plan Note (Signed)
Status post mitral valve replacement, stable from valve standpoint.  She is on coumadin goal INR 2.5 - 3.5 and ASA 81 mg daily.  

## 2012-05-03 NOTE — Patient Instructions (Addendum)
Your physician wants you to follow-up in: 6 months with Dr McLean. (January 2014). You will receive a reminder letter in the mail two months in advance. If you don't receive a letter, please call our office to schedule the follow-up appointment.  

## 2012-08-09 ENCOUNTER — Other Ambulatory Visit: Payer: Self-pay | Admitting: Obstetrics and Gynecology

## 2012-08-09 DIAGNOSIS — N6009 Solitary cyst of unspecified breast: Secondary | ICD-10-CM

## 2012-08-15 ENCOUNTER — Ambulatory Visit: Payer: BC Managed Care – PPO | Admitting: Obstetrics and Gynecology

## 2012-08-16 ENCOUNTER — Ambulatory Visit
Admission: RE | Admit: 2012-08-16 | Discharge: 2012-08-16 | Disposition: A | Discharge status: Home / Self Care | Payer: BC Managed Care – PPO | Source: Ambulatory Visit | Attending: Obstetrics and Gynecology | Admitting: Obstetrics and Gynecology

## 2012-08-16 DIAGNOSIS — N6009 Solitary cyst of unspecified breast: Secondary | ICD-10-CM

## 2012-09-05 ENCOUNTER — Ambulatory Visit: Payer: BC Managed Care – PPO | Admitting: Obstetrics and Gynecology

## 2012-09-12 ENCOUNTER — Telehealth: Payer: Self-pay | Admitting: Cardiology

## 2012-09-12 NOTE — Telephone Encounter (Signed)
New Problem:    Patient called in needing some documents signed by Dr. [name] for one of her medications.  Please call back.

## 2012-09-12 NOTE — Telephone Encounter (Signed)
I have paperwork for Dr [name] to sign. I will call pt once it is signed. LMTCB for pt to let her know.

## 2012-09-12 NOTE — Telephone Encounter (Signed)
Walk in pt Form " RSVP Enrollment Form" Pt Needs Completed  Sent to Anne/McLean 09/12/12/KM

## 2012-09-14 NOTE — Telephone Encounter (Signed)
Renewal for Revatio signed by Dr [name]. Form to HIM to fax. Pt aware form has been signed by Dr [name].

## 2012-09-15 ENCOUNTER — Telehealth: Payer: Self-pay | Admitting: Cardiology

## 2012-09-15 NOTE — Telephone Encounter (Signed)
RSVP Enrollment Form, mailed to Pt Per  Her request 09/15/12/KM

## 2012-09-15 NOTE — Telephone Encounter (Signed)
LMOVM for Pt RSVP Enrollment Form/Healthcare Provider Application  Ready for pick up Been completed & signed By Dr.McLean & Faxed  To [phone] 09/15/12/KM

## 2012-10-24 ENCOUNTER — Ambulatory Visit: Payer: BC Managed Care – PPO | Admitting: Cardiology

## 2012-11-02 ENCOUNTER — Telehealth: Payer: Self-pay | Admitting: Cardiology

## 2012-11-02 NOTE — Telephone Encounter (Signed)
[employer] calling re status of fax sent 10-19-12 re prior auth needed for pt's revatio

## 2012-11-02 NOTE — Telephone Encounter (Signed)
Will forward to nurse 

## 2012-11-05 ENCOUNTER — Other Ambulatory Visit: Payer: Self-pay | Admitting: Cardiology

## 2012-11-06 NOTE — Telephone Encounter (Signed)
I spoke with Tammy at [facility] [phone]--revatio is approved for lifetime. I also spoke with Andrey Campanile at [employer] [phone]. She is aware that revatio is approved for lifetime.

## 2012-11-13 ENCOUNTER — Ambulatory Visit: Payer: BC Managed Care – PPO | Admitting: Obstetrics and Gynecology

## 2012-11-13 ENCOUNTER — Encounter: Payer: Self-pay | Admitting: Obstetrics and Gynecology

## 2012-11-13 VITALS — BP 102/58 | Ht 64.0 in | Wt 175.0 lb

## 2012-11-13 DIAGNOSIS — Z124 Encounter for screening for malignant neoplasm of cervix: Secondary | ICD-10-CM

## 2012-11-13 DIAGNOSIS — Z01419 Encounter for gynecological examination (general) (routine) without abnormal findings: Secondary | ICD-10-CM

## 2012-11-13 MED ORDER — METRONIDAZOLE 500 MG PO TABS: 500.0000 mg | ORAL_TABLET | Freq: Two times a day (BID) | ORAL | Status: AC

## 2012-11-13 NOTE — Progress Notes (Addendum)
ANNUAL GYNECOLOGIC EXAMINATION   Katie [name] is a 47 y.o. female, G3P0011, who presents for an annual exam. The patient has a history of pulmonary hypertension, mitral stenosis, nephrotic syndrome, and rheumatoid arthritis.  She is on multiple medications including prednisone.  She is status post tubal ligation. She complains of a vaginal discharge with an odor.  History   Social History  . Marital Status: Single    Spouse Name: N/A    Number of Children: N/A  . Years of Education: N/A   Social History Main Topics  . Smoking status: Never Smoker   . Smokeless tobacco: Never Used  . Alcohol Use: No  . Drug Use: No  . Sexually Active: Not Currently    Birth Control/ Protection: Other-see comments     Comment: BTL   Other Topics Concern  . 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     Menstrual cycle:   LMP: Patient's last menstrual period was 11/01/2012.             The following portions of the patient's history were reviewed and updated as appropriate: allergies, current medications, past family history, past medical history, past social history, past surgical history and problem list.  Review of Systems Pertinent items are noted in HPI. Breast:Negative for breast lump,nipple discharge or nipple retraction Gastrointestinal: Negative for abdominal pain, change in bowel habits or rectal bleeding Urinary:negative   Objective:    BP 102/58  Ht 5\' 4"  (1.626 m)  Wt 175 lb (79.379 kg)  BMI 30.04 kg/m2  LMP 11/01/2012    Weight:  Wt Readings from Last 1 Encounters:  11/13/12 175 lb (79.379 kg)          BMI: Body mass index is 30.04 kg/(m^2).  General Appearance: Alert, appropriate appearance for age. No acute distress HEENT: Grossly normal Neck / Thyroid: Supple, no masses, nodes or enlargement Lungs: clear to auscultation bilaterally Back: No CVA tenderness Breast Exam: No masses or nodes.No dimpling, nipple  retraction or discharge. Cardiovascular: Regular rate and rhythm. S1, S2, no murmur Gastrointestinal: Soft, non-tender, no masses or organomegaly  ++++++++++++++++++++++++++++++++++++++++++++++++++++++++  Pelvic Exam: External genitalia: normal general appearance Vaginal: normal without tenderness, induration or masses. Relaxation: Yes Cervix: normal appearance Adnexa: normal bimanual exam Uterus: normal size, shape, and consistency Rectovaginal: normal rectal, no masses  ++++++++++++++++++++++++++++++++++++++++++++++++++++++++  Lymphatic Exam: Non-palpable nodes in neck, clavicular, axillary, or inguinal regions Neurologic: Normal speech, no tremor  Psychiatric: Alert and oriented, appropriate affect.   Wet prep: PH 5.5.  Clue cells positive.  Negative trait.  Negative yeast. Whiff negative  Assessment:    Normal gyn exam   Overweight or obese: Yes   Pelvic relaxation: Yes  Pulmonary hypertension, rheumatoid arthritis, mitral stenosis, and nephrotic syndrome  Bacterial vaginosis   Plan:    mammogram pap smear return annually or prn Contraception:bilateral tubal ligation    Medications prescribed: Metronidazole 500 mg twice each day for 7 days.  STD screen request: No   The updated Pap smear screening guidelines were discussed with the patient. The patient requested that I obtain a Pap smear: Yes.  Kegel exercises discussed: No.  Proper diet and regular exercise were reviewed.  Annual mammograms recommended starting at age 100. Proper breast care was discussed.  Screening colonoscopy is recommended beginning at age 41.  Regular health maintenance was reviewed.  Sleep hygiene was discussed.  Adequate calcium and vitamin D intake was emphasized.  Leonard Schwartz M.D.    Regular Periods: yes Mammogram: 03/09/2012  Monthly Breast Ex.: yes Exercise: no  Tetanus < 10 years: yes Seatbelts: yes  NI. Bladder Functn.: yes Abuse at home: no  Daily BM's:  yes Stressful Work: no  Healthy Diet: yes Sigmoid-Colonoscopy: 10/2011 Normal  Calcium: no Medical problems this year: No   LAST PAP:08/02/2011 Normal  Contraception: BTL   Mammogram:  03/09/2012 Normal  PCP: Dr Thayer Headings  PMH: none   [facility]: aunt passed away  Last Bone Scan: per pt 2008

## 2012-11-13 NOTE — Addendum Note (Signed)
Addended by: Tim Lair on: 11/13/2012 10:43 AM   Modules accepted: Orders

## 2012-11-14 ENCOUNTER — Encounter: Payer: Self-pay | Admitting: Obstetrics and Gynecology

## 2012-11-14 LAB — PAP IG W/ RFLX HPV ASCU

## 2012-11-23 ENCOUNTER — Ambulatory Visit: Payer: BC Managed Care – PPO | Admitting: Cardiology

## 2012-12-06 ENCOUNTER — Other Ambulatory Visit: Payer: Self-pay

## 2013-01-01 ENCOUNTER — Ambulatory Visit (INDEPENDENT_AMBULATORY_CARE_PROVIDER_SITE_OTHER): Payer: BC Managed Care – PPO | Admitting: Cardiology

## 2013-01-01 ENCOUNTER — Encounter: Payer: Self-pay | Admitting: Cardiology

## 2013-01-01 VITALS — BP 112/62 | HR 75 | Ht 65.0 in | Wt 174.8 lb

## 2013-01-01 DIAGNOSIS — I2789 Other specified pulmonary heart diseases: Secondary | ICD-10-CM

## 2013-01-01 DIAGNOSIS — I059 Rheumatic mitral valve disease, unspecified: Secondary | ICD-10-CM

## 2013-01-01 DIAGNOSIS — I272 Pulmonary hypertension, unspecified: Secondary | ICD-10-CM

## 2013-01-01 NOTE — Progress Notes (Signed)
Patient ID: Katie [name], female   DOB: 08/07/1966, 47 y.o.   MRN: [id number] PCP: Dr. 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    Lately, she has been doing quite well symptomatically.  She is able to walk without any shortness of breath on flat ground and up a flight of steps.  She can lift and carry her son, who is now 3.  She works as a Surveyor, mining. Weight is down 2 lbs.    Echo in 7/13 showed normal EF, normal mechanical mitral valve, and no significant echo evidence for pulmonary hypertension.   ECG: NSR, LAFB, poor anterior R wave progression  Labs (8/11): HCT 27 => 30, creatinine 0.98, INR 4 Labs (9/11): K 4.6, creatinine 0.9, HCT 32.7, BNP 214=>146, LFTs normal, TSH normal Labs (1/12): HCT 37.9, K 3.5, creatinine 0.9 Labs (4/12): LDL 108, HDL 43, TSH normal, K 4.1, creatinine 0.8, HCT 37.9 Labs (1/13): K 3.8, creatinine 0.8  Allergies (verified):  1)  ! Methotrexate  Past Medical History: 1. Mixed connective tissue disease: diagnosis 1990. 8/10 ANA positive, anti-dsDNA positive, anti-SCL70 negative 2. 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 CHF: Secondary to moderate mitral stenosis and moderate mitral regurgitation in the setting of rheumatic mitral valve disease as well as severe pulmonary HTN likely due to a combination of MV disease and pulmonary arterial HTN in the setting of collagen vascular disease.   Not valvuloplasty candidate due to MR.  Patient had MV replacement with mechanical MV in 8/11.  Echo (8/11) post-op showed normally  functioning mechanical MV, EF 60-65% with septal bounce, small mobile structure in the mid ventricle (? loose papillary muscle), RV-RA gradient 22 mmHg.  Echo (10/11): EF 60-65%, normally functioning mechanical mitral valve.  Echo (7/13): EF 55-60%, mechanical mitral valve with mean gradient 4 mmHg, PA systolic pressure 29 mmHg.  6.  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  CTA chest (7/10) with no evidence for pulmonary emboli or chronic pulmonary emboli.  PA pressure 72/28 mean 50 with PVR 5.7 WU on initial RHC, PA pressure 45/21 mean 30 PVR 2.3 WU on f/u RHC on Revatio 80 mg three times a day (1/11).  After MV surgery, Revatio stopped.  Increased dyspnea.  Repeat RHC with mean RA 6, PA 42/20 (mean 29), PVR 4.8 WU, CI 2.3.  Revatio 20 mg three times a day restarted.  Echo (7/13) with PA systolic pressure 29 mmHg.  - 3/11 6 min walk 427 m - 2/12 6 min walk 427 m - 1/13 6 min walk 411 m 7.  Rheumatic fever 8.  LHC (9/10): no angiographic CAD.  9.  Warfarin anticoagulation for mechanical MV, goal INR 2.5-3.5 10.  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  11. 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 C difficile diarrhea 1/12 13. GERD  Family History: Emphysema mother, smoker.  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   Current Outpatient Prescriptions  Medication Sig Dispense Refill  . acetaminophen (TYLENOL) 325 MG tablet Take 650 mg by mouth. As directed as needed       . aspirin 81 MG EC tablet Take 81 mg by mouth daily.        . furosemide (LASIX) 20 MG tablet Take 1 tablet (20 mg total) by mouth daily.      . hydroxychloroquine (PLAQUENIL) 200 MG tablet Take 2 tablets daily      . metoprolol succinate (TOPROL-XL) 25 MG 24 hr tablet TAKE 1 TABLET BY MOUTH EVERY  DAY  30 tablet  4  . Multiple Vitamin (MULTIVITAMIN) tablet Take 1 tablet by mouth daily.        . predniSONE (DELTASONE) 5 MG tablet Take 5 mg by mouth daily.        . sildenafil (REVATIO) 20 MG tablet Take 1 tablet (20 mg total) by mouth 3 (three) times daily.  270 tablet  3  . warfarin (COUMADIN) 5 MG tablet Take as directed by the anticoagulation clinic.  135 tablet  3  . omeprazole (PRILOSEC OTC) 20 MG tablet Take 1 tablet (20 mg total) by mouth daily.  30 tablet  6   No current facility-administered medications for this visit.    BP 112/62  Pulse 75  Ht 5\' 5"  (1.651 m)  Wt 174 lb 12.8 oz (79.289 kg)  BMI 29.09 kg/m2 General:  Well developed, well nourished, in no acute distress. Neck:  Neck supple, no JVD. No masses, thyromegaly or abnormal cervical nodes. Lungs:  Clear bilaterally to auscultation and percussion. Heart:  Non-displaced PMI,  regular rate and rhythm, S1, S2. mechanical S1.  Carotid upstroke normal, no bruit. Pedals normal pulses. No edema.  Abdomen:  Bowel sounds positive; abdomen soft and non-tender without masses, organomegaly, or hernias noted. No hepatosplenomegaly. Extremities:  No clubbing or cyanosis. Neurologic:  Alert and oriented x 3. Psych:  Normal affect.  Assessment/Plan:  1. Status post mechanical MVR:  Mitral valve replacement, stable from valve standpoint.  She is on coumadin goal INR 2.5 - 3.5 and ASA 81 mg daily.   1.  Pulmonary HTN:  Pulmonary HTN is likely a mixed picture, from mitral valve disease as well as mixed connective tissue disease.  She had mild residual pulmonary HTN with moderately increased PVR even after mitral valve surgery on last RHC (though this is improved compared to initial RHC).  Revatio was restarted at 20 mg tid and she has been doing well symptomatically.  Most recent echo in 7/13 actually did not show significant pulmonary HTN.    Followup 6 months.   Marca Ancona 01/01/2013

## 2013-01-01 NOTE — Patient Instructions (Signed)
Your physician wants you to follow-up in: 6 months with Dr [name]. (September 2014).  You will receive a reminder letter in the mail two months in advance. If you don't receive a letter, please call our office to schedule the follow-up appointment.

## 2013-01-10 ENCOUNTER — Ambulatory Visit
Admission: RE | Admit: 2013-01-10 | Discharge: 2013-01-10 | Disposition: A | Discharge status: Home / Self Care | Payer: BC Managed Care – PPO | Source: Ambulatory Visit

## 2013-01-10 DIAGNOSIS — Z1231 Encounter for screening mammogram for malignant neoplasm of breast: Secondary | ICD-10-CM

## 2013-01-11 ENCOUNTER — Other Ambulatory Visit: Payer: Self-pay | Admitting: *Deleted

## 2013-01-11 MED ORDER — SILDENAFIL CITRATE 20 MG PO TABS: 20.0000 mg | ORAL_TABLET | Freq: Three times a day (TID) | ORAL | Status: DC

## 2013-01-13 ENCOUNTER — Other Ambulatory Visit: Payer: Self-pay | Admitting: Cardiology

## 2013-01-15 ENCOUNTER — Other Ambulatory Visit: Payer: Self-pay | Admitting: *Deleted

## 2013-01-17 ENCOUNTER — Telehealth: Payer: Self-pay | Admitting: Cardiology

## 2013-01-17 NOTE — Telephone Encounter (Signed)
Follow Up    Following up on a new prescription request for REVATIO. Would like to speak to nurse.

## 2013-01-17 NOTE — Telephone Encounter (Signed)
Spoke with Katie [name]. I have given her a prescription for pt for revatio 20mg  tid 90 day supply 3 refills

## 2013-02-05 ENCOUNTER — Encounter: Payer: Self-pay | Admitting: Cardiology

## 2013-07-10 ENCOUNTER — Other Ambulatory Visit: Payer: Self-pay | Admitting: Internal Medicine

## 2013-07-10 ENCOUNTER — Ambulatory Visit
Admission: RE | Admit: 2013-07-10 | Discharge: 2013-07-10 | Disposition: A | Discharge status: Home / Self Care | Payer: BC Managed Care – PPO | Source: Ambulatory Visit | Attending: Internal Medicine | Admitting: Internal Medicine

## 2013-07-10 DIAGNOSIS — R609 Edema, unspecified: Secondary | ICD-10-CM

## 2013-07-10 DIAGNOSIS — M79605 Pain in left leg: Secondary | ICD-10-CM

## 2013-08-16 ENCOUNTER — Other Ambulatory Visit: Payer: Self-pay

## 2013-10-10 ENCOUNTER — Other Ambulatory Visit: Payer: Self-pay | Admitting: Cardiology

## 2013-11-29 ENCOUNTER — Other Ambulatory Visit: Payer: Self-pay | Admitting: Cardiology

## 2013-12-06 ENCOUNTER — Ambulatory Visit: Payer: BC Managed Care – PPO | Admitting: Cardiology

## 2014-01-02 ENCOUNTER — Other Ambulatory Visit: Payer: Self-pay

## 2014-01-02 DIAGNOSIS — Z1231 Encounter for screening mammogram for malignant neoplasm of breast: Secondary | ICD-10-CM

## 2014-01-23 ENCOUNTER — Ambulatory Visit
Admission: RE | Admit: 2014-01-23 | Discharge: 2014-01-23 | Disposition: A | Discharge status: Home / Self Care | Payer: BC Managed Care – PPO | Source: Ambulatory Visit

## 2014-01-23 DIAGNOSIS — Z1231 Encounter for screening mammogram for malignant neoplasm of breast: Secondary | ICD-10-CM

## 2014-02-01 ENCOUNTER — Other Ambulatory Visit: Payer: Self-pay | Admitting: *Deleted

## 2014-02-01 MED ORDER — SILDENAFIL CITRATE 20 MG PO TABS: 20.0000 mg | ORAL_TABLET | Freq: Three times a day (TID) | ORAL | Status: DC

## 2014-02-06 ENCOUNTER — Ambulatory Visit (INDEPENDENT_AMBULATORY_CARE_PROVIDER_SITE_OTHER): Payer: BC Managed Care – PPO | Admitting: Cardiology

## 2014-02-06 ENCOUNTER — Encounter: Payer: Self-pay | Admitting: *Deleted

## 2014-02-06 ENCOUNTER — Encounter: Payer: Self-pay | Admitting: Cardiology

## 2014-02-06 VITALS — BP 110/60 | HR 78 | Ht 65.0 in | Wt 179.0 lb

## 2014-02-06 DIAGNOSIS — I2789 Other specified pulmonary heart diseases: Secondary | ICD-10-CM

## 2014-02-06 DIAGNOSIS — M329 Systemic lupus erythematosus, unspecified: Secondary | ICD-10-CM

## 2014-02-06 DIAGNOSIS — I059 Rheumatic mitral valve disease, unspecified: Secondary | ICD-10-CM

## 2014-02-06 DIAGNOSIS — J8410 Pulmonary fibrosis, unspecified: Secondary | ICD-10-CM

## 2014-02-06 LAB — CBC WITH DIFFERENTIAL/PLATELET
BASOS ABS: 0 10*3/uL (ref 0.0–0.1)
BASOS PCT: 0.7 % (ref 0.0–3.0)
EOS ABS: 0 10*3/uL (ref 0.0–0.7)
Eosinophils Relative: 0.1 % (ref 0.0–5.0)
HEMATOCRIT: 34.1 % — AB (ref 36.0–46.0)
Hemoglobin: 11.3 g/dL — ABNORMAL LOW (ref 12.0–15.0)
LYMPHS ABS: 1.3 10*3/uL (ref 0.7–4.0)
LYMPHS PCT: 30.7 % (ref 12.0–46.0)
MCHC: 33 g/dL (ref 30.0–36.0)
MCV: 90.9 fl (ref 78.0–100.0)
Monocytes Absolute: 0.6 10*3/uL (ref 0.1–1.0)
Monocytes Relative: 14.3 % — ABNORMAL HIGH (ref 3.0–12.0)
NEUTROS ABS: 2.2 10*3/uL (ref 1.4–7.7)
Neutrophils Relative %: 54.2 % (ref 43.0–77.0)
Platelets: 184 10*3/uL (ref 150.0–400.0)
RBC: 3.75 Mil/uL — AB (ref 3.87–5.11)
RDW: 14.8 % — AB (ref 11.5–14.6)
WBC: 4.1 10*3/uL — ABNORMAL LOW (ref 4.5–10.5)

## 2014-02-06 LAB — BASIC METABOLIC PANEL
BUN: 12 mg/dL (ref 6–23)
CHLORIDE: 103 meq/L (ref 96–112)
CO2: 28 meq/L (ref 19–32)
Calcium: 8.3 mg/dL — ABNORMAL LOW (ref 8.4–10.5)
Creatinine, Ser: 0.8 mg/dL (ref 0.4–1.2)
GFR: 101.74 mL/min (ref >60.00)
Glucose, Bld: 79 mg/dL (ref 70–99)
Potassium: 3.1 mEq/L — ABNORMAL LOW (ref 3.5–5.1)
Sodium: 137 mEq/L (ref 135–145)

## 2014-02-06 LAB — BRAIN NATRIURETIC PEPTIDE: Pro B Natriuretic peptide (BNP): 153 pg/mL — ABNORMAL HIGH (ref 0.0–100.0)

## 2014-02-06 NOTE — Patient Instructions (Signed)
Your physician recommends that you return for lab work today--BMET/BNP/CBCd  Your physician has requested that you have an echocardiogram. Echocardiography is a painless test that uses sound waves to create images of your heart. It provides your doctor with information about the size and shape of your heart and how well your heart's chambers and valves are working. This procedure takes approximately one hour. There are no restrictions for this procedure.  Your physician has recommended that you have a pulmonary function test. Pulmonary Function Tests are a group of tests that measure how well air moves in and out of your lungs.  Your physician recommends that you schedule a follow-up appointment with Dr  Aundra Dubin in 6 months in the [facility].

## 2014-02-07 ENCOUNTER — Telehealth: Payer: Self-pay | Admitting: Cardiology

## 2014-02-07 MED ORDER — POTASSIUM CHLORIDE CRYS ER 20 MEQ PO TBCR: 20.0000 meq | EXTENDED_RELEASE_TABLET | Freq: Two times a day (BID) | ORAL | Status: DC

## 2014-02-07 NOTE — Progress Notes (Signed)
Patient ID: Katie [name], female   DOB: 12-Sep-1966, 48 y.o.   MRN: [id number] PCP: Dr. 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    Echo in 7/13 showed normal EF, normal mechanical mitral valve, and no significant echo evidence for pulmonary hypertension.   Patient got less exercise than normal over the winter.  She now will get some dyspnea when walking up a flight of stairs, going up an incline, or walking more about 100 yards.  No chest pain, no syncope. No bleeding problems on warfarin.   6 minute walk 448 m (today)  ECG: NSR,  poor anterior R wave progression  Labs (8/11): HCT 27 => 30, creatinine 0.98, INR 4 Labs (9/11): K 4.6, creatinine 0.9, HCT 32.7, BNP 214=>146, LFTs normal, TSH normal Labs (1/12): HCT 37.9, K 3.5, creatinine 0.9 Labs (4/12): LDL 108, HDL 43, TSH normal, K 4.1, creatinine 0.8, HCT 37.9 Labs (1/13): K 3.8, creatinine 0.8 Labs (11/14): K 3.6, creatinine 0.71, LDL 94, HCT 38  Allergies (verified):  1)  ! Methotrexate  Past Medical History: 1. Mixed connective tissue disease: diagnosis 1990. 8/10 ANA positive, anti-dsDNA positive, anti-SCL70 negative 2. 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     - PFT's 03/06/99  VC 47%  DLC0 26%    - PFT's 09/12/08  VC 43%  DLC0 45%    - PFT's 6/10 FVC 37%, FEV1 34%, ratio 69%, TLC 50% 4. 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 CHF: Secondary to moderate mitral stenosis and moderate mitral regurgitation in the setting of rheumatic mitral valve disease as well as severe pulmonary HTN likely due to a combination of MV disease and pulmonary arterial HTN in the setting of collagen vascular disease.   Not valvuloplasty candidate due to MR.   Patient had MV replacement with mechanical MV in 8/11.  Echo (8/11) post-op showed normally functioning mechanical MV, EF 60-65% with septal bounce, small mobile structure in the mid ventricle (? loose papillary muscle), RV-RA gradient 22 mmHg.  Echo (10/11): EF 60-65%, normally functioning mechanical mitral valve.  Echo (7/13): EF 55-60%, mechanical mitral valve with mean gradient 4 mmHg, PA systolic pressure 29 mmHg.  6.  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  CTA chest (7/10) with no evidence for pulmonary emboli or chronic pulmonary emboli.  PA pressure 72/28 mean 50 with PVR 5.7 WU on initial RHC, PA pressure 45/21 mean 30 PVR 2.3 WU on f/u RHC on Revatio 80 mg three times a day (1/11).  After MV surgery, Revatio stopped.  Increased dyspnea.  Repeat RHC with mean RA 6, PA 42/20 (mean 29), PVR 4.8 WU, CI 2.3.  Revatio 20 mg three times a day restarted.  Echo (4/09) with PA systolic pressure 29 mmHg.  - 3/11 6 min walk 427 m - 2/12 6 min walk 427 m - 1/13 6 min walk 411 m - 4/15 6 min walk 448 m 7.  Rheumatic fever 8.  LHC (9/10): no angiographic CAD.  9.  Warfarin anticoagulation for mechanical MV, goal INR 2.5-3.5 10.  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  11. 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 C difficile diarrhea 1/12 13. GERD  Family History: Emphysema  mother, smoker.  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   ROS: All systems reviewed and negative except as noted in HPI.   Current Outpatient Prescriptions  Medication Sig Dispense Refill  . acetaminophen (TYLENOL) 325 MG tablet Take 650 mg by mouth. As directed as needed       . aspirin 81 MG EC tablet Take 81 mg by mouth daily.        . furosemide (LASIX) 20 MG tablet Take 1 tablet (20 mg  total) by mouth daily.      . hydroxychloroquine (PLAQUENIL) 200 MG tablet Take 2 tablets daily      . metoprolol succinate (TOPROL-XL) 25 MG 24 hr tablet TAKE 1 TABLET BY MOUTH EVERY DAY  30 tablet  0  . Multiple Vitamin (MULTIVITAMIN) tablet Take 1 tablet by mouth daily.        Marland Kitchen omeprazole (PRILOSEC OTC) 20 MG tablet Take 1 tablet (20 mg total) by mouth daily.  30 tablet  6  . predniSONE (DELTASONE) 5 MG tablet Take 5 mg by mouth daily.        . sildenafil (REVATIO) 20 MG tablet Take 1 tablet (20 mg total) by mouth 3 (three) times daily.  270 tablet  3  . TraZODone & Diet Manage Prod (TRAZAMINE) 50 MG MISC Take by mouth as needed.      . warfarin (COUMADIN) 5 MG tablet Take as directed by the anticoagulation clinic.  135 tablet  3   No current facility-administered medications for this visit.    BP 110/60  Pulse 78  Ht 5\' 5"  (1.651 m)  Wt 81.194 kg (179 lb)  BMI 29.79 kg/m2 General:  Well developed, well nourished, in no acute distress. Neck:  Neck supple, no JVD. No masses, thyromegaly or abnormal cervical nodes. Lungs:  Slight crackles at bases.  Heart:  Non-displaced PMI,  regular rate and rhythm, S1, S2. mechanical S1.  Carotid upstroke normal, no bruit. Pedals normal pulses. Trace ankle edema.  Abdomen:  Bowel sounds positive; abdomen soft and non-tender without masses, organomegaly, or hernias noted. No hepatosplenomegaly. Extremities:  No clubbing or cyanosis. Neurologic:  Alert and oriented x 3. Psych:  Normal affect.  Assessment/Plan:  1. Status post mechanical MVR:  Mitral valve replacement, stable from valve standpoint.  She is on coumadin goal INR 2.5 - 3.5 and ASA 81 mg daily.  I am going to set her up for repeat echo, will reassess mechical MVR.  Check CBC.  2.  Pulmonary HTN:  Pulmonary HTN is likely a mixed picture, from mitral valve disease as well as mixed connective tissue disease.  She had mild residual pulmonary HTN with moderately increased PVR even after mitral  valve surgery on last RHC though this was improved compared to initial RHC.  The residual pulmonary HTN may be due to pulmonary vascular remodeling with chronic left atrial pressure elevation in the setting of mitral valve disease.  Revatio was restarted at 20 mg tid.  Most recent echo in 7/13 actually did not show significant pulmonary HTN.  6 minute walk today was excellent.  - Repeat echo, as above.  Will need to reassess RV and see if PA pressure can be estimated.  - Continue current Lasix 20 mg daily.  - Check BNP.  3. Mixed connective tissue disorder: Patient has history of pulmonary fibrosis.  Given increased dyspnea recently, she will need followup with pulmonary and evaluation of PFTs.  Larey Dresser 02/07/2014

## 2014-02-07 NOTE — Telephone Encounter (Signed)
Spoke with patient about lab results and starting KCL 40 mEq daily.

## 2014-02-07 NOTE — Telephone Encounter (Signed)
New message ° ° °Patient returning call back to nurse.  °

## 2014-02-08 ENCOUNTER — Ambulatory Visit (INDEPENDENT_AMBULATORY_CARE_PROVIDER_SITE_OTHER): Payer: BC Managed Care – PPO | Admitting: Internal Medicine

## 2014-02-08 DIAGNOSIS — I059 Rheumatic mitral valve disease, unspecified: Secondary | ICD-10-CM

## 2014-02-08 DIAGNOSIS — I2789 Other specified pulmonary heart diseases: Secondary | ICD-10-CM

## 2014-02-08 NOTE — Progress Notes (Signed)
PFT done today. 

## 2014-02-10 ENCOUNTER — Other Ambulatory Visit: Payer: Self-pay | Admitting: Cardiology

## 2014-02-10 LAB — PULMONARY FUNCTION TEST
DL/VA % PRED: 99 %
DL/VA: 4.77 ml/min/mmHg/L
DLCO unc % pred: 47 %
DLCO unc: 11.54 ml/min/mmHg
FEF 25-75 POST: 0.76 L/s
FEF 25-75 Pre: 0.53 L/sec
FEF2575-%Change-Post: 42 %
FEF2575-%PRED-POST: 29 %
FEF2575-%PRED-PRE: 20 %
FEV1-%Change-Post: 10 %
FEV1-%PRED-POST: 37 %
FEV1-%PRED-PRE: 34 %
FEV1-Post: 0.9 L
FEV1-Pre: 0.82 L
FEV1FVC-%CHANGE-POST: 9 %
FEV1FVC-%PRED-PRE: 87 %
FEV6-%CHANGE-POST: 1 %
FEV6-%Pred-Post: 40 %
FEV6-%Pred-Pre: 39 %
FEV6-Post: 1.16 L
FEV6-Pre: 1.14 L
FEV6FVC-%Change-Post: 0 %
FEV6FVC-%Pred-Post: 102 %
FEV6FVC-%Pred-Pre: 101 %
FVC-%CHANGE-POST: 1 %
FVC-%PRED-POST: 39 %
FVC-%Pred-Pre: 38 %
FVC-Post: 1.16 L
FVC-Pre: 1.14 L
POST FEV1/FVC RATIO: 78 %
PRE FEV1/FVC RATIO: 71 %
PRE FEV6/FVC RATIO: 99 %
Post FEV6/FVC ratio: 100 %
RV % PRED: 108 %
RV: 1.88 L
TLC % pred: 60 %
TLC: 3.04 L

## 2014-02-11 ENCOUNTER — Telehealth: Payer: Self-pay | Admitting: Cardiology

## 2014-02-11 ENCOUNTER — Other Ambulatory Visit: Payer: Self-pay | Admitting: *Deleted

## 2014-02-11 DIAGNOSIS — J984 Other disorders of lung: Secondary | ICD-10-CM

## 2014-02-11 DIAGNOSIS — I059 Rheumatic mitral valve disease, unspecified: Secondary | ICD-10-CM

## 2014-02-11 DIAGNOSIS — I2789 Other specified pulmonary heart diseases: Secondary | ICD-10-CM

## 2014-02-11 MED ORDER — FUROSEMIDE 20 MG PO TABS: 20.0000 mg | ORAL_TABLET | Freq: Every day | ORAL | Status: DC

## 2014-02-11 NOTE — Telephone Encounter (Signed)
New message  ° ° °Returning call back to nurse.  °

## 2014-02-11 NOTE — Telephone Encounter (Signed)
Spoke with patient about appt with pulmonary.

## 2014-02-20 ENCOUNTER — Ambulatory Visit (INDEPENDENT_AMBULATORY_CARE_PROVIDER_SITE_OTHER): Payer: BC Managed Care – PPO | Admitting: Internal Medicine

## 2014-02-20 ENCOUNTER — Ambulatory Visit (INDEPENDENT_AMBULATORY_CARE_PROVIDER_SITE_OTHER)
Admission: RE | Admit: 2014-02-20 | Discharge: 2014-02-20 | Disposition: A | Discharge status: Home / Self Care | Payer: BC Managed Care – PPO | Source: Ambulatory Visit | Attending: Internal Medicine | Admitting: Internal Medicine

## 2014-02-20 ENCOUNTER — Encounter: Payer: Self-pay | Admitting: Internal Medicine

## 2014-02-20 VITALS — BP 122/64 | HR 80 | Ht 64.75 in | Wt 177.0 lb

## 2014-02-20 DIAGNOSIS — J8410 Pulmonary fibrosis, unspecified: Secondary | ICD-10-CM

## 2014-02-20 DIAGNOSIS — J9610 Chronic respiratory failure, unspecified whether with hypoxia or hypercapnia: Secondary | ICD-10-CM

## 2014-02-20 MED ORDER — OMEPRAZOLE MAGNESIUM 20 MG PO TBEC: DELAYED_RELEASE_TABLET | ORAL | Status: DC

## 2014-02-20 NOTE — Progress Notes (Signed)
Quick Note:  Spoke with pt and notified of results per Dr. Wert. Pt verbalized understanding and denied any questions.  ______ 

## 2014-02-20 NOTE — Assessment & Plan Note (Addendum)
-   PFT's 03/19/09 VC 1.29   and ratio 72 and  DLCO 26% - PFTs 02/08/14 VC  1.15   And DLCO 47%  - 02/20/2014  Walked RA x 3 laps @ 185 ft each stopped due to  End of study , sats 83 at end ("much faster walking than I normally do")  DDx for pulmonary fibrosis  includes idiopathic pulmonary fibrosis, pulmonary fibrosis associated with rheumatologic diseases (which have a relatively benign course in most cases) , adverse effect from  drugs such as chemotherapy or amiodarone exposure, nonspecific interstitial pneumonia which is typically steroid responsive, and chronic hypersensitivity pneumonitis.   In active  smokers Langerhan's Cell  Histiocyctosis (eosinophilic granuomatosis),  DIP,  and Respiratory Bronchiolitis ILD also need to be considered,    Most likely this is low grade PF related to collagen vasc dz that is well controlled  However, Use of PPI is associated with improved survival time and with decreased radiologic fibrosis per King's study published in AJRCCM vol 184 p1390.  Dec 2011  This may not be cause and effect, but given how universally unhelpful all the otherstudy drugs have been for pf,   rec start  rx ppi / diet/ lifestyle modification and see back in 6 weeks for more "points on the curve"

## 2014-02-20 NOTE — Progress Notes (Signed)
   Subjective:    Patient ID: Katie [name], female    DOB: [birth year] MRN: [id number]  HPI  Katie [name] Rheumatologist Katie [name]  Katie [name]  65 yobf never smoker s/p MVR 05/2010 and breathing better intially  but gradually worse doe x early 2015 referred by [facility] for abn pfts.  02/20/2014 1st [facility] [facility] office visit/ Katie [name]  pred 5 mg daily is her flor Chief Complaint  Patient presents with  . Pulmonary Consult    Referred per Dr. Loralie [name] for poor PFT.  Pt was dx'ed with pulmonary hypertension by Dr. Melvyn [name] in 2009.   onset sob was abrupt in Jan 2015 ? Cold weather, assoc with cough x years dry worse with temp change more day than noct. Does ok indoors slow and flat but can't get in a hurry or do inclines  No obvious other patterns in day to day or daytime variabilty or assoc  cp or chest tightness, subjective wheeze overt sinus or hb symptoms. No unusual exp hx or h/o childhood pna/ asthma or knowledge of premature birth.  Sleeping ok without nocturnal  or early am exacerbation  of respiratory  c/o's or need for noct saba. Also denies any obvious fluctuation of symptoms with weather or environmental changes or other aggravating or alleviating factors except as outlined above   Current Medications, Allergies, Complete Past Medical History, Past Surgical History, Family History, and Social History were reviewed in [employer] record.             Review of Systems  Constitutional: Negative for fever and unexpected weight change.  HENT: Negative for congestion, dental problem, ear pain, nosebleeds, postnasal drip, rhinorrhea, sinus pressure, sneezing, sore throat and trouble swallowing.   Eyes: Negative for redness and itching.  Respiratory: Positive for cough and shortness of breath. Negative for chest tightness and wheezing.   Cardiovascular: Negative for palpitations and leg swelling.  Gastrointestinal: Negative for nausea and  vomiting.  Genitourinary: Negative for dysuria.  Musculoskeletal: Negative for joint swelling.  Skin: Negative for rash.  Neurological: Negative for headaches.  Hematological: Does not bruise/bleed easily.  Psychiatric/Behavioral: Negative for dysphoric mood. The patient is not nervous/anxious.        Objective:   Physical Exam amb bf nad  Wt Readings from Last 3 Encounters:  02/20/14 177 lb (80.287 kg)  02/06/14 179 lb (81.194 kg)  01/01/13 174 lb 12.8 oz (79.289 kg)     ? Scleroderma type facies chronically ill >>> stated age with cough on each insp  HEENT: nl dentition, turbinates, and orophanx. Nl external ear canals without cough reflex   NECK :  without JVD/Nodes/TM/ nl carotid upstrokes bilaterally   LUNGS: no acc muscle use, trace dry crackles both bases on insp   CV:  RRR  no s3 or murmur or increase in P2, no edema   ABD:  soft and nontender with nl excursion in the supine position. No bruits or organomegaly, bowel sounds nl  MS:  warm without deformities, calf tenderness, cyanosis or clubbing  SKIN: warm and dry without lesions    NEURO:  alert, approp, no deficits    CXR  02/20/2014 : Stable areas of scarring/ fibrosis. No edema or consolidation. No change in cardiac silhouette. Stable 5 mm nodular opacity right upper lobe.        Assessment & Plan:

## 2014-02-20 NOTE — Assessment & Plan Note (Signed)
-   02/20/2014  Walked RA x 3 laps @ 185 ft each stopped due to  End of study , sats 83 at end ("much faster walking than I normally do")  Since no desat with her nl speed/ activity level will defer rx for now

## 2014-02-20 NOTE — Progress Notes (Signed)
Quick Note:  LMTCB ______ 

## 2014-02-20 NOTE — Patient Instructions (Signed)
Prilosec 20 mg Take 30- 60 min before your first and last meals of the day automatically until return for re-evaluation  GERD (REFLUX)  is an extremely common cause of respiratory symptoms, many times with no significant heartburn at all.    It can be treated with medication, but also with lifestyle changes including avoidance of late meals, excessive alcohol, smoking cessation, and avoid fatty foods, chocolate, peppermint, colas, red wine, and acidic juices such as orange juice.  NO MINT OR MENTHOL PRODUCTS SO NO COUGH DROPS  USE SUGARLESS CANDY INSTEAD (jolley ranchers or Stover's)  NO OIL BASED VITAMINS - use powdered substitutes.  Please remember to go to the  x-ray department downstairs for your tests - we will call you with the results when they are available.     Please schedule a follow up office visit in 6 weeks, call sooner if needed

## 2014-02-22 ENCOUNTER — Ambulatory Visit (HOSPITAL_COMMUNITY): Payer: BC Managed Care – PPO | Attending: Cardiology | Admitting: Radiology

## 2014-02-22 DIAGNOSIS — R0609 Other forms of dyspnea: Secondary | ICD-10-CM | POA: Insufficient documentation

## 2014-02-22 DIAGNOSIS — I2789 Other specified pulmonary heart diseases: Secondary | ICD-10-CM

## 2014-02-22 DIAGNOSIS — I059 Rheumatic mitral valve disease, unspecified: Secondary | ICD-10-CM

## 2014-02-22 DIAGNOSIS — R0989 Other specified symptoms and signs involving the circulatory and respiratory systems: Principal | ICD-10-CM | POA: Insufficient documentation

## 2014-02-22 DIAGNOSIS — R0602 Shortness of breath: Secondary | ICD-10-CM

## 2014-02-22 NOTE — Progress Notes (Signed)
Echocardiogram performed.  

## 2014-02-25 NOTE — Progress Notes (Signed)
Quick Note:  LMTCB 5/18 ______ 

## 2014-02-26 ENCOUNTER — Telehealth: Payer: Self-pay | Admitting: Cardiology

## 2014-02-26 NOTE — Telephone Encounter (Signed)
Pt is aware of echo results. 

## 2014-02-26 NOTE — Telephone Encounter (Signed)
New problem   Pt returning a call concerning her echo results. Please call pt.

## 2014-04-07 ENCOUNTER — Other Ambulatory Visit: Payer: Self-pay | Admitting: Cardiology

## 2014-04-08 ENCOUNTER — Ambulatory Visit (INDEPENDENT_AMBULATORY_CARE_PROVIDER_SITE_OTHER): Payer: BC Managed Care – PPO | Admitting: Internal Medicine

## 2014-04-08 ENCOUNTER — Encounter: Payer: Self-pay | Admitting: Internal Medicine

## 2014-04-08 VITALS — BP 96/60 | HR 61 | Temp 98.1°F | Ht 64.75 in | Wt 174.0 lb

## 2014-04-08 DIAGNOSIS — J8410 Pulmonary fibrosis, unspecified: Secondary | ICD-10-CM

## 2014-04-08 NOTE — Assessment & Plan Note (Signed)
-   PFT's 03/19/09 VC 1.29   and ratio 72 and  DLCO 26% - PFTs 02/08/14 VC  1.15   And DLCO 47%  - 02/20/2014  Walked RA x 3 laps @ 185 ft each stopped due to  End of study , sats 83 at end ("much faster walking than I normally do") - max gerd rx started 02/20/2014  - 04/08/2014  Walked RA x 3 laps @ 185 ft each stopped due to end of study, no desat   Clearly better though not clear why at this point > continue max gerd rx and return for serial pfts in 6 m    Each maintenance medication was reviewed in detail including most importantly the difference between maintenance and as needed and under what circumstances the prns are to be used.  Please see instructions for details which were reviewed in writing and the patient given a copy.

## 2014-04-08 NOTE — Patient Instructions (Addendum)
No change in recommendations  Please schedule a follow up office visit in 4 months, sooner if needed with full pfts

## 2014-04-08 NOTE — Progress Notes (Signed)
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   History of Present Illness  02/20/2014 1st  Pulmonary office visit/ Katie [name]  pred 5 mg daily is her floor Chief Complaint  Patient presents with  . Pulmonary Consult    Referred per Dr. Loralie Champagne for poor PFT.  Pt was dx'ed with pulmonary hypertension by Dr. Melvyn Novas in 2009.  onset sob was abrupt in Jan 2015 ? Cold weather, assoc with cough x years dry worse with temp change more day than noct. Does ok indoors slow and flat but can't get in a hurry or do inclines rec Prilosec 20 mg Take 30- 60 min before your first and last meals of the day automatically until return for re-evaluation GERD diet    04/08/2014 f/u ov/Arlen Legendre re: chronic cough  Chief Complaint  Patient presents with  . Follow-up    Pt states her cough is better since last visit. No new co's today.     Not limited by breathing from desired activities    No obvious day to day or daytime variabilty or assoc   cp or chest tightness, subjective wheeze overt sinus or hb symptoms. No unusual exp hx or h/o childhood pna/ asthma or knowledge of premature birth.  Sleeping ok without nocturnal  or early am exacerbation  of respiratory  c/o's or need for noct saba. Also denies any obvious fluctuation of symptoms with weather or environmental changes or other aggravating or alleviating factors except as outlined above   Current Medications, Allergies, Complete Past Medical History, Past Surgical History, Family History, and Social History were reviewed in [employer] record.  ROS  The following are not active complaints unless bolded sore throat, dysphagia, dental problems, itching,  sneezing,  nasal congestion or excess/ purulent secretions, ear ache,   fever, chills, sweats, unintended wt loss, pleuritic or exertional cp, hemoptysis,  orthopnea pnd or leg swelling, presyncope, palpitations, heartburn, abdominal pain, anorexia, nausea, vomiting, diarrhea  or change in bowel or urinary habits, change in stools or urine, dysuria,hematuria,  rash, arthralgias, visual complaints, headache, numbness weakness or ataxia or problems with walking or coordination,  change in mood/affect or memory.                 Objective:   Physical Exam   amb bf nad     04/08/2014        174  Wt Readings from Last 3 Encounters:  02/20/14 177 lb (80.287 kg)  02/06/14 179 lb (81.194 kg)  01/01/13 174 lb 12.8 oz (79.289 kg)     ? Scleroderma type facies chronically ill >>> stated age    HEENT: nl dentition, turbinates, and orophanx. Nl external ear canals without cough reflex   NECK :  without JVD/Nodes/TM/ nl carotid upstrokes bilaterally   LUNGS: no acc muscle use, trace dry crackles both bases on insp   CV:  RRR  no s3 or murmur or increase in P2, no edema   ABD:  soft and nontender with nl excursion in the supine position. No bruits or organomegaly, bowel sounds nl  MS:  warm without deformities, calf tenderness, cyanosis or clubbing  SKIN: warm and dry without lesions  NEURO:  alert, approp, no deficits    CXR  02/20/2014 : Stable areas of scarring/ fibrosis. No edema or consolidation. No change in cardiac silhouette. Stable 5 mm nodular opacity right upper lobe.        Assessment & Plan:

## 2014-06-20 ENCOUNTER — Telehealth: Payer: Self-pay | Admitting: Cardiology

## 2014-06-20 NOTE — Telephone Encounter (Signed)
Form left at front desk.

## 2014-06-20 NOTE — Telephone Encounter (Signed)
Spoke with patient.

## 2014-06-20 NOTE — Telephone Encounter (Signed)
New message    Patient calling   Leave form at the desk for her please.

## 2014-06-20 NOTE — Telephone Encounter (Signed)
New message    patient calling regarding a missing form .

## 2014-06-30 ENCOUNTER — Other Ambulatory Visit: Payer: Self-pay | Admitting: Cardiology

## 2014-07-01 ENCOUNTER — Other Ambulatory Visit: Payer: Self-pay | Admitting: Internal Medicine

## 2014-07-07 ENCOUNTER — Other Ambulatory Visit: Payer: Self-pay | Admitting: Internal Medicine

## 2014-08-04 ENCOUNTER — Other Ambulatory Visit: Payer: Self-pay | Admitting: Cardiology

## 2014-08-12 ENCOUNTER — Encounter: Payer: Self-pay | Admitting: Internal Medicine

## 2014-08-15 ENCOUNTER — Other Ambulatory Visit: Payer: Self-pay | Admitting: Internal Medicine

## 2014-08-15 DIAGNOSIS — J8410 Pulmonary fibrosis, unspecified: Secondary | ICD-10-CM

## 2014-08-16 ENCOUNTER — Ambulatory Visit (INDEPENDENT_AMBULATORY_CARE_PROVIDER_SITE_OTHER): Payer: BC Managed Care – PPO | Admitting: Internal Medicine

## 2014-08-16 ENCOUNTER — Encounter: Payer: Self-pay | Admitting: Internal Medicine

## 2014-08-16 ENCOUNTER — Telehealth: Payer: Self-pay | Admitting: Internal Medicine

## 2014-08-16 VITALS — BP 102/60 | HR 95 | Ht 64.0 in | Wt 178.0 lb

## 2014-08-16 DIAGNOSIS — J8410 Pulmonary fibrosis, unspecified: Secondary | ICD-10-CM

## 2014-08-16 DIAGNOSIS — R05 Cough: Secondary | ICD-10-CM

## 2014-08-16 DIAGNOSIS — R058 Other specified cough: Secondary | ICD-10-CM

## 2014-08-16 LAB — PULMONARY FUNCTION TEST
DL/VA % PRED: 104 %
DL/VA: 4.99 ml/min/mmHg/L
DLCO UNC: 11.83 ml/min/mmHg
DLCO unc % pred: 48 %
FEF 25-75 Post: 0.87 L/sec
FEF 25-75 Pre: 0.7 L/sec
FEF2575-%Change-Post: 23 %
FEF2575-%PRED-PRE: 27 %
FEF2575-%Pred-Post: 34 %
FEV1-%Change-Post: 11 %
FEV1-%Pred-Post: 39 %
FEV1-%Pred-Pre: 35 %
FEV1-POST: 0.95 L
FEV1-Pre: 0.85 L
FEV1FVC-%Change-Post: 3 %
FEV1FVC-%Pred-Pre: 89 %
FEV6-%CHANGE-POST: 13 %
FEV6-%PRED-PRE: 38 %
FEV6-%Pred-Post: 43 %
FEV6-POST: 1.25 L
FEV6-Pre: 1.1 L
FEV6FVC-%Change-Post: 0 %
FEV6FVC-%PRED-POST: 101 %
FEV6FVC-%Pred-Pre: 102 %
FVC-%Change-Post: 7 %
FVC-%PRED-POST: 42 %
FVC-%PRED-PRE: 39 %
FVC-PRE: 1.16 L
FVC-Post: 1.25 L
POST FEV6/FVC RATIO: 100 %
Post FEV1/FVC ratio: 76 %
Pre FEV1/FVC ratio: 73 %
Pre FEV6/FVC Ratio: 100 %
RV % PRED: 112 %
RV: 1.96 L
TLC % PRED: 63 %
TLC: 3.23 L

## 2014-08-16 MED ORDER — FAMOTIDINE 20 MG PO TABS: ORAL_TABLET | ORAL | Status: DC

## 2014-08-16 MED ORDER — PANTOPRAZOLE SODIUM 40 MG PO TBEC: 40.0000 mg | DELAYED_RELEASE_TABLET | Freq: Every day | ORAL | Status: DC

## 2014-08-16 NOTE — Telephone Encounter (Signed)
Waiting for fax from [place] for the PA form.

## 2014-08-16 NOTE — Telephone Encounter (Signed)
[id number] x1 

## 2014-08-16 NOTE — Progress Notes (Signed)
PFT done today. 

## 2014-08-16 NOTE — Telephone Encounter (Signed)
Pt returning call.Katie [name] ° °

## 2014-08-16 NOTE — Progress Notes (Signed)
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   History of Present Illness  02/20/2014 1st [place] [facility] office visit/ Joshwa Hemric  pred 5 mg daily is her floor Chief Complaint  Patient presents with  . Pulmonary Consult    Referred per Dr. Loralie Champagne for poor PFT.  Pt was dx'ed with pulmonary hypertension by Dr. Melvyn Novas in 2009.  onset sob was abrupt in Jan 2015 ? Cold weather, assoc with cough x years dry worse with temp change more day than noct. Does ok indoors slow and flat but can't get in a hurry or do inclines rec Prilosec 20 mg Take 30- 60 min before your first and last meals of the day automatically until return for re-evaluation GERD diet    04/08/2014 f/u ov/Reinhold Rickey re: chronic cough  Chief Complaint  Patient presents with  . Follow-up    Pt states her cough is better since last visit. No new co's today.  Not limited by breathing from desired activities   rec No change rx    08/16/2014 f/u ov/Jaymarie Yeakel re: recurrent cough since  off gerd rx esp at hs   Chief Complaint  Patient presents with  . Follow-up    PFT done today. Pt states she is doing well and denies any co's today.  Not limited by breathing from desired activities  Like house work     No obvious day to day or daytime variabilty or assoc excess or purulent or excess mucus   cp or chest tightness, subjective wheeze overt sinus or hb symptoms. No unusual exp hx or h/o childhood pna/ asthma or knowledge of premature birth.    Also denies any obvious fluctuation of symptoms with weather or environmental changes or other aggravating or alleviating factors except as outlined above   Current Medications, Allergies, Complete Past Medical History,  Past Surgical History, Family History, and Social History were reviewed in [employer] record.  ROS  The following are not active complaints unless bolded sore throat, dysphagia, dental problems, itching, sneezing,  nasal congestion or excess/ purulent secretions, ear ache,   fever, chills, sweats, unintended wt loss, pleuritic or exertional cp, hemoptysis,  orthopnea pnd or leg swelling, presyncope, palpitations, heartburn, abdominal pain, anorexia, nausea, vomiting, diarrhea  or change in bowel or urinary habits, change in stools or urine, dysuria,hematuria,  rash, arthralgias, visual complaints, headache, numbness weakness or ataxia or problems with walking or coordination,  change in mood/affect or memory.                 Objective:   Physical Exam   amb bf nad   With freq throat clearing   04/08/2014        174 > 08/16/2014  Wt 178   Wt Readings from Last 3 Encounters:  02/20/14 177 lb (80.287 kg)  02/06/14 179 lb (81.194 kg)  01/01/13 174 lb 12.8 oz (79.289 kg)     ? Scleroderma type facies chronically ill >>> stated age    HEENT: nl dentition, turbinates, and orophanx. Nl external ear canals without cough reflex   NECK :  without JVD/Nodes/TM/ nl carotid upstrokes bilaterally   LUNGS: no acc muscle use, very minimal  crackles both bases on insp   CV:  RRR  no s3 or murmur or increase in P2, no edema   ABD:  soft and nontender with nl excursion in the supine position. No bruits or organomegaly, bowel sounds nl  MS:  warm without deformities, calf tenderness, cyanosis or clubbing  SKIN: warm and dry without lesions    NEURO:  alert, approp, no deficits    CXR  02/20/2014 : Stable areas of scarring/ fibrosis. No edema or consolidation. No change in cardiac silhouette. Stable 5 mm nodular opacity right upper lobe.        Assessment & Plan:

## 2014-08-16 NOTE — Assessment & Plan Note (Signed)
Recurrent cough 08/16/2014 off gerd rx strongly supports  Classic Upper airway cough syndrome, so named because it's frequently impossible to sort out how much is  CR/sinusitis with freq throat clearing (which can be related to primary GERD)   vs  causing  secondary (" extra esophageal")  GERD from wide swings in gastric pressure that occur with throat clearing, often  promoting self use of mint and menthol lozenges that reduce the lower esophageal sphincter tone and exacerbate the problem further in a cyclical fashion.   These are the same pts (now being labeled as having "irritable larynx syndrome" by some cough centers) who not infrequently have a history of having failed to tolerate ace inhibitors,  dry powder inhalers or biphosphonates or report having atypical reflux symptoms that don't respond to standard doses of PPI , and are easily confused as having aecopd or asthma flares by even experienced allergists/ pulmonologists.   rec resume gerd rx and diet

## 2014-08-16 NOTE — Patient Instructions (Addendum)
Pantoprazole (protonix) 40 mg   Take 30-60 min before first meal of the day and Pepcid 20 mg one bedtime until return to office - this is the best way to tell whether stomach acid is contributing to your problem.    Please schedule a follow up visit in 6  months but call sooner if needed

## 2014-08-16 NOTE — Assessment & Plan Note (Signed)
-   PFT's 03/19/09 VC 1.29   and ratio 72 and  DLCO 26% - PFTs 02/08/14 VC  1.15   And DLCO 47%  - PFTs 08/16/2014 VC 1.26 and DLCO 48%  - 02/20/2014  Walked RA x 3 laps @ 185 ft each stopped due to  End of study , sats 83 at end ("much faster walking than I normally do") - max gerd rx started 02/20/2014  - 04/08/2014  Walked RA x 3 laps @ 185 ft each stopped due to end of study, no desat   No evidence dz progression, cough is due to uacs discussed separtely  No changes needed from my perspecitive/ rx underlying dz per Dr Ouida Sills

## 2014-08-19 NOTE — Telephone Encounter (Signed)
Received fax and called [facility] at [phone] Pt ID number [id number] I hung up after holding for 30 min  Will need to try later

## 2014-08-21 NOTE — Telephone Encounter (Signed)
I called the # given and was advised that the PA # is [phone]. I called this # and completed PA. Med was approved to 08-21-15. Pharmacy is aware. [place], [place] ID# [id number].

## 2014-08-29 ENCOUNTER — Telehealth: Payer: Self-pay | Admitting: Cardiology

## 2014-08-29 NOTE — Telephone Encounter (Signed)
Pt advised renewal of permanent disability parking placard application completed by Dr Aundra Dubin.  Pt requested completed application be left at front desk for pick up.  This has been done.

## 2014-08-29 NOTE — Telephone Encounter (Signed)
New Msg  Patient returning call. Please contact about 4:30 pm if possible at [phone].

## 2014-09-16 ENCOUNTER — Other Ambulatory Visit: Payer: Self-pay | Admitting: Cardiology

## 2014-09-18 ENCOUNTER — Telehealth: Payer: Self-pay | Admitting: Cardiology

## 2014-09-18 NOTE — Telephone Encounter (Signed)
Walk In pt Form "Dept Of Transportation" paper Dropped Off gave to [facility]

## 2014-09-18 NOTE — Telephone Encounter (Signed)
Cardiovascular section of DMV form completed by Dr Aundra Dubin and returned to HIM.

## 2014-09-18 NOTE — Telephone Encounter (Signed)
New message      Want the nurse to know she has a form for Dr Aundra Dubin to fill out from the [facility].  She will bring it today.

## 2014-09-19 ENCOUNTER — Telehealth: Payer: Self-pay | Admitting: Cardiology

## 2014-09-19 NOTE — Telephone Encounter (Signed)
Pt aware Dept Of Transportation Form Completed.

## 2014-09-19 NOTE — Telephone Encounter (Signed)
Dept Of Transportation papers ready For pick up Pt aware.[place]

## 2014-10-06 ENCOUNTER — Other Ambulatory Visit: Payer: Self-pay | Admitting: Cardiology

## 2014-10-09 ENCOUNTER — Telehealth: Payer: Self-pay | Admitting: Internal Medicine

## 2014-10-09 NOTE — Telephone Encounter (Signed)
Called and spoke with pt and she is aware of MW recs.  She will try this and if not better she will call back for an appt.

## 2014-10-09 NOTE — Telephone Encounter (Signed)
Nothing I can add at this point except to also try    chlortrimeton (chlorpheniramine) 4 mg every 4 hours available over the counter (may cause drowsiness so take two at bedtime)  If not better rec ov with all meds in hand p holiday weekend

## 2014-10-09 NOTE — Telephone Encounter (Signed)
Called and spoke with pt and she stated that the pepcid is not helping her.  She has been on this for about 1 month and she is still coughing at bedtime and all through the night.  Pt is asking for any further recs from MW.  Please advise. Thanks  Allergies  Allergen Reactions  . Methotrexate     REACTION: intolerance    Current Outpatient Prescriptions on File Prior to Visit  Medication Sig Dispense Refill  . acetaminophen (TYLENOL) 325 MG tablet Take 650 mg by mouth. As directed as needed     . aspirin 81 MG EC tablet Take 81 mg by mouth daily.      . famotidine (PEPCID) 20 MG tablet One at bedtime 30 tablet 11  . furosemide (LASIX) 20 MG tablet TAKE 1 TABLET (20 MG TOTAL) BY MOUTH DAILY. 15 tablet 0  . hydroxychloroquine (PLAQUENIL) 200 MG tablet Take 2 tablets daily    . metoprolol succinate (TOPROL-XL) 25 MG 24 hr tablet TAKE .5 TABLET BY MOUTH EVERY DAY    . metoprolol succinate (TOPROL-XL) 25 MG 24 hr tablet TAKE 1 TABLET BY MOUTH EVERY DAY 30 tablet 0  . Multiple Vitamin (MULTIVITAMIN) tablet Take 1 tablet by mouth daily.      Marland Kitchen omeprazole (PRILOSEC OTC) 20 MG tablet Take 30- 60 min before your first and last meals of the day 60 tablet 11  . pantoprazole (PROTONIX) 40 MG tablet Take 1 tablet (40 mg total) by mouth daily. Take 30-60 min before first meal of the day 30 tablet 11  . potassium chloride SA (KLOR-CON M20) 20 MEQ tablet Take 1 tablet (20 mEq total) by mouth 2 (two) times daily. 60 tablet 6  . predniSONE (DELTASONE) 5 MG tablet Take 5 mg by mouth daily.      . sildenafil (REVATIO) 20 MG tablet Take 1 tablet (20 mg total) by mouth 3 (three) times daily. 270 tablet 3  . warfarin (COUMADIN) 5 MG tablet Take as directed by the anticoagulation clinic. 135 tablet 3   No current facility-administered medications on file prior to visit.

## 2014-10-10 ENCOUNTER — Encounter: Payer: Self-pay | Admitting: Cardiology

## 2014-10-14 ENCOUNTER — Other Ambulatory Visit: Payer: Self-pay | Admitting: Cardiology

## 2014-10-18 ENCOUNTER — Telehealth: Payer: Self-pay | Admitting: Cardiology

## 2014-10-18 NOTE — Telephone Encounter (Signed)
Walk in pt form " [employer]" paperwork dropped off gave to [employer]

## 2014-10-18 NOTE — Telephone Encounter (Signed)
Left pt VM Dept of [place] papers are ready for pick up

## 2014-10-23 ENCOUNTER — Telehealth: Payer: Self-pay | Admitting: Cardiology

## 2014-10-23 NOTE — Telephone Encounter (Signed)
New Message  Pt is experiencing swelling   Pt c/o swelling: STAT is pt has developed SOB within 24 hours  1. How long have you been experiencing swelling? Since inc dose of prednisone (couple of days)  2. Where is the swelling located? Overall; face to ankles  3.  Are you currently taking a "fluid pill"? lasix  4.  Are you currently SOB? no  5.  Have you traveled recently? no    Pt requested to speak w/ Rn. Please call back and discuss.

## 2014-10-23 NOTE — Telephone Encounter (Signed)
Pt notified, verbalized good understanding

## 2014-10-23 NOTE — Telephone Encounter (Signed)
She can increase Lasix to 40 mg daily x 3 days. Increase KCl to 40 qam, 20 qpm while on higher Lasix dose.  Then go back to prior regimen.  Probably needs followup with me sometime soon.

## 2014-10-23 NOTE — Telephone Encounter (Signed)
Follow up      Returning the nurses call.  Please call after 4:30

## 2014-10-23 NOTE — Telephone Encounter (Signed)
LMTCB

## 2014-10-23 NOTE — Telephone Encounter (Signed)
Pt states over the holiday break she had a flare of her lupus and took prednisone.  Pt took her last dose Saturday 10/19/14. Pt's weight is up 7-8 pounds, she has increased swelling mostly in her feet and legs and face.  Pt denies any other symptoms, including shortness of breath.  Pt currently takes lasix 20mg  daily.  Pt asking if she needs to be seen or if she can increase her lasix for a few days to see if this helps.   I will forward to Dr Aundra Dubin for review.

## 2014-10-23 NOTE — Telephone Encounter (Signed)
Follow up ° ° ° ° °Returning a nurses call °

## 2014-10-30 ENCOUNTER — Telehealth: Payer: Self-pay | Admitting: Cardiology

## 2014-10-30 NOTE — Telephone Encounter (Signed)
Pt picked up [place] of Transportation papers

## 2014-11-08 ENCOUNTER — Telehealth: Payer: Self-pay | Admitting: Cardiology

## 2014-11-08 NOTE — Telephone Encounter (Signed)
New message     FYI Application for revatio has been approved.  Good thru 10-11-2015.  Pt will receive 1st order in approx 2 weeks.

## 2014-11-09 NOTE — Telephone Encounter (Signed)
Great.  Have her followup with me in CHF clinic.

## 2014-11-11 NOTE — Telephone Encounter (Signed)
I will forward to [facility] to make arrangements for followup with Dr Aundra Dubin in [place] [facility].

## 2014-11-12 ENCOUNTER — Other Ambulatory Visit: Payer: Self-pay | Admitting: Cardiology

## 2014-11-18 ENCOUNTER — Other Ambulatory Visit: Payer: Self-pay | Admitting: Cardiology

## 2014-11-28 ENCOUNTER — Encounter (HOSPITAL_COMMUNITY): Payer: BC Managed Care – PPO

## 2014-11-29 ENCOUNTER — Encounter (HOSPITAL_COMMUNITY): Payer: Self-pay

## 2014-11-29 ENCOUNTER — Ambulatory Visit (HOSPITAL_COMMUNITY)
Admission: RE | Admit: 2014-11-29 | Discharge: 2014-11-29 | Disposition: A | Discharge status: Home / Self Care | Payer: BC Managed Care – PPO | Source: Ambulatory Visit | Attending: Internal Medicine | Admitting: Internal Medicine

## 2014-11-29 VITALS — BP 103/58 | HR 79 | Resp 18 | Wt 181.5 lb

## 2014-11-29 DIAGNOSIS — I5020 Unspecified systolic (congestive) heart failure: Secondary | ICD-10-CM | POA: Diagnosis present

## 2014-11-29 DIAGNOSIS — I270 Primary pulmonary hypertension: Secondary | ICD-10-CM

## 2014-11-29 DIAGNOSIS — I272 Pulmonary hypertension, unspecified: Secondary | ICD-10-CM

## 2014-11-29 DIAGNOSIS — I050 Rheumatic mitral stenosis: Secondary | ICD-10-CM

## 2014-11-29 LAB — BASIC METABOLIC PANEL
Anion gap: 9 (ref 5–15)
BUN: 8 mg/dL (ref 6–23)
CALCIUM: 8.6 mg/dL (ref 8.4–10.5)
CO2: 26 mmol/L (ref 19–32)
CREATININE: 0.84 mg/dL (ref 0.50–1.10)
Chloride: 105 mmol/L (ref 96–112)
GFR, EST NON AFRICAN AMERICAN: 81 mL/min — AB (ref >90)
Glucose, Bld: 89 mg/dL (ref 70–99)
Potassium: 3.9 mmol/L (ref 3.5–5.1)
Sodium: 140 mmol/L (ref 135–145)

## 2014-11-29 NOTE — Patient Instructions (Signed)
DOing great!  Follow up 6 months.  Do the following things EVERYDAY: 1) Weigh yourself in the morning before breakfast. Write it down and keep it in a log. 2) Take your medicines as prescribed 3) Eat low salt foods-Limit salt (sodium) to 2000 mg per day.  4) Stay as active as you can everyday 5) Limit all fluids for the day to less than 2 liters

## 2014-11-29 NOTE — Progress Notes (Signed)
Patient ID: Katie [name], female   DOB: September 28, 1966, 49 y.o.   MRN: [id number] PCP: Dr. 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    Echo in 2/16 showed normal EF, normal mechanical mitral valve, and PA systolic pressure 32 mmHg.   No dyspnea walking fast or up steps. No difficulty walking on flat ground. No chest pain, no lightheadedness.  No bleeding problems on warfarin. Weight stable.   6 minute walk 421 m (today)  Labs (8/11): HCT 27 => 30, creatinine 0.98, INR 4 Labs (9/11): K 4.6, creatinine 0.9, HCT 32.7, BNP 214=>146, LFTs normal, TSH normal Labs (1/12): HCT 37.9, K 3.5, creatinine 0.9 Labs (4/12): LDL 108, HDL 43, TSH normal, K 4.1, creatinine 0.8, HCT 37.9 Labs (1/13): K 3.8, creatinine 0.8 Labs (11/14): K 3.6, creatinine 0.71, LDL 94, HCT 38 Labs (12/15): Hgb 12.5  Allergies (verified):  1)  ! Methotrexate  Past Medical History: 1. Mixed connective tissue disease: diagnosis 1990. 8/10 ANA positive, anti-dsDNA positive, anti-SCL70 negative 2. 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     - PFT's 03/06/99  VC 47%  DLC0 26%    - PFT's 09/12/08  VC 43%  DLC0 45%    - PFT's 6/10 FVC 37%, FEV1 34%, ratio 69%, TLC 50%    - PFTs 11/15 FVC 39%, FEV1 35%, ratio 89%, TLC 63%, DLCO 48% 4. 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 CHF: Secondary to moderate mitral stenosis and moderate mitral regurgitation in the setting of rheumatic mitral valve disease as well as severe pulmonary HTN likely due to a combination of MV disease and pulmonary arterial HTN in the setting of collagen vascular disease.   Not valvuloplasty candidate due to MR.  Patient had MV replacement with mechanical MV in 8/11.  Echo (8/11)  post-op showed normally functioning mechanical MV, EF 60-65% with septal bounce, small mobile structure in the mid ventricle (? loose papillary muscle), RV-RA gradient 22 mmHg.  Echo (10/11): EF 60-65%, normally functioning mechanical mitral valve.  Echo (7/13): EF 55-60%, mechanical mitral valve with mean gradient 4 mmHg, PA systolic pressure 29 mmHg. Echo (5/15) with EF 55-60%, mechanical mitral valve functioning normally, normal RV size and systolic function, PA systolic pressure 32 mmHg.  6.  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  CTA chest (7/10) with no evidence for pulmonary emboli or chronic pulmonary emboli.  PA pressure 72/28 mean 50 with PVR 5.7 WU on initial RHC, PA pressure 45/21 mean 30 PVR 2.3 WU on f/u RHC on Revatio 80 mg three times a day (1/11).  After MV surgery, Revatio stopped.  Increased dyspnea.  Repeat RHC with mean RA 6, PA 42/20 (mean 29), PVR 4.8 WU, CI 2.3.  Revatio 20 mg three times a day restarted.  Echo (1/19) with PA systolic pressure 29 mmHg. Echo (4/17) with PA systolic pressure 32 mmHg.  - 3/11 6 min walk 427 m - 2/12 6 min walk 427 m - 1/13 6 min walk 411 m - 4/15 6 min walk 448 m - 2/16 6 min walk 421 m 7.  Rheumatic fever 8.  LHC (9/10): no angiographic CAD.  9.  Warfarin anticoagulation for mechanical MV, goal INR 2.5-3.5 10.  Cardiac tamponade  post-MVR (8/11): Pericardial window, c/b spontaneous iliopsoas hematoma.  11. 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 C difficile diarrhea 1/12 13. GERD  Family History: Emphysema mother, smoker.  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   ROS: All systems reviewed and negative except as noted in HPI.   Current Outpatient Prescriptions  Medication Sig Dispense Refill  . acetaminophen (TYLENOL) 325 MG tablet Take 650 mg by mouth. As  directed as needed     . aspirin 81 MG EC tablet Take 81 mg by mouth daily.      . famotidine (PEPCID) 20 MG tablet One at bedtime 30 tablet 11  . furosemide (LASIX) 20 MG tablet TAKE 1 TABLET (20 MG TOTAL) BY MOUTH DAILY. 30 tablet 0  . hydroxychloroquine (PLAQUENIL) 200 MG tablet Take 2 tablets daily    . KLOR-CON M20 20 MEQ tablet TAKE 1 TABLET TWICE DAILY 60 tablet 1  . metoprolol succinate (TOPROL-XL) 25 MG 24 hr tablet TAKE 1 TABLET BY MOUTH EVERY DAY (CALL FOR APPT.) (Patient taking differently: TAKE 1/2 TABLET BY MOUTH EVERY DAY (CALL FOR APPT.)) 30 tablet 0  . Multiple Vitamin (MULTIVITAMIN) tablet Take 1 tablet by mouth daily.      Marland Kitchen omeprazole (PRILOSEC OTC) 20 MG tablet Take 30- 60 min before your first and last meals of the day 60 tablet 11  . pantoprazole (PROTONIX) 40 MG tablet Take 1 tablet (40 mg total) by mouth daily. Take 30-60 min before first meal of the day 30 tablet 11  . predniSONE (DELTASONE) 5 MG tablet Take 5 mg by mouth daily.      . sildenafil (REVATIO) 20 MG tablet Take 1 tablet (20 mg total) by mouth 3 (three) times daily. 270 tablet 3  . warfarin (COUMADIN) 5 MG tablet Take as directed by the anticoagulation clinic. 135 tablet 3   No current facility-administered medications for this encounter.    BP 103/58 mmHg  Pulse 79  Resp 18  Wt 181 lb 8 oz (82.328 kg)  SpO2 99% General:  Well developed, well nourished, in no acute distress. Neck:  Neck supple, no JVD. No masses, thyromegaly or abnormal cervical nodes. Lungs:  Slight crackles at bases.  Heart:  Non-displaced PMI,  regular rate and rhythm, S1, S2. mechanical S1.  Carotid upstroke normal, no bruit. Pedals normal pulses. Trace ankle edema.  Abdomen:  Bowel sounds positive; abdomen soft and non-tender without masses, organomegaly, or hernias noted. No hepatosplenomegaly. Extremities:  No clubbing or cyanosis. Neurologic:  Alert and oriented x 3. Psych:  Normal affect.  Assessment/Plan:  1. Status post  mechanical MVR:  Mitral valve replacement, stable from valve standpoint.  She is on coumadin goal INR 2.5 - 3.5 and ASA 81 mg daily. Echo looked ok in 5/15.  2.  Pulmonary HTN:  Pulmonary HTN is likely a mixed picture, from mitral valve disease as well as mixed connective tissue disease.  She had mild residual pulmonary HTN with moderately increased PVR even after mitral valve surgery on last RHC though this was improved compared to initial RHC.  The residual pulmonary HTN may be due to pulmonary vascular remodeling with chronic left atrial pressure elevation in the setting of mitral valve disease.  Revatio was restarted at 20 mg tid.  Most recent echo in 0/17 showed PA systolic pressure 32 mmHg.  RV looked normal.  6 minute walk today was excellent.   - Continue current Lasix 20  mg daily and check BMET.  3. Mixed connective tissue disorder: Patient has history of pulmonary fibrosis. Followed by pulmonary, PFTs in 11/15 were stable.   Loralie Champagne 11/29/2014

## 2014-11-29 NOTE — Progress Notes (Signed)
6 Minute Walk Start:  100% 84 HR  Patient had brisk, steady gait and pace entirety of walk.  No rest breaks.  No shortness of breath or any or complaints or visible symptoms.  O2 sats and HR hard to assess as patient has severe raynauds and pulse ox had difficulty reading.  End: 99% 94HR   Total distance ambulated: 1380 feet  Dr. Loralie Champagne made aware of results.  Renee Pain

## 2014-12-04 NOTE — Telephone Encounter (Signed)
New message     Patient checking on the status of medical form.

## 2014-12-06 NOTE — Telephone Encounter (Signed)
Follow up     Pt calling to check on status of dmv form she dropped off to the [address] office.  Please call

## 2014-12-06 NOTE — Telephone Encounter (Signed)
Spoke with pt and informed her that Dr. Aundra Dubin and his nurse are out of the office. Informed pt that I was unable to locate papers but that it doesn't mean that they aren't here. Informed pt that I would forward this information to Dr. Claris Gladden nurse for follow up. Pt verbalized understanding and was in agreement with this plan.

## 2014-12-09 ENCOUNTER — Telehealth: Payer: Self-pay | Admitting: Cardiology

## 2014-12-09 NOTE — Telephone Encounter (Signed)
See phone note dated 11/08/14

## 2014-12-09 NOTE — Telephone Encounter (Signed)
New Msg ° ° ° ° ° ° ° ° °Pt returning call from today.  ° ° °Please call back. °

## 2014-12-09 NOTE — Telephone Encounter (Signed)
DMV form hand pt dropped off 12/02/14 hand  delivered to me just now.   I will hold for Dr Aundra Dubin to review.

## 2014-12-09 NOTE — Telephone Encounter (Signed)
DMV form located under Media Tab in Epic dated 10/18/2014.  This is the only DMV form that I am aware of.   LMTCB for pt.

## 2014-12-09 NOTE — Telephone Encounter (Signed)
Pt states that she dropped paperwork from [facility] by [employer] 11/01/14, the Monday after she was seen in [facility].   Pt advised I have not been given any paper work since 11/01/14 and I will forward to HIM to follow up with her about this.

## 2014-12-12 ENCOUNTER — Other Ambulatory Visit: Payer: Self-pay | Admitting: Cardiology

## 2014-12-12 ENCOUNTER — Telehealth: Payer: Self-pay | Admitting: Cardiology

## 2014-12-12 DIAGNOSIS — I5022 Chronic systolic (congestive) heart failure: Secondary | ICD-10-CM

## 2014-12-12 NOTE — Telephone Encounter (Signed)
DMV paper left at front desk for pt pick up.

## 2014-12-12 NOTE — Telephone Encounter (Signed)
DMV form completed by Dr Aundra Dubin, returned to HIM.  HIM will contact pt when form is ready for pick up.

## 2014-12-12 NOTE — Telephone Encounter (Signed)
Left VM for Pt DMV paper ready for pick up.

## 2014-12-24 ENCOUNTER — Other Ambulatory Visit: Payer: Self-pay

## 2014-12-24 DIAGNOSIS — Z1231 Encounter for screening mammogram for malignant neoplasm of breast: Secondary | ICD-10-CM

## 2015-01-08 ENCOUNTER — Other Ambulatory Visit: Payer: Self-pay | Admitting: Cardiology

## 2015-01-08 DIAGNOSIS — I5022 Chronic systolic (congestive) heart failure: Secondary | ICD-10-CM

## 2015-01-20 ENCOUNTER — Telehealth: Payer: Self-pay | Admitting: Cardiology

## 2015-01-20 NOTE — Telephone Encounter (Signed)
Pt requesting Dr Aundra Dubin change AHA Cardiovascular Functional Class from II to I. 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

## 2015-01-20 NOTE — Telephone Encounter (Signed)
AHA Functional Class II  was noted on [place] DOT Medical Report Form dated 10/18/14.   Pt advised I will forward to Dr Aundra Dubin for review.

## 2015-01-20 NOTE — Telephone Encounter (Signed)
New message      Talk to Webb Silversmith regarding a form Dr Aundra Dubin filled out---not a FMLA or disability form.

## 2015-01-20 NOTE — Telephone Encounter (Signed)
Pt advised page 4 of [place] DOT Medical Form has been changed to reflect AHA Class I instead of AHA Class II. This change has been made by Dr Aundra Dubin, initialed and dated today.   Pt requesting page 4 of this form be faxed to [facility] [place] DOT Medical Department at [phone], phone [phone].  Pt advised this document has been forwarded to HIM to be faxed.

## 2015-01-20 NOTE — Telephone Encounter (Signed)
We can change it to 1.  She has been doing fine.

## 2015-01-22 ENCOUNTER — Telehealth: Payer: Self-pay | Admitting: Cardiology

## 2015-01-22 NOTE — Telephone Encounter (Signed)
Department [employer] Transportation paper faxed to [phone] in mailed copy to pt  Per her request.

## 2015-01-25 ENCOUNTER — Other Ambulatory Visit: Payer: Self-pay | Admitting: Cardiology

## 2015-01-29 ENCOUNTER — Ambulatory Visit
Admission: RE | Admit: 2015-01-29 | Discharge: 2015-01-29 | Disposition: A | Discharge status: Home / Self Care | Payer: BC Managed Care – PPO | Source: Ambulatory Visit

## 2015-01-29 DIAGNOSIS — Z1231 Encounter for screening mammogram for malignant neoplasm of breast: Secondary | ICD-10-CM

## 2015-02-04 ENCOUNTER — Other Ambulatory Visit: Payer: Self-pay | Admitting: Obstetrics and Gynecology

## 2015-02-04 DIAGNOSIS — N63 Unspecified lump in unspecified breast: Secondary | ICD-10-CM

## 2015-02-04 DIAGNOSIS — N644 Mastodynia: Secondary | ICD-10-CM

## 2015-02-09 ENCOUNTER — Other Ambulatory Visit: Payer: Self-pay | Admitting: Cardiology

## 2015-02-12 ENCOUNTER — Ambulatory Visit
Admission: RE | Admit: 2015-02-12 | Discharge: 2015-02-12 | Disposition: A | Discharge status: Home / Self Care | Payer: BC Managed Care – PPO | Source: Ambulatory Visit | Attending: Obstetrics and Gynecology | Admitting: Obstetrics and Gynecology

## 2015-02-12 DIAGNOSIS — N63 Unspecified lump in unspecified breast: Secondary | ICD-10-CM

## 2015-02-12 DIAGNOSIS — N644 Mastodynia: Secondary | ICD-10-CM

## 2015-04-22 ENCOUNTER — Telehealth: Payer: Self-pay | Admitting: Cardiology

## 2015-04-22 NOTE — Telephone Encounter (Signed)
Dept Of Transportation form refaxed.

## 2015-04-22 NOTE — Telephone Encounter (Signed)
New message       Dr Aundra Dubin signed a DMV medical form for pt. Page 4 he marked cardiovascular function class #2. Dr Aundra Dubin marked it out and checked #1.  However DMV never received 2 form.  Please refax form (page 4 only) to [phone] Katie [name].  Please call pt when form has been faxed.  Pt said Katie [name] helped her get the first form faxed

## 2015-04-22 NOTE — Telephone Encounter (Signed)
Corrected form scanned in to Epic 01/22/15--forwarded to HIM to help get form to [place].

## 2015-06-02 ENCOUNTER — Encounter (HOSPITAL_COMMUNITY): Payer: Self-pay

## 2015-06-02 ENCOUNTER — Ambulatory Visit (HOSPITAL_COMMUNITY)
Admission: RE | Admit: 2015-06-02 | Discharge: 2015-06-02 | Disposition: A | Discharge status: Home / Self Care | Payer: BC Managed Care – PPO | Source: Ambulatory Visit | Attending: Cardiology | Admitting: Cardiology

## 2015-06-02 VITALS — BP 110/68 | Wt 180.8 lb

## 2015-06-02 DIAGNOSIS — M351 Other overlap syndromes: Secondary | ICD-10-CM | POA: Insufficient documentation

## 2015-06-02 DIAGNOSIS — I272 Other secondary pulmonary hypertension: Secondary | ICD-10-CM | POA: Insufficient documentation

## 2015-06-02 DIAGNOSIS — I5020 Unspecified systolic (congestive) heart failure: Secondary | ICD-10-CM

## 2015-06-02 DIAGNOSIS — Z7982 Long term (current) use of aspirin: Secondary | ICD-10-CM | POA: Diagnosis not present

## 2015-06-02 DIAGNOSIS — I270 Primary pulmonary hypertension: Secondary | ICD-10-CM | POA: Diagnosis not present

## 2015-06-02 DIAGNOSIS — K219 Gastro-esophageal reflux disease without esophagitis: Secondary | ICD-10-CM | POA: Insufficient documentation

## 2015-06-02 DIAGNOSIS — Z952 Presence of prosthetic heart valve: Secondary | ICD-10-CM | POA: Insufficient documentation

## 2015-06-02 DIAGNOSIS — Z825 Family history of asthma and other chronic lower respiratory diseases: Secondary | ICD-10-CM | POA: Insufficient documentation

## 2015-06-02 DIAGNOSIS — Z7901 Long term (current) use of anticoagulants: Secondary | ICD-10-CM | POA: Diagnosis not present

## 2015-06-02 DIAGNOSIS — I059 Rheumatic mitral valve disease, unspecified: Secondary | ICD-10-CM | POA: Diagnosis not present

## 2015-06-02 DIAGNOSIS — M329 Systemic lupus erythematosus, unspecified: Secondary | ICD-10-CM | POA: Insufficient documentation

## 2015-06-02 DIAGNOSIS — J8410 Pulmonary fibrosis, unspecified: Secondary | ICD-10-CM | POA: Diagnosis not present

## 2015-06-02 LAB — CBC
HCT: 38.6 % (ref 36.0–46.0)
Hemoglobin: 12.4 g/dL (ref 12.0–15.0)
MCH: 29.7 pg (ref 26.0–34.0)
MCHC: 32.1 g/dL (ref 30.0–36.0)
MCV: 92.3 fL (ref 78.0–100.0)
PLATELETS: 223 10*3/uL (ref 150–400)
RBC: 4.18 MIL/uL (ref 3.87–5.11)
RDW: 14.4 % (ref 11.5–15.5)
WBC: 5.1 10*3/uL (ref 4.0–10.5)

## 2015-06-02 LAB — BASIC METABOLIC PANEL
ANION GAP: 8 (ref 5–15)
BUN: 13 mg/dL (ref 6–20)
CALCIUM: 8.9 mg/dL (ref 8.9–10.3)
CO2: 28 mmol/L (ref 22–32)
Chloride: 106 mmol/L (ref 101–111)
Creatinine, Ser: 0.87 mg/dL (ref 0.44–1.00)
Glucose, Bld: 90 mg/dL (ref 65–99)
POTASSIUM: 3.9 mmol/L (ref 3.5–5.1)
Sodium: 142 mmol/L (ref 135–145)

## 2015-06-02 NOTE — Progress Notes (Signed)
6 Minute Walk Patient maintained sats 98-100% RA and HR 80-94 Patient ambulated total of 1160 ft (353.568 meters) and tolerated very well, maintaining a brisk pace and steady gait without rest breaks.  Dr. Aundra Dubin made aware.  Renee Pain

## 2015-06-02 NOTE — Progress Notes (Signed)
Patient ID: Katie [name], female   DOB: 1966-01-14, 49 y.o.   MRN: [id number] PCP: Dr. 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    Echo in 2/16 showed normal EF, normal mechanical mitral valve, and PA systolic pressure 32 mmHg.   No dyspnea walking fast or up steps. No difficulty walking on flat ground. No chest pain, no lightheadedness.  No bleeding problems on warfarin. Weight stable. 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    6 minute walk 354 m  Labs (8/11): HCT 27 => 30, creatinine 0.98, INR 4 Labs (9/11): K 4.6, creatinine 0.9, HCT 32.7, BNP 214=>146, LFTs normal, TSH normal Labs (1/12): HCT 37.9, K 3.5, creatinine 0.9 Labs (4/12): LDL 108, HDL 43, TSH normal, K 4.1, creatinine 0.8, HCT 37.9 Labs (1/13): K 3.8, creatinine 0.8 Labs (11/14): K 3.6, creatinine 0.71, LDL 94, HCT 38 Labs (12/15): Hgb 12.5 Labs (2/16): K 3.9, creatinine 0.84  Allergies (verified):  1)  ! Methotrexate  Past Medical History: 1. Mixed connective tissue disease: diagnosis 1990. 8/10 ANA positive, anti-dsDNA positive, anti-SCL70 negative 2. 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     - PFT's 03/06/99  VC 47%  DLC0 26%    - PFT's 09/12/08  VC 43%  DLC0 45%    - PFT's 6/10 FVC 37%, FEV1 34%, ratio 69%, TLC 50%    - PFTs 11/15 FVC 39%, FEV1 35%, ratio 89%, TLC 63%, DLCO 48% 4. 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 CHF: Secondary to moderate mitral stenosis and moderate mitral regurgitation in the setting of rheumatic mitral valve disease as well as severe pulmonary HTN likely due to a combination of MV disease and pulmonary arterial HTN in the setting of collagen vascular disease.   Not valvuloplasty candidate due  to MR.  Patient had MV replacement with mechanical MV in 8/11.  Echo (8/11) post-op showed normally functioning mechanical MV, EF 60-65% with septal bounce, small mobile structure in the mid ventricle (? loose papillary muscle), RV-RA gradient 22 mmHg.  Echo (10/11): EF 60-65%, normally functioning mechanical mitral valve.  Echo (7/13): EF 55-60%, mechanical mitral valve with mean gradient 4 mmHg, PA systolic pressure 29 mmHg. Echo (5/15) with EF 55-60%, mechanical mitral valve functioning normally, normal RV size and systolic function, PA systolic pressure 32 mmHg.  6.  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  CTA chest (7/10) with no evidence for pulmonary emboli or chronic pulmonary emboli.  PA pressure 72/28 mean 50 with PVR 5.7 WU on initial RHC, PA pressure 45/21 mean 30 PVR 2.3 WU on f/u RHC on Revatio 80 mg three times a day (1/11).  After MV surgery, Revatio stopped.  Increased dyspnea.  Repeat RHC with mean RA 6, PA 42/20 (mean 29), PVR 4.8 WU, CI 2.3.  Revatio 20 mg three times a day restarted.  Echo (9/62) with PA systolic pressure 29 mmHg. Echo (9/52) with PA systolic pressure 32 mmHg.  - 3/11 6 min walk 427 m - 2/12 6 min walk 427 m - 1/13 6 min walk 411 m - 4/15 6 min walk 448 m - 2/16 6 min walk 421 m - 8/16 6 min walk 354 m 7.  Rheumatic  fever 8.  LHC (9/10): no angiographic CAD.  9.  Warfarin anticoagulation for mechanical MV, goal INR 2.5-3.5 10.  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  11. 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 C difficile diarrhea 1/12 13. GERD  Family History: Emphysema mother, smoker.  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   ROS: All systems reviewed and negative except as noted in HPI.   Current Outpatient Prescriptions   Medication Sig Dispense Refill  . acetaminophen (TYLENOL) 325 MG tablet Take 650 mg by mouth. As directed as needed     . aspirin 81 MG EC tablet Take 81 mg by mouth daily.      . furosemide (LASIX) 20 MG tablet TAKE 1 TABLET (20 MG TOTAL) BY MOUTH DAILY. 30 tablet 6  . hydroxychloroquine (PLAQUENIL) 200 MG tablet Take 2 tablets daily    . KLOR-CON M20 20 MEQ tablet TAKE 1 TABLET TWICE DAILY 60 tablet 6  . metoprolol succinate (TOPROL-XL) 25 MG 24 hr tablet Take 12.5 mg by mouth daily.    . Multiple Vitamin (MULTIVITAMIN) tablet Take 1 tablet by mouth daily.      . predniSONE (DELTASONE) 5 MG tablet Take 5 mg by mouth daily.      . sildenafil (REVATIO) 20 MG tablet Take 1 tablet (20 mg total) by mouth 3 (three) times daily. 270 tablet 3  . warfarin (COUMADIN) 5 MG tablet Take as directed by the anticoagulation clinic. 135 tablet 3   No current facility-administered medications for this encounter.    BP 110/68 mmHg  Pulse   Wt 180 lb 12 oz (81.988 kg)  SpO2  General:  Well developed, well nourished, in no acute distress. Neck:  Neck supple, no JVD. No masses, thyromegaly or abnormal cervical nodes. Lungs:  Slight crackles at bases.  Heart:  Non-displaced PMI,  regular rate and rhythm, S1, S2. mechanical S1.  Carotid upstroke normal, no bruit. Pedals normal pulses. Trace ankle edema.  Abdomen:  Bowel sounds positive; abdomen soft and non-tender without masses, organomegaly, or hernias noted. No hepatosplenomegaly. Extremities:  No clubbing or cyanosis. Neurologic:  Alert and oriented x 3. Psych:  Normal affect.  Assessment/Plan:  1. Status post mechanical MVR:  Mitral valve replacement, stable from valve standpoint.  She is on coumadin goal INR 2.5 - 3.5 and ASA 81 mg daily. Echo looked ok in 5/15.  Check CBC today.  2.  Pulmonary HTN:  Pulmonary HTN is likely a mixed picture, from mitral valve disease as well as mixed connective tissue disease.  She had mild residual pulmonary HTN with  moderately increased PVR even after mitral valve surgery on last RHC though this was improved compared to initial RHC.  The residual pulmonary HTN may be due to pulmonary vascular remodeling with chronic left atrial pressure elevation in the setting of mitral valve disease.  Revatio was restarted at 20 mg tid.  Most recent echo in 2/50 showed PA systolic pressure 32 mmHg.  RV looked normal.  6 minute walk today was ok but not as good as in the past.  She said that she did not push as hard as she did last night.    - Continue current Lasix 20 mg daily and check BMET.  - Continue current Revatio. - Repeat echo after next appointment.  3. Mixed connective tissue disorder: Patient has history of pulmonary fibrosis. Followed by pulmonary and rheumatology at [facility], PFTs in 11/15 were stable.   Followup in 6 months.   Loralie Champagne 06/02/2015

## 2015-06-02 NOTE — Patient Instructions (Signed)
Routine lab work today. Will notify you of abnormal results, otherwise no news is good news!  Follow up 6 months.  Do the following things EVERYDAY: 1) Weigh yourself in the morning before breakfast. Write it down and keep it in a log. 2) Take your medicines as prescribed 3) Eat low salt foods-Limit salt (sodium) to 2000 mg per day.  4) Stay as active as you can everyday 5) Limit all fluids for the day to less than 2 liters

## 2015-07-11 ENCOUNTER — Other Ambulatory Visit: Payer: Self-pay | Admitting: Internal Medicine

## 2015-08-08 ENCOUNTER — Telehealth (HOSPITAL_COMMUNITY): Payer: Self-pay

## 2015-08-08 NOTE — Telephone Encounter (Signed)
Patient called.  [employer] said they only received front part of form.  Re-faxed both sides of form to [place]

## 2015-08-08 NOTE — Telephone Encounter (Signed)
Confirmed to patient that the Revatio paper work for her medication was faxed into [place] on 08/06/15

## 2015-12-03 ENCOUNTER — Encounter (HOSPITAL_COMMUNITY): Payer: BC Managed Care – PPO

## 2016-01-03 ENCOUNTER — Other Ambulatory Visit: Payer: Self-pay | Admitting: Internal Medicine

## 2016-01-09 LAB — GLUCOSE, POCT (MANUAL RESULT ENTRY): POC Glucose: 38 mg/dl — AB (ref 70–99)

## 2016-01-12 ENCOUNTER — Other Ambulatory Visit: Payer: Self-pay

## 2016-01-12 DIAGNOSIS — Z1231 Encounter for screening mammogram for malignant neoplasm of breast: Secondary | ICD-10-CM

## 2016-01-14 ENCOUNTER — Ambulatory Visit (HOSPITAL_COMMUNITY)
Admission: RE | Admit: 2016-01-14 | Discharge: 2016-01-14 | Disposition: A | Discharge status: Home / Self Care | Payer: BC Managed Care – PPO | Source: Ambulatory Visit | Attending: Cardiology | Admitting: Cardiology

## 2016-01-14 ENCOUNTER — Encounter (HOSPITAL_COMMUNITY): Payer: Self-pay

## 2016-01-14 VITALS — BP 102/60 | HR 88 | Wt 180.8 lb

## 2016-01-14 DIAGNOSIS — Z952 Presence of prosthetic heart valve: Secondary | ICD-10-CM | POA: Diagnosis not present

## 2016-01-14 DIAGNOSIS — Z825 Family history of asthma and other chronic lower respiratory diseases: Secondary | ICD-10-CM | POA: Diagnosis not present

## 2016-01-14 DIAGNOSIS — M329 Systemic lupus erythematosus, unspecified: Secondary | ICD-10-CM | POA: Insufficient documentation

## 2016-01-14 DIAGNOSIS — M351 Other overlap syndromes: Secondary | ICD-10-CM | POA: Insufficient documentation

## 2016-01-14 DIAGNOSIS — I272 Other secondary pulmonary hypertension: Secondary | ICD-10-CM | POA: Diagnosis not present

## 2016-01-14 DIAGNOSIS — Z7982 Long term (current) use of aspirin: Secondary | ICD-10-CM | POA: Diagnosis not present

## 2016-01-14 DIAGNOSIS — I059 Rheumatic mitral valve disease, unspecified: Secondary | ICD-10-CM | POA: Diagnosis not present

## 2016-01-14 DIAGNOSIS — I7300 Raynaud's syndrome without gangrene: Secondary | ICD-10-CM | POA: Diagnosis not present

## 2016-01-14 DIAGNOSIS — Z7901 Long term (current) use of anticoagulants: Secondary | ICD-10-CM | POA: Diagnosis not present

## 2016-01-14 DIAGNOSIS — I5020 Unspecified systolic (congestive) heart failure: Secondary | ICD-10-CM

## 2016-01-14 DIAGNOSIS — Z79899 Other long term (current) drug therapy: Secondary | ICD-10-CM | POA: Insufficient documentation

## 2016-01-14 DIAGNOSIS — K219 Gastro-esophageal reflux disease without esophagitis: Secondary | ICD-10-CM | POA: Insufficient documentation

## 2016-01-14 LAB — LIPID PANEL
Cholesterol: 149 mg/dL (ref 0–200)
HDL: 38 mg/dL — ABNORMAL LOW (ref >40)
LDL CALC: 92 mg/dL (ref 0–99)
Total CHOL/HDL Ratio: 3.9 RATIO
Triglycerides: 93 mg/dL (ref <150)
VLDL: 19 mg/dL (ref 0–40)

## 2016-01-14 LAB — BASIC METABOLIC PANEL
Anion gap: 9 (ref 5–15)
BUN: 7 mg/dL (ref 6–20)
CALCIUM: 8.5 mg/dL — AB (ref 8.9–10.3)
CO2: 27 mmol/L (ref 22–32)
Chloride: 105 mmol/L (ref 101–111)
Creatinine, Ser: 0.88 mg/dL (ref 0.44–1.00)
GFR calc Af Amer: >60 mL/min (ref >60)
GLUCOSE: 74 mg/dL (ref 65–99)
Potassium: 3.6 mmol/L (ref 3.5–5.1)
Sodium: 141 mmol/L (ref 135–145)

## 2016-01-14 LAB — CBC
HCT: 37.4 % (ref 36.0–46.0)
Hemoglobin: 11.7 g/dL — ABNORMAL LOW (ref 12.0–15.0)
MCH: 29 pg (ref 26.0–34.0)
MCHC: 31.3 g/dL (ref 30.0–36.0)
MCV: 92.6 fL (ref 78.0–100.0)
PLATELETS: 175 10*3/uL (ref 150–400)
RBC: 4.04 MIL/uL (ref 3.87–5.11)
RDW: 14.2 % (ref 11.5–15.5)
WBC: 3.7 10*3/uL — ABNORMAL LOW (ref 4.0–10.5)

## 2016-01-14 NOTE — Patient Instructions (Signed)
Routine lab work today. Will notify you of abnormal results  Your provider requests you have an echo.  Follow up in 6 months with Dr.McLean

## 2016-01-14 NOTE — Progress Notes (Signed)
Patient ID: Katie [name], female   DOB: 08-03-1966, 50 y.o.   MRN: [id number] PCP: Dr. 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    Echo in 5/15 showed normal EF, normal mechanical mitral valve, and PA systolic pressure 32 mmHg.   No dyspnea walking fast or up steps. No difficulty walking on flat ground. No chest pain, no lightheadedness.  No bleeding problems on warfarin. Weight stable. She drives a school bus.  She has been doing some walking for exercise.  Weight is stable.   Labs (8/11): HCT 27 => 30, creatinine 0.98, INR 4 Labs (9/11): K 4.6, creatinine 0.9, HCT 32.7, BNP 214=>146, LFTs normal, TSH normal Labs (1/12): HCT 37.9, K 3.5, creatinine 0.9 Labs (4/12): LDL 108, HDL 43, TSH normal, K 4.1, creatinine 0.8, HCT 37.9 Labs (1/13): K 3.8, creatinine 0.8 Labs (11/14): K 3.6, creatinine 0.71, LDL 94, HCT 38 Labs (12/15): Hgb 12.5 Labs (2/16): K 3.9, creatinine 0.84 Labs (8/16): K 3.9, creatinine 0.87, HCT 38.6  Allergies (verified):  1)  ! Methotrexate  Past Medical History: 1. Mixed connective tissue disease: diagnosis 1990. 8/10 ANA positive, anti-dsDNA positive, anti-SCL70 negative 2. 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     - PFT's 03/06/99  VC 47%  DLC0 26%    - PFT's 09/12/08  VC 43%  DLC0 45%    - PFT's 6/10 FVC 37%, FEV1 34%, ratio 69%, TLC 50%    - PFTs 11/15 FVC 39%, FEV1 35%, ratio 89%, TLC 63%, DLCO 48% 4. 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 CHF: Secondary to moderate mitral stenosis and moderate mitral regurgitation in the setting of rheumatic mitral valve disease as well as severe pulmonary HTN likely due to a combination of MV disease and pulmonary arterial HTN in the  setting of collagen vascular disease.   Not valvuloplasty candidate due to MR.  Patient had MV replacement with mechanical MV in 8/11.  Echo (8/11) post-op showed normally functioning mechanical MV, EF 60-65% with septal bounce, small mobile structure in the mid ventricle (? loose papillary muscle), RV-RA gradient 22 mmHg.  Echo (10/11): EF 60-65%, normally functioning mechanical mitral valve.  Echo (7/13): EF 55-60%, mechanical mitral valve with mean gradient 4 mmHg, PA systolic pressure 29 mmHg. Echo (5/15) with EF 55-60%, mechanical mitral valve functioning normally, normal RV size and systolic function, PA systolic pressure 32 mmHg.  6.  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  CTA chest (7/10) with no evidence for pulmonary emboli or chronic pulmonary emboli.  PA pressure 72/28 mean 50 with PVR 5.7 WU on initial RHC, PA pressure 45/21 mean 30 PVR 2.3 WU on f/u RHC on Revatio 80 mg three times a day (1/11).  After MV surgery, Revatio stopped.  Increased dyspnea.  Repeat RHC with mean RA 6, PA 42/20 (mean 29), PVR 4.8 WU, CI 2.3.  Revatio 20 mg three times a day restarted.  Echo (0000000) with PA systolic pressure 29 mmHg. Echo (123456) with PA systolic pressure 32 mmHg.  - 3/11 6 min walk 427 m - 2/12 6 min walk 427 m - 1/13 6 min walk 411 m - 4/15 6 min walk 448 m - 2/16 6 min walk 421 m -  8/16 6 min walk 354 m 7.  Rheumatic fever 8.  LHC (9/10): no angiographic CAD.  9.  Warfarin anticoagulation for mechanical MV, goal INR 2.5-3.5 10.  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  11. 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 C difficile diarrhea 1/12 13. GERD  Family History: Emphysema mother, smoker.  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   ROS: All systems reviewed  and negative except as noted in HPI.   Current Outpatient Prescriptions  Medication Sig Dispense Refill  . acetaminophen (TYLENOL) 325 MG tablet Take 650 mg by mouth. As directed as needed     . aspirin 81 MG EC tablet Take 81 mg by mouth daily.      . furosemide (LASIX) 20 MG tablet TAKE 1 TABLET (20 MG TOTAL) BY MOUTH DAILY. 30 tablet 6  . hydroxychloroquine (PLAQUENIL) 200 MG tablet Take 2 tablets daily    . KLOR-CON M20 20 MEQ tablet TAKE 1 TABLET TWICE DAILY 60 tablet 6  . metoprolol succinate (TOPROL-XL) 25 MG 24 hr tablet Take 12.5 mg by mouth daily.    . Multiple Vitamin (MULTIVITAMIN) tablet Take 1 tablet by mouth daily.      . predniSONE (DELTASONE) 5 MG tablet Take 5 mg by mouth daily.      . sildenafil (REVATIO) 20 MG tablet Take 1 tablet (20 mg total) by mouth 3 (three) times daily. 270 tablet 3  . warfarin (COUMADIN) 5 MG tablet Take as directed by the anticoagulation clinic. 135 tablet 3   No current facility-administered medications for this encounter.    BP 102/60 mmHg  Pulse 88  Wt 180 lb 12 oz (81.988 kg)  SpO2 97% General:  Well developed, well nourished, in no acute distress. Neck:  Neck supple, no JVD. No masses, thyromegaly or abnormal cervical nodes. Lungs:  Slight crackles at bases.  Heart:  Non-displaced PMI,  regular rate and rhythm, S1, S2. mechanical S1.  Carotid upstroke normal, no bruit. Pedals normal pulses. Trace ankle edema.  Abdomen:  Bowel sounds positive; abdomen soft and non-tender without masses, organomegaly, or hernias noted. No hepatosplenomegaly. Extremities:  No clubbing or cyanosis. Neurologic:  Alert and oriented x 3. Psych:  Normal affect.  Assessment/Plan:  1. Status post mechanical MVR:  Mitral valve replacement, stable from valve standpoint.  She is on coumadin goal INR 2.5 - 3.5 and ASA 81 mg daily. Check CBC today. I will arrange for echo to look at mitral valve and to estimate PA pressure.  2.  Pulmonary HTN:  Pulmonary HTN is  likely a mixed picture, from mitral valve disease as well as mixed connective tissue disease.  She had mild residual pulmonary HTN with moderately increased PVR even after mitral valve surgery on last RHC though this was improved compared to initial RHC.  The residual pulmonary HTN may be due to pulmonary vascular remodeling with chronic left atrial pressure elevation in the setting of mitral valve disease.  Revatio was restarted at 20 mg tid.  Most recent echo in [id number] showed PA systolic pressure 32 mmHg.  RV looked normal.   - Continue current Lasix 20 mg daily and check BMET.  - Continue current Revatio. - I will arrange for echo to reassess RV and PA pressure.  3. Mixed connective tissue disorder: Patient has history of pulmonary fibrosis. Followed by pulmonary and rheumatology at [facility], PFTs in 11/15 were stable.  Followup in 6 months.   Loralie Champagne 01/14/2016

## 2016-01-16 ENCOUNTER — Ambulatory Visit (HOSPITAL_COMMUNITY)
Admission: RE | Admit: 2016-01-16 | Discharge: 2016-01-16 | Disposition: A | Discharge status: Home / Self Care | Payer: BC Managed Care – PPO | Source: Ambulatory Visit | Attending: Cardiology | Admitting: Cardiology

## 2016-01-16 DIAGNOSIS — Z029 Encounter for administrative examinations, unspecified: Secondary | ICD-10-CM | POA: Insufficient documentation

## 2016-01-28 ENCOUNTER — Ambulatory Visit
Admission: RE | Admit: 2016-01-28 | Discharge: 2016-01-28 | Disposition: A | Discharge status: Home / Self Care | Payer: BC Managed Care – PPO | Source: Ambulatory Visit

## 2016-01-28 DIAGNOSIS — Z1231 Encounter for screening mammogram for malignant neoplasm of breast: Secondary | ICD-10-CM

## 2016-02-16 NOTE — Addendum Note (Signed)
Encounter addended by: Scarlette Calico, RN on: 02/16/2016  9:06 AM<BR>     Documentation filed: [employer], Orders

## 2016-02-24 ENCOUNTER — Ambulatory Visit (HOSPITAL_COMMUNITY)
Admission: EM | Admit: 2016-02-24 | Discharge: 2016-02-24 | Disposition: A | Discharge status: Home / Self Care | Payer: Worker's Compensation | Attending: Family Medicine | Admitting: Family Medicine

## 2016-02-24 ENCOUNTER — Ambulatory Visit (INDEPENDENT_AMBULATORY_CARE_PROVIDER_SITE_OTHER): Payer: Worker's Compensation

## 2016-02-24 ENCOUNTER — Other Ambulatory Visit: Payer: Self-pay | Admitting: Internal Medicine

## 2016-02-24 ENCOUNTER — Encounter (HOSPITAL_COMMUNITY): Payer: Self-pay | Admitting: *Deleted

## 2016-02-24 DIAGNOSIS — S63602A Unspecified sprain of left thumb, initial encounter: Secondary | ICD-10-CM | POA: Diagnosis not present

## 2016-02-24 MED ORDER — TRAMADOL HCL 50 MG PO TABS: 50.0000 mg | ORAL_TABLET | Freq: Four times a day (QID) | ORAL | Status: DC | PRN

## 2016-02-24 NOTE — Discharge Instructions (Signed)
Wear splint for comfort, use pain medicine as needed, see dr Amedeo Plenty if further problems.

## 2016-02-24 NOTE — ED Notes (Signed)
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
## 2016-03-03 ENCOUNTER — Ambulatory Visit (HOSPITAL_COMMUNITY): Payer: BC Managed Care – PPO | Attending: Cardiovascular Disease

## 2016-03-03 ENCOUNTER — Other Ambulatory Visit: Payer: Self-pay

## 2016-03-03 DIAGNOSIS — Z952 Presence of prosthetic heart valve: Secondary | ICD-10-CM | POA: Insufficient documentation

## 2016-03-03 DIAGNOSIS — I071 Rheumatic tricuspid insufficiency: Secondary | ICD-10-CM | POA: Diagnosis not present

## 2016-03-03 DIAGNOSIS — I272 Other secondary pulmonary hypertension: Secondary | ICD-10-CM | POA: Diagnosis not present

## 2016-03-03 DIAGNOSIS — I517 Cardiomegaly: Secondary | ICD-10-CM | POA: Diagnosis not present

## 2016-03-03 DIAGNOSIS — I313 Pericardial effusion (noninflammatory): Secondary | ICD-10-CM | POA: Diagnosis not present

## 2016-03-12 ENCOUNTER — Telehealth (HOSPITAL_COMMUNITY): Payer: Self-pay

## 2016-03-12 NOTE — Telephone Encounter (Signed)
Patient aware of echo results, no questions or concerns at this time.

## 2016-04-04 ENCOUNTER — Other Ambulatory Visit: Payer: Self-pay | Admitting: Internal Medicine

## 2016-04-12 ENCOUNTER — Other Ambulatory Visit (HOSPITAL_COMMUNITY): Payer: Self-pay | Admitting: Cardiology

## 2016-04-12 MED ORDER — METOPROLOL SUCCINATE ER 25 MG PO TB24: 12.5000 mg | ORAL_TABLET | Freq: Every day | ORAL | Status: DC

## 2016-04-12 MED ORDER — POTASSIUM CHLORIDE CRYS ER 20 MEQ PO TBCR: 20.0000 meq | EXTENDED_RELEASE_TABLET | Freq: Two times a day (BID) | ORAL | Status: DC

## 2016-04-12 MED ORDER — FUROSEMIDE 20 MG PO TABS: ORAL_TABLET | ORAL | Status: DC

## 2016-04-22 ENCOUNTER — Ambulatory Visit (INDEPENDENT_AMBULATORY_CARE_PROVIDER_SITE_OTHER): Payer: BC Managed Care – PPO | Admitting: Neurology

## 2016-04-22 ENCOUNTER — Encounter: Payer: Self-pay | Admitting: Neurology

## 2016-04-22 VITALS — BP 110/64 | HR 68 | Resp 20 | Ht 64.0 in | Wt 178.0 lb

## 2016-04-22 DIAGNOSIS — IMO0001 Reserved for inherently not codable concepts without codable children: Secondary | ICD-10-CM

## 2016-04-22 DIAGNOSIS — R569 Unspecified convulsions: Secondary | ICD-10-CM

## 2016-04-22 DIAGNOSIS — I272 Other secondary pulmonary hypertension: Secondary | ICD-10-CM

## 2016-04-22 DIAGNOSIS — R6889 Other general symptoms and signs: Secondary | ICD-10-CM

## 2016-04-22 DIAGNOSIS — I314 Cardiac tamponade: Secondary | ICD-10-CM | POA: Diagnosis not present

## 2016-04-22 DIAGNOSIS — G4700 Insomnia, unspecified: Secondary | ICD-10-CM | POA: Diagnosis not present

## 2016-04-22 DIAGNOSIS — D6862 Lupus anticoagulant syndrome: Secondary | ICD-10-CM

## 2016-04-22 DIAGNOSIS — Z952 Presence of prosthetic heart valve: Secondary | ICD-10-CM

## 2016-04-22 DIAGNOSIS — G4730 Sleep apnea, unspecified: Secondary | ICD-10-CM

## 2016-04-22 DIAGNOSIS — I2722 Pulmonary hypertension due to left heart disease: Principal | ICD-10-CM

## 2016-04-22 DIAGNOSIS — R0902 Hypoxemia: Secondary | ICD-10-CM | POA: Insufficient documentation

## 2016-04-22 DIAGNOSIS — I5022 Chronic systolic (congestive) heart failure: Secondary | ICD-10-CM | POA: Insufficient documentation

## 2016-04-22 DIAGNOSIS — I313 Pericardial effusion (noninflammatory): Secondary | ICD-10-CM | POA: Insufficient documentation

## 2016-04-22 DIAGNOSIS — I059 Rheumatic mitral valve disease, unspecified: Secondary | ICD-10-CM | POA: Insufficient documentation

## 2016-04-22 DIAGNOSIS — Z954 Presence of other heart-valve replacement: Secondary | ICD-10-CM

## 2016-04-22 DIAGNOSIS — I319 Disease of pericardium, unspecified: Secondary | ICD-10-CM

## 2016-04-22 NOTE — Progress Notes (Signed)
SLEEP MEDICINE CLINIC   Provider:  Larey Seat, M D  Referring Provider: Thressa Sheller, MD Primary Care Physician:  Thressa Sheller, MD  Chief Complaint  Patient presents with  . New Patient (Initial Visit)    insomnia, never had sleep study    HPI:  Katie [name] is a 50 y.o. female , seen here as a referral from Katie Marble, MD, Dr. Manfred Shirts referred this patient because of excessive daytime fatigue and dizziness feeling tired all the time. The patient has an extensive past medical history she underwent a mitral valve replacement and developed a cardiac tamponade, she carries a diagnosis of congestive heart failure, pulmonary hypertension due to high resistance, Raynaud's syndrome, systemic lupus erythematosus with pulmonary lupus, peripheral artery obstruction disease, vascular necrosis of the bones, she was referred by Dr. Thressa Sheller her primary care physician to Dr. Erik Obey.  She begun feeling in May 2017 episodes of sudden onset of dizziness, described as a lightheadedness and followed by a sudden feeling of severe tiredness and fatigue. She could just fall asleep she felt so fatigued and let, heavy without any strength left in her. The episodes lasted less than 30 seconds that could happen several time each day. 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 She has no history of traumatic brain injury in also throat trauma or previous strokes. There is no vestibulitis present or symptom of vestibulitis she never had a spinning sensation or the feeling of being in motion and actually at rest. The spells only happen when she drives. Mornings and afternoons. Unrelated to food intake times or sleep.  Insomnia since 1990 , ever since she started on prednisone for Lupus.    Sleep habits are as follows: She usually goes to bed between 10:30 and 11 PM she lives with her son. After she goes to bed it takes her about 90 minutes 220  minutes to go to sleep. She keeps her bedroom cool, quiet and dark, she sleeps on 2 pillows she usually prefers to sleep prone. She does not suffer shortness of breath at night, palpitations have never woken her from her sleep is fragmented at least twice at night when she has the urge to urinate since she is on diuretics. It is very difficult for her to go back to sleep after these interruptions. Her total sleep time may be as little as 4 hours at night. There is no witness to her sleep pattern and her son cannot  state if she snores or if she has apnea. She frequently wakes up with headaches but the headaches do not wake her, they do not have an episodic cluster character. She has to arise at 5 former work as a Teacher, early years/pre but she usually is awake long before that. She does not wake up with a dry mouth but has headaches, she has not woken up with unexplained injuries  Or bruises, incontinence or tongue bite. She recalls dreaming, vividly.   Sleep medical history and family sleep history:  She has no history of sleepwalking, enuresis, night terrors.  Social history: [name] is single, she is a 49-year-old son and works as a Teacher, early years/pre. She was diagnosed at age 10 with lupus. Her lupus did not flareup in her late pregnancy. 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   Review of Systems: Out of a complete 14 system review, the patient complains of only the following  symptoms, and all other reviewed systems are negative.   Insomnia, sleep deprivation, School bus driver, morning headaches, discoid lupus, pulmonary restistancy with pulmonary hypertension, spells.  Epworth score 7 , Fatigue severity score 63  , depression score 2/15    Social History   Social History  . Marital Status: Single    Spouse Name: N/A  . Number of Children: N/A  . Years of Education: N/A   Occupational History  . Not on file.   Social History Main Topics  . Smoking  status: Never Smoker   . Smokeless tobacco: Never Used  . Alcohol Use: No  . Drug Use: No  . Sexual Activity: Not Currently    Birth Control/ Protection: Other-see comments     Comment: BTL   Other Topics Concern  . Not on file   Social History Narrative   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     Family History  Problem Relation Age of Onset  . Emphysema Mother     smoker  . COPD Mother   . Heart disease Maternal Grandmother     side of family not given  . Diabetes Maternal Grandmother   . Hypertension Father     Past Medical History  Diagnosis Date  . Mixed connective tissue disease ([place])     diagnosis 1990. 8/10 ANA positive, anti-dDNA positive, anti-SCL70 negative  . Raynaud's syndrome   . Restrictive lung disease     secondary to MCTD. PFTs 03/06/99 VC 47% DLCO 26%. PFTs 09/12/08 VC 43% DLCO 45%. PFTs 6/10 FVC 37%, FEV1 34%, ratio 69%, TLC 50%. using home O2 periodically  . Nephrotic syndrome     secondary to SLE  . CHF (congestive heart failure) (HCC)     secondary to mmoderate mitral stenosis and moderate mitral regurgitation in the setting of rheumatic vlave disease as well as severe pulmonary HTN likely due to a combination of MV disease and pulmoanary arterial HTN in the setting of collagen vascular disease. Not valvuloplasty candidate due to MR. patient had MV replacement with mechanical MV in 8/11.  Marland Kitchen CHF (congestive heart failure) (HCC)     Echo post-op showed normally functioning mechanical MV, EF 60-65% with septal bounce, small mobile structure in the mid ventricle (? loose papillary muscle), RV-RA gradient 22 mmHg. Echo (10/11): EF 60-65%, normally functioning mechanical mitral vlavue.   . Pulmonary HTN ([place])     porbably secondary to combination of rheumatic MV disease and pulmonary arterial HYN form vollagen vascalr disease. CTA chest (7/10) with no evidence for pulmonary emboli or chronic pulmonary emboli. PA pressure 72/28 mean 50  with PVR 5.7 WU on initial RHC, PA pressure 45/21 mean 30 PVR 2.3 WU on f/u RHC on Revatio 80 mg three times a day (1/11).  After MV surgery, Revatio stopped.  . Pulmonary HTN ([place])     increased  dyspnea. Repeat RHC with mean RA 6, PA 42/20 mean 29, PVR 4.8 WU,  2.3. Revatio 20 mg  3x a day restarted. 3/11 6 min walk 475m. 2/12 6 min walk 427 m.   . Rheumatic fever   . CAD (coronary artery disease)     LHC (9/10): no angiographic CAD  . Warfarin anticoagulation     for mechanical MV, goal INR 2.5-3.5  . Cardiac tamponade     post-MVR (8/11): pericardial window, c/b sponatneous iliopsoas hematoma.   Marland Kitchen Abscess of right breast     sternal osteomyeliitis, pseudomonas  . C.  difficile diarrhea 1/12    Past Surgical History  Procedure Laterality Date  . S/p mitral valvue replacement  05/13/2010    by Dr. Roxy Manns by minimally invasive window.   . Cesarean section    . Tubal ligation      Current Outpatient Prescriptions  Medication Sig Dispense Refill  . acetaminophen (TYLENOL) 325 MG tablet Take 650 mg by mouth. As directed as needed     . aspirin 81 MG EC tablet Take 81 mg by mouth daily.      . Calcium Citrate-Vitamin D (CALCIUM + D PO) Take by mouth.    . furosemide (LASIX) 20 MG tablet TAKE 1 TABLET (20 MG TOTAL) BY MOUTH DAILY. 30 tablet 6  . hydroxychloroquine (PLAQUENIL) 200 MG tablet Take 2 tablets daily    . metoprolol succinate (TOPROL-XL) 25 MG 24 hr tablet Take 0.5 tablets (12.5 mg total) by mouth daily. 15 tablet 3  . Multiple Vitamin (MULTIVITAMIN) tablet Take 1 tablet by mouth daily.      . potassium chloride SA (KLOR-CON M20) 20 MEQ tablet Take 1 tablet (20 mEq total) by mouth 2 (two) times daily. 60 tablet 2  . predniSONE (DELTASONE) 5 MG tablet Take 5 mg by mouth daily.      . sildenafil (REVATIO) 20 MG tablet Take 1 tablet (20 mg total) by mouth 3 (three) times daily. 270 tablet 3  . Vitamin D, Cholecalciferol, 400 units CAPS Take by mouth.    . warfarin (COUMADIN) 5 MG  tablet Take as directed by the anticoagulation clinic. 135 tablet 3  . traMADol (ULTRAM) 50 MG tablet Take 1 tablet (50 mg total) by mouth every 6 (six) hours as needed for moderate pain. (Patient not taking: Reported on 04/22/2016) 15 tablet 0   No current facility-administered medications for this visit.    Allergies as of 04/22/2016 - Review Complete 04/22/2016  Allergen Reaction Noted  . Cephalexin Other (See Comments) 04/22/2016  . Methotrexate      Vitals: BP 110/64 mmHg  Pulse 68  Resp 20  Ht 5\' 4"  (1.626 m)  Wt 178 lb (80.74 kg)  BMI 30.54 kg/m2 Last Weight:  Wt Readings from Last 1 Encounters:  04/22/16 178 lb (80.74 kg)   [id number] mass index is 30.54 kg/(m^2).     Last Height:   Ht Readings from Last 1 Encounters:  04/22/16 5\' 4"  (1.626 m)    Physical exam:  General: The patient is awake, alert and appears not in acute distress. The patient is well groomed. Head: Normocephalic, atraumatic. Neck is supple. Mallampati 2,  neck circumference: 13.5. Nasal airflow patent , TMJ is is  evident , restricted mouth opening. . Retrognathia is not seen.  Cardiovascular:  Regular rate and rhythm, without  murmurs or carotid bruit, and without distended neck veins. Respiratory: Lungs are clear to auscultation. Skin:  Without evidence of edema, or rash- facial scarring form lupus discoides. Trunk:  The patient's posture is erect    Neurologic exam : The patient is awake and alert, oriented to place and time.   Attention span & concentration ability appears normal.  Speech is fluent,  with Dysarthria and dysphonia not  aphasia.  Her upper lip is stiffened by lupoidchanges  Mood and affect are appropriate.  Cranial nerves: Pupils are equal and briskly reactive to light. Funduscopic exam without evidence of pallor or edema.  Extraocular movements  in vertical and horizontal planes intact and without nystagmus. Visual fields by finger perimetry are intact. Hearing  to finger rub  intact.   Facial sensation intact to fine touch.  Facial motor strength is symmetric -and tongue and uvula move midline. Shoulder shrug was symmetrical.   Motor exam:  Normal tone, muscle bulk and symmetric strength in all extremities. Sensory:  Fine touch, pinprick and vibration were tested in all extremities. Proprioception tested in the upper extremities was normal. Coordination: Rapid alternating movements in the fingers/hands was normal. Finger-to-nose maneuver  normal without evidence of ataxia, dysmetria or tremor. Gait and station: Patient walks without assistive device and is able unassisted to climb up to the exam table. Strength within normal limits.  Stance is stable and normal.  Deep tendon reflexes: in the  upper and lower extremities are symmetric and intact. Babinski maneuver response is downgoing.  The patient was advised of the nature of the diagnosed sleep disorder , the treatment options and risks for general a health and wellness arising from not treating the condition.  I spent more than 45 minutes of face to face time with the patient. Greater than 50% of time was spent in counseling and coordination of care. We have discussed the diagnosis and differential and I answered the patient's questions.     Assessment:  After physical and neurologic examination, review of laboratory studies,  Personal review of imaging studies, reports of other /same  Imaging studies ,  Results of polysomnography/ neurophysiology testing and pre-existing records as far as provided in visit., my assessment is   1) Mrs. Sumption spells could indeed be related to sleep deprivation and sleep attacks related. I would like to evaluate her and an attended sleep study to see if she has #1 apnea which could also cause some of her insomnia him a #2 organic reasons for insomnia #3 hypercapnia #4 hypoxemia. A sleep study will evaluate for all these possibilities.   Her spells  were clearly non-vertigenous, but we  should rule out TIAs or seizures.  I would like to evaluate her with an EEG.  In addition, I will asked Dr. Loralie Champagne her cardiologist to share with me any recent echocardiogram, carotid Doppler studies. I would like to have his input as I consider an emboli monitoring study for this patient.   Plan:  Treatment plan and additional workup :  Split night polysomnography with capnography, split at AHI 15, 3% desaturation. Insomnia evaluation in the sleep lab cannot be successful in a patient with trouble initiating sleep for a long time. I would actually prescribed the patient as sleep aid to that she can get to sleep while observed in the sleep lab. Emboli monititoring to be performed at [facility] , with Cardiology input.  Artificial Hear valve ( mitralis) will not allow MRI.   Asencion Partridge Jaina Morin MD  [id number]   CC: Katie Marble, MD - [facility]NT   CC: Loralie Champagne   CC; Mallie Mussel, MDcardiac thoracic surgery    Thressa Sheller, [place], [place], [place] [address]

## 2016-05-10 ENCOUNTER — Encounter (HOSPITAL_COMMUNITY): Payer: Self-pay

## 2016-05-10 NOTE — Progress Notes (Signed)
Received confirmation that patient's revatio shipment was fulfilled to patient's home 05/10/2016 with quantity of 90 tabs.

## 2016-05-20 ENCOUNTER — Ambulatory Visit (INDEPENDENT_AMBULATORY_CARE_PROVIDER_SITE_OTHER): Payer: BC Managed Care – PPO | Admitting: Neurology

## 2016-05-20 DIAGNOSIS — D6862 Lupus anticoagulant syndrome: Secondary | ICD-10-CM

## 2016-05-20 DIAGNOSIS — I059 Rheumatic mitral valve disease, unspecified: Secondary | ICD-10-CM

## 2016-05-20 DIAGNOSIS — I2722 Pulmonary hypertension due to left heart disease: Secondary | ICD-10-CM

## 2016-05-20 DIAGNOSIS — G4730 Sleep apnea, unspecified: Secondary | ICD-10-CM

## 2016-05-20 DIAGNOSIS — G4700 Insomnia, unspecified: Secondary | ICD-10-CM

## 2016-05-20 DIAGNOSIS — R0902 Hypoxemia: Secondary | ICD-10-CM

## 2016-05-20 DIAGNOSIS — Z952 Presence of prosthetic heart valve: Secondary | ICD-10-CM

## 2016-05-27 ENCOUNTER — Ambulatory Visit (INDEPENDENT_AMBULATORY_CARE_PROVIDER_SITE_OTHER): Payer: BC Managed Care – PPO | Admitting: Neurology

## 2016-05-27 DIAGNOSIS — R569 Unspecified convulsions: Secondary | ICD-10-CM

## 2016-05-27 DIAGNOSIS — G4700 Insomnia, unspecified: Secondary | ICD-10-CM

## 2016-05-27 DIAGNOSIS — D6862 Lupus anticoagulant syndrome: Secondary | ICD-10-CM

## 2016-05-27 DIAGNOSIS — I2722 Pulmonary hypertension due to left heart disease: Secondary | ICD-10-CM

## 2016-05-27 DIAGNOSIS — R6889 Other general symptoms and signs: Secondary | ICD-10-CM

## 2016-05-27 DIAGNOSIS — Z952 Presence of prosthetic heart valve: Secondary | ICD-10-CM

## 2016-05-27 DIAGNOSIS — R0902 Hypoxemia: Secondary | ICD-10-CM

## 2016-05-27 DIAGNOSIS — G4730 Sleep apnea, unspecified: Principal | ICD-10-CM

## 2016-05-27 DIAGNOSIS — IMO0001 Reserved for inherently not codable concepts without codable children: Secondary | ICD-10-CM

## 2016-05-27 DIAGNOSIS — I059 Rheumatic mitral valve disease, unspecified: Secondary | ICD-10-CM

## 2016-05-31 ENCOUNTER — Telehealth: Payer: Self-pay

## 2016-05-31 NOTE — Telephone Encounter (Signed)
I called pt to discuss sleep study results. No answer, left a message asking her to call me back. 

## 2016-05-31 NOTE — Telephone Encounter (Signed)
I spoke to pt and advised her that her sleep study did not reveal significant sleep apnea or significant periodic limb movements of sleep resulting in significant sleep disruption. A cause for the non-restorative nature of the patient's sleep could not be identified. I advised pt to avoid sedative-hypnotics which may worsen sleep apnea, alcohol and tobacco. I advised pt to avoid caffeine containing beverages and chocolate. I advised pt to optimize pain control if the pt awakes from sleep with pain. I advised pt to consider a dedicated sleep psychology referral if insomnia is of clinical concern. I offered pt a follow up appt with Dr. Brett Fairy but she declined at this time. I advised her that I would send a copy of her sleep study to Dr. Noah Delaine. Pt verbalized understanding of results. Pt had no questions at this time but was encouraged to call back if questions arise.

## 2016-06-11 NOTE — Procedures (Signed)
[facility]  EEG (ELECTROENCEPHALOGRAM) REPORT   STUDY DATE:  05-27-2016   PATIENT NAME: Katie [name] CLINICIAN: Larey Seat, MD   TECHNOLOGIST: Ronnald Ramp TECHNIQUE: Electroencephalogram was recorded utilizing standard 10-20 system of lead placement and reformatted into average and bipolar montages.      RECORDING TIME:  30 minutes  ACTIVATION:  strobe lights and hyperventilation    CLINICAL INFORMATION: Patient with extensive past medical history including mitral valve replacement, cardiac tamponade, congestive heart failure, pulmonary hypertension, Raynaud's syndrome, lupus and peripheral artery disease. Presented in May 2017 with sudden spells of severe tiredness, loss of muscle tone episodes lasting for less than 30 seconds sometimes multiple times a day. Patient is currently worked up for [employer].     FINDINGS: EEG  the posterior dominant rhythm of the EEG showed 10 Hz, with good symmetry and normal reaction to external stimuli. No epileptiform discharges, phase reversal activity or periodicity was noted. The patient's heart rate stayed in sinus rhythm at 68 bpm.  Photic stimulation caused no epileptiform discharges,no periodic changes. Patient recorded in the awake/ drowsy but not asleep.     IMPRESSION:  This EEG is a normal study for the patient's age and conscious state. Please note that the patient's further workup will be directed towards TIAs or strokes.     Larey Seat , MD

## 2016-07-23 ENCOUNTER — Encounter (HOSPITAL_COMMUNITY): Payer: Self-pay

## 2016-07-23 ENCOUNTER — Ambulatory Visit (HOSPITAL_COMMUNITY)
Admission: RE | Admit: 2016-07-23 | Discharge: 2016-07-23 | Disposition: A | Discharge status: Home / Self Care | Payer: BC Managed Care – PPO | Source: Ambulatory Visit | Attending: Cardiology | Admitting: Cardiology

## 2016-07-23 VITALS — BP 98/66 | Wt 180.8 lb

## 2016-07-23 DIAGNOSIS — I272 Pulmonary hypertension, unspecified: Secondary | ICD-10-CM | POA: Diagnosis not present

## 2016-07-23 DIAGNOSIS — I509 Heart failure, unspecified: Secondary | ICD-10-CM | POA: Diagnosis not present

## 2016-07-23 DIAGNOSIS — I059 Rheumatic mitral valve disease, unspecified: Secondary | ICD-10-CM

## 2016-07-23 DIAGNOSIS — Z952 Presence of prosthetic heart valve: Secondary | ICD-10-CM | POA: Insufficient documentation

## 2016-07-23 DIAGNOSIS — Z825 Family history of asthma and other chronic lower respiratory diseases: Secondary | ICD-10-CM | POA: Diagnosis not present

## 2016-07-23 DIAGNOSIS — Z79899 Other long term (current) drug therapy: Secondary | ICD-10-CM | POA: Diagnosis not present

## 2016-07-23 DIAGNOSIS — Z7982 Long term (current) use of aspirin: Secondary | ICD-10-CM | POA: Diagnosis not present

## 2016-07-23 DIAGNOSIS — J8410 Pulmonary fibrosis, unspecified: Secondary | ICD-10-CM | POA: Insufficient documentation

## 2016-07-23 DIAGNOSIS — M3214 Glomerular disease in systemic lupus erythematosus: Secondary | ICD-10-CM | POA: Insufficient documentation

## 2016-07-23 DIAGNOSIS — I5022 Chronic systolic (congestive) heart failure: Secondary | ICD-10-CM | POA: Diagnosis not present

## 2016-07-23 DIAGNOSIS — K219 Gastro-esophageal reflux disease without esophagitis: Secondary | ICD-10-CM | POA: Diagnosis not present

## 2016-07-23 DIAGNOSIS — Z7901 Long term (current) use of anticoagulants: Secondary | ICD-10-CM | POA: Diagnosis not present

## 2016-07-23 DIAGNOSIS — I2722 Pulmonary hypertension due to left heart disease: Secondary | ICD-10-CM

## 2016-07-23 DIAGNOSIS — Z48812 Encounter for surgical aftercare following surgery on the circulatory system: Secondary | ICD-10-CM | POA: Diagnosis present

## 2016-07-23 DIAGNOSIS — I050 Rheumatic mitral stenosis: Secondary | ICD-10-CM

## 2016-07-23 LAB — BASIC METABOLIC PANEL
Anion gap: 6 (ref 5–15)
BUN: 10 mg/dL (ref 6–20)
CHLORIDE: 106 mmol/L (ref 101–111)
CO2: 28 mmol/L (ref 22–32)
CREATININE: 0.81 mg/dL (ref 0.44–1.00)
Calcium: 8.6 mg/dL — ABNORMAL LOW (ref 8.9–10.3)
GFR calc non Af Amer: >60 mL/min (ref >60)
Glucose, Bld: 89 mg/dL (ref 65–99)
POTASSIUM: 3.6 mmol/L (ref 3.5–5.1)
Sodium: 140 mmol/L (ref 135–145)

## 2016-07-23 LAB — CBC
HEMATOCRIT: 36.9 % (ref 36.0–46.0)
Hemoglobin: 12.1 g/dL (ref 12.0–15.0)
MCH: 29.7 pg (ref 26.0–34.0)
MCHC: 32.8 g/dL (ref 30.0–36.0)
MCV: 90.4 fL (ref 78.0–100.0)
PLATELETS: 204 10*3/uL (ref 150–400)
RBC: 4.08 MIL/uL (ref 3.87–5.11)
RDW: 15.1 % (ref 11.5–15.5)
WBC: 4.4 10*3/uL (ref 4.0–10.5)

## 2016-07-23 NOTE — Patient Instructions (Signed)
Labs today  We will contact you in 6 months to schedule your next appointment.  

## 2016-07-23 NOTE — Progress Notes (Signed)
6 min walk test complete, pt ambulated 1430 ft (436 m)

## 2016-07-25 NOTE — Progress Notes (Signed)
Patient ID: Katie [name], female   DOB: 08/21/1966, 50 y.o.   MRN: [id number] PCP: Dr. 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    Echo in 5/17 showed EF 60-65%, normal mechanical mitral valve, and PA systolic pressure 33 mmHg with normal RV.   No dyspnea walking fast or up steps. No difficulty walking on flat ground. No chest pain, no lightheadedness.  No bleeding problems on warfarin. Weight stable. She drives a school bus.  She has been doing some walking for exercise.  Good 6 minute walk today.   Labs (8/11): HCT 27 => 30, creatinine 0.98, INR 4 Labs (9/11): K 4.6, creatinine 0.9, HCT 32.7, BNP 214=>146, LFTs normal, TSH normal Labs (1/12): HCT 37.9, K 3.5, creatinine 0.9 Labs (4/12): LDL 108, HDL 43, TSH normal, K 4.1, creatinine 0.8, HCT 37.9 Labs (1/13): K 3.8, creatinine 0.8 Labs (11/14): K 3.6, creatinine 0.71, LDL 94, HCT 38 Labs (12/15): Hgb 12.5 Labs (2/16): K 3.9, creatinine 0.84 Labs (8/16): K 3.9, creatinine 0.87, HCT 38.6 Labs (4/17): K 3.6, creatinine 0.88, LDL 92, HDL 38, hgb 11.7  Allergies (verified):  1)  ! Methotrexate  Past Medical History: 1. Mixed connective tissue disease: diagnosis 1990. 8/10 ANA positive, anti-dsDNA positive, anti-SCL70 negative 2. 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     - PFT's 03/06/99  VC 47%  DLC0 26%    - PFT's 09/12/08  VC 43%  DLC0 45%    - PFT's 6/10 FVC 37%, FEV1 34%, ratio 69%, TLC 50%    - PFTs 11/15 FVC 39%, FEV1 35%, ratio 89%, TLC 63%, DLCO 48% 4. 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 CHF: Secondary to moderate mitral stenosis and moderate mitral regurgitation in the setting of rheumatic mitral valve disease as well as severe pulmonary  HTN likely due to a combination of MV disease and pulmonary arterial HTN in the setting of collagen vascular disease.   Not valvuloplasty candidate due to MR.  Patient had MV replacement with mechanical MV in 8/11.  Echo (8/11) post-op showed normally functioning mechanical MV, EF 60-65% with septal bounce, small mobile structure in the mid ventricle (? loose papillary muscle), RV-RA gradient 22 mmHg.  Echo (10/11): EF 60-65%, normally functioning mechanical mitral valve.  Echo (7/13): EF 55-60%, mechanical mitral valve with mean gradient 4 mmHg, PA systolic pressure 29 mmHg. Echo (5/15) with EF 55-60%, mechanical mitral valve functioning normally, normal RV size and systolic function, PA systolic pressure 32 mmHg.  - Echo in 5/17 showed EF 60-65%, normal mechanical mitral valve, and PA systolic pressure 33 mmHg with normal RV.  6.  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  CTA chest (7/10) with no evidence for pulmonary emboli or chronic pulmonary emboli.  PA pressure 72/28 mean 50 with PVR 5.7 WU on initial RHC, PA pressure 45/21 mean 30 PVR 2.3 WU on f/u RHC on Revatio 80 mg three times a day (1/11).  After MV surgery, Revatio stopped.  Increased dyspnea.  Repeat RHC with mean RA 6, PA 42/20 (mean 29), PVR 4.8 WU, CI 2.3.  Revatio 20 mg three times a day restarted.  Echo (0000000) with PA systolic pressure 29 mmHg. Echo (123456) with PA systolic pressure 32  mmHg.  - 3/11 6 min walk 427 m - 2/12 6 min walk 427 m - 1/13 6 min walk 411 m - 4/15 6 min walk 448 m - 2/16 6 min walk 421 m - 8/16 6 min walk 354 m - 9/17 6 min walk 436 m 7.  Rheumatic fever 8.  LHC (9/10): no angiographic CAD.  9.  Warfarin anticoagulation for mechanical MV, goal INR 2.5-3.5 10.  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  11. 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 C  difficile diarrhea 1/12 13. GERD  Family History: Emphysema mother, smoker.  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   ROS: All systems reviewed and negative except as noted in HPI.   Current Outpatient Prescriptions  Medication Sig Dispense Refill  . acetaminophen (TYLENOL) 325 MG tablet Take 650 mg by mouth. As directed as needed     . aspirin 81 MG EC tablet Take 81 mg by mouth daily.      . Calcium Citrate-Vitamin D (CALCIUM + D PO) Take by mouth.    . furosemide (LASIX) 20 MG tablet TAKE 1 TABLET (20 MG TOTAL) BY MOUTH DAILY. 30 tablet 6  . hydroxychloroquine (PLAQUENIL) 200 MG tablet Take 2 tablets daily    . metoprolol succinate (TOPROL-XL) 25 MG 24 hr tablet Take 0.5 tablets (12.5 mg total) by mouth daily. 15 tablet 3  . Multiple Vitamin (MULTIVITAMIN) tablet Take 1 tablet by mouth daily.      . potassium chloride SA (KLOR-CON M20) 20 MEQ tablet Take 1 tablet (20 mEq total) by mouth 2 (two) times daily. 60 tablet 2  . predniSONE (DELTASONE) 5 MG tablet Take 5 mg by mouth daily.      . sildenafil (REVATIO) 20 MG tablet Take 1 tablet (20 mg total) by mouth 3 (three) times daily. 270 tablet 3  . Vitamin D, Cholecalciferol, 400 units CAPS Take by mouth.    . warfarin (COUMADIN) 5 MG tablet Take as directed by the anticoagulation clinic. 135 tablet 3   No current facility-administered medications for this encounter.     BP 98/66 (BP Location: Left Arm, Patient Position: Sitting, Cuff Size: Normal)   Wt 180 lb 12.8 oz (82 kg)   BMI 31.03 kg/m  General:  Well developed, well nourished, in no acute distress. Neck:  Neck supple, no JVD. No masses, thyromegaly or abnormal cervical nodes. Lungs:  Slight crackles at bases.  Heart:  Non-displaced PMI,  regular rate and rhythm, S1, S2. mechanical S1.  Carotid upstroke normal, no bruit. Pedals normal pulses. Trace ankle edema.  Abdomen:  Bowel sounds positive; abdomen  soft and non-tender without masses, organomegaly, or hernias noted. No hepatosplenomegaly. Extremities:  No clubbing or cyanosis. Neurologic:  Alert and oriented x 3. Psych:  Normal affect.  Assessment/Plan:  1. Status post mechanical MVR:  Mitral valve replacement, stable from valve standpoint.  She is on coumadin goal INR 2.5 - 3.5 and ASA 81 mg daily. Check CBC today. Echo in 5/17 showed stable mechanical valve.   2.  Pulmonary HTN:  Pulmonary HTN is likely a mixed picture, from mitral valve disease as well as mixed connective tissue disease.  She had mild residual pulmonary HTN with moderately increased PVR even after mitral valve surgery on last RHC though this was improved compared to initial RHC.  The residual pulmonary HTN may be due to pulmonary vascular remodeling with chronic  left atrial pressure elevation in the setting of mitral valve disease.  Revatio was restarted at 20 mg tid.  Most recent echo in [id number] showed PA systolic pressure 33 mmHg.  RV looked normal.  Good 6 minute walk today.  - Continue current Lasix 20 mg daily and check BMET.  - Continue current Revatio. 3. Mixed connective tissue disorder: Patient has history of pulmonary fibrosis. Followed by pulmonary and rheumatology at [facility], PFTs in 11/15 were stable.   Followup in 6 months.   Loralie Champagne 07/25/2016

## 2016-09-13 ENCOUNTER — Encounter (HOSPITAL_COMMUNITY): Payer: Self-pay | Admitting: Obstetrics and Gynecology

## 2016-09-13 ENCOUNTER — Other Ambulatory Visit: Payer: Self-pay | Admitting: Obstetrics and Gynecology

## 2016-09-13 DIAGNOSIS — N6320 Unspecified lump in the left breast, unspecified quadrant: Secondary | ICD-10-CM

## 2016-09-17 ENCOUNTER — Telehealth (HOSPITAL_COMMUNITY): Payer: Self-pay

## 2016-09-17 NOTE — Telephone Encounter (Signed)
Patient left VM on CHF clinic triage line, did not specify needs, did not sound in distress. Attempted to return call, no answer, left VM to call back.  Renee Pain, RN

## 2016-09-20 ENCOUNTER — Ambulatory Visit
Admission: RE | Admit: 2016-09-20 | Discharge: 2016-09-20 | Disposition: A | Discharge status: Home / Self Care | Payer: BC Managed Care – PPO | Source: Ambulatory Visit | Attending: Obstetrics and Gynecology | Admitting: Obstetrics and Gynecology

## 2016-09-20 DIAGNOSIS — N6320 Unspecified lump in the left breast, unspecified quadrant: Secondary | ICD-10-CM

## 2016-09-21 ENCOUNTER — Telehealth (HOSPITAL_COMMUNITY): Payer: Self-pay

## 2016-09-21 MED ORDER — SILDENAFIL CITRATE 20 MG PO TABS: 20.0000 mg | ORAL_TABLET | Freq: Three times a day (TID) | ORAL | 3 refills | Status: DC

## 2016-09-21 NOTE — Telephone Encounter (Signed)
Patient requesting 30 day refill to be faxed to # [phone].  Also note in chart to fax this Rx to [phone]. Faxed to both numbers.  Renee Pain, RN

## 2016-09-23 ENCOUNTER — Telehealth (HOSPITAL_COMMUNITY): Payer: Self-pay | Admitting: Pharmacist

## 2016-09-23 NOTE — Telephone Encounter (Addendum)
Called and left VM for patient. She has been getting Revatio (brand) through [employer] patient assistance program but this will run out on 10/10/16 and she will need to renew her application.   ADDENDUM: Ms. Beilstein called back stating that she has already sent in the renewal paperwork.   Ruta Hinds. Velva Harman, PharmD, BCPS, CPP Clinical Pharmacist Pager: [phone] Phone: [phone] 09/23/2016 4:33 PM

## 2016-11-01 ENCOUNTER — Telehealth (HOSPITAL_COMMUNITY): Payer: Self-pay | Admitting: Pharmacist

## 2016-11-01 NOTE — Telephone Encounter (Signed)
Revatio 20 mg TID approved by [employer] patient assistance so that she will receive 3 month supplies to her home at no cost to her through 10/10/17.   Ruta Hinds. Velva Harman, PharmD, BCPS, CPP Clinical Pharmacist Pager: [phone] Phone: [phone] 11/01/2016 2:32 PM

## 2016-11-08 ENCOUNTER — Other Ambulatory Visit (HOSPITAL_COMMUNITY): Payer: Self-pay | Admitting: Cardiology

## 2016-12-31 ENCOUNTER — Other Ambulatory Visit: Payer: Self-pay | Admitting: Obstetrics and Gynecology

## 2016-12-31 DIAGNOSIS — Z1231 Encounter for screening mammogram for malignant neoplasm of breast: Secondary | ICD-10-CM

## 2017-01-04 ENCOUNTER — Other Ambulatory Visit (HOSPITAL_COMMUNITY): Payer: Self-pay | Admitting: Cardiology

## 2017-01-04 MED ORDER — POTASSIUM CHLORIDE CRYS ER 20 MEQ PO TBCR: 20.0000 meq | EXTENDED_RELEASE_TABLET | Freq: Two times a day (BID) | ORAL | 2 refills | Status: DC

## 2017-01-04 MED ORDER — METOPROLOL SUCCINATE ER 25 MG PO TB24: 12.5000 mg | ORAL_TABLET | Freq: Every day | ORAL | 3 refills | Status: DC

## 2017-01-14 ENCOUNTER — Other Ambulatory Visit (HOSPITAL_COMMUNITY): Payer: Self-pay | Admitting: Cardiology

## 2017-01-14 MED ORDER — METOPROLOL SUCCINATE ER 25 MG PO TB24: 12.5000 mg | ORAL_TABLET | Freq: Every day | ORAL | 3 refills | Status: DC

## 2017-01-28 ENCOUNTER — Ambulatory Visit: Payer: BC Managed Care – PPO

## 2017-02-09 ENCOUNTER — Ambulatory Visit
Admission: RE | Admit: 2017-02-09 | Discharge: 2017-02-09 | Disposition: A | Discharge status: Home / Self Care | Payer: BC Managed Care – PPO | Source: Ambulatory Visit | Attending: Obstetrics and Gynecology | Admitting: Obstetrics and Gynecology

## 2017-02-09 DIAGNOSIS — Z1231 Encounter for screening mammogram for malignant neoplasm of breast: Secondary | ICD-10-CM

## 2017-02-21 ENCOUNTER — Other Ambulatory Visit (HOSPITAL_COMMUNITY): Payer: Self-pay | Admitting: *Deleted

## 2017-02-21 MED ORDER — POTASSIUM CHLORIDE CRYS ER 20 MEQ PO TBCR: 20.0000 meq | EXTENDED_RELEASE_TABLET | Freq: Two times a day (BID) | ORAL | 2 refills | Status: DC

## 2017-02-21 MED ORDER — METOPROLOL SUCCINATE ER 25 MG PO TB24: 12.5000 mg | ORAL_TABLET | Freq: Every day | ORAL | 3 refills | Status: DC

## 2017-02-21 MED ORDER — FUROSEMIDE 20 MG PO TABS: ORAL_TABLET | ORAL | 6 refills | Status: DC

## 2017-03-04 ENCOUNTER — Other Ambulatory Visit (HOSPITAL_COMMUNITY): Payer: Self-pay | Admitting: Cardiology

## 2017-03-04 MED ORDER — POTASSIUM CHLORIDE CRYS ER 20 MEQ PO TBCR: 20.0000 meq | EXTENDED_RELEASE_TABLET | Freq: Two times a day (BID) | ORAL | 3 refills | Status: DC

## 2017-03-04 MED ORDER — FUROSEMIDE 20 MG PO TABS: ORAL_TABLET | ORAL | 3 refills | Status: DC

## 2017-03-04 NOTE — Addendum Note (Signed)
Addended by: Kerry Dory on: 03/04/2017 04:40 PM   Modules accepted: Orders

## 2017-03-15 ENCOUNTER — Telehealth (HOSPITAL_COMMUNITY): Payer: Self-pay | Admitting: Pharmacist

## 2017-03-15 NOTE — Telephone Encounter (Signed)
Katie [name] Revatio was shipped to her home on 03/14/17 (#270).   Ruta Hinds. Velva Harman, PharmD, BCPS, CPP Clinical Pharmacist Pager: [phone] Phone: [phone] 03/15/2017 11:31 AM

## 2017-09-22 ENCOUNTER — Other Ambulatory Visit (HOSPITAL_COMMUNITY): Payer: Self-pay | Admitting: *Deleted

## 2017-09-22 MED ORDER — SILDENAFIL CITRATE 20 MG PO TABS: 20.0000 mg | ORAL_TABLET | Freq: Three times a day (TID) | ORAL | 3 refills | Status: DC

## 2017-10-24 ENCOUNTER — Encounter (HOSPITAL_COMMUNITY): Payer: Self-pay | Admitting: Cardiology

## 2017-10-24 ENCOUNTER — Ambulatory Visit (HOSPITAL_COMMUNITY)
Admission: RE | Admit: 2017-10-24 | Discharge: 2017-10-24 | Disposition: A | Discharge status: Home / Self Care | Payer: BC Managed Care – PPO | Source: Ambulatory Visit | Attending: Cardiology | Admitting: Cardiology

## 2017-10-24 VITALS — BP 138/82 | HR 73 | Wt 179.2 lb

## 2017-10-24 DIAGNOSIS — I272 Pulmonary hypertension, unspecified: Secondary | ICD-10-CM | POA: Diagnosis not present

## 2017-10-24 DIAGNOSIS — Z7982 Long term (current) use of aspirin: Secondary | ICD-10-CM | POA: Diagnosis not present

## 2017-10-24 DIAGNOSIS — Z952 Presence of prosthetic heart valve: Secondary | ICD-10-CM | POA: Diagnosis not present

## 2017-10-24 DIAGNOSIS — I059 Rheumatic mitral valve disease, unspecified: Secondary | ICD-10-CM | POA: Insufficient documentation

## 2017-10-24 DIAGNOSIS — Z79899 Other long term (current) drug therapy: Secondary | ICD-10-CM | POA: Insufficient documentation

## 2017-10-24 DIAGNOSIS — R42 Dizziness and giddiness: Secondary | ICD-10-CM | POA: Diagnosis not present

## 2017-10-24 DIAGNOSIS — Z7901 Long term (current) use of anticoagulants: Secondary | ICD-10-CM | POA: Diagnosis not present

## 2017-10-24 DIAGNOSIS — I5032 Chronic diastolic (congestive) heart failure: Secondary | ICD-10-CM

## 2017-10-24 DIAGNOSIS — M351 Other overlap syndromes: Secondary | ICD-10-CM | POA: Insufficient documentation

## 2017-10-24 DIAGNOSIS — I00 Rheumatic fever without heart involvement: Secondary | ICD-10-CM | POA: Diagnosis not present

## 2017-10-24 DIAGNOSIS — I5022 Chronic systolic (congestive) heart failure: Secondary | ICD-10-CM

## 2017-10-24 DIAGNOSIS — J8410 Pulmonary fibrosis, unspecified: Secondary | ICD-10-CM | POA: Insufficient documentation

## 2017-10-24 LAB — BASIC METABOLIC PANEL
Anion gap: 5 (ref 5–15)
BUN: 8 mg/dL (ref 6–20)
CALCIUM: 8.4 mg/dL — AB (ref 8.9–10.3)
CO2: 26 mmol/L (ref 22–32)
Chloride: 108 mmol/L (ref 101–111)
Creatinine, Ser: 0.75 mg/dL (ref 0.44–1.00)
Glucose, Bld: 86 mg/dL (ref 65–99)
Potassium: 4 mmol/L (ref 3.5–5.1)
SODIUM: 139 mmol/L (ref 135–145)

## 2017-10-24 LAB — CBC
HCT: 38.2 % (ref 36.0–46.0)
Hemoglobin: 12.4 g/dL (ref 12.0–15.0)
MCH: 30.3 pg (ref 26.0–34.0)
MCHC: 32.5 g/dL (ref 30.0–36.0)
MCV: 93.4 fL (ref 78.0–100.0)
PLATELETS: 189 10*3/uL (ref 150–400)
RBC: 4.09 MIL/uL (ref 3.87–5.11)
RDW: 13.8 % (ref 11.5–15.5)
WBC: 3.5 10*3/uL — AB (ref 4.0–10.5)

## 2017-10-24 NOTE — Progress Notes (Signed)
Pt completed 6 minute walk test. Pt walked 1200 ft (366 meters). O2 sats ranged from 88%-100%, and HR ranged from 73-101. Information given to Dr. Aundra Dubin and no further orders at this time.

## 2017-10-24 NOTE — Progress Notes (Signed)
Patient ID: Katie [name], female   DOB: [birth year], 52 y.o.   MRN: [id number] PCP: Dr. Shelia Media Cardiology: Dr . Aundra Dubin  52 y.o. 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    Echo in 5/17 showed EF 60-65%, normal mechanical mitral valve, and PA systolic pressure 33 mmHg with normal RV.   She has been stable symptomatically.  She continues to drive a school bus.  No dyspnea walking on flat ground, short of breath walking up stairs.  This has been stable. Rare sharp, momentary left-sided chest pain.  No orthopnea/PND.  Weight stable. She reports occasional "dizzy spells," no particular trigger.  When she has these spells she will also feel "sleepy."  She had a negative sleep study in 2017 per her report.   ECG (personally reviewed): NSR, low voltage.   Labs (8/11): HCT 27 => 30, creatinine 0.98, INR 4 Labs (9/11): K 4.6, creatinine 0.9, HCT 32.7, BNP 214=>146, LFTs normal, TSH normal Labs (1/12): HCT 37.9, K 3.5, creatinine 0.9 Labs (4/12): LDL 108, HDL 43, TSH normal, K 4.1, creatinine 0.8, HCT 37.9 Labs (1/13): K 3.8, creatinine 0.8 Labs (11/14): K 3.6, creatinine 0.71, LDL 94, HCT 38 Labs (12/15): Hgb 12.5 Labs (2/16): K 3.9, creatinine 0.84 Labs (8/16): K 3.9, creatinine 0.87, HCT 38.6 Labs (4/17): K 3.6, creatinine 0.88, LDL 92, HDL 38, hgb 11.7 Labs (10/17): K 3.6, creatinine 0.81, hgb 12.1  Allergies (verified):  1)  ! Methotrexate  Past Medical History: 1. Mixed connective tissue disease: diagnosis 1990. 8/10 ANA positive, anti-dsDNA positive, anti-SCL70 negative 2. 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     - PFT's 03/06/99  VC 47%  DLC0 26%    - PFT's 09/12/08   VC 43%  DLC0 45%    - PFT's 6/10 FVC 37%, FEV1 34%, ratio 69%, TLC 50%    - PFTs 11/15 FVC 39%, FEV1 35%, ratio 89%, TLC 63%, DLCO 48% 4. 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 CHF: Secondary to moderate mitral stenosis and moderate mitral regurgitation in the setting of rheumatic mitral valve disease as well as severe pulmonary HTN likely due to a combination of MV disease and pulmonary arterial HTN in the setting of collagen vascular disease.   Not valvuloplasty candidate due to MR.  Patient had MV replacement with mechanical MV in 8/11.  Echo (8/11) post-op showed normally functioning mechanical MV, EF 60-65% with septal bounce, small mobile structure in the mid ventricle (? loose papillary muscle), RV-RA gradient 22 mmHg.  Echo (10/11): EF 60-65%, normally functioning mechanical mitral valve.  Echo (7/13): EF 55-60%, mechanical mitral valve with mean gradient 4 mmHg, PA systolic pressure 29 mmHg. Echo (5/15) with EF 55-60%, mechanical mitral valve functioning normally, normal RV size and systolic function, PA systolic pressure 32 mmHg.  - Echo in 5/17 showed EF 60-65%, normal mechanical mitral valve, and PA systolic pressure 33 mmHg with normal RV.  6.  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  CTA chest (7/10) with no evidence for pulmonary emboli or chronic pulmonary emboli.  PA pressure 72/28 mean 50 with PVR 5.7 WU on initial RHC, PA pressure 45/21 mean 30 PVR 2.3 WU on f/u RHC on Revatio 80 mg  three times a day (1/11).  After MV surgery, Revatio stopped.  Increased dyspnea.  Repeat RHC with mean RA 6, PA 42/20 (mean 29), PVR 4.8 WU, CI 2.3.  Revatio 20 mg three times a day restarted.  Echo (5/63) with PA systolic pressure 29 mmHg. Echo (1/49) with PA systolic pressure 32 mmHg.  - 3/11 6 min walk 427 m - 2/12 6 min walk 427 m - 1/13 6 min walk 411 m - 4/15 6 min walk 448 m - 2/16 6 min walk 421 m - 8/16 6  min walk 354 m - 9/17 6 min walk 436 m - 1/19 6 min walk 366 m 7.  Rheumatic fever 8.  LHC (9/10): no angiographic CAD.  9.  Warfarin anticoagulation for mechanical MV, goal INR 2.5-3.5 10.  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  11. 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 C difficile diarrhea 1/12 13. GERD  Family History: Emphysema mother, smoker.  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   ROS: All systems reviewed and negative except as noted in HPI.   Current Outpatient Medications  Medication Sig Dispense Refill  . acetaminophen (TYLENOL) 325 MG tablet Take 650 mg by mouth. As directed as needed     . aspirin 81 MG EC tablet Take 81 mg by mouth daily.      . Calcium Citrate-Vitamin D (CALCIUM + D PO) Take by mouth.    . furosemide (LASIX) 20 MG tablet TAKE 1 TABLET (20 MG TOTAL) BY MOUTH DAILY. 90 tablet 3  . hydroxychloroquine (PLAQUENIL) 200 MG tablet Take 2 tablets daily    . magnesium oxide (MAG-OX) 400 MG tablet Take 400 mg by mouth daily.    . metoprolol succinate (TOPROL-XL) 25 MG 24 hr tablet Take 0.5 tablets (12.5 mg total) by mouth daily. 45 tablet 3  . Multiple Vitamin (MULTIVITAMIN) tablet Take 1 tablet by mouth daily.      . potassium chloride SA (KLOR-CON M20) 20 MEQ tablet Take 1 tablet (20 mEq total) by mouth 2 (two) times daily. 180 tablet 3  . predniSONE (DELTASONE) 5 MG tablet Take 5 mg by mouth daily.      . sildenafil (REVATIO) 20 MG tablet Take 1 tablet (20 mg total) by mouth 3 (three) times daily. 270 tablet 3  . Vitamin D, Cholecalciferol, 400 units CAPS Take by mouth.    . warfarin (COUMADIN) 5 MG tablet Take as directed by the anticoagulation clinic. 135 tablet 3   No current facility-administered medications for this encounter.     BP 138/82   Pulse 73   Wt 179 lb 3.2 oz (81.3 kg)   SpO2 100%   BMI  30.76 kg/m  General: NAD Neck: No JVD, no thyromegaly or thyroid nodule.  Lungs: Clear to auscultation bilaterally with normal respiratory effort. CV: Nondisplaced PMI.  Heart regular S1/S2 with mechanical S1, no S3/S4, no murmur.  No peripheral edema.  No carotid bruit.  Normal pedal pulses.  Abdomen: Soft, nontender, no hepatosplenomegaly, no distention.  Skin: Intact without lesions or rashes.  Neurologic: Alert and oriented x 3.  Psych: Normal affect. Extremities: No clubbing or cyanosis.  HEENT: Normal.    Assessment/Plan:  1. Status post mechanical MVR:  Mitral valve replacement, stable from valve standpoint.  She is on coumadin goal INR 2.5 - 3.5 and ASA 81 mg daily.  - Check CBC today.  - She  is due for repeat echo, will order.  2.  Pulmonary HTN:  Pulmonary HTN is likely a mixed picture, from mitral valve disease as well as mixed connective tissue disease.  She had mild residual pulmonary HTN with moderately increased PVR even after mitral valve surgery on last RHC though this was improved compared to initial RHC.  The residual pulmonary HTN may be due to pulmonary vascular remodeling with chronic left atrial pressure elevation in the setting of mitral valve disease.  Revatio was restarted at 20 mg tid.  Most recent echo in 2/83 showed PA systolic pressure 33 mmHg.  RV looked normal.   - 6 minute walk today.  - Will repeat echo to estimate PA pressure and assess RV.  - Continue current Lasix 20 mg daily and check BMET.  - Continue current Revatio. 3. Mixed connective tissue disorder: Patient has history of pulmonary fibrosis. Followed by pulmonary and rheumatology at [facility].  4. "Dizzy spells:" occur most days, short-lived. I will have her wear a 24 hr holter to rule out arrhythmias.   Followup in 6 months.   Loralie Champagne 10/24/2017

## 2017-10-24 NOTE — Patient Instructions (Signed)
Labs drawn today (if we do not call you, then your lab work was stable)   Your physician has requested that you have an echocardiogram. Echocardiography is a painless test that uses sound waves to create images of your heart. It provides your doctor with information about the size and shape of your heart and how well your heart's chambers and valves are working. This procedure takes approximately one hour. There are no restrictions for this procedure.  Your physician has recommended that you wear a holter monitor. Holter monitors are medical devices that record the heart's electrical activity. Doctors most often use these monitors to diagnose arrhythmias. Arrhythmias are problems with the speed or rhythm of the heartbeat. The monitor is a small, portable device. You can wear one while you do your normal daily activities. This is usually used to diagnose what is causing palpitations/syncope (passing out).   We did 6 minute walk test  Your physician recommends that you schedule a follow-up appointment in: 6 months with Dr. Aundra Dubin  (we will call you)

## 2017-10-26 ENCOUNTER — Ambulatory Visit (INDEPENDENT_AMBULATORY_CARE_PROVIDER_SITE_OTHER): Payer: BC Managed Care – PPO

## 2017-10-26 DIAGNOSIS — R42 Dizziness and giddiness: Secondary | ICD-10-CM

## 2017-11-07 ENCOUNTER — Other Ambulatory Visit: Payer: Self-pay

## 2017-11-07 ENCOUNTER — Ambulatory Visit (HOSPITAL_COMMUNITY): Payer: BC Managed Care – PPO | Attending: Cardiology

## 2017-11-07 DIAGNOSIS — I071 Rheumatic tricuspid insufficiency: Secondary | ICD-10-CM | POA: Diagnosis not present

## 2017-11-07 DIAGNOSIS — Z952 Presence of prosthetic heart valve: Secondary | ICD-10-CM | POA: Diagnosis not present

## 2017-11-07 DIAGNOSIS — I272 Pulmonary hypertension, unspecified: Secondary | ICD-10-CM | POA: Insufficient documentation

## 2017-11-07 DIAGNOSIS — I5032 Chronic diastolic (congestive) heart failure: Secondary | ICD-10-CM

## 2017-12-05 ENCOUNTER — Encounter: Payer: Self-pay | Admitting: Neurology

## 2017-12-06 ENCOUNTER — Encounter: Payer: Self-pay | Admitting: Neurology

## 2017-12-06 ENCOUNTER — Ambulatory Visit: Payer: BC Managed Care – PPO | Admitting: Neurology

## 2017-12-06 VITALS — BP 110/75 | HR 90 | Ht 64.0 in | Wt 178.0 lb

## 2017-12-06 DIAGNOSIS — F5103 Paradoxical insomnia: Secondary | ICD-10-CM | POA: Diagnosis not present

## 2017-12-06 DIAGNOSIS — R42 Dizziness and giddiness: Secondary | ICD-10-CM | POA: Diagnosis not present

## 2017-12-06 NOTE — Progress Notes (Signed)
SLEEP MEDICINE CLINIC   Provider:  Larey Seat, M D  Referring Provider: Thressa Sheller, MD Primary Care Physician:  Deland Pretty, MD  Chief Complaint  Patient presents with  . New Patient (Initial Visit)    pt alone, rm 11, pt states that she has been having light head, dizziness and she notices it when she is driving. pt states that she is not sleeping well also. when she wakes up, she wakes up with headache and that is when she knows she is going to have some dizziness    HPI:  Katie [name] is a 52 y.o. female , now re- referred by Dr Shelia Media.  I had the pleasure of meeting Katie [name] about 18 months ago when she was referred by her ENT specialist Dr. Erik Obey and her primary care physician Dr. Trilby Drummer.  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  She had been diagnosed with pulmonary hypertension and coronary artery disease as well as with peri-cardial effusion.  She was not excessively sleepy in daytime but felt that her extremities were rather to fall asleep and stay asleep. The patient is also mother of an 62-year-old son, and his main caretaker.  At the time of her first sleep study I recommended for PCP a referral to a behavior or cognitive psychologist to help the patient learn and implement better sleep habits. She was never referred, she stated.  She reports the same problems with insomnia, she did not fill out Epworth score ,nor FSS.  She believes not to snore. 2-3 nocturias each night. Bed time is variable.  She tries for 9 PM to start getting ready for bed.  The earlier she gets to bed, the earlier she wakes up.  All started with prednisone therapy .Marland Kitchen...     05-2016 , CD consult  Previously seen here as a referral from Jodi Marble, MD, who referred this patient because of excessive daytime fatigue and dizziness feeling tired all the time. The patient has an extensive past medical history she underwent a mitral valve replacement and developed a cardiac tamponade, she carries a diagnosis of congestive heart failure, pulmonary hypertension due to high resistance, Raynaud's syndrome, systemic lupus erythematosus with pulmonary lupus, peripheral artery obstruction disease, vascular necrosis of the bones, she was referred by Dr. Thressa Sheller her primary care physician to Dr. Erik Obey. She begun feeling in May 2017 episodes of sudden onset of dizziness, described as a lightheadedness and followed by a sudden feeling of severe tiredness and fatigue. She could just fall asleep she felt so fatigued and let, heavy without any strength left in her. The episodes lasted less than 30 seconds that could happen several time each day. 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 She has no history of traumatic brain injury in also throat trauma or previous strokes. There is no vestibulitis present or symptom of vestibulitis she never had a spinning sensation or the feeling of being in motion and actually at rest. The spells only happen when she drives. Mornings and afternoons. Unrelated to food intake times or sleep.  Insomnia since 1990 , ever since she started on prednisone for Lupus.  Sleep habits are as follows:She usually goes  to bed between 10:30 and 11 PM she lives with her son. After she goes to bed it takes her about 90 minutes 220 minutes to go to sleep. She keeps her bedroom cool, quiet and dark, she sleeps on 2 pillows she usually prefers to sleep prone. She does not suffer shortness of breath at night, palpitations have never woken her from her sleep is fragmented at least twice at night when she has the urge to urinate since she is on  diuretics. It is very difficult for her to go back to sleep after these interruptions. Her total sleep time may be as little as 4 hours at night. There is no witness to her sleep pattern and her son cannot  state if she snores or if she has apnea. She frequently wakes up with headaches but the headaches do not wake her, they do not have an episodic cluster character. She has to arise at 5 former work as a Teacher, early years/pre but she usually is awake long before that. She does not wake up with a dry mouth but has headaches, she has not woken up with unexplained injuries  Or bruises, incontinence or tongue bite. She recalls dreaming, vividly.   Sleep medical history and family sleep history:  She has no history of sleepwalking, enuresis, night terrors.  Social history: Katie [name] is single, she is a 69-year-old son and works as a Teacher, early years/pre. She was diagnosed at age 37 with lupus. Her lupus did not flareup in her late pregnancy. 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   Review of Systems: Out of a complete 14 system review, the patient complains of only the following symptoms, and all other reviewed systems are negative.   hoarse,  Coughing, cold, Insomnia, sleep deprivation, School bus driver, morning headaches, discoid lupus,  pulmonary hypertension, dizzy spells.   In 05-2016 -Epworth score 7 , Fatigue severity score 63  , depression score 2/15    Social History   Socioeconomic History  . Marital status: Single    Spouse name: Not on file  . Number of children: Not on file  . Years of education: Not on file  . Highest education level: Not on file  Social Needs  . Financial resource strain: Not on file  . Food insecurity - worry: Not on file  . Food insecurity - inability: Not on file  . Transportation needs - medical: Not on file  . Transportation needs - non-medical: Not on file  Occupational History  . Not on file  Tobacco Use  . Smoking status:  Never Smoker  . Smokeless tobacco: Never Used  Substance and Sexual Activity  . Alcohol use: No  . Drug use: No  . Sexual activity: Not Currently    Birth control/protection: Other-see comments    Comment: BTL  Other Topics Concern  . 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     Family History  Problem Relation Age of Onset  . Emphysema Mother        smoker  . COPD Mother   . Heart disease Maternal Grandmother        side of family not given  . Diabetes Maternal Grandmother   . Hypertension Father   . Osteoarthritis Father   . Sleep apnea Father   . Breast cancer Maternal Aunt     Past Medical History:  Diagnosis Date  .  Abscess of right breast    sternal osteomyeliitis, pseudomonas  . C. difficile diarrhea 1/12  . CAD (coronary artery disease)    LHC (9/10): no angiographic CAD  . 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   . CHF (congestive heart failure) (HCC)    secondary to mmoderate mitral stenosis and moderate mitral regurgitation in the setting of rheumatic vlave disease as well as severe pulmonary HTN likely due to a combination of MV disease and pulmoanary arterial HTN in the setting of collagen vascular disease. Not valvuloplasty candidate due to MR. patient had MV replacement with mechanical MV in 8/11.  Marland Kitchen CHF (congestive heart failure) (HCC)    Echo post-op showed normally functioning mechanical MV, EF 60-65% with septal bounce, small mobile structure in the mid ventricle (? loose papillary muscle), RV-RA gradient 22 mmHg. Echo (10/11): EF 60-65%, normally functioning mechanical mitral vlavue.   . Mixed connective tissue disease ([place])    diagnosis 1990. 8/10 ANA positive, anti-dDNA positive, anti-SCL70 negative  . 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 Pulmonary HTN ([place])    porbably secondary to combination of rheumatic MV disease and  pulmonary arterial HYN form vollagen vascalr disease. CTA chest (7/10) with no evidence for pulmonary emboli or chronic pulmonary emboli. PA pressure 72/28 mean 50 with PVR 5.7 WU on initial RHC, PA pressure 45/21 mean 30 PVR 2.3 WU on f/u RHC on Revatio 80 mg three times a day (1/11).  After MV surgery, Revatio stopped.  . Pulmonary HTN ([place])    increased  dyspnea. Repeat RHC with mean RA 6, PA 42/20 mean 29, PVR 4.8 WU,  2.3. Revatio 20 mg  3x a day restarted. 3/11 6 min walk 436m. 2/12 6 min walk 427 m.   . Raynaud's syndrome   . Restrictive lung disease    secondary to MCTD. PFTs 03/06/99 VC 47% DLCO 26%. PFTs 09/12/08 VC 43% DLCO 45%. PFTs 6/10 FVC 37%, FEV1 34%, ratio 69%, TLC 50%. using home O2 periodically  . Rheumatic fever   . Warfarin anticoagulation    for mechanical MV, goal INR 2.5-3.5    Past Surgical History:  Procedure Laterality Date  . BREAST CYST ASPIRATION  03/09/2012  . CESAREAN SECTION    . COLONOSCOPY    . s/p mitral valvue replacement  05/13/2010   by Dr. Roxy Manns by minimally invasive window.   . TUBAL LIGATION      Current Outpatient Medications  Medication Sig Dispense Refill  . acetaminophen (TYLENOL) 325 MG tablet Take 650 mg by mouth. As directed as needed     . aspirin 81 MG EC tablet Take 81 mg by mouth daily.      . Calcium Citrate-Vitamin D (CALCIUM + D PO) Take by mouth.    . furosemide (LASIX) 20 MG tablet TAKE 1 TABLET (20 MG TOTAL) BY MOUTH DAILY. 90 tablet 3  . hydroxychloroquine (PLAQUENIL) 200 MG tablet Take 2 tablets daily    . magnesium oxide (MAG-OX) 400 MG tablet Take 400 mg by mouth daily.    . metoprolol succinate (TOPROL-XL) 25 MG 24 hr tablet Take 0.5 tablets (12.5 mg total) by mouth daily. 45 tablet 3  . Multiple Vitamin (MULTIVITAMIN) tablet Take 1 tablet by mouth daily.      . potassium chloride SA (KLOR-CON M20) 20 MEQ tablet Take 1 tablet (20 mEq total) by mouth 2 (two) times daily. 180 tablet 3  . predniSONE (DELTASONE) 5 MG  tablet Take  5 mg by mouth daily.      . sildenafil (REVATIO) 20 MG tablet Take 1 tablet (20 mg total) by mouth 3 (three) times daily. 270 tablet 3  . Vitamin D, Cholecalciferol, 400 units CAPS Take by mouth.    . warfarin (COUMADIN) 5 MG tablet Take as directed by the anticoagulation clinic. 135 tablet 3   No current facility-administered medications for this visit.     Allergies as of 12/06/2017 - Review Complete 12/06/2017  Allergen Reaction Noted  . Cephalexin Other (See Comments) 04/22/2016  . Methotrexate      Vitals: BP 110/75   Pulse 90   Ht 5\' 4"  (1.626 m)   Wt 178 lb (80.7 kg)   BMI 30.55 kg/m  Last Weight:  Wt Readings from Last 1 Encounters:  12/06/17 178 lb (80.7 kg)   QMV:HQIO mass index is 30.55 kg/m.     Last Height:   Ht Readings from Last 1 Encounters:  12/06/17 5\' 4"  (1.626 m)    Physical exam:  General: The patient is awake, alert and appears not in acute distress. The patient is well groomed. Head: Normocephalic, atraumatic. Neck is supple. Mallampati 2,  neck circumference: 13.5. Nasal airflow patent , TMJ is is  evident , restricted mouth opening. . Retrognathia is not seen.  Cardiovascular:  Regular rate and rhythm, without  murmurs or carotid bruit, and without distended neck veins. Respiratory: Lungs are clear to auscultation. Skin:  Without evidence of edema, or rash- facial scarring form lupus discoides.  Trunk:  The patient's posture is erect    Neurologic exam : The patient is awake and alert, oriented to place and time.   Attention span & concentration ability appears normal.  Speech is fluent,  with dysphonia.Her upper lip is stiffened by lupoid skin changes  Mood and affect are appropriate.  Cranial nerves: Taste and smell are preserved. Pupils are equal and briskly reactive to light. She is bothered by bright lights, has bilaterally cataracts in "infancy "  Extraocular movements in vertical and horizontal planes intact , without nystagmus.Visual  fields by finger perimetry are intact. Hearing to finger rub intact. Facial sensation intact to fine touch.Facial motor strength is symmetric -and tongue and uvula move midline. Shoulder shrug was symmetrical.   Motor exam:  Normal tone, muscle bulk and symmetric strength in all extremities. Sensory:  Fine touch, pinprick and vibration were tested in all extremities. Proprioception tested in the upper extremities was normal. Coordination: Finger-to-nose maneuver  normal without evidence of ataxia, dysmetria or tremor. Gait and station: Patient walks without assistive device . Strength within normal limits.  Stance is stable and normal.  Deep tendon reflexes: in the  upper and lower extremities are symmetric and intact.   The patient was advised of the nature of the diagnosed sleep disorder , the treatment options and risks for general a health and wellness arising from not treating the condition.  I spent more than 35 minutes of face to face time with the patient and confirming her history making necessary changes.   Greater than 50% of time was spent in counseling and coordination of care. We have discussed the diagnosis and differential and I answered the patient's questions.    1) Mrs. Hollon spells could indeed be related to sleep deprivation .Her attended sleep study has not shown any significant organic sleep disorder-I reviewed her sleep study from 20 May 2016, which has revealed only an AHI of 2.9 no additional respiratory disturbances,  REM AHI was 17.9, respiratory rate was 19/min no evidence of CO2 retention, no overall increased total time in desaturation.  Snoring was very mild.  There were no significant periodic limb movements of sleep noted.  EKG was a normal sinus rhythm, occasional for the non-restorative nature of the patient's sleep could not be identified.  Her sleep was fragmented as we could see in the sleep lab with a sleep efficiency of only 67% took a 42 minutes to go to  sleep and during the sleep time she woke up many times.  Her spells  were clearly non-vertigenous, and we ruled out TIAs or seizures. She needs to cut out caffeine and increase oral hydration.   Dr. Shelia Media also allowed me to see the most recent and extensive laboratory tests for this patient.  These were dated from 28 September 2017 they include a normal comprehensive metabolic panel, fasting glucose was 76, normal calcium creatinine was 0.67, lipid panel total cholesterol 173, LDL 110 HDL 48, triglycerides 74.  Urine showed mild proteinuria.  CBC and differential showed a white blood cell count of 3.6, red blood cell count of 4.24, hemoglobin 12.8, cream adequate 40.4.  MCHC was 31 and low, neutrophils were 49.2 and normal range, monocytosis is noted the percentage of monocytes overall blood cells was 14.9% neutrophils tend to be lower.  C-reactive protein was high at 12 mg/dL anti-double strength DNA was 23 and elevated, complement C4 was normal.  I did not see a ferritin level for a recent thyroid hormone check.  Cardiology: Dr . Aundra Dubin, NOTE from 10-24-2017 ,  52 y.o. 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    Echo in 5/17 showed EF 60-65%, normal mechanical mitral valve, and PA systolic pressure 33 mmHg with normal RV.  She has been stable symptomatically.  She continues to drive a school bus.  No dyspnea walking on flat ground, short of breath walking up stairs.  This has been stable. Rare sharp, momentary left-sided chest pain.  No orthopnea/PND.  Weight stable. She reports occasional "dizzy spells," no particular trigger.  When she has these spells she will also feel "sleepy."  She had a negative sleep study in 2017 per her  report.   ECG (personally reviewed): NSR, low voltage.   Labs (8/11): HCT 27 => 30, creatinine 0.98, INR 4 Labs (9/11): K 4.6, creatinine 0.9, HCT 32.7, BNP 214=>146, LFTs normal, TSH normal Labs (1/12): HCT 37.9, K 3.5, creatinine 0.9 Labs (4/12): LDL 108, HDL 43, TSH normal, K 4.1, creatinine 0.8, HCT 37.9 Labs (1/13): K 3.8, creatinine 0.8 Labs (11/14): K 3.6, creatinine 0.71, LDL 94, HCT 38 Labs (12/15): Hgb 12.5 Labs (2/16): K 3.9, creatinine 0.84 Labs (8/16): K 3.9, creatinine 0.87, HCT 38.6 Labs (4/17): K 3.6, creatinine 0.88, LDL 92, HDL 38, hgb 11.7 Labs (10/17): K 3.6, creatinine 0.81, hgb 12.1  Allergies (verified):  1)  ! Methotrexate  Past Medical History: 1. Mixed connective tissue disease: diagnosis 1990. 8/10 ANA positive, anti-dsDNA positive, anti-SCL70 negative 2. 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      - PFT's 03/06/99  VC 47%  DLC0 26%    - PFT's 09/12/08  VC 43%  DLC0 45%    - PFT's 6/10 FVC 37%, FEV1 34%,  ratio 69%, TLC 50%    - PFTs 11/15 FVC 39%, FEV1 35%, ratio 89%, TLC 63%, DLCO 48% 4. Nephrotic syndrome: secondary to SLE  5. CHF: Secondary to moderate mitral stenosis and moderate mitral regurgitation in the setting of rheumatic mitral valve disease as well as severe pulmonary HTN likely due to a combination of MV disease and pulmonary arterial HTN in the setting of collagen vascular disease.   Not valvuloplasty candidate due to MR.  Patient had MV replacement with mechanical MV in 8/11.   Echo (8/11) post-op showed normally functioning mechanical MV, EF 60-65% with septal bounce, small mobile structure in the mid ventricle (? loose papillary muscle), RV-RA gradient 22 mmHg.   Echo (10/11): EF 60-65%, normally functioning mechanical mitral valve.  Echo (7/13): EF 55-60%, mechanical mitral valve with mean gradient 4 mmHg, PA systolic pressure 29 mmHg. Echo (5/15) with EF 55-60%, mechanical mitral  valve functioning normally, normal RV size and systolic function, PA systolic pressure 32 mmHg.  - Echo in 5/17 showed EF 60-65%, normal mechanical mitral valve, and PA systolic pressure 33 mmHg with normal RV.   6.  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  CTA chest (7/10) with no evidence for pulmonary emboli or chronic pulmonary emboli.  PA pressure 72/28 mean 50 with PVR 5.7 WU on initial RHC, PA pressure 45/21 mean 30 PVR 2.3 WU on f/u RHC on Revatio 80 mg three times a day (1/11).  After MV surgery, Revatio stopped.  Increased dyspnea.  Repeat RHC with mean RA 6, PA 42/20 (mean 29), PVR 4.8 WU, CI 2.3.  Revatio 20 mg three times a day restarted.  Echo (9/89) with PA systolic pressure 29 mmHg. Echo (2/11) with PA systolic pressure 32 mmHg.  - 3/11 6 min walk 427 m - 2/12 6 min walk 427 m - 1/13 6 min walk 411 m - 4/15 6 min walk 448 m - 2/16 6 min walk 421 m - 8/16 6 min walk 354 m - 9/17 6 min walk 436 m - 1/19 6 min walk 366 m 7.  Rheumatic fever 8.  LHC (9/10): no angiographic CAD.  9.  Warfarin anticoagulation for mechanical MV, goal INR 2.5-3.5 10.  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  11. 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 C difficile diarrhea 1/12 13. GERD    Assessment:  After physical and neurologic examination, review of laboratory studies,  Personal review of imaging studies, reports of other /same  Imaging studies ,  Results of polysomnography/ neurophysiology testing and pre-existing records as far as provided in visit., my assessment is :  Plan:  Treatment plan and additional workup : Insomnia , multifactorial- but no physiological organic reason was identified. Her pulmonary, cardiac and rheumatological conditions play a role, as they likely do in dizziness.   Will refer for sleep behavior  modification.  I gave her the good sleep guide  Her son goes to bed at 8.30- we may need to set her bed time a little later to allow her to sleep longer, but set routines. 10 Pm to 4 . 30 AM   No Screen, no TV for 30 minutes prior to sleep routine, hot shower or bath at night. I gave her a list of sleep habits to develop.   PS : Melatonin has not helped. She may use    Prn RV    Asencion Partridge Tarvis Blossom  MD  01/17/8118   CC: Jodi Marble, MD - [facility]   CC: Loralie Champagne   CC; Mallie Mussel, MD  Cardio-  thoracic surgery    Thressa Sheller, [place], [place], [place] [address]

## 2018-01-16 ENCOUNTER — Telehealth (HOSPITAL_COMMUNITY): Payer: Self-pay | Admitting: Pharmacist

## 2018-01-16 NOTE — Telephone Encounter (Signed)
Revatio patient assistance approved by [employer] through 10/10/18.   Katie [name]. Velva Harman, PharmD, BCPS, CPP Clinical Pharmacist Phone: [phone] 01/16/2018 10:08 AM

## 2018-02-02 ENCOUNTER — Other Ambulatory Visit: Payer: Self-pay | Admitting: Obstetrics and Gynecology

## 2018-02-02 DIAGNOSIS — Z1231 Encounter for screening mammogram for malignant neoplasm of breast: Secondary | ICD-10-CM

## 2018-02-21 ENCOUNTER — Other Ambulatory Visit (HOSPITAL_COMMUNITY): Payer: Self-pay | Admitting: *Deleted

## 2018-02-21 MED ORDER — POTASSIUM CHLORIDE CRYS ER 20 MEQ PO TBCR: 20.0000 meq | EXTENDED_RELEASE_TABLET | Freq: Two times a day (BID) | ORAL | 3 refills | Status: DC

## 2018-02-21 MED ORDER — METOPROLOL SUCCINATE ER 25 MG PO TB24: 12.5000 mg | ORAL_TABLET | Freq: Every day | ORAL | 3 refills | Status: DC

## 2018-02-21 MED ORDER — FUROSEMIDE 20 MG PO TABS: ORAL_TABLET | ORAL | 3 refills | Status: DC

## 2018-02-23 ENCOUNTER — Ambulatory Visit
Admission: RE | Admit: 2018-02-23 | Discharge: 2018-02-23 | Disposition: A | Discharge status: Home / Self Care | Payer: BC Managed Care – PPO | Source: Ambulatory Visit | Attending: Obstetrics and Gynecology | Admitting: Obstetrics and Gynecology

## 2018-02-23 DIAGNOSIS — Z1231 Encounter for screening mammogram for malignant neoplasm of breast: Secondary | ICD-10-CM

## 2018-04-14 ENCOUNTER — Other Ambulatory Visit (HOSPITAL_COMMUNITY): Payer: Self-pay | Admitting: Internal Medicine

## 2018-04-15 ENCOUNTER — Other Ambulatory Visit (HOSPITAL_COMMUNITY): Payer: Self-pay | Admitting: Internal Medicine

## 2018-05-04 ENCOUNTER — Other Ambulatory Visit: Payer: Self-pay | Admitting: Obstetrics and Gynecology

## 2018-06-01 ENCOUNTER — Other Ambulatory Visit: Payer: Self-pay | Admitting: Obstetrics and Gynecology

## 2018-08-01 ENCOUNTER — Ambulatory Visit (HOSPITAL_COMMUNITY)
Admission: RE | Admit: 2018-08-01 | Discharge: 2018-08-01 | Disposition: A | Discharge status: Home / Self Care | Payer: BC Managed Care – PPO | Source: Ambulatory Visit | Attending: Cardiology | Admitting: Cardiology

## 2018-08-01 VITALS — BP 110/70 | HR 80 | Wt 175.8 lb

## 2018-08-01 DIAGNOSIS — N049 Nephrotic syndrome with unspecified morphologic changes: Secondary | ICD-10-CM | POA: Insufficient documentation

## 2018-08-01 DIAGNOSIS — Z952 Presence of prosthetic heart valve: Secondary | ICD-10-CM | POA: Insufficient documentation

## 2018-08-01 DIAGNOSIS — I7300 Raynaud's syndrome without gangrene: Secondary | ICD-10-CM | POA: Diagnosis not present

## 2018-08-01 DIAGNOSIS — I4510 Unspecified right bundle-branch block: Secondary | ICD-10-CM | POA: Diagnosis not present

## 2018-08-01 DIAGNOSIS — I340 Nonrheumatic mitral (valve) insufficiency: Secondary | ICD-10-CM | POA: Insufficient documentation

## 2018-08-01 DIAGNOSIS — Z7982 Long term (current) use of aspirin: Secondary | ICD-10-CM | POA: Diagnosis not present

## 2018-08-01 DIAGNOSIS — Z8249 Family history of ischemic heart disease and other diseases of the circulatory system: Secondary | ICD-10-CM | POA: Diagnosis not present

## 2018-08-01 DIAGNOSIS — K219 Gastro-esophageal reflux disease without esophagitis: Secondary | ICD-10-CM | POA: Diagnosis not present

## 2018-08-01 DIAGNOSIS — J8410 Pulmonary fibrosis, unspecified: Secondary | ICD-10-CM | POA: Insufficient documentation

## 2018-08-01 DIAGNOSIS — I5032 Chronic diastolic (congestive) heart failure: Secondary | ICD-10-CM

## 2018-08-01 DIAGNOSIS — I270 Primary pulmonary hypertension: Secondary | ICD-10-CM | POA: Diagnosis not present

## 2018-08-01 DIAGNOSIS — M329 Systemic lupus erythematosus, unspecified: Secondary | ICD-10-CM | POA: Diagnosis not present

## 2018-08-01 DIAGNOSIS — I059 Rheumatic mitral valve disease, unspecified: Secondary | ICD-10-CM

## 2018-08-01 DIAGNOSIS — Z79899 Other long term (current) drug therapy: Secondary | ICD-10-CM | POA: Diagnosis not present

## 2018-08-01 DIAGNOSIS — Z7901 Long term (current) use of anticoagulants: Secondary | ICD-10-CM | POA: Diagnosis not present

## 2018-08-01 DIAGNOSIS — I509 Heart failure, unspecified: Secondary | ICD-10-CM | POA: Diagnosis not present

## 2018-08-01 DIAGNOSIS — Z7952 Long term (current) use of systemic steroids: Secondary | ICD-10-CM | POA: Insufficient documentation

## 2018-08-01 DIAGNOSIS — I00 Rheumatic fever without heart involvement: Secondary | ICD-10-CM

## 2018-08-01 DIAGNOSIS — I272 Pulmonary hypertension, unspecified: Secondary | ICD-10-CM | POA: Diagnosis not present

## 2018-08-01 LAB — BASIC METABOLIC PANEL
Anion gap: 6 (ref 5–15)
BUN: 11 mg/dL (ref 6–20)
CALCIUM: 9 mg/dL (ref 8.9–10.3)
CO2: 26 mmol/L (ref 22–32)
CREATININE: 0.84 mg/dL (ref 0.44–1.00)
Chloride: 105 mmol/L (ref 98–111)
GFR calc non Af Amer: >60 mL/min (ref >60)
GLUCOSE: 102 mg/dL — AB (ref 70–99)
Potassium: 4.7 mmol/L (ref 3.5–5.1)
SODIUM: 137 mmol/L (ref 135–145)

## 2018-08-01 LAB — CBC
HEMATOCRIT: 40.1 % (ref 36.0–46.0)
HEMOGLOBIN: 13 g/dL (ref 12.0–15.0)
MCH: 34 pg (ref 26.0–34.0)
MCHC: 32.4 g/dL (ref 30.0–36.0)
MCV: 105 fL — AB (ref 80.0–100.0)
Platelets: 240 10*3/uL (ref 150–400)
RBC: 3.82 MIL/uL — ABNORMAL LOW (ref 3.87–5.11)
RDW: 15.4 % (ref 11.5–15.5)
WBC: 2.8 10*3/uL — AB (ref 4.0–10.5)
nRBC: 0 % (ref 0.0–0.2)

## 2018-08-01 LAB — BRAIN NATRIURETIC PEPTIDE: B Natriuretic Peptide: 111.4 pg/mL — ABNORMAL HIGH (ref 0.0–100.0)

## 2018-08-01 NOTE — Progress Notes (Signed)
6 min walk test complete.  Pt ambulated 1320 ft (402 m).  On RA sats ranged 89-100%, HR ranged 83-115.

## 2018-08-01 NOTE — Patient Instructions (Signed)
Labs done today  We will contact you in 6 months to schedule your next appointment and echocardiogram

## 2018-08-01 NOTE — Progress Notes (Signed)
Patient ID: Katie [name], female   DOB: 1966/06/13, 52 y.o.   MRN: [id number] PCP: Dr. Shelia Media Cardiology: Dr . Aundra Dubin  52 y.o. 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    Echo in 1/19 showed EF 60-65%, mechanical mitral valve with mean gradient 5 mmHg, and PA systolic pressure 33 mmHg with normal RV.   She has been stable symptomatically.  She continues to drive a school bus.  She walks for exercise for 25-30 minutes on most days. No dyspnea walking on flat ground.  She can walk up a flight of stairs now without dyspnea. No lightheadedness.  No orthopnea/PND.  No BRBPR/melena.  Rare atypical chest pain.   Labs (8/11): HCT 27 => 30, creatinine 0.98, INR 4 Labs (9/11): K 4.6, creatinine 0.9, HCT 32.7, BNP 214=>146, LFTs normal, TSH normal Labs (1/12): HCT 37.9, K 3.5, creatinine 0.9 Labs (4/12): LDL 108, HDL 43, TSH normal, K 4.1, creatinine 0.8, HCT 37.9 Labs (1/13): K 3.8, creatinine 0.8 Labs (11/14): K 3.6, creatinine 0.71, LDL 94, HCT 38 Labs (12/15): Hgb 12.5 Labs (2/16): K 3.9, creatinine 0.84 Labs (8/16): K 3.9, creatinine 0.87, HCT 38.6 Labs (4/17): K 3.6, creatinine 0.88, LDL 92, HDL 38, hgb 11.7 Labs (10/17): K 3.6, creatinine 0.81, hgb 12.1  ECG (personally reviewed): NSR, RBBB  Allergies (verified):  1)  ! Methotrexate  Past Medical History: 1. Mixed connective tissue disease: diagnosis 1990. 8/10 ANA positive, anti-dsDNA positive, anti-SCL70 negative 2. 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     - PFT's 03/06/99  VC 47%  DLC0 26%    - PFT's 09/12/08  VC 43%  DLC0 45%    - PFT's 6/10 FVC 37%, FEV1 34%, ratio 69%, TLC 50%    - PFTs 11/15 FVC 39%, FEV1  35%, ratio 89%, TLC 63%, DLCO 48% 4. 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 CHF: Secondary to moderate mitral stenosis and moderate mitral regurgitation in the setting of rheumatic mitral valve disease as well as severe pulmonary HTN likely due to a combination of MV disease and pulmonary arterial HTN in the setting of collagen vascular disease.   Not valvuloplasty candidate due to MR.  Patient had MV replacement with mechanical MV in 8/11.  Echo (8/11) post-op showed normally functioning mechanical MV, EF 60-65% with septal bounce, small mobile structure in the mid ventricle (? loose papillary muscle), RV-RA gradient 22 mmHg.  Echo (10/11): EF 60-65%, normally functioning mechanical mitral valve.  Echo (7/13): EF 55-60%, mechanical mitral valve with mean gradient 4 mmHg, PA systolic pressure 29 mmHg. Echo (5/15) with EF 55-60%, mechanical mitral valve functioning normally, normal RV size and systolic function, PA systolic pressure 32 mmHg.  - Echo in 5/17 showed EF 60-65%, normal mechanical mitral valve, and PA systolic pressure 33 mmHg with normal RV.  - Echo (1/19): EF 60-65%, mild LVH, normal RV size and systolic function, mechanical mitral valve with mean gradient 6 mmHg, PASP 33 mmHg.  6.  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  CTA chest (7/10) with no evidence for pulmonary emboli or chronic pulmonary emboli.  PA pressure 72/28 mean 50 with PVR 5.7 WU on initial RHC, PA pressure 45/21 mean 30 PVR 2.3  WU on f/u RHC on Revatio 80 mg three times a day (1/11).  After MV surgery, Revatio stopped.  Increased dyspnea.  Repeat RHC with mean RA 6, PA 42/20 (mean 29), PVR 4.8 WU, CI 2.3.  Revatio 20 mg three times a day restarted.  Echo (2/11) with PA systolic pressure 29 mmHg. Echo (9/41) with PA systolic pressure 32 mmHg.  - 3/11 6 min walk 427 m - 2/12 6 min walk 427 m - 1/13 6 min walk 411 m - 4/15 6 min walk 448  m - 2/16 6 min walk 421 m - 8/16 6 min walk 354 m - 9/17 6 min walk 436 m - 1/19 6 min walk 366 m - 10/19 6 min walk 402 m 7.  Rheumatic fever 8.  LHC (9/10): no angiographic CAD.  9.  Warfarin anticoagulation for mechanical MV, goal INR 2.5-3.5 10.  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  11. 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 C difficile diarrhea 1/12 13. GERD 14. Holter (1/19): No significant arrhythmias.   Family History: Emphysema mother, smoker.  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   ROS: All systems reviewed and negative except as noted in HPI.   Current Outpatient Medications  Medication Sig Dispense Refill  . acetaminophen (TYLENOL) 325 MG tablet Take 650 mg by mouth. As directed as needed     . aspirin 81 MG EC tablet Take 81 mg by mouth daily.      Marland Kitchen azaTHIOprine (IMURAN) 50 MG tablet Take 100 mg by mouth daily.    . Calcium Citrate-Vitamin D (CALCIUM + D PO) Take by mouth.    . furosemide (LASIX) 20 MG tablet TAKE 1 TABLET (20 MG TOTAL) BY MOUTH DAILY. 90 tablet 3  . hydroxychloroquine (PLAQUENIL) 200 MG tablet Take 2 tablets daily    . magnesium oxide (MAG-OX) 400 MG tablet Take 400 mg by mouth daily.    . metoprolol succinate (TOPROL-XL) 25 MG 24 hr tablet Take 0.5 tablets (12.5 mg total) by mouth daily. 45 tablet 3  . Multiple Vitamin (MULTIVITAMIN) tablet Take 1 tablet by mouth daily.      . potassium chloride SA (KLOR-CON M20) 20 MEQ tablet Take 1 tablet (20 mEq total) by mouth 2 (two) times daily. 180 tablet 3  . predniSONE (DELTASONE) 5 MG tablet Take 5 mg by mouth daily.      . sildenafil (REVATIO) 20 MG tablet Take 1 tablet (20 mg total) by mouth 3 (three) times daily. 270 tablet 3  . Vitamin D, Cholecalciferol, 400 units CAPS Take by mouth.    . warfarin (COUMADIN) 5 MG tablet Take as directed by the  anticoagulation clinic. 135 tablet 3   No current facility-administered medications for this encounter.     BP 110/70   Pulse 80   Wt 79.7 kg (175 lb 12.8 oz)   BMI 30.18 kg/m  General: NAD Neck: No JVD, no thyromegaly or thyroid nodule.  Lungs: Clear to auscultation bilaterally with normal respiratory effort. CV: Nondisplaced PMI.  Heart regular S1/S2 with mechanical S1 no S3/S4, 1/6 SEM RUSB.  No peripheral edema.  No carotid bruit.  Normal pedal pulses.  Abdomen: Soft, nontender, no hepatosplenomegaly, no distention.  Skin: Intact without lesions or rashes.  Neurologic: Alert and oriented x 3.  Psych: Normal affect. Extremities: No clubbing or cyanosis.  HEENT: Normal.   Assessment/Plan:  1. Status post  mechanical MVR:  Mitral valve replacement, stable from valve standpoint.  She is on coumadin goal INR 2.5 - 3.5 and ASA 81 mg daily.  - Check CBC today.   2.  Pulmonary HTN:  Pulmonary HTN is likely a mixed picture, from mitral valve disease as well as mixed connective tissue disease.  She had mild residual pulmonary HTN with moderately increased PVR even after mitral valve surgery on last RHC though this was improved compared to initial RHC.  The residual pulmonary HTN may be due to pulmonary vascular remodeling with chronic left atrial pressure elevation in the setting of mitral valve disease.  Revatio was restarted at 20 mg tid.  Most recent echo in 7/82 showed PA systolic pressure 33 mmHg.  RV looked normal.   - 6 minute walk today.  - Will repeat echo in 6 months at followup. - Continue current Lasix 20 mg daily and check BMET.  - Continue current Revatio. 3. Mixed connective tissue disorder: Patient has history of pulmonary fibrosis. Followed by pulmonary and rheumatology at [facility].   Followup in 6 months with echo.   Loralie Champagne 08/01/2018

## 2018-09-22 ENCOUNTER — Telehealth (HOSPITAL_COMMUNITY): Payer: Self-pay | Admitting: Pharmacist

## 2018-09-22 NOTE — Telephone Encounter (Signed)
Filled out provider portion of [employer] patient assistance application for Revatio. Patient wanted our portion back to fax in herself. Called and left VM for her to pick up at our front desk.   Ruta Hinds. Velva Harman, PharmD, BCPS, CPP Clinical Pharmacist Phone: [phone] 09/22/2018 9:48 AM

## 2018-11-09 ENCOUNTER — Other Ambulatory Visit (HOSPITAL_COMMUNITY): Payer: Self-pay

## 2018-11-09 ENCOUNTER — Telehealth (HOSPITAL_COMMUNITY): Payer: Self-pay | Admitting: Licensed Clinical Social Worker

## 2018-11-09 MED ORDER — SILDENAFIL CITRATE 20 MG PO TABS: 20.0000 mg | ORAL_TABLET | Freq: Three times a day (TID) | ORAL | 3 refills | Status: DC

## 2018-11-09 NOTE — Telephone Encounter (Signed)
Clinic received message from [employer] pt assistance stating that they were missing needed documentation to finalize pt application.  CSW called and spoke with representative who states they need a signed script from MD to finish application.  Script sent in to [employer] for review- CSW will continue to follow and assist as needed  Jorge Ny, LCSW Clinical Social Worker [facility] [phone]

## 2018-11-09 NOTE — Telephone Encounter (Signed)
Printed for patient assistance program

## 2018-11-22 NOTE — Telephone Encounter (Signed)
Received fax from [employer] has been approved to receive Revatio at no cost until 10/11/2019 through them.

## 2018-12-01 ENCOUNTER — Telehealth: Payer: Self-pay | Admitting: Oncology

## 2018-12-01 ENCOUNTER — Encounter: Payer: Self-pay | Admitting: Oncology

## 2018-12-01 NOTE — Telephone Encounter (Signed)
On 11/14/18, a new patient appt has been scheduled for the pt to see Dr. Alen Blew on 3/3 at 11:15am. Letter mailed to the pt. Referral received from Dr. Shelia Media at [facility] for low wbc.

## 2018-12-12 ENCOUNTER — Inpatient Hospital Stay: Payer: BC Managed Care – PPO | Attending: Oncology | Admitting: Oncology

## 2018-12-12 VITALS — BP 107/76 | HR 83 | Temp 97.9°F | Resp 18 | Ht 64.0 in | Wt 173.7 lb

## 2018-12-12 DIAGNOSIS — I7300 Raynaud's syndrome without gangrene: Secondary | ICD-10-CM | POA: Insufficient documentation

## 2018-12-12 DIAGNOSIS — I509 Heart failure, unspecified: Secondary | ICD-10-CM | POA: Insufficient documentation

## 2018-12-12 DIAGNOSIS — D7589 Other specified diseases of blood and blood-forming organs: Secondary | ICD-10-CM | POA: Insufficient documentation

## 2018-12-12 DIAGNOSIS — M329 Systemic lupus erythematosus, unspecified: Secondary | ICD-10-CM | POA: Diagnosis not present

## 2018-12-12 DIAGNOSIS — M351 Other overlap syndromes: Secondary | ICD-10-CM | POA: Insufficient documentation

## 2018-12-12 DIAGNOSIS — I272 Pulmonary hypertension, unspecified: Secondary | ICD-10-CM | POA: Diagnosis not present

## 2018-12-12 DIAGNOSIS — Z79899 Other long term (current) drug therapy: Secondary | ICD-10-CM | POA: Diagnosis not present

## 2018-12-12 DIAGNOSIS — D72819 Decreased white blood cell count, unspecified: Secondary | ICD-10-CM | POA: Insufficient documentation

## 2018-12-12 DIAGNOSIS — I2510 Atherosclerotic heart disease of native coronary artery without angina pectoris: Secondary | ICD-10-CM | POA: Diagnosis not present

## 2018-12-12 DIAGNOSIS — Z7901 Long term (current) use of anticoagulants: Secondary | ICD-10-CM | POA: Insufficient documentation

## 2018-12-12 DIAGNOSIS — I052 Rheumatic mitral stenosis with insufficiency: Secondary | ICD-10-CM | POA: Diagnosis not present

## 2018-12-12 NOTE — Progress Notes (Signed)
Reason for the request:   Leukocytopenia  HPI: I was asked by Dr. Shelia Media to evaluate Katie [name] for leukocytopenia.  She is a 53 year old woman with a history of a systemic lupus and mixed connective tissue disease diagnosed in the 1990s.  She has been on Plaquenil and under the follow-up of rheumatology.  In the last year or so Imuran was added to her regimen initially at 50 mg daily and the dose was escalated 200 mg because of increased knee pain related to systemic lupus.  CBC obtained in October 2019 showed a white cell count of 2.8, hemoglobin of 13 and MCV 105 with a platelet count of 240.  CBC obtained on August 28, 2018 showed a white cell count of 2.9, hemoglobin of 12 with an MCV of 102.9.  Her neutrophil percentage were 58.9%.  Repeat CBC on October 12, 2018 showed a white cell count of 2.6, hemoglobin 12.4 and a platelet count of 1.  Her MCV was 102.7.  Neutrophil percentage was slightly down at 31% with slight monocytosis at 24%.  Clinically she is asymptomatic at this point without any recurrent infections or hospitalizations.  She continues to have issues with knee pain that has not improved on Imuran.  She does not report any headaches, blurry vision, syncope or seizures. Does not report any fevers, chills or sweats.  Does not report any cough, wheezing or hemoptysis.  Does not report any chest pain, palpitation, orthopnea or leg edema.  Does not report any nausea, vomiting or abdominal pain.  Does not report any constipation or diarrhea.  Does not report any skeletal complaints.    Does not report frequency, urgency or hematuria.  Does not report any skin rashes or lesions. Does not report any heat or cold intolerance.  Does not report any lymphadenopathy or petechiae.  Does not report any anxiety or depression.  Remaining review of systems is negative.    Past Medical History:  Diagnosis Date  . Abscess of right breast    sternal osteomyeliitis, pseudomonas  . C. difficile diarrhea  1/12  . CAD (coronary artery disease)    LHC (9/10): no angiographic CAD  . 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   . CHF (congestive heart failure) (HCC)    secondary to mmoderate mitral stenosis and moderate mitral regurgitation in the setting of rheumatic vlave disease as well as severe pulmonary HTN likely due to a combination of MV disease and pulmoanary arterial HTN in the setting of collagen vascular disease. Not valvuloplasty candidate due to MR. patient had MV replacement with mechanical MV in 8/11.  Marland Kitchen CHF (congestive heart failure) (HCC)    Echo post-op showed normally functioning mechanical MV, EF 60-65% with septal bounce, small mobile structure in the mid ventricle (? loose papillary muscle), RV-RA gradient 22 mmHg. Echo (10/11): EF 60-65%, normally functioning mechanical mitral vlavue.   . Mixed connective tissue disease ([place])    diagnosis 1990. 8/10 ANA positive, anti-dDNA positive, anti-SCL70 negative  . 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 Pulmonary HTN ([place])    porbably secondary to combination of rheumatic MV disease and pulmonary arterial HYN form vollagen vascalr disease. CTA chest (7/10) with no evidence for pulmonary emboli or chronic pulmonary emboli. PA pressure 72/28 mean 50 with PVR 5.7 WU on initial RHC, PA pressure 45/21 mean 30 PVR 2.3 WU on f/u RHC on Revatio 80 mg three times a day (1/11).  After MV surgery,  Revatio stopped.  . Pulmonary HTN ([place])    increased  dyspnea. Repeat RHC with mean RA 6, PA 42/20 mean 29, PVR 4.8 WU,  2.3. Revatio 20 mg  3x a day restarted. 3/11 6 min walk 464m. 2/12 6 min walk 427 m.   . Raynaud's syndrome   . Restrictive lung disease    secondary to MCTD. PFTs 03/06/99 VC 47% DLCO 26%. PFTs 09/12/08 VC 43% DLCO 45%. PFTs 6/10 FVC 37%, FEV1 34%, ratio 69%, TLC 50%. using home O2 periodically  . Rheumatic fever   . Warfarin anticoagulation    for mechanical MV, goal INR 2.5-3.5   :  Past Surgical History:  Procedure Laterality Date  . BREAST CYST ASPIRATION  03/09/2012  . CESAREAN SECTION    . COLONOSCOPY    . s/p mitral valvue replacement  05/13/2010   by Dr. Roxy Manns by minimally invasive window.   . TUBAL LIGATION    :   Current Outpatient Medications:  .  acetaminophen (TYLENOL) 325 MG tablet, Take 650 mg by mouth. As directed as needed , Disp: , Rfl:  .  aspirin 81 MG EC tablet, Take 81 mg by mouth daily.  , Disp: , Rfl:  .  azaTHIOprine (IMURAN) 50 MG tablet, Take 100 mg by mouth daily., Disp: , Rfl:  .  Calcium Citrate-Vitamin D (CALCIUM + D PO), Take by mouth., Disp: , Rfl:  .  furosemide (LASIX) 20 MG tablet, TAKE 1 TABLET (20 MG TOTAL) BY MOUTH DAILY., Disp: 90 tablet, Rfl: 3 .  hydroxychloroquine (PLAQUENIL) 200 MG tablet, Take 2 tablets daily, Disp: , Rfl:  .  magnesium oxide (MAG-OX) 400 MG tablet, Take 400 mg by mouth daily., Disp: , Rfl:  .  metoprolol succinate (TOPROL-XL) 25 MG 24 hr tablet, Take 0.5 tablets (12.5 mg total) by mouth daily., Disp: 45 tablet, Rfl: 3 .  Multiple Vitamin (MULTIVITAMIN) tablet, Take 1 tablet by mouth daily.  , Disp: , Rfl:  .  potassium chloride SA (KLOR-CON M20) 20 MEQ tablet, Take 1 tablet (20 mEq total) by mouth 2 (two) times daily., Disp: 180 tablet, Rfl: 3 .  predniSONE (DELTASONE) 5 MG tablet, Take 5 mg by mouth daily.  , Disp: , Rfl:  .  sildenafil (REVATIO) 20 MG tablet, Take 1 tablet (20 mg total) by mouth 3 (three) times daily., Disp: 270 tablet, Rfl: 3 .  Vitamin D, Cholecalciferol, 400 units CAPS, Take by mouth., Disp: , Rfl:  .  warfarin (COUMADIN) 5 MG tablet, Take as directed by the anticoagulation clinic., Disp: 135 tablet, Rfl: 3:  Allergies  Allergen Reactions  . Cephalexin Other (See Comments)    HIVES  . Methotrexate     REACTION: intolerance  :  Family History  Problem Relation Age of Onset  . Emphysema Mother        smoker  . COPD Mother   . Heart disease Maternal Grandmother         side of family not given  . Diabetes Maternal Grandmother   . Hypertension Father   . Osteoarthritis Father   . Sleep apnea Father   . Breast cancer Maternal Aunt   :  Social History   Socioeconomic History  . Marital status: Single    Spouse name: Not on file  . Number of children: Not on file  . Years of education: Not on file  . Highest education level: Not on file  Occupational History  . Not on file  Social Needs  .  Financial resource strain: Not on file  . Food insecurity:    Worry: Not on file    Inability: Not on file  . Transportation needs:    Medical: Not on file    Non-medical: Not on file  Tobacco Use  . Smoking status: Never Smoker  . Smokeless tobacco: Never Used  Substance and Sexual Activity  . Alcohol use: No  . Drug use: No  . Sexual activity: Not Currently    Birth control/protection: Other-see comments    Comment: BTL  Lifestyle  . Physical activity:    Days per week: Not on file    Minutes per session: Not on file  . Stress: Not on file  Relationships  . Social connections:    Talks on phone: Not on file    Gets together: Not on file    Attends religious service: Not on file    Active member of club or organization: Not on file    Attends meetings of clubs or organizations: Not on file    Relationship status: Not on file  . Intimate partner violence:    Fear of current or ex partner: Not on file    Emotionally abused: Not on file    Physically abused: Not on file    Forced sexual activity: Not on file  Other Topics Concern  . 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   :  Pertinent items are noted in HPI.  Exam: Blood pressure 107/76, pulse 83, temperature 97.9 F (36.6 C), temperature source Oral, resp. rate 18, height 5\' 4"  (1.626 m), weight 173 lb 11.2 oz (78.8 kg), SpO2 95 %.  ECOG 1 General appearance: alert and cooperative appeared without distress. Head:  atraumatic without any abnormalities. Eyes: conjunctivae/corneas clear. PERRL.  Sclera anicteric. Throat: lips, mucosa, and tongue normal; without oral thrush or ulcers. Resp: clear to auscultation bilaterally without rhonchi, wheezes or dullness to percussion. Cardio: regular rate and rhythm, S1, S2 normal, no murmur, click, rub or gallop GI: soft, non-tender; bowel sounds normal; no masses,  no organomegaly Skin: Skin color, texture, turgor normal. No rashes or lesions Lymph nodes: Cervical, supraclavicular, and axillary nodes normal. Neurologic: Grossly normal without any motor, sensory or deep tendon reflexes. Musculoskeletal: No joint deformity or effusion.  CBC    Component Value Date/Time   WBC 2.8 (L) 08/01/2018 1131   RBC 3.82 (L) 08/01/2018 1131   HGB 13.0 08/01/2018 1131   HCT 40.1 08/01/2018 1131   PLT 240 08/01/2018 1131   MCV 105.0 (H) 08/01/2018 1131   MCH 34.0 08/01/2018 1131   MCHC 32.4 08/01/2018 1131   RDW 15.4 08/01/2018 1131   LYMPHSABS 1.3 02/06/2014 1229   MONOABS 0.6 02/06/2014 1229   EOSABS 0.0 02/06/2014 1229   BASOSABS 0.0 02/06/2014 1229     Assessment and Plan:   53 year old woman with the following:  1.  Leukocytopenia noted in October 2009 with a white cell count of 2.8 with normal differential.  She did have mild neutropenia associated with her counts in January 2020.  Her white cell count was normal in October 2017 with very mild leukocytopenia noted in 2015 with a white cell count of 4.1.  The differential diagnosis was reviewed today with the patient in detail.  Her leukocytopenia appears to be related to recent increase in her Imuran dose that was just added in the last year.  Autoimmune disorder and advised itself  can cause leukocytopenia although her white cell count has been reasonably normal for an extended period of time.  Hematological disorder shows myelodysplastic syndrome, leukemia, lymphoproliferative disorder is considered less  likely.    I do not recommend any further hematological evaluation at this time as I feel that this is a medication related issue.  Adjusting the Imuran dosing or switching to a different agent may be needed per her rheumatologist.  2.  Macrocytosis: Could be related to B12 deficiency or side effects related to her Imuran and Plaquenil.  Myelodysplastic syndrome is considered less likely at this time even her normal hemoglobin and platelet count.  3.  Mixed connective tissue disorder and systemic lupus: She continues to follow with rheumatology.  Adjusting her medication may be needed because of her leukocytopenia and lack of efficacy with Imuran.  4.  Follow-up: I am happy to see her in the future as needed if any additional hematological changes are noted.  40  minutes was spent with the patient face-to-face today.  More than 50% of time was dedicated to reviewing laboratory data, differential diagnosis and answering questions about future plan of care.   Thank you for the referral.  A copy of this consult has been forwarded to the requesting physician.

## 2018-12-13 ENCOUNTER — Telehealth: Payer: Self-pay | Admitting: Oncology

## 2018-12-13 NOTE — Telephone Encounter (Signed)
No 3/3 los

## 2019-02-08 ENCOUNTER — Other Ambulatory Visit: Payer: Self-pay | Admitting: Internal Medicine

## 2019-02-08 DIAGNOSIS — Z1231 Encounter for screening mammogram for malignant neoplasm of breast: Secondary | ICD-10-CM

## 2019-02-10 ENCOUNTER — Other Ambulatory Visit (HOSPITAL_COMMUNITY): Payer: Self-pay | Admitting: Cardiology

## 2019-04-04 ENCOUNTER — Other Ambulatory Visit: Payer: Self-pay

## 2019-04-04 ENCOUNTER — Ambulatory Visit
Admission: RE | Admit: 2019-04-04 | Discharge: 2019-04-04 | Disposition: A | Discharge status: Home / Self Care | Payer: BC Managed Care – PPO | Source: Ambulatory Visit | Attending: Internal Medicine | Admitting: Internal Medicine

## 2019-04-04 DIAGNOSIS — Z1231 Encounter for screening mammogram for malignant neoplasm of breast: Secondary | ICD-10-CM

## 2019-05-07 ENCOUNTER — Other Ambulatory Visit (HOSPITAL_COMMUNITY): Payer: Self-pay | Admitting: Cardiology

## 2019-05-22 ENCOUNTER — Other Ambulatory Visit: Payer: Self-pay

## 2019-05-22 ENCOUNTER — Ambulatory Visit (HOSPITAL_COMMUNITY)
Admission: RE | Admit: 2019-05-22 | Discharge: 2019-05-22 | Disposition: A | Discharge status: Home / Self Care | Payer: BC Managed Care – PPO | Source: Ambulatory Visit | Attending: Cardiology | Admitting: Cardiology

## 2019-05-22 ENCOUNTER — Encounter (HOSPITAL_COMMUNITY): Payer: Self-pay | Admitting: Cardiology

## 2019-05-22 VITALS — BP 114/68 | HR 88 | Wt 184.6 lb

## 2019-05-22 DIAGNOSIS — I272 Pulmonary hypertension, unspecified: Secondary | ICD-10-CM | POA: Insufficient documentation

## 2019-05-22 DIAGNOSIS — I5032 Chronic diastolic (congestive) heart failure: Secondary | ICD-10-CM | POA: Insufficient documentation

## 2019-05-22 DIAGNOSIS — M25569 Pain in unspecified knee: Secondary | ICD-10-CM | POA: Diagnosis not present

## 2019-05-22 DIAGNOSIS — J8410 Pulmonary fibrosis, unspecified: Secondary | ICD-10-CM | POA: Insufficient documentation

## 2019-05-22 DIAGNOSIS — I509 Heart failure, unspecified: Secondary | ICD-10-CM | POA: Diagnosis not present

## 2019-05-22 DIAGNOSIS — I7300 Raynaud's syndrome without gangrene: Secondary | ICD-10-CM | POA: Insufficient documentation

## 2019-05-22 DIAGNOSIS — Z952 Presence of prosthetic heart valve: Secondary | ICD-10-CM | POA: Insufficient documentation

## 2019-05-22 DIAGNOSIS — K219 Gastro-esophageal reflux disease without esophagitis: Secondary | ICD-10-CM | POA: Insufficient documentation

## 2019-05-22 DIAGNOSIS — Z7982 Long term (current) use of aspirin: Secondary | ICD-10-CM | POA: Insufficient documentation

## 2019-05-22 DIAGNOSIS — Z79899 Other long term (current) drug therapy: Secondary | ICD-10-CM | POA: Insufficient documentation

## 2019-05-22 DIAGNOSIS — I059 Rheumatic mitral valve disease, unspecified: Secondary | ICD-10-CM | POA: Diagnosis not present

## 2019-05-22 DIAGNOSIS — M3214 Glomerular disease in systemic lupus erythematosus: Secondary | ICD-10-CM | POA: Diagnosis not present

## 2019-05-22 DIAGNOSIS — Z7901 Long term (current) use of anticoagulants: Secondary | ICD-10-CM | POA: Diagnosis not present

## 2019-05-22 LAB — BASIC METABOLIC PANEL
Anion gap: 5 (ref 5–15)
BUN: 10 mg/dL (ref 6–20)
CO2: 27 mmol/L (ref 22–32)
Calcium: 8.9 mg/dL (ref 8.9–10.3)
Chloride: 108 mmol/L (ref 98–111)
Creatinine, Ser: 0.83 mg/dL (ref 0.44–1.00)
GFR calc Af Amer: >60 mL/min (ref >60)
GFR calc non Af Amer: >60 mL/min (ref >60)
Glucose, Bld: 92 mg/dL (ref 70–99)
Potassium: 4.7 mmol/L (ref 3.5–5.1)
Sodium: 140 mmol/L (ref 135–145)

## 2019-05-22 LAB — CBC
HCT: 43.1 % (ref 36.0–46.0)
Hemoglobin: 13.6 g/dL (ref 12.0–15.0)
MCH: 32.9 pg (ref 26.0–34.0)
MCHC: 31.6 g/dL (ref 30.0–36.0)
MCV: 104.1 fL — ABNORMAL HIGH (ref 80.0–100.0)
Platelets: 189 10*3/uL (ref 150–400)
RBC: 4.14 MIL/uL (ref 3.87–5.11)
RDW: 13.8 % (ref 11.5–15.5)
WBC: 4.3 10*3/uL (ref 4.0–10.5)
nRBC: 0 % (ref 0.0–0.2)

## 2019-05-22 NOTE — Patient Instructions (Addendum)
Labs today We will only contact you if something comes back abnormal or we need to make some changes. Otherwise no news is good news!  Your physician has requested that you have an echocardiogram. Echocardiography is a painless test that uses sound waves to create images of your heart. It provides your doctor with information about the size and shape of your heart and how well your heart's chambers and valves are working. This procedure takes approximately one hour. There are no restrictions for this procedure.  You will get a call to schedule this appointment.   Your physician recommends that you schedule a follow-up appointment in: 6 months with Dr Aundra Dubin, you will get a call to schedule this appointment.

## 2019-05-22 NOTE — Progress Notes (Signed)
6MW attempted. 5:15 minutes, 184meters completed Pt wanted to stop due to knee pain.   O2 sat ranges 88-100% HR 91-105bpm

## 2019-05-23 NOTE — Progress Notes (Signed)
Patient ID: Katie [name], female   DOB: October 11, 1966, 53 y.o.   MRN: [id number] PCP: Dr. Shelia Media Cardiology: Dr . Aundra Dubin  53 y.o. 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    Echo in 1/19 showed EF 60-65%, mechanical mitral valve with mean gradient 5 mmHg, and PA systolic pressure 33 mmHg with normal RV.   She has been on prednisone recently for flare of MCTD.  She also has severe right knee pain that she says has been attributed to osteoarthritis.  We tried a 6 minute walk today but it was significantly limited by knee pain.  No significant exertional dyspnea.  No chest pain.  No palpitations.  No orthopnea/PND.  No BRBPR/melena.   ECG (personally reviewed): NSR, RBBB  Labs (8/11): HCT 27 => 30, creatinine 0.98, INR 4 Labs (9/11): K 4.6, creatinine 0.9, HCT 32.7, BNP 214=>146, LFTs normal, TSH normal Labs (1/12): HCT 37.9, K 3.5, creatinine 0.9 Labs (4/12): LDL 108, HDL 43, TSH normal, K 4.1, creatinine 0.8, HCT 37.9 Labs (1/13): K 3.8, creatinine 0.8 Labs (11/14): K 3.6, creatinine 0.71, LDL 94, HCT 38 Labs (12/15): Hgb 12.5 Labs (2/16): K 3.9, creatinine 0.84 Labs (8/16): K 3.9, creatinine 0.87, HCT 38.6 Labs (4/17): K 3.6, creatinine 0.88, LDL 92, HDL 38, hgb 11.7 Labs (10/17): K 3.6, creatinine 0.81, hgb 12.1 Labs (1/20): K 4.3, creatinine 0.79, LDL 107, hgb 12  Allergies (verified):  1)  ! Methotrexate  Past Medical History: 1. Mixed connective tissue disease: diagnosis 1990. 8/10 ANA positive, anti-dsDNA positive, anti-SCL70 negative 2. 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     - PFT's 03/06/99  VC 47%  DLC0 26%    - PFT's 09/12/08  VC 43%  DLC0 45%    - PFT's  6/10 FVC 37%, FEV1 34%, ratio 69%, TLC 50%    - PFTs 11/15 FVC 39%, FEV1 35%, ratio 89%, TLC 63%, DLCO 48% 4. 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 CHF: Secondary to moderate mitral stenosis and moderate mitral regurgitation in the setting of rheumatic mitral valve disease as well as severe pulmonary HTN likely due to a combination of MV disease and pulmonary arterial HTN in the setting of collagen vascular disease.   Not valvuloplasty candidate due to MR.  Patient had MV replacement with mechanical MV in 8/11.  Echo (8/11) post-op showed normally functioning mechanical MV, EF 60-65% with septal bounce, small mobile structure in the mid ventricle (? loose papillary muscle), RV-RA gradient 22 mmHg.  Echo (10/11): EF 60-65%, normally functioning mechanical mitral valve.  Echo (7/13): EF 55-60%, mechanical mitral valve with mean gradient 4 mmHg, PA systolic pressure 29 mmHg. Echo (5/15) with EF 55-60%, mechanical mitral valve functioning normally, normal RV size and systolic function, PA systolic pressure 32 mmHg.  - Echo in 5/17 showed EF 60-65%, normal mechanical mitral valve, and PA systolic pressure 33 mmHg with normal RV.  - Echo (1/19): EF 60-65%, mild LVH, normal RV size and systolic function, mechanical mitral valve with mean gradient 6 mmHg, PASP 33 mmHg.  6.  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  CTA chest (7/10) with no evidence for pulmonary emboli or chronic pulmonary emboli.  PA pressure 72/28 mean  6 with PVR 5.7 WU on initial RHC, PA pressure 45/21 mean 30 PVR 2.3 WU on f/u RHC on Revatio 80 mg three times a day (1/11).  After MV surgery, Revatio stopped.  Increased dyspnea.  Repeat RHC with mean RA 6, PA 42/20 (mean 29), PVR 4.8 WU, CI 2.3.  Revatio 20 mg three times a day restarted.  Echo (4/66) with PA systolic pressure 29 mmHg. Echo (5/99) with PA systolic pressure 32 mmHg.  - 3/11 6 min walk 427  m - 2/12 6 min walk 427 m - 1/13 6 min walk 411 m - 4/15 6 min walk 448 m - 2/16 6 min walk 421 m - 8/16 6 min walk 354 m - 9/17 6 min walk 436 m - 1/19 6 min walk 366 m - 10/19 6 min walk 402 m - 8/20 6 min walk 137 m (but walked slowly and stopped early due to knee pain).  7.  Rheumatic fever 8.  LHC (9/10): no angiographic CAD.  9.  Warfarin anticoagulation for mechanical MV, goal INR 2.5-3.5 10.  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  11. 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 C difficile diarrhea 1/12 13. GERD 14. Holter (1/19): No significant arrhythmias.   Family History: Emphysema mother, smoker.  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   ROS: All systems reviewed and negative except as noted in HPI.   Current Outpatient Medications  Medication Sig Dispense Refill  . acetaminophen (TYLENOL) 325 MG tablet Take 650 mg by mouth. As directed as needed     . aspirin 81 MG EC tablet Take 81 mg by mouth daily.      Marland Kitchen azaTHIOprine (IMURAN) 50 MG tablet Take 100 mg by mouth daily.    . belimumab (BENLYSTA) 400 MG SOLR injection Inject 10 mg/kg into the vein every 30 (thirty) days.    . Calcium Citrate-Vitamin D (CALCIUM + D PO) Take by mouth.    . Cyanocobalamin (VITAMIN B 12 PO) Take 1,000 mg by mouth.    . furosemide (LASIX) 20 MG tablet TAKE 1 TABLET DAILY 90 tablet 3  . hydroxychloroquine (PLAQUENIL) 200 MG tablet Take 2 tablets daily    . KLOR-CON M20 20 MEQ tablet TAKE 1 TABLET TWICE A DAY 180 tablet 3  . magnesium oxide (MAG-OX) 400 MG tablet Take 400 mg by mouth daily.    . metoprolol succinate (TOPROL-XL) 25 MG 24 hr tablet TAKE 1/2 TABLET (= 12.5MG   TOTAL) DAILY 45 tablet 3  . Multiple Vitamin (MULTIVITAMIN) tablet Take 1 tablet by mouth daily.      . predniSONE (DELTASONE) 5 MG tablet Take 5 mg by mouth daily.      .  sildenafil (REVATIO) 20 MG tablet Take 1 tablet (20 mg total) by mouth 3 (three) times daily. 270 tablet 3  . Vitamin D, Cholecalciferol, 400 units CAPS Take by mouth.    . warfarin (COUMADIN) 5 MG tablet Take as directed by the anticoagulation clinic. 135 tablet 3   No current facility-administered medications for this encounter.     BP 114/68   Pulse 88   Wt 83.7 kg (184 lb 9.6 oz)   SpO2 100%   BMI 31.69 kg/m  General: NAD Neck: No JVD, no thyromegaly or thyroid nodule.  Lungs: Clear to auscultation bilaterally with normal respiratory effort. CV: Nondisplaced PMI.  Heart regular S1/S2, no S3/S4, mechanical S1, 1/6  SEM RUSB.  No peripheral edema.  No carotid bruit.  Normal pedal pulses.  Abdomen: Soft, nontender, no hepatosplenomegaly, no distention.  Skin: Intact without lesions or rashes.  Neurologic: Alert and oriented x 3.  Psych: Normal affect. Extremities: No clubbing or cyanosis.  HEENT: Normal.   Assessment/Plan:  1. Status post mechanical MVR:  Mitral valve replacement, stable from valve standpoint.  She is on coumadin goal INR 2.5 - 3.5 and ASA 81 mg daily.  - Check CBC today.   2.  Pulmonary HTN:  Pulmonary HTN is likely a mixed picture, from mitral valve disease as well as mixed connective tissue disease.  She had mild residual pulmonary HTN with moderately increased PVR even after mitral valve surgery on last RHC though this was improved compared to initial RHC.  The residual pulmonary HTN may be due to pulmonary vascular remodeling with chronic left atrial pressure elevation in the setting of mitral valve disease.  Revatio was restarted at 20 mg tid.  Most recent echo in 5/46 showed PA systolic pressure 33 mmHg.  RV looked normal.  6 minute walk considerably worse today but was significantly limited by knee pain.  - I will arrange for repeat echo.  - Continue current Lasix 20 mg daily and check BMET.  - Continue current Revatio. 3. Mixed connective tissue disorder:  Patient has history of pulmonary fibrosis. Followed by pulmonary and rheumatology at [facility].   Followup in 6 months with echo.   Loralie Champagne 05/23/2019

## 2019-05-24 ENCOUNTER — Other Ambulatory Visit: Payer: Self-pay

## 2019-05-24 ENCOUNTER — Ambulatory Visit (HOSPITAL_COMMUNITY): Payer: BC Managed Care – PPO | Attending: Cardiovascular Disease

## 2019-05-24 DIAGNOSIS — I5032 Chronic diastolic (congestive) heart failure: Secondary | ICD-10-CM | POA: Insufficient documentation

## 2019-05-25 ENCOUNTER — Telehealth (HOSPITAL_COMMUNITY): Payer: Self-pay

## 2019-05-25 NOTE — Telephone Encounter (Signed)
-----   Message from Larey Dresser, MD sent at 05/24/2019  9:34 PM EDT ----- EF 50-55%, normal RV, normal-appearing mechanical mitral valve.

## 2019-05-25 NOTE — Telephone Encounter (Signed)
Pt aware of results and appreciative.  

## 2019-05-30 ENCOUNTER — Other Ambulatory Visit: Payer: Self-pay | Admitting: Rheumatology

## 2019-05-30 DIAGNOSIS — M25461 Effusion, right knee: Secondary | ICD-10-CM

## 2019-07-03 ENCOUNTER — Ambulatory Visit
Admission: RE | Admit: 2019-07-03 | Discharge: 2019-07-03 | Disposition: A | Discharge status: Home / Self Care | Payer: BC Managed Care – PPO | Source: Ambulatory Visit | Attending: Rheumatology | Admitting: Rheumatology

## 2019-07-03 ENCOUNTER — Other Ambulatory Visit: Payer: Self-pay

## 2019-07-03 DIAGNOSIS — M25461 Effusion, right knee: Secondary | ICD-10-CM

## 2019-08-23 ENCOUNTER — Other Ambulatory Visit: Payer: Self-pay

## 2019-08-23 ENCOUNTER — Encounter (HOSPITAL_COMMUNITY): Payer: Self-pay | Admitting: Emergency Medicine

## 2019-08-23 ENCOUNTER — Emergency Department (HOSPITAL_COMMUNITY)
Admission: EM | Admit: 2019-08-23 | Discharge: 2019-08-23 | Disposition: A | Discharge status: Home / Self Care | Payer: BC Managed Care – PPO | Attending: Emergency Medicine | Admitting: Emergency Medicine

## 2019-08-23 DIAGNOSIS — Z79899 Other long term (current) drug therapy: Secondary | ICD-10-CM | POA: Diagnosis not present

## 2019-08-23 DIAGNOSIS — I110 Hypertensive heart disease with heart failure: Secondary | ICD-10-CM | POA: Diagnosis not present

## 2019-08-23 DIAGNOSIS — I509 Heart failure, unspecified: Secondary | ICD-10-CM | POA: Insufficient documentation

## 2019-08-23 DIAGNOSIS — G8918 Other acute postprocedural pain: Secondary | ICD-10-CM | POA: Diagnosis not present

## 2019-08-23 DIAGNOSIS — M25461 Effusion, right knee: Secondary | ICD-10-CM | POA: Diagnosis not present

## 2019-08-23 DIAGNOSIS — Z7982 Long term (current) use of aspirin: Secondary | ICD-10-CM | POA: Diagnosis not present

## 2019-08-23 DIAGNOSIS — I2510 Atherosclerotic heart disease of native coronary artery without angina pectoris: Secondary | ICD-10-CM | POA: Insufficient documentation

## 2019-08-23 MED ORDER — HYDROMORPHONE HCL 2 MG/ML IJ SOLN
2.0000 mg | Freq: Once | INTRAMUSCULAR | Status: AC
  Administered 2019-08-23: 2 mg via INTRAMUSCULAR
  Filled 2019-08-23: qty 1

## 2019-08-23 MED ORDER — ONDANSETRON 8 MG PO TBDP
8.0000 mg | ORAL_TABLET | Freq: Once | ORAL | Status: AC
  Administered 2019-08-23: 8 mg via ORAL
  Filled 2019-08-23: qty 1

## 2019-08-23 NOTE — ED Triage Notes (Signed)
Pt reports has severe right knee pains since Monday when had surgery for meniscus. Taking hydrocodone, tylenol, and elevating without relief. Pt called surgeon and was told to take her pain medications, so patient did and reports they haven't called her back since the second time she called.

## 2019-08-23 NOTE — Discharge Instructions (Addendum)
Continue to elevate the right leg above your heart as much as possible.  Use heat on the sore area 3-4 times a day.  Call your orthopedic doctor early tomorrow morning for an appointment to be seen in his clinic tomorrow afternoon.  He may need to draw some fluid from the right knee and inject it with steroids to treat a reactive arthritis.

## 2019-08-23 NOTE — ED Provider Notes (Signed)
[facility] DEPT Provider Note   CSN: [id number] Arrival date & time: 08/23/19  1527     History   Chief Complaint Chief Complaint  Patient presents with  . Post-op Problem  . Knee Pain    HPI Katie [name] is a 53 y.o. female.     HPI  She presents for evaluation of right knee pain with swelling, following meniscectomy, 4 days ago.  She denies fever, chills, nausea or vomiting.  She is currently being anticoagulated.  Her pain is worsened despite taking Norco every 4 hours.  She tried to contact her orthopedist but they did not return the call.  There are no other known modifying factors.   Past Medical History:  Diagnosis Date  . Abscess of right breast    sternal osteomyeliitis, pseudomonas  . C. difficile diarrhea 1/12  . CAD (coronary artery disease)    LHC (9/10): no angiographic CAD  . 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   . CHF (congestive heart failure) (HCC)    secondary to mmoderate mitral stenosis and moderate mitral regurgitation in the setting of rheumatic vlave disease as well as severe pulmonary HTN likely due to a combination of MV disease and pulmoanary arterial HTN in the setting of collagen vascular disease. Not valvuloplasty candidate due to MR. patient had MV replacement with mechanical MV in 8/11.  Marland Kitchen CHF (congestive heart failure) (HCC)    Echo post-op showed normally functioning mechanical MV, EF 60-65% with septal bounce, small mobile structure in the mid ventricle (? loose papillary muscle), RV-RA gradient 22 mmHg. Echo (10/11): EF 60-65%, normally functioning mechanical mitral vlavue.   . Mixed connective tissue disease ([place])    diagnosis 1990. 8/10 ANA positive, anti-dDNA positive, anti-SCL70 negative  . 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 Pulmonary HTN ([place])    porbably secondary to combination of rheumatic MV disease and pulmonary arterial HYN form  vollagen vascalr disease. CTA chest (7/10) with no evidence for pulmonary emboli or chronic pulmonary emboli. PA pressure 72/28 mean 50 with PVR 5.7 WU on initial RHC, PA pressure 45/21 mean 30 PVR 2.3 WU on f/u RHC on Revatio 80 mg three times a day (1/11).  After MV surgery, Revatio stopped.  . Pulmonary HTN ([place])    increased  dyspnea. Repeat RHC with mean RA 6, PA 42/20 mean 29, PVR 4.8 WU,  2.3. Revatio 20 mg  3x a day restarted. 3/11 6 min walk 466m. 2/12 6 min walk 427 m.   . Raynaud's syndrome   . Restrictive lung disease    secondary to MCTD. PFTs 03/06/99 VC 47% DLCO 26%. PFTs 09/12/08 VC 43% DLCO 45%. PFTs 6/10 FVC 37%, FEV1 34%, ratio 69%, TLC 50%. using home O2 periodically  . Rheumatic fever   . Warfarin anticoagulation    for mechanical MV, goal INR 2.5-3.5    Patient Active Problem List   Diagnosis Date Noted  . Insomnia w/ sleep apnea 04/22/2016  . Hypoxemia 04/22/2016  . Pulmonary hypertension due to mitral valve disease ([place]) 04/22/2016  . Lupus anticoagulant disorder ([place]) 04/22/2016  . H/O mitral valve replacement with mechanical valve 04/22/2016  . Paroxysmal spells 04/22/2016  . Pericardial effusion with cardiac tamponade 04/22/2016  . Chronic systolic congestive heart failure ([place]) 04/22/2016  . Mixed connective tissue disease ([place])   . Nephrotic syndrome   . CHF (congestive heart failure) ([place])   . Pulmonary HTN ([place])   .  Rheumatic fever   . CAD (coronary artery disease)   . Warfarin anticoagulation   . Cardiac tamponade   . Abscess of right breast   . C. difficile diarrhea   . Upper airway cough syndrome 11/04/2011  . Long term (current) use of anticoagulants 01/04/2011  . PSEUDOMONAS INFECTION 09/29/2010  . MASTITIS 09/29/2010  . ACUTE OSTEOMYELITIS OTHER SPECIFIED SITE 09/29/2010  . CHEST PAIN-UNSPECIFIED 08/03/2010  . MITRAL STENOSIS 10/27/2009  . PRIMARY PULMONARY HYPERTENSION 09/09/2009  . MITRAL STENOSIS WITH INSUFFICIENCY 08/21/2009  . Mitral  valve disorder 06/20/2009  . CHRONIC DIASTOLIC HEART FAILURE 123456  . RESPIRATORY FAILURE, CHRONIC 04/25/2009  . NEPHROTIC SYND W/OTH PATHAL LES DZ CLASS ELSW 04/25/2009  . PULMONARY HYPERTENSION, SECONDARY 04/11/2009  . RAYNAUDS SYNDROME 08/01/2008  . PULMONARY FIBROSIS ILD POST INFLAMMATORY CHRONIC 08/01/2008  . SLE 08/01/2008    Past Surgical History:  Procedure Laterality Date  . BREAST CYST ASPIRATION  03/09/2012  . CESAREAN SECTION    . COLONOSCOPY    . s/p mitral valvue replacement  05/13/2010   by Dr. Roxy Manns by minimally invasive window.   . TUBAL LIGATION       OB History    Gravida  3   Para  0   Term      Preterm  0   AB  1   Living  1     SAB  1   TAB      Ectopic      Multiple      Live Births               Home Medications    Prior to Admission medications   Medication Sig Start Date End Date Taking? Authorizing Provider  acetaminophen (TYLENOL) 325 MG tablet Take 650 mg by mouth. As directed as needed    Yes [provider]  aspirin 81 MG EC tablet Take 81 mg by mouth daily.     Yes [provider]  azaTHIOprine (IMURAN) 50 MG tablet Take 50 mg by mouth daily.    Yes [provider]  belimumab (BENLYSTA) 400 MG SOLR injection Inject 10 mg/kg into the vein every 30 (thirty) days.   Yes [provider]  Calcium Citrate-Vitamin D (CALCIUM + D PO) Take by mouth.   Yes [provider]  Cyanocobalamin (VITAMIN B 12 PO) Take 1,000 mg by mouth.   Yes [provider]  enoxaparin (LOVENOX) 80 MG/0.8ML injection Inject 80 mg into the skin every 12 (twelve) hours. 08/03/19  Yes [provider]  furosemide (LASIX) 20 MG tablet TAKE 1 TABLET DAILY Patient taking differently: Take 20 mg by mouth daily.  05/07/19  Yes Larey Dresser, MD  HYDROcodone-acetaminophen (NORCO/VICODIN) 5-325 MG tablet Take 1 tablet by mouth every 6 (six) hours as needed for mild pain. 08/20/19  Yes [provider]  hydroxychloroquine (PLAQUENIL) 200 MG tablet Take 2 tablets daily   Yes [provider]  KLOR-CON M20 20 MEQ tablet TAKE 1 TABLET TWICE A DAY Patient taking differently: Take 20 mEq by mouth 2 (two) times daily.  05/07/19  Yes Larey Dresser, MD  magnesium oxide (MAG-OX) 400 MG tablet Take 400 mg by mouth daily.   Yes [provider]  metoprolol succinate (TOPROL-XL) 25 MG 24 hr tablet TAKE 1/2 TABLET (= 12.5MG   TOTAL) DAILY Patient taking differently: Take 12.5 mg by mouth daily.  02/12/19  Yes Larey Dresser, MD  Multiple Vitamin (MULTIVITAMIN) tablet Take 1 tablet  by mouth daily.     Yes [provider]  predniSONE (DELTASONE) 5 MG tablet Take 5 mg by mouth daily.     Yes [provider]  sildenafil (REVATIO) 20 MG tablet Take 1 tablet (20 mg total) by mouth 3 (three) times daily. 11/09/18  Yes Larey Dresser, MD  valACYclovir (VALTREX) 1000 MG tablet Take 1,000 mg by mouth daily. 08/14/19  Yes [provider]  Vitamin D, Cholecalciferol, 400 units CAPS Take by mouth.   Yes [provider]  warfarin (COUMADIN) 5 MG tablet Take as directed by the anticoagulation clinic. Patient taking differently: Take 5-7.5 mg by mouth See admin instructions. 7.5mg  on Sun,Mon,Wed, Fri, Sat 5mg  on Tues,Thursday 03/23/11  Yes Larey Dresser, MD    Family History Family History  Problem Relation Age of Onset  . Emphysema Mother        smoker  . COPD Mother   . Heart disease Maternal Grandmother        side of family not given  . Diabetes Maternal Grandmother   . Hypertension Father   . Osteoarthritis Father   . Sleep apnea Father   . Breast cancer Maternal Aunt     Social History Social History   Tobacco Use  . Smoking status: Never Smoker  . Smokeless tobacco: Never Used  Substance Use Topics  . Alcohol use: No  . Drug use: No     Allergies   Cephalexin and Methotrexate   Review of Systems Review of Systems  All  other systems reviewed and are negative.    Physical Exam Updated Vital Signs BP 110/75   Pulse 90   Temp 98.7 F (37.1 C) (Oral)   Resp 18   LMP 08/06/2019   SpO2 93%   Physical Exam Vitals signs and nursing note reviewed.  Constitutional:      General: She is in acute distress.     Appearance: She is well-developed. She is not ill-appearing, toxic-appearing or diaphoretic.  HENT:     Head: Normocephalic and atraumatic.     Right Ear: External ear normal.     Left Ear: External ear normal.  Eyes:     Conjunctiva/sclera: Conjunctivae normal.     Pupils: Pupils are equal, round, and reactive to light.  Neck:     Musculoskeletal: Normal range of motion and neck supple.     Trachea: Phonation normal.  Cardiovascular:     Rate and Rhythm: Normal rate.  Pulmonary:     Effort: Pulmonary effort is normal.  Musculoskeletal:     Comments: Right knee tender and swollen with large effusion.  Right knee is diffusely tender.  There is mild swelling tenderness of right thigh.  No significant right lower leg swelling.  Neurovascular intact distally in both feet.  Skin:    General: Skin is warm and dry.  Neurological:     Mental Status: She is alert and oriented to person, place, and time.     Cranial Nerves: No cranial nerve deficit.     Sensory: No sensory deficit.     Motor: No abnormal muscle tone.     Coordination: Coordination normal.  Psychiatric:        Mood and Affect: Mood normal.        Behavior: Behavior normal.        Thought Content: Thought content normal.        Judgment: Judgment normal.      ED Treatments / Results  Labs (all  labs ordered are listed, but only abnormal results are displayed) Labs Reviewed - No data to display  EKG None  Radiology No results found.  Procedures Procedures (including critical care time)  Medications Ordered in ED Medications  HYDROmorphone (DILAUDID) injection 2 mg (2 mg Intramuscular Given 08/23/19 1904)   ondansetron (ZOFRAN-ODT) disintegrating tablet 8 mg (8 mg Oral Given 08/23/19 1906)     Initial Impression / Assessment and Plan / ED Course  I have reviewed the triage vital signs and the nursing notes.  Pertinent labs & imaging results that were available during my care of the patient were reviewed by me and considered in my medical decision making (see chart for details).  Clinical Course as of Aug 22 2112  Thu Aug 23, 2019  2028 I discussed case with on-call orthopedist, Dr. Doran Durand.  We discussed the case findings.  He feels that these findings are reassuring, and that further intervention at this time is not necessary.  He recommends following up with her primary orthopedist, tomorrow in the office during the afternoon clinic.   [EW]    Clinical Course User Index [EW] Daleen Bo, MD           Patient Vitals for the past 24 hrs:  BP Temp Temp src Pulse Resp SpO2  08/23/19 2000 110/75 - - 90 18 93 %  08/23/19 1928 106/71 - - 87 18 96 %  08/23/19 1544 - 98.7 F (37.1 C) Oral 78 - -    8:28 PM Reevaluation with update and discussion. After initial assessment and treatment, an updated evaluation reveals she is more comfortable now and states her pain is better.  Findings discussed with the patient, and the female present with her, and all questions answered. Daleen Bo   Medical Decision Making: Knee swelling postoperative.  Knee wounds did not indicate infection.  Doubt DVT.  Possible reactive arthritis postmastectomy.  She is improved after IM Dilaudid.  CRITICAL CARE-no Performed by: Daleen Bo  Nursing Notes Reviewed/ Care Coordinated Applicable Imaging Reviewed Interpretation of Laboratory Data incorporated into ED treatment  The patient appears reasonably screened and/or stabilized for discharge and I doubt any other medical condition or other [facility] requiring further screening, evaluation, or treatment in the ED at this time prior to discharge.  Plan: Home  Medications-continue usual, use Norco to every 4 hours as needed; Home Treatments-rest, elevation, heat; return here if the recommended treatment, does not improve the symptoms; Recommended follow up-orthopedic follow-up tomorrow, call morning for appointment.    Final Clinical Impressions(s) / ED Diagnoses   Final diagnoses:  Post-op pain  Effusion of right knee    ED Discharge Orders    None       Daleen Bo, MD 08/23/19 2114

## 2019-10-24 ENCOUNTER — Other Ambulatory Visit (HOSPITAL_COMMUNITY): Payer: Self-pay

## 2019-10-24 MED ORDER — SILDENAFIL CITRATE 20 MG PO TABS: 20.0000 mg | ORAL_TABLET | Freq: Three times a day (TID) | ORAL | 11 refills | Status: DC

## 2020-01-01 ENCOUNTER — Other Ambulatory Visit (HOSPITAL_COMMUNITY): Payer: Self-pay | Admitting: Cardiology

## 2020-02-20 ENCOUNTER — Institutional Professional Consult (permissible substitution): Payer: BC Managed Care – PPO | Admitting: Internal Medicine

## 2020-02-28 LAB — PULMONARY FUNCTION TEST

## 2020-03-26 NOTE — Progress Notes (Signed)
[facility] Note  03/28/2020     CHIEF COMPLAINT Patient presents for Retina Evaluation   HISTORY OF PRESENT ILLNESS: Katie [name] is a 54 y.o. female who presents to the clinic today for:   HPI    Retina Evaluation    In both eyes.  This started 1 year ago.  Duration of 1 year.  Associated Symptoms Floaters.  Negative for Flashes, Distortion, Pain, Photophobia, Trauma, Jaw Claudication, Fever, Fatigue, Blind Spot, Redness, Glare, Scalp Tenderness, Shoulder/Hip pain and Weight Loss.  Context:  reading and night driving.  Treatments tried include no treatments.  I, the attending physician,  performed the HPI with the patient and updated documentation appropriately.          Comments    Patient states has been on plaquenil for the past 5 years or more (200mg  twice daily, or 400mg  total). Takes plaquenil for lupus. Patient states that for the past year, she is having trouble with glare, bright lights, and driving at night. The patient also reports trouble with reading up close. However, reading glasses help.        Last edited by Bernarda Caffey, MD on 03/28/2020 12:55 PM. (History)    pt is here on the referral of Dr. Kathlen Mody for concern of Plaquenil toxicity, pt is taking Plaquenil for lupus (200mg  BID) for at least 5 years, pt had VF vision loss at Dr. Kathleen Argue office, pt sees Dr. Kathlene November for lupus, pts states Dr. Kathlene November discontinued Plaquenil about 2 weeks ago, pt states she also takes oral Prednisone (5mg  daily) for over 30 years and Benlysta (10mg ) once a month, pt feels like the vision in her right eye is worse than her left, she states she has problems with reading  Referring physician: Hortencia Pilar, MD [place],  [place] [address]  HISTORICAL INFORMATION:   Selected notes from the [facility] Referred by Dr. Quentin Ore for concern of plaquenil toxicity LEE:  Ocular Hx- PMH-   CURRENT MEDICATIONS: No current  outpatient medications on file. (Ophthalmic Drugs)   No current facility-administered medications for this visit. (Ophthalmic Drugs)   Current Outpatient Medications (Other)  Medication Sig  . acetaminophen (TYLENOL) 325 MG tablet Take 650 mg by mouth. As directed as needed   . aspirin 81 MG EC tablet Take 81 mg by mouth daily.    Marland Kitchen azaTHIOprine (IMURAN) 50 MG tablet Take 50 mg by mouth daily.   . belimumab (BENLYSTA) 400 MG SOLR injection Inject 10 mg/kg into the vein every 30 (thirty) days.  . Calcium Citrate-Vitamin D (CALCIUM + D PO) Take by mouth.  . Cyanocobalamin (VITAMIN B 12 PO) Take 1,000 mg by mouth.  . enoxaparin (LOVENOX) 80 MG/0.8ML injection Inject 80 mg into the skin every 12 (twelve) hours.  . furosemide (LASIX) 20 MG tablet TAKE 1 TABLET DAILY (Patient taking differently: Take 20 mg by mouth daily. )  . HYDROcodone-acetaminophen (NORCO/VICODIN) 5-325 MG tablet Take 1 tablet by mouth every 6 (six) hours as needed for mild pain.  . hydroxychloroquine (PLAQUENIL) 200 MG tablet Take 2 tablets daily  . KLOR-CON M20 20 MEQ tablet TAKE 1 TABLET TWICE A DAY (Patient taking differently: Take 20 mEq by mouth 2 (two) times daily. )  . levothyroxine (SYNTHROID) 25 MCG tablet Take 25 mcg by mouth daily before breakfast.  . magnesium oxide (MAG-OX) 400 MG tablet Take 400 mg by mouth daily.  . metoprolol succinate (TOPROL-XL) 25  MG 24 hr tablet TAKE 1/2 TABLET (= 12.5MG   TOTAL) DAILY  . Multiple Vitamin (MULTIVITAMIN) tablet Take 1 tablet by mouth daily.    . predniSONE (DELTASONE) 5 MG tablet Take 5 mg by mouth daily.    . sildenafil (REVATIO) 20 MG tablet Take 1 tablet (20 mg total) by mouth 3 (three) times daily.  . valACYclovir (VALTREX) 1000 MG tablet Take 1,000 mg by mouth daily.  . Vitamin D, Cholecalciferol, 400 units CAPS Take by mouth.  . warfarin (COUMADIN) 5 MG tablet Take as directed by the anticoagulation clinic. (Patient taking differently: Take 5-7.5 mg by mouth See admin  instructions. 7.5mg  on Sun,Mon,Wed, Fri, Sat 5mg  on Tues,Thursday)   No current facility-administered medications for this visit. (Other)      REVIEW OF SYSTEMS: ROS    Positive for: Musculoskeletal, Endocrine, Cardiovascular, Eyes, Respiratory   Negative for: Constitutional, Gastrointestinal, Neurological, Skin, Genitourinary, HENT, Psychiatric, Allergic/Imm, Heme/Lymph   Last edited by Roselee Nova D, COT on 03/28/2020  8:22 AM. (History)       ALLERGIES Allergies  Allergen Reactions  . Cephalexin Other (See Comments)    HIVES  . Methotrexate     REACTION: intolerance    PAST MEDICAL HISTORY Past Medical History:  Diagnosis Date  . Abscess of right breast    sternal osteomyeliitis, pseudomonas  . C. difficile diarrhea 1/12  . CAD (coronary artery disease)    LHC (9/10): no angiographic CAD  . 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   . CHF (congestive heart failure) (HCC)    secondary to mmoderate mitral stenosis and moderate mitral regurgitation in the setting of rheumatic vlave disease as well as severe pulmonary HTN likely due to a combination of MV disease and pulmoanary arterial HTN in the setting of collagen vascular disease. Not valvuloplasty candidate due to MR. patient had MV replacement with mechanical MV in 8/11.  Marland Kitchen CHF (congestive heart failure) (HCC)    Echo post-op showed normally functioning mechanical MV, EF 60-65% with septal bounce, small mobile structure in the mid ventricle (? loose papillary muscle), RV-RA gradient 22 mmHg. Echo (10/11): EF 60-65%, normally functioning mechanical mitral vlavue.   . Mixed connective tissue disease ([place])    diagnosis 1990. 8/10 ANA positive, anti-dDNA positive, anti-SCL70 negative  . 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 Pulmonary HTN ([place])    porbably secondary to combination of rheumatic MV disease and pulmonary arterial HYN form vollagen vascalr disease. CTA  chest (7/10) with no evidence for pulmonary emboli or chronic pulmonary emboli. PA pressure 72/28 mean 50 with PVR 5.7 WU on initial RHC, PA pressure 45/21 mean 30 PVR 2.3 WU on f/u RHC on Revatio 80 mg three times a day (1/11).  After MV surgery, Revatio stopped.  . Pulmonary HTN ([place])    increased  dyspnea. Repeat RHC with mean RA 6, PA 42/20 mean 29, PVR 4.8 WU,  2.3. Revatio 20 mg  3x a day restarted. 3/11 6 min walk 468m. 2/12 6 min walk 427 m.   . Raynaud's syndrome   . Restrictive lung disease    secondary to MCTD. PFTs 03/06/99 VC 47% DLCO 26%. PFTs 09/12/08 VC 43% DLCO 45%. PFTs 6/10 FVC 37%, FEV1 34%, ratio 69%, TLC 50%. using home O2 periodically  . Rheumatic fever   . Warfarin anticoagulation    for mechanical MV, goal INR 2.5-3.5   Past Surgical History:  Procedure Laterality Date  .  BREAST CYST ASPIRATION  03/09/2012  . CESAREAN SECTION    . COLONOSCOPY    . s/p mitral valvue replacement  05/13/2010   by Dr. Roxy Manns by minimally invasive window.   . TUBAL LIGATION      FAMILY HISTORY Family History  Problem Relation Age of Onset  . Emphysema Mother        smoker  . COPD Mother   . Heart disease Maternal Grandmother        side of family not given  . Diabetes Maternal Grandmother   . Hypertension Father   . Osteoarthritis Father   . Sleep apnea Father   . Breast cancer Maternal Aunt     SOCIAL HISTORY Social History   Tobacco Use  . Smoking status: Never Smoker  . Smokeless tobacco: Never Used  Substance Use Topics  . Alcohol use: No  . Drug use: No         OPHTHALMIC EXAM:  Base Eye Exam    Visual Acuity (Snellen - Linear)      Right Left   Dist [place] 20/50 +2 20/40 -2   Dist ph [place] 20/40 +2 20/30 -2       Tonometry (Tonopen, 8:31 AM)      Right Left   Pressure 15 18       Pupils      Dark Light Shape React APD   Right 3 2 Round Brisk None   Left 3 2 Round Brisk None       Visual Fields (Counting fingers)      Left Right    Full Full        Extraocular Movement      Right Left    Full, Ortho Full, Ortho  No movement on cover testing. ? Positive angle kappa       Dilation    Both eyes: 1.0% Mydriacyl, 2.5% Phenylephrine @ 8:31 AM        Slit Lamp and Fundus Exam    Slit Lamp Exam      Right Left   Lids/Lashes Dermatochalasis - upper lid, mild Meibomian gland dysfunction Dermatochalasis - upper lid, mild Meibomian gland dysfunction   Conjunctiva/Sclera White and quiet White and quiet   Cornea Trace Punctate epithelial erosions Trace Punctate epithelial erosions   Anterior Chamber Deep and clear, narrow temporal angle, No cell or flare Deep and clear, narrow temporal angle, No cell or flare   Iris Round and dilated Round and dilated   Lens 2+ Nuclear sclerosis, 2+ Cortical cataract, 1-2+ Posterior subcapsular cataract 2+ Nuclear sclerosis, 2+ Cortical cataract, 1+ Posterior subcapsular cataract   Vitreous Vitreous syneresis Vitreous syneresis       Fundus Exam      Right Left   Disc Pink and Sharp, +cupping Pink and Sharp, +cupping, +PPP   C/D Ratio 0.7 0.75   Macula Flat, Good foveal reflex, RPE mottling and clumping, No heme or edema Flat, Good foveal reflex, RPE mottling and clumping, No heme or edema   Vessels Vascular attenuation, AV crossing changes Vascular attenuation, AV crossing changes   Periphery Attached, No heme  Attached, no heme           Refraction    Manifest Refraction      Sphere Cylinder Axis Dist VA   Right +1.75 +0.75 005 20/20-1   Left +1.00 +1.25 175 20/20-1          IMAGING AND PROCEDURES  Imaging and Procedures for @TODAY @  OCT, Retina -  OU - Both Eyes       Right Eye Quality was good. Central Foveal Thickness: 238. Progression has no prior data. Findings include abnormal foveal contour, no IRF, no SRF, vitreomacular adhesion  (?early ellipsoid signal loss).   Left Eye Quality was good. Central Foveal Thickness: 238. Progression has no prior data. Findings include abnormal  foveal contour, no IRF, no SRF, vitreomacular adhesion  (parafoval decrease in ellipsoid).   Notes *Images captured and stored on drive  Diagnosis / Impression:  Mild parafoveal ellipsoid signal loss -- early Plaquenil toxicity   Clinical management:  See below  Abbreviations: NFP - Normal foveal profile. CME - cystoid macular edema. PED - pigment epithelial detachment. IRF - intraretinal fluid. SRF - subretinal fluid. EZ - ellipsoid zone. ERM - epiretinal membrane. ORA - outer retinal atrophy. ORT - outer retinal tubulation. SRHM - subretinal hyper-reflective material                 ASSESSMENT/PLAN:    ICD-10-CM   1. Long-term use of Plaquenil  Z79.899   2. Retinal edema  H35.81 OCT, Retina - OU - Both Eyes  3. Essential hypertension  I10   4. Hypertensive retinopathy of both eyes  H35.033   5. Anatomical narrow angle glaucoma  H40.039   6. Combined forms of age-related cataract of both eyes  H25.813     1. Plaquenil Toxicity OU  - pt reports history of taking Plaquenil for lupus (200mg  BID) for ~20 years  - 400 mg/day --> 4.77 mg/kg/day  - pt stopped taking plaquenil ~2 wks ago (beginning of June 2021)  - pt had VF loss at Dr. Kathleen Argue office consistent with toxic maculopathy  - OCT today shows early parafoveal ellipsoid thinning OU -- no frank EZ loss  - discussed findings, prognosis and possibility of progression despite discontinuation of medication  - recommend monitoring  - f/u 3-4 months, DFE, OCT  2. No retinal edema on exam or OCT  3,4. Hypertensive retinopathy OU  - discussed importance of tight BP control  - monitor  5. Narrow angle glaucoma OU  - IOP today: 15,18  - under the expert management of Dr. Kathlen Mody  6. Mixed cataracts OU  - The symptoms of cataract, surgical options, and treatments and risks were discussed with patient.   - under the expert management of Dr. Kathlen Mody   Ophthalmic Meds Ordered this visit:  No orders of the defined types  were placed in this encounter.      Return for f/u 3-4 months, plaquenil toxicity, DFE, OCT.  There are no Patient Instructions on file for this visit.   Explained the diagnoses, plan, and follow up with the patient and they expressed understanding.  Patient expressed understanding of the importance of proper follow up care.   This document serves as a record of services personally performed by Gardiner Sleeper, MD, PhD. It was created on their behalf by Ernest Mallick, OA, an ophthalmic assistant. The creation of this record is the provider's dictation and/or activities during the visit.    Electronically signed by: Ernest Mallick, OA 06.16.2021 1:08 PM   Gardiner Sleeper, M.D., Ph.D. Diseases & Surgery of the Retina and Vitreous [facility]  I have reviewed the above documentation for accuracy and completeness, and I agree with the above. Gardiner Sleeper, M.D., Ph.D. 03/28/20 1:08 PM   Abbreviations: M myopia (nearsighted); A astigmatism; H hyperopia (farsighted); P presbyopia; Mrx spectacle prescription;  CTL contact  lenses; OD right eye; OS left eye; OU both eyes  XT exotropia; ET esotropia; PEK punctate epithelial keratitis; PEE punctate epithelial erosions; DES dry eye syndrome; MGD meibomian gland dysfunction; ATs artificial tears; PFAT's preservative free artificial tears; [place] nuclear sclerotic cataract; PSC posterior subcapsular cataract; ERM epi-retinal membrane; PVD posterior vitreous detachment; RD retinal detachment; DM diabetes mellitus; DR diabetic retinopathy; NPDR non-proliferative diabetic retinopathy; PDR proliferative diabetic retinopathy; CSME clinically significant macular edema; DME diabetic macular edema; dbh dot blot hemorrhages; CWS cotton wool spot; POAG primary open angle glaucoma; C/D cup-to-disc ratio; HVF humphrey visual field; GVF goldmann visual field; OCT optical coherence tomography; IOP intraocular pressure; BRVO Branch retinal vein  occlusion; CRVO central retinal vein occlusion; CRAO central retinal artery occlusion; BRAO branch retinal artery occlusion; RT retinal tear; SB scleral buckle; PPV pars plana vitrectomy; VH Vitreous hemorrhage; PRP panretinal laser photocoagulation; IVK intravitreal kenalog; VMT vitreomacular traction; MH Macular hole;  NVD neovascularization of the disc; NVE neovascularization elsewhere; AREDS age related eye disease study; ARMD age related macular degeneration; POAG primary open angle glaucoma; EBMD epithelial/anterior basement membrane dystrophy; ACIOL anterior chamber intraocular lens; IOL intraocular lens; PCIOL posterior chamber intraocular lens; Phaco/IOL phacoemulsification with intraocular lens placement; [place] photorefractive keratectomy; LASIK laser assisted in situ keratomileusis; HTN hypertension; DM diabetes mellitus; COPD chronic obstructive pulmonary disease

## 2020-03-28 ENCOUNTER — Encounter (INDEPENDENT_AMBULATORY_CARE_PROVIDER_SITE_OTHER): Payer: Self-pay | Admitting: Ophthalmology

## 2020-03-28 ENCOUNTER — Other Ambulatory Visit: Payer: Self-pay

## 2020-03-28 ENCOUNTER — Ambulatory Visit (INDEPENDENT_AMBULATORY_CARE_PROVIDER_SITE_OTHER): Payer: BC Managed Care – PPO | Admitting: Ophthalmology

## 2020-03-28 DIAGNOSIS — Z79899 Other long term (current) drug therapy: Secondary | ICD-10-CM

## 2020-03-28 DIAGNOSIS — H3581 Retinal edema: Secondary | ICD-10-CM

## 2020-03-28 DIAGNOSIS — H40039 Anatomical narrow angle, unspecified eye: Secondary | ICD-10-CM

## 2020-03-28 DIAGNOSIS — H25813 Combined forms of age-related cataract, bilateral: Secondary | ICD-10-CM

## 2020-03-28 DIAGNOSIS — I10 Essential (primary) hypertension: Secondary | ICD-10-CM | POA: Diagnosis not present

## 2020-03-28 DIAGNOSIS — H35033 Hypertensive retinopathy, bilateral: Secondary | ICD-10-CM

## 2020-04-08 ENCOUNTER — Other Ambulatory Visit: Payer: Self-pay | Admitting: Internal Medicine

## 2020-04-08 DIAGNOSIS — Z1231 Encounter for screening mammogram for malignant neoplasm of breast: Secondary | ICD-10-CM

## 2020-04-16 ENCOUNTER — Ambulatory Visit: Payer: BC Managed Care – PPO

## 2020-04-17 ENCOUNTER — Ambulatory Visit
Admission: RE | Admit: 2020-04-17 | Discharge: 2020-04-17 | Disposition: A | Discharge status: Home / Self Care | Payer: BC Managed Care – PPO | Source: Ambulatory Visit | Attending: Internal Medicine | Admitting: Internal Medicine

## 2020-04-17 ENCOUNTER — Other Ambulatory Visit: Payer: Self-pay

## 2020-04-17 DIAGNOSIS — Z1231 Encounter for screening mammogram for malignant neoplasm of breast: Secondary | ICD-10-CM

## 2020-04-22 ENCOUNTER — Other Ambulatory Visit (HOSPITAL_COMMUNITY): Payer: Self-pay | Admitting: *Deleted

## 2020-04-22 MED ORDER — POTASSIUM CHLORIDE CRYS ER 20 MEQ PO TBCR: 20.0000 meq | EXTENDED_RELEASE_TABLET | Freq: Two times a day (BID) | ORAL | 3 refills | Status: DC

## 2020-06-26 NOTE — Progress Notes (Signed)
[facility] Note  06/30/2020     CHIEF COMPLAINT Patient presents for Retina Follow Up   HISTORY OF PRESENT ILLNESS: Katie [name] is a 54 y.o. female who presents to the clinic today for:   HPI    Retina Follow Up    Patient presents with  Other.  Since onset it is stable.  I, the attending physician,  performed the HPI with the patient and updated documentation appropriately.          Comments    3 month follow up Plaquenil Toxicity OU- Vision stable since last visit OU.  Car lights are bothering her when driving and difficulties seeing the lines on the road.  Patient stopped Plaquenil 1 week before last visit.         Last edited by Bernarda Caffey, MD on 06/30/2020  1:15 PM. (History)    pt reports generalized blurry vision, but no distortion  Referring physician: Hortencia Pilar, MD [place],  [place] [id number]  HISTORICAL INFORMATION:   Selected notes from the MEDICAL RECORD NUMBER Referred by Dr. Quentin Ore for concern of plaquenil toxicity LEE:  Ocular Hx- PMH-   CURRENT MEDICATIONS: No current outpatient medications on file. (Ophthalmic Drugs)   No current facility-administered medications for this visit. (Ophthalmic Drugs)   Current Outpatient Medications (Other)  Medication Sig   acetaminophen (TYLENOL) 325 MG tablet Take 650 mg by mouth. As directed as needed    aspirin 81 MG EC tablet Take 81 mg by mouth daily.     azaTHIOprine (IMURAN) 50 MG tablet Take 50 mg by mouth daily.    belimumab (BENLYSTA) 400 MG SOLR injection Inject 10 mg/kg into the vein every 30 (thirty) days.   Calcium Citrate-Vitamin D (CALCIUM + D PO) Take by mouth.   Cyanocobalamin (VITAMIN B 12 PO) Take 1,000 mg by mouth.   enoxaparin (LOVENOX) 80 MG/0.8ML injection Inject 80 mg into the skin every 12 (twelve) hours.   furosemide (LASIX) 20 MG tablet TAKE 1 TABLET DAILY (Patient taking differently: Take 20 mg by mouth  daily. )   HYDROcodone-acetaminophen (NORCO/VICODIN) 5-325 MG tablet Take 1 tablet by mouth every 6 (six) hours as needed for mild pain.   levothyroxine (SYNTHROID) 25 MCG tablet Take 25 mcg by mouth daily before breakfast.   magnesium oxide (MAG-OX) 400 MG tablet Take 400 mg by mouth daily.   metoprolol succinate (TOPROL-XL) 25 MG 24 hr tablet TAKE 1/2 TABLET (= 12.5MG   TOTAL) DAILY   Multiple Vitamin (MULTIVITAMIN) tablet Take 1 tablet by mouth daily.     potassium chloride SA (KLOR-CON M20) 20 MEQ tablet Take 1 tablet (20 mEq total) by mouth 2 (two) times daily.   predniSONE (DELTASONE) 5 MG tablet Take 5 mg by mouth daily.     sildenafil (REVATIO) 20 MG tablet Take 1 tablet (20 mg total) by mouth 3 (three) times daily.   valACYclovir (VALTREX) 1000 MG tablet Take 1,000 mg by mouth daily.   Vitamin D, Cholecalciferol, 400 units CAPS Take by mouth.   warfarin (COUMADIN) 5 MG tablet Take as directed by the anticoagulation clinic. (Patient taking differently: Take 5-7.5 mg by mouth See admin instructions. 7.5mg  on Sun,Mon,Wed, Fri, Sat 5mg  on Tues,Thursday)   hydroxychloroquine (PLAQUENIL) 200 MG tablet Take 2 tablets daily (Patient not taking: Reported on 06/30/2020)   No current facility-administered medications for this visit. (Other)      REVIEW OF SYSTEMS: ROS  Positive for: Musculoskeletal, Endocrine, Cardiovascular, Eyes, Respiratory   Negative for: Constitutional, Gastrointestinal, Neurological, Skin, Genitourinary, HENT, Psychiatric, Allergic/Imm, Heme/Lymph   Last edited by Leonie Douglas, COA on 06/30/2020 10:08 AM. (History)       ALLERGIES Allergies  Allergen Reactions   Cephalexin Other (See Comments)    HIVES   Methotrexate     REACTION: intolerance    PAST MEDICAL HISTORY Past Medical History:  Diagnosis Date   Abscess of right breast    sternal osteomyeliitis, pseudomonas   C. 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    CHF (congestive heart failure) (HCC)    secondary to mmoderate mitral stenosis and moderate mitral regurgitation in the setting of rheumatic vlave disease as well as severe pulmonary HTN likely due to a combination of MV disease and pulmoanary arterial HTN in the setting of collagen vascular disease. Not valvuloplasty candidate due to MR. patient had MV replacement with mechanical MV in 8/11.   CHF (congestive heart failure) (HCC)    Echo post-op showed normally functioning mechanical MV, EF 60-65% with septal bounce, small mobile structure in the mid ventricle (? loose papillary muscle), RV-RA gradient 22 mmHg. Echo (10/11): EF 60-65%, normally functioning mechanical mitral vlavue.    Mixed connective tissue disease ([place])    diagnosis 1990. 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 CTA chest (7/10) with no evidence for pulmonary emboli or chronic pulmonary emboli. PA pressure 72/28 mean 50 with PVR 5.7 WU on initial RHC, PA pressure 45/21 mean 30 PVR 2.3 WU on f/u RHC on Revatio 80 mg three times a day (1/11).  After MV surgery, Revatio stopped.   Pulmonary HTN (HCC)    increased  dyspnea. Repeat RHC with mean RA 6, PA 42/20 mean 29, PVR 4.8 WU,  2.3. Revatio 20 mg  3x a day restarted. 3/11 6 min walk 453m. 2/12 6 min walk 427 m.    Raynaud's syndrome    Restrictive lung disease    secondary to MCTD. PFTs 03/06/99 VC 47% DLCO 26%. PFTs 09/12/08 VC 43% DLCO 45%. PFTs 6/10 FVC 37%, FEV1 34%, ratio 69%, TLC 50%. using home O2 periodically   Rheumatic fever    Warfarin anticoagulation    for mechanical MV, goal INR 2.5-3.5   Past Surgical History:  Procedure  Laterality Date   BREAST CYST ASPIRATION  03/09/2012   CESAREAN SECTION     COLONOSCOPY     s/p mitral valvue replacement  05/13/2010   by Dr. Roxy Manns by minimally invasive window.    TUBAL LIGATION      FAMILY HISTORY Family History  Problem Relation Age of Onset   Emphysema Mother        smoker   COPD Mother    Heart disease Maternal Grandmother        side of family not given   Diabetes Maternal Grandmother    Hypertension Father    Osteoarthritis Father    Sleep apnea Father    Breast cancer Maternal Aunt     SOCIAL HISTORY Social History   Tobacco Use   Smoking status: Never Smoker   Smokeless tobacco: Never Used  Substance Use Topics   Alcohol use: No   Drug use: No         OPHTHALMIC EXAM:  Base Eye Exam    Visual Acuity (Snellen - Linear)      Right Left   Dist cc 20/25 -2 20/30   Dist ph cc NI NI       Tonometry (Tonopen, 10:16 AM)      Right Left   Pressure 21 20       Pupils      Dark Light Shape React APD   Right 3 2 Round Brisk None   Left 3 2 Round Brisk None       Visual Fields (Counting fingers)      Left Right    Full Full       Extraocular Movement      Right Left    Full Full       Neuro/Psych    Oriented x3: Yes   Mood/Affect: Normal       Dilation    Both eyes: 1.0% Mydriacyl, 2.5% Phenylephrine @ 10:15 AM        Slit Lamp and Fundus Exam    Slit Lamp Exam      Right Left   Lids/Lashes Dermatochalasis - upper lid, mild Meibomian gland dysfunction Dermatochalasis - upper lid, mild Meibomian gland dysfunction   Conjunctiva/Sclera White and quiet White and quiet   Cornea Trace Punctate epithelial erosions Trace Punctate epithelial erosions   Anterior Chamber Deep and clear, narrow temporal angle, No cell or flare Deep and clear, narrow temporal angle, No cell or flare   Iris Round and dilated Round and dilated   Lens 2+ Nuclear sclerosis, 2+ Cortical cataract, 1-2+ Posterior subcapsular cataract 2+  Nuclear sclerosis, 2+ Cortical cataract, 1+ Posterior subcapsular cataract   Vitreous Vitreous syneresis Vitreous syneresis       Fundus Exam      Right Left   Disc Pink and Sharp, +cupping Pink and Sharp, +cupping, +PPP   C/D Ratio 0.7 0.75   Macula Flat, Good foveal reflex, RPE mottling and clumping, No heme or edema Flat, Good foveal reflex, RPE mottling and clumping, No heme or edema   Vessels Mild Vascular attenuation Mild Vascular attenuation   Periphery Attached, No heme  Attached, no heme             IMAGING AND PROCEDURES  Imaging and Procedures for @TODAY @  OCT, Retina - OU - Both Eyes       Right Eye Quality was good. Central Foveal Thickness: 245. Progression has been stable. Findings include abnormal foveal contour, no IRF, no SRF, vitreomacular adhesion  (?early ellipsoid signal loss -- stable from prior).   Left Eye Quality was good. Central Foveal Thickness: 248. Progression has been stable. Findings include abnormal foveal contour, no IRF, no SRF, vitreous traction (Mild progression of VMA with ?parafoval decrease in ellipsoid -- no worse than prior).   Notes *Images captured and stored on drive  Diagnosis / Impression:  Very mild parafoveal ellipsoid signal loss -- ?early Plaquenil toxicity OU - stable from prior OU  Clinical management:  See below  Abbreviations: NFP - Normal foveal profile. CME - cystoid macular edema. PED - pigment epithelial detachment. IRF - intraretinal fluid. SRF - subretinal fluid. EZ - ellipsoid zone. ERM - epiretinal membrane. ORA - outer retinal atrophy. ORT - outer retinal tubulation. SRHM - subretinal hyper-reflective material  ASSESSMENT/PLAN:    ICD-10-CM   1. Long-term use of Plaquenil  Z79.899   2. Retinal edema  H35.81 OCT, Retina - OU - Both Eyes  3. Essential hypertension  I10   4. Hypertensive retinopathy of both eyes  H35.033   5. Anatomical narrow angle glaucoma  H40.039   6. Combined  forms of age-related cataract of both eyes  H25.813     1. 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 Kathleen Argue office consistent with toxic maculopathy  - OCT today shows early parafoveal ellipsoid thinning OU -- no frank EZ loss -- no significant change from prior  - discussed findings, prognosis and possibility of progression despite discontinuation of medication  - recommend monitoring  - f/u 6 months, DFE, OCT  2. No retinal edema on exam or OCT  3,4. Hypertensive retinopathy OU  - discussed importance of tight BP control  - monitor  5. Narrow angle glaucoma OU  - IOP today: 21,20  - under the expert management of Dr. Kathlen Mody  6. Mixed cataracts OU  - The symptoms of cataract, surgical options, and treatments and risks were discussed with patient.   - under the expert management of Dr. Kathlen Mody   Ophthalmic Meds Ordered this visit:  No orders of the defined types were placed in this encounter.      Return in about 6 months (around 12/28/2020) for f/u Plaquenil Toxicity OU, DFE, OCT.  There are no Patient Instructions on file for this visit.  This document serves as a record of services personally performed by Gardiner Sleeper, MD, PhD. It was created on their behalf by Leeann Must, [place], an ophthalmic technician. The creation of this record is the provider's dictation and/or activities during the visit.    Electronically signed by: Leeann Must, COA 09.16.2021 1:19 PM   This document serves as a record of services personally performed by Gardiner Sleeper, MD, PhD. It was created on their behalf by San Jetty. Owens Shark, OA an ophthalmic technician. The creation of this record is the provider's dictation and/or activities during the visit.    Electronically signed by: San Jetty. Owens Shark, [place] 09.20.2021 1:19  PM  Gardiner Sleeper, M.D., Ph.D. Diseases & Surgery of the Retina and [place] 06/30/2020   I have reviewed the above documentation for accuracy and completeness, and I agree with the above. Gardiner Sleeper, M.D., Ph.D. 06/30/20 1:19 PM    Abbreviations: M myopia (nearsighted); A astigmatism; H hyperopia (farsighted); P presbyopia; Mrx spectacle prescription;  CTL contact lenses; OD right eye; OS left eye; OU both eyes  XT exotropia; ET esotropia; PEK punctate epithelial keratitis; PEE punctate epithelial erosions; DES dry eye syndrome; MGD meibomian gland dysfunction; ATs artificial tears; PFAT's preservative free artificial tears; [place] nuclear sclerotic cataract; PSC posterior subcapsular cataract; ERM epi-retinal membrane; PVD posterior vitreous detachment; RD retinal detachment; DM diabetes mellitus; DR diabetic retinopathy; NPDR non-proliferative diabetic retinopathy; PDR proliferative diabetic retinopathy; CSME clinically significant macular edema; DME diabetic macular edema; dbh dot blot hemorrhages; CWS cotton wool spot; POAG primary open angle glaucoma; C/D cup-to-disc ratio; HVF humphrey visual field; GVF goldmann visual field; OCT optical coherence tomography; IOP intraocular pressure; BRVO Branch retinal vein occlusion; CRVO central retinal vein occlusion; CRAO central retinal artery occlusion;  BRAO branch retinal artery occlusion; RT retinal tear; SB scleral buckle; PPV pars plana vitrectomy; VH Vitreous hemorrhage; PRP panretinal laser photocoagulation; IVK intravitreal kenalog; VMT vitreomacular traction; MH Macular hole;  NVD neovascularization of the disc; NVE neovascularization elsewhere; AREDS age related eye disease study; ARMD age related macular degeneration; POAG primary open angle glaucoma; EBMD epithelial/anterior basement membrane dystrophy; ACIOL anterior chamber intraocular lens; IOL intraocular lens; PCIOL posterior chamber intraocular lens;  Phaco/IOL phacoemulsification with intraocular lens placement; [place] photorefractive keratectomy; LASIK laser assisted in situ keratomileusis; HTN hypertension; DM diabetes mellitus; COPD chronic obstructive pulmonary disease

## 2020-06-30 ENCOUNTER — Ambulatory Visit (INDEPENDENT_AMBULATORY_CARE_PROVIDER_SITE_OTHER): Payer: BC Managed Care – PPO | Admitting: Ophthalmology

## 2020-06-30 ENCOUNTER — Other Ambulatory Visit: Payer: Self-pay

## 2020-06-30 ENCOUNTER — Encounter (INDEPENDENT_AMBULATORY_CARE_PROVIDER_SITE_OTHER): Payer: Self-pay | Admitting: Ophthalmology

## 2020-06-30 DIAGNOSIS — Z79899 Other long term (current) drug therapy: Secondary | ICD-10-CM | POA: Diagnosis not present

## 2020-06-30 DIAGNOSIS — H40039 Anatomical narrow angle, unspecified eye: Secondary | ICD-10-CM

## 2020-06-30 DIAGNOSIS — I10 Essential (primary) hypertension: Secondary | ICD-10-CM

## 2020-06-30 DIAGNOSIS — H35033 Hypertensive retinopathy, bilateral: Secondary | ICD-10-CM | POA: Diagnosis not present

## 2020-06-30 DIAGNOSIS — H25813 Combined forms of age-related cataract, bilateral: Secondary | ICD-10-CM

## 2020-06-30 DIAGNOSIS — H3581 Retinal edema: Secondary | ICD-10-CM | POA: Diagnosis not present

## 2020-07-08 ENCOUNTER — Encounter (INDEPENDENT_AMBULATORY_CARE_PROVIDER_SITE_OTHER): Payer: BC Managed Care – PPO | Admitting: Ophthalmology

## 2020-07-11 ENCOUNTER — Encounter (HOSPITAL_COMMUNITY): Payer: Self-pay | Admitting: *Deleted

## 2020-07-22 ENCOUNTER — Encounter (HOSPITAL_COMMUNITY): Payer: BC Managed Care – PPO | Admitting: Cardiology

## 2020-08-19 ENCOUNTER — Ambulatory Visit (HOSPITAL_COMMUNITY)
Admission: RE | Admit: 2020-08-19 | Discharge: 2020-08-19 | Disposition: A | Discharge status: Home / Self Care | Payer: BC Managed Care – PPO | Source: Ambulatory Visit | Attending: Cardiology | Admitting: Cardiology

## 2020-08-19 ENCOUNTER — Other Ambulatory Visit: Payer: Self-pay

## 2020-08-19 ENCOUNTER — Encounter (HOSPITAL_COMMUNITY): Payer: Self-pay | Admitting: Cardiology

## 2020-08-19 VITALS — BP 120/78 | HR 83 | Wt 187.4 lb

## 2020-08-19 DIAGNOSIS — J8410 Pulmonary fibrosis, unspecified: Secondary | ICD-10-CM | POA: Insufficient documentation

## 2020-08-19 DIAGNOSIS — I5032 Chronic diastolic (congestive) heart failure: Secondary | ICD-10-CM

## 2020-08-19 DIAGNOSIS — Z7901 Long term (current) use of anticoagulants: Secondary | ICD-10-CM | POA: Insufficient documentation

## 2020-08-19 DIAGNOSIS — Z7982 Long term (current) use of aspirin: Secondary | ICD-10-CM | POA: Insufficient documentation

## 2020-08-19 DIAGNOSIS — Z79899 Other long term (current) drug therapy: Secondary | ICD-10-CM | POA: Diagnosis not present

## 2020-08-19 DIAGNOSIS — Z7952 Long term (current) use of systemic steroids: Secondary | ICD-10-CM | POA: Diagnosis not present

## 2020-08-19 DIAGNOSIS — I059 Rheumatic mitral valve disease, unspecified: Secondary | ICD-10-CM | POA: Diagnosis not present

## 2020-08-19 DIAGNOSIS — Z952 Presence of prosthetic heart valve: Secondary | ICD-10-CM | POA: Insufficient documentation

## 2020-08-19 DIAGNOSIS — J849 Interstitial pulmonary disease, unspecified: Secondary | ICD-10-CM

## 2020-08-19 DIAGNOSIS — I5022 Chronic systolic (congestive) heart failure: Secondary | ICD-10-CM | POA: Insufficient documentation

## 2020-08-19 DIAGNOSIS — M351 Other overlap syndromes: Secondary | ICD-10-CM | POA: Insufficient documentation

## 2020-08-19 DIAGNOSIS — I272 Pulmonary hypertension, unspecified: Secondary | ICD-10-CM | POA: Insufficient documentation

## 2020-08-19 DIAGNOSIS — I270 Primary pulmonary hypertension: Secondary | ICD-10-CM | POA: Diagnosis not present

## 2020-08-19 LAB — CBC
HCT: 40.9 % (ref 36.0–46.0)
Hemoglobin: 13.1 g/dL (ref 12.0–15.0)
MCH: 32.4 pg (ref 26.0–34.0)
MCHC: 32 g/dL (ref 30.0–36.0)
MCV: 101.2 fL — ABNORMAL HIGH (ref 80.0–100.0)
Platelets: 223 10*3/uL (ref 150–400)
RBC: 4.04 MIL/uL (ref 3.87–5.11)
RDW: 14.4 % (ref 11.5–15.5)
WBC: 4.7 10*3/uL (ref 4.0–10.5)
nRBC: 0 % (ref 0.0–0.2)

## 2020-08-19 LAB — BASIC METABOLIC PANEL
Anion gap: 6 (ref 5–15)
BUN: 9 mg/dL (ref 6–20)
CO2: 30 mmol/L (ref 22–32)
Calcium: 8.7 mg/dL — ABNORMAL LOW (ref 8.9–10.3)
Chloride: 104 mmol/L (ref 98–111)
Creatinine, Ser: 0.69 mg/dL (ref 0.44–1.00)
GFR, Estimated: >60 mL/min (ref >60)
Glucose, Bld: 87 mg/dL (ref 70–99)
Potassium: 4.4 mmol/L (ref 3.5–5.1)
Sodium: 140 mmol/L (ref 135–145)

## 2020-08-19 LAB — BRAIN NATRIURETIC PEPTIDE: B Natriuretic Peptide: 76.2 pg/mL (ref 0.0–100.0)

## 2020-08-19 NOTE — Patient Instructions (Signed)
Labs done today, your results will be available in MyChart, we will contact you for abnormal readings.  Your physician has requested that you have an echocardiogram. Echocardiography is a painless test that uses sound waves to create images of your heart. It provides your doctor with information about the size and shape of your heart and how well your heart's chambers and valves are working. This procedure takes approximately one hour. There are no restrictions for this procedure.  You have been referred to Dr Chase Caller at [facility], they will call you for an appointment  Please call our office in April 2022 to schedule your follow up appointment  If you have any questions or concerns before your next appointment please send [place] a message through [place] or call our office at [phone].    TO LEAVE A MESSAGE FOR THE NURSE SELECT OPTION 2, PLEASE LEAVE A MESSAGE INCLUDING: . YOUR NAME . DATE OF BIRTH . CALL BACK NUMBER . REASON FOR CALL**this is important as we prioritize the call backs  [place] AS LONG AS YOU CALL BEFORE 4:00 PM  At the [facility], you and your health needs are our priority. As part of our continuing mission to provide you with exceptional heart care, we have created designated Provider Care Teams. These Care Teams include your primary Cardiologist (physician) and Advanced Practice Providers (APPs- Physician Assistants and Nurse Practitioners) who all work together to provide you with the care you need, when you need it.   You may see any of the following providers on your designated Care Team at your next follow up: Marland Kitchen Dr Glori Bickers . Dr Loralie Champagne . Darrick Grinder, NP . Lyda Jester, PA . Audry Riles, PharmD   Please be sure to bring in all your medications bottles to every appointment.

## 2020-08-19 NOTE — Progress Notes (Signed)
Patient ID: Katie [name], female   DOB: [birth year], 54 y.o.   MRN: [id number] PCP: Dr. Shelia Media Cardiology: Dr . Aundra Dubin  54 y.o. 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    Echo in 1/19 showed EF 60-65%, mechanical mitral valve with mean gradient 5 mmHg, and PA systolic pressure 33 mmHg with normal RV.  Echo in 8/20 showed EF 50-55%, normal RV, mechanical MV with mean gradient 6 mmHg, unable to estimate PA systolic pressure, normal RV.  No change from prior.   Patient has been doing well recently.  Breathing "fine."  No dyspnea with normal activities.  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  No chest pain, no orthopnea/PND, no lightheadedness or palpitations. Weight is fairly stable.   ECG (personally reviewed): NSR, RBBB  Labs (8/11): HCT 27 => 30, creatinine 0.98, INR 4 Labs (9/11): K 4.6, creatinine 0.9, HCT 32.7, BNP 214=>146, LFTs normal, TSH normal Labs (1/12): HCT 37.9, K 3.5, creatinine 0.9 Labs (4/12): LDL 108, HDL 43, TSH normal, K 4.1, creatinine 0.8, HCT 37.9 Labs (1/13): K 3.8, creatinine 0.8 Labs (11/14): K 3.6, creatinine 0.71, LDL 94, HCT 38 Labs (12/15): Hgb 12.5 Labs (2/16): K 3.9, creatinine 0.84 Labs (8/16): K 3.9, creatinine 0.87, HCT 38.6 Labs (4/17): K 3.6, creatinine 0.88, LDL 92, HDL 38, hgb 11.7 Labs (10/17): K 3.6, creatinine 0.81, hgb 12.1 Labs (1/20): K 4.3, creatinine 0.79, LDL 107, hgb 12 Labs (6/21): creatinine 0.7  Allergies (verified):  1)  ! Methotrexate  Past Medical History: 1. Mixed connective tissue disease: diagnosis 1990. 8/10 ANA positive, anti-dsDNA positive, anti-SCL70 negative 2. 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 RESTRICTIVE LUNG DZ secondary to MCTD: pulmonary  fibrosis.     - PFT's 03/06/99  VC 47%  DLC0 26%    - PFT's 09/12/08  VC 43%  DLC0 45%    - PFT's 6/10 FVC 37%, FEV1 34%, ratio 69%, TLC 50%    - PFTs 11/15 FVC 39%, FEV1 35%, ratio 89%, TLC 63%, DLCO 48% 4. Nephrotic syndrome: secondary to MCTD 5. CHF: Secondary to moderate mitral stenosis and moderate mitral regurgitation in the setting of rheumatic mitral valve disease as well as severe pulmonary HTN likely due to a combination of MV disease and pulmonary arterial HTN in the setting of collagen vascular disease.   Not valvuloplasty candidate due to MR.  Patient had MV replacement with mechanical MV in 8/11.  Echo (8/11) post-op showed normally functioning mechanical MV, EF 60-65% with septal bounce, small mobile structure in the mid ventricle (? loose papillary muscle), RV-RA gradient 22 mmHg.  Echo (10/11): EF 60-65%, normally functioning mechanical mitral valve.  Echo (7/13): EF 55-60%, mechanical mitral valve with mean gradient 4 mmHg, PA systolic pressure 29 mmHg. Echo (5/15) with EF 55-60%, mechanical mitral valve functioning normally, normal RV size and systolic function, PA systolic pressure 32 mmHg.  - Echo in 5/17 showed EF 60-65%, normal mechanical mitral valve, and PA systolic pressure 33 mmHg with normal RV.  - Echo (1/19): EF 60-65%, mild LVH, normal RV size and systolic function, mechanical mitral valve with mean gradient 6 mmHg, PASP 33 mmHg.  - Echo (8/20): EF 50-55%, normal RV, mechanical MV with mean gradient 6 mmHg, unable to estimate PA systolic pressure, normal RV.  No change from  prior.  6.  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  CTA chest (7/10) with no evidence for pulmonary emboli or chronic pulmonary emboli.  PA pressure 72/28 mean 50 with PVR 5.7 WU on initial RHC, PA pressure 45/21 mean 30 PVR 2.3 WU on f/u RHC on Revatio 80 mg three times a day (1/11).  After MV surgery, Revatio  stopped.  Increased dyspnea.  Repeat RHC with mean RA 6, PA 42/20 (mean 29), PVR 4.8 WU, CI 2.3.  Revatio 20 mg three times a day restarted.  Echo (1/54) with PA systolic pressure 29 mmHg. Echo (0/08) with PA systolic pressure 32 mmHg.  - 3/11 6 min walk 427 m - 2/12 6 min walk 427 m - 1/13 6 min walk 411 m - 4/15 6 min walk 448 m - 2/16 6 min walk 421 m - 8/16 6 min walk 354 m - 9/17 6 min walk 436 m - 1/19 6 min walk 366 m - 10/19 6 min walk 402 m - 8/20 6 min walk 137 m (but walked slowly and stopped early due to knee pain).  7.  Rheumatic fever 8.  LHC (9/10): no angiographic CAD.  9.  Warfarin anticoagulation for mechanical MV, goal INR 2.5-3.5 10.  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  11. 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 C difficile diarrhea 1/12 13. GERD 14. Holter (1/19): No significant arrhythmias.   Family History: Emphysema mother, smoker.  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   ROS: All systems reviewed and negative except as noted in HPI.   Current Outpatient Medications  Medication Sig Dispense Refill  . acetaminophen (TYLENOL) 325 MG tablet Take 650 mg by mouth. As directed as needed     . aspirin 81 MG EC tablet Take 81 mg by mouth daily.      Marland Kitchen azaTHIOprine (IMURAN) 50 MG tablet Take 50 mg by mouth daily.     . belimumab (BENLYSTA) 400 MG SOLR injection Inject 10 mg/kg into the vein every 30 (thirty) days.    . Calcium Citrate-Vitamin D (CALCIUM + D PO) Take by mouth.    . furosemide (LASIX) 20 MG tablet TAKE 1 TABLET DAILY (Patient taking differently: Take 20 mg by mouth daily. ) 90 tablet 3  . magnesium oxide (MAG-OX) 400 MG tablet Take 400 mg by mouth daily.    . metoprolol succinate (TOPROL-XL) 25 MG 24 hr tablet TAKE 1/2 TABLET (= 12.5MG   TOTAL) DAILY 45 tablet 3  . Multiple Vitamin (MULTIVITAMIN) tablet  Take 1 tablet by mouth daily.      . potassium chloride SA (KLOR-CON M20) 20 MEQ tablet Take 1 tablet (20 mEq total) by mouth 2 (two) times daily. 180 tablet 3  . predniSONE (DELTASONE) 5 MG tablet Take 5 mg by mouth daily.      . sildenafil (REVATIO) 20 MG tablet Take 1 tablet (20 mg total) by mouth 3 (three) times daily. 90 tablet 11  . valACYclovir (VALTREX) 1000 MG tablet Take 1,000 mg by mouth daily.    . Vitamin D, Cholecalciferol, 400 units CAPS Take by mouth.    . warfarin (COUMADIN) 5 MG tablet Take as directed by the anticoagulation clinic. (Patient taking differently: Take 5-7.5 mg by mouth See admin instructions. 7.5mg  on Sun,Mon,Wed, Fri, Sat 5mg  on Tues,Thursday) 135 tablet 3  No current facility-administered medications for this encounter.    BP 120/78   Pulse 83   Wt 85 kg (187 lb 6.4 oz)   SpO2 97%   BMI 32.17 kg/m  General: NAD Neck: No JVD, no thyromegaly or thyroid nodule.  Lungs: Clear to auscultation bilaterally with normal respiratory effort. CV: Nondisplaced PMI.  Heart regular S1/S2 with mechanical S1, no S3/S4, 1/6 SEM RUSB.  No peripheral edema.  No carotid bruit.  Normal pedal pulses.  Abdomen: Soft, nontender, no hepatosplenomegaly, no distention.  Skin: Intact without lesions or rashes.  Neurologic: Alert and oriented x 3.  Psych: Normal affect. Extremities: No clubbing or cyanosis.  HEENT: Normal.   Assessment/Plan:  1. Status post mechanical MVR:  Mitral valve replacement, stable from valve standpoint. Echo in 8/20 showed mean gradient 6 mmHg without significant MR.  She is on coumadin goal INR 2.5 - 3.5 and ASA 81 mg daily.  - Check CBC today.   - I will arrange for repeat echo.  2.  Pulmonary HTN:  Pulmonary HTN is likely a mixed picture, from mitral valve disease as well as mixed connective tissue disease.  She had mild residual pulmonary HTN with moderately increased PVR even after mitral valve surgery on last RHC though this was improved compared  to initial RHC.  The residual pulmonary HTN may be due to pulmonary vascular remodeling with chronic left atrial pressure elevation in the setting of mitral valve disease. Revatio was restarted at 20 mg tid.  Most recent echo in 8/20 showed RV normal but unable to estimate PA systolic pressure.  Symptomatically NYHA class I-II. - I will arrange for repeat echo to reassess RV and noninvasive measurement of PA pressure.  - Continue current Lasix 20 mg daily and check BMET.  - BNP today.  - Continue current Revatio. - needs 6 minute walk next appt.  3. Mixed connective tissue disorder: Patient has history of pulmonary fibrosis. Followed by rheumatology at [facility].  - Will refer to ILD program at [facility] pulmonary with Dr. Chase Caller.  - On prednisone and azathioprine.   Followup in 6 months with me.   Loralie Champagne 08/19/2020

## 2020-09-02 ENCOUNTER — Ambulatory Visit (HOSPITAL_COMMUNITY)
Admission: RE | Admit: 2020-09-02 | Discharge: 2020-09-02 | Disposition: A | Discharge status: Home / Self Care | Payer: BC Managed Care – PPO | Source: Ambulatory Visit | Attending: Internal Medicine | Admitting: Internal Medicine

## 2020-09-02 ENCOUNTER — Other Ambulatory Visit: Payer: Self-pay

## 2020-09-02 DIAGNOSIS — I272 Pulmonary hypertension, unspecified: Secondary | ICD-10-CM | POA: Diagnosis not present

## 2020-09-02 DIAGNOSIS — I270 Primary pulmonary hypertension: Secondary | ICD-10-CM | POA: Diagnosis not present

## 2020-09-02 DIAGNOSIS — I2510 Atherosclerotic heart disease of native coronary artery without angina pectoris: Secondary | ICD-10-CM | POA: Insufficient documentation

## 2020-09-02 DIAGNOSIS — I509 Heart failure, unspecified: Secondary | ICD-10-CM | POA: Insufficient documentation

## 2020-09-02 LAB — ECHOCARDIOGRAM COMPLETE
Area-P 1/2: 2.59 cm2
P 1/2 time: 85.9 msec
S' Lateral: 3.5 cm

## 2020-09-02 NOTE — Progress Notes (Signed)
  Echocardiogram 2D Echocardiogram has been performed.  Katie [name] 09/02/2020, 11:17 AM

## 2020-09-09 ENCOUNTER — Telehealth (HOSPITAL_COMMUNITY): Payer: Self-pay

## 2020-09-09 NOTE — Telephone Encounter (Signed)
Malena Edman, RN  09/09/2020 1:36 PM EST Back to Top    Patient aware and verbalized understanding    [place], [place]  09/08/2020 3:40 PM EST     [facility]   Philicia R Branch, CMA  09/03/2020 9:26 AM EST     [facility]

## 2020-09-09 NOTE — Telephone Encounter (Signed)
-----   Message from Larey Dresser, MD sent at 09/02/2020  6:45 PM EST ----- Stable, normal EF and normally functioning mechanical MV

## 2020-09-30 ENCOUNTER — Other Ambulatory Visit (HOSPITAL_COMMUNITY): Payer: Self-pay | Admitting: Cardiology

## 2020-11-19 ENCOUNTER — Encounter: Payer: Self-pay | Admitting: Internal Medicine

## 2020-11-19 ENCOUNTER — Other Ambulatory Visit: Payer: Self-pay

## 2020-11-19 ENCOUNTER — Ambulatory Visit: Payer: BC Managed Care – PPO | Admitting: Internal Medicine

## 2020-11-19 VITALS — BP 112/78 | HR 87 | Temp 98.0°F | Ht 64.0 in | Wt 190.0 lb

## 2020-11-19 DIAGNOSIS — R0600 Dyspnea, unspecified: Secondary | ICD-10-CM

## 2020-11-19 DIAGNOSIS — R942 Abnormal results of pulmonary function studies: Secondary | ICD-10-CM | POA: Diagnosis not present

## 2020-11-19 DIAGNOSIS — D84821 Immunodeficiency due to drugs: Secondary | ICD-10-CM

## 2020-11-19 DIAGNOSIS — M329 Systemic lupus erythematosus, unspecified: Secondary | ICD-10-CM

## 2020-11-19 DIAGNOSIS — Z8709 Personal history of other diseases of the respiratory system: Secondary | ICD-10-CM | POA: Diagnosis not present

## 2020-11-19 DIAGNOSIS — J849 Interstitial pulmonary disease, unspecified: Secondary | ICD-10-CM

## 2020-11-19 DIAGNOSIS — R0609 Other forms of dyspnea: Secondary | ICD-10-CM

## 2020-11-19 DIAGNOSIS — R0902 Hypoxemia: Secondary | ICD-10-CM

## 2020-11-19 DIAGNOSIS — Z79899 Other long term (current) drug therapy: Secondary | ICD-10-CM

## 2020-11-19 DIAGNOSIS — Z7185 Encounter for immunization safety counseling: Secondary | ICD-10-CM

## 2020-11-19 NOTE — Progress Notes (Signed)
OV 11/19/2020  Subjective:  Patient ID: Katie [name], female , DOB: 25-Aug-1966 , age 55 y.o. , MRN: [id number] , ADDRESS: [address] [place] [address] PCP Deland Pretty, MD Patient Care Team: Deland Pretty, MD as PCP - General (Internal Medicine)  This Provider for this visit: Treatment Team:  Attending Provider: Brand Males, MD    11/19/2020 -   Chief Complaint  Patient presents with  . Consult    ILD consult. Chronic cough, SOB with exertion. Some wheezing     HPI Katie [name] 55 y.o. -presents for new consultation.  She is to see Dr. Christinia Gully but that was several years ago and therefore is a new consultation.  She has been referred by Dr. Loralie Champagne based on chart review and her history.  She has mitral valve prolapse and she says on a 6-minute walk test she desaturated.  And given her lupus she has been referred here.  She gives a history of SLE since early 1s.  She is currently on immunosuppressed with prednisone, Imuran and also Benlysta infusions.  She says lupus initially involved her kidneys but does not at this point in time.  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  Also involves the heart and is the basis of mitral valve repair.  She says she also has Raynaud's.  Overall she feels lupus is well controlled.  She works as a Teacher, early years/pre  In terms of pulmonary in 2012 she had CT scan of the chest that reported emphysema but no obvious description of fibrosis but chest x-rays in 2015 reported fibrosis she does not have any CT scan of the chest at that time.  Her last PFTs were in 2015 that is reviewed below and visualized the image myself.  It shows restriction with a low DLCO consistent with someone with fibrosis.  In terms of her symptoms she has stable dyspnea on exertion.  She works as a Teacher, early years/pre.  She has chronic cough that for which she believes she will benefit from any  symptom relief.  She has had a Covid vaccine but the response to the vaccine is not known.  There is no Covid IgG check so far.  She is open to using oxygen but does not want to use it at work.  She denies any mold exposure or bird exposure or feather dust exposure or having down pillow   Echocardiogram #2021  -Normal functioning mitral valve that was repaired.  Normal ejection fraction normal right ventricular function   Sleep study 2017   -Apnea-hypopnea index 2.7  Results for Katie, [name] (MRN [id number]) as of 11/19/2020 10:00  Ref. Range 06/10/2009 20:08  Anti Nuclear Antibody (ANA) Latest Ref Range: NEGATIVE  POS (A)  ANA Pattern 1 Unknown SPECKLED  ANA Titer 1 Latest Ref Range: <1:40  1:320 (H)  ds DNA Ab Latest Ref Range: <5  69 (H)  Scleroderma (Scl-70) (ENA) Antibody, IgG Latest Ref Range: <1.0  0.5     Walk test   Simple office walk 185 feet x  3 laps goal with forehead probe 11/19/2020   O2 used ra  Number laps completed 3  Comments about pace x  Resting Pulse Ox/HR 100% and 87/min  Final Pulse Ox/HR 88% and 110/min  Desaturated </= 88% yes  Desaturated <= 3% points yes  Got Tachycardic >/= 90/min yes  Symptoms at end of test x  Miscellaneous comments x   '  CT Chest data    PFT  PFT Results Latest Ref Rng & Units 08/16/2014 02/08/2014  FVC-Pre L 1.16 1.14  FVC-Predicted Pre % 39 38  FVC-Post L 1.25 1.16  FVC-Predicted Post % 42 39  Pre FEV1/FVC % % 73 71  Post FEV1/FCV % % 76 78  FEV1-Pre L 0.85 0.82  FEV1-Predicted Pre % 35 34  FEV1-Post L 0.95 0.90  DLCO uncorrected ml/min/mmHg 11.83 11.54  DLCO UNC% % 48 47  DLVA Predicted % 104 99  TLC L 3.23 3.04  TLC % Predicted % 63 60  RV % Predicted % 112 108      Results for Katie, [name] (MRN [id number]) as of 11/19/2020 10:00  Ref. Range 08/19/2020 10:33  Creatinine Latest Ref Range: 0.44 - 1.00 mg/dL 0.69  Results for Katie, [name] (MRN [id number]) as of 11/19/2020 10:00  Ref. Range 08/19/2020  10:33  Hemoglobin Latest Ref Range: 12.0 - 15.0 g/dL 13.1   ECHO 09/02/2020  IMPRESSIONS    1. Left ventricular ejection fraction, by estimation, is 60 to 65%. The  left ventricle has normal function. The left ventricle has no regional  wall motion abnormalities. Left ventricular diastolic function could not  be evaluated.  2. Right ventricular systolic function was not well visualized. The right  ventricular size is normal. There is normal pulmonary artery systolic  pressure. The estimated right ventricular systolic pressure is 40.9 mmHg.  3. Left atrial size was mildly dilated.  4. The mitral valve has been repaired/replaced. No evidence of mitral  valve regurgitation. No evidence of mitral stenosis. The mean mitral valve  gradient is 5.2 mmHg with average heart rate of 89 bpm. There is a  mechanical valve present in the mitral  position. Procedure Date: 05/19/2010. Echo findings are consistent with  normal structure and function of the mitral valve prosthesis.  5. The aortic valve is normal in structure. Aortic valve regurgitation is  not visualized. No aortic stenosis is present.  6. The inferior vena cava is normal in size with greater than 50%  respiratory variability, suggesting right atrial pressure of 3 mmHg.   Comparison(s): No significant change from prior study. Prior images  reviewed side by side.   FINDINGS  Left Ventricle: Left ventricular ejection fraction, by estimation, is 60  to 65%. The left ventricle has normal function. The left ventricle has no  regional wall motion abnormalities. The left ventricular internal cavity  size was normal in size. There is  no left ventricular hypertrophy. Abnormal (paradoxical) septal motion  consistent with post-operative status. Left ventricular diastolic function  could not be evaluated due to mitral valve replacement. Left ventricular  diastolic function could not be  evaluated.   Right Ventricle: The right  ventricular size is normal. No increase in  right ventricular wall thickness. Right ventricular systolic function was  not well visualized. There is normal pulmonary artery systolic pressure.  The tricuspid regurgitant velocity is  2.34 m/s, and with an assumed right atrial pressure of 3 mmHg, the  estimated right ventricular systolic pressure is 73.5 mmHg.   Left Atrium: Left atrial size was mildly dilated.   Right Atrium: Right atrial size was normal in size.   Pericardium: There is no evidence of pericardial effusion.   Mitral Valve: The mitral valve has been repaired/replaced. No evidence of  mitral valve regurgitation. There is a mechanical valve present in the  mitral position. Procedure Date: 05/19/2010. Echo findings are  consistent  with normal structure and function  of the mitral valve prosthesis. No evidence of mitral valve stenosis. The  mean mitral valve gradient is 5.2 mmHg with average heart rate of 89 bpm.   Tricuspid Valve: The tricuspid valve is normal in structure. Tricuspid  valve regurgitation is mild . No evidence of tricuspid stenosis.   Aortic Valve: The aortic valve is normal in structure. Aortic valve  regurgitation is not visualized. No aortic stenosis is present.   Pulmonic Valve: The pulmonic valve was normal in structure. Pulmonic valve  regurgitation is not visualized. No evidence of pulmonic stenosis.   Aorta: The aortic root is normal in size and structure.   Venous: The inferior vena cava is normal in size with greater than 50%  respiratory variability, suggesting right atrial pressure of 3 mmHg.   IAS/Shunts: No atrial level shunt detected by color flow      has a past medical history of Abscess of right breast, C. difficile diarrhea (1/12), CAD (coronary artery disease), Cardiac tamponade, CHF (congestive heart failure) ([place]), CHF (congestive heart failure) ([place]), Mixed connective tissue disease ([place]), Nephrotic syndrome, Pulmonary HTN ([place]),  Pulmonary HTN ([place]), Raynaud's syndrome, Restrictive lung disease, Rheumatic fever, and Warfarin anticoagulation.   reports that she has never smoked. She has never used smokeless tobacco.  Past Surgical History:  Procedure Laterality Date  . BREAST CYST ASPIRATION  03/09/2012  . CESAREAN SECTION    . COLONOSCOPY    . s/p mitral valvue replacement  05/13/2010   by Dr. Roxy Manns by minimally invasive window.   . TUBAL LIGATION      Allergies  Allergen Reactions  . Cephalexin Other (See Comments)    HIVES  . Methotrexate     REACTION: intolerance    Immunization History  Administered Date(s) Administered  . Influenza Split 09/22/2013, 07/11/2014  . Influenza,inj,Quad PF,6+ Mos 07/15/2018  . PFIZER(Purple Top)SARS-COV-2 Vaccination 06/23/2020  . Unspecified SARS-COV-2 Vaccination 12/10/2019    Family History  Problem Relation Age of Onset  . Emphysema Mother        smoker  . COPD Mother   . Heart disease Maternal Grandmother        side of family not given  . Diabetes Maternal Grandmother   . Hypertension Father   . Osteoarthritis Father   . Sleep apnea Father   . Breast cancer Maternal Aunt      Current Outpatient Medications:  .  acetaminophen (TYLENOL) 325 MG tablet, Take 650 mg by mouth. As directed as needed, Disp: , Rfl:  .  aspirin 81 MG EC tablet, Take 81 mg by mouth daily., Disp: , Rfl:  .  azaTHIOprine (IMURAN) 50 MG tablet, Take 50 mg by mouth daily. , Disp: , Rfl:  .  belimumab (BENLYSTA) 400 MG SOLR injection, Inject 10 mg/kg into the vein every 30 (thirty) days., Disp: , Rfl:  .  Calcium Citrate-Vitamin D (CALCIUM + D PO), Take by mouth., Disp: , Rfl:  .  furosemide (LASIX) 20 MG tablet, TAKE 1 TABLET DAILY, Disp: 90 tablet, Rfl: 3 .  magnesium oxide (MAG-OX) 400 MG tablet, Take 400 mg by mouth daily., Disp: , Rfl:  .  metoprolol succinate (TOPROL-XL) 25 MG 24 hr tablet, TAKE 1/2 TABLET DAILY, Disp: 45 tablet, Rfl: 3 .  Multiple Vitamin (MULTIVITAMIN) tablet,  Take 1 tablet by mouth daily., Disp: , Rfl:  .  potassium chloride SA (KLOR-CON M20) 20 MEQ tablet, Take 1 tablet (20 mEq total) by mouth 2 (two)  times daily., Disp: 180 tablet, Rfl: 3 .  predniSONE (DELTASONE) 5 MG tablet, Take 5 mg by mouth daily., Disp: , Rfl:  .  sildenafil (REVATIO) 20 MG tablet, Take 1 tablet (20 mg total) by mouth 3 (three) times daily., Disp: 90 tablet, Rfl: 11 .  valACYclovir (VALTREX) 1000 MG tablet, Take 1,000 mg by mouth daily., Disp: , Rfl:  .  Vitamin D, Cholecalciferol, 400 units CAPS, Take by mouth., Disp: , Rfl:  .  warfarin (COUMADIN) 5 MG tablet, Take as directed by the anticoagulation clinic. (Patient taking differently: Take 5-7.5 mg by mouth See admin instructions. 7.5mg  on Sun,Mon,Wed, Fri, Sat 5mg  on Tues,Thursday), Disp: 135 tablet, Rfl: 3      Objective:   Vitals:   11/19/20 0941  BP: 112/78  Pulse: 87  Temp: 98 F (36.7 C)  SpO2: 99%  Weight: 190 lb (86.2 kg)  Height: 5\' 4"  (1.626 m)    Estimated body mass index is 32.61 kg/m as calculated from the following:   Height as of this encounter: 5\' 4"  (1.626 m).   Weight as of this encounter: 190 lb (86.2 kg).  @WEIGHTCHANGE @  [employer]   11/19/20 0941  Weight: 190 lb (86.2 kg)     Physical Exam  General Appearance:    Alert, cooperative, no distress, appears stated age - yes , Deconditioned looking - som , OBESE  - yuesno, Sitting on Wheelchair -  no  Head:    Normocephalic, without obvious abnormality, atraumatic  Eyes:    PERRL, conjunctiva/corneas clear,  Ears:    Normal TM's and external ear canals, both ears  Nose:   Nares normal, septum midline, mucosa normal, no drainage    or sinus tenderness. OXYGEN ON  - no . Patient is @ [facility]   Throat:   Lips, mucosa, and tongue normal; teeth and gums normal. Cyanosis on lips - no  Neck:   Supple, symmetrical, trachea midline, no adenopathy;    thyroid:  no enlargement/tenderness/nodules; no carotid   bruit or JVD  Back:      Symmetric, no curvature, ROM normal, no CVA tenderness  Lungs:     Distress - no , Wheeze no, Barrell Chest - no, Purse lip breathing - no, Crackles - some at apices   Chest Wall:    No tenderness or deformity.    Heart:    Regular rate and rhythm, S1 and S2 normal, no rub   or gallop, Murmur - no  Breast Exam:    NOT DONE  Abdomen:     Soft, non-tender, bowel sounds active all four quadrants,    no masses, no organomegaly. Visceral obesity - yes  Genitalia:   NOT DONE  Rectal:   NOT DONE  Extremities:   Extremities - normal, Has Cane - no, Clubbing - no, Edema - no  Pulses:   2+ and symmetric all extremities  Skin:   Stigmata of Connective Tissue Disease - STIGMATA of CONNECTIVE TISSUE DISEASE  - Distal digital fissuring (ie, "mechanic hands") - no - Distal digital tip ulceration - no -Inflammatory arthritis or polyarticular morning joint stiffness ?60 minutes - no - Palmar telangiectasia - no - Raynaud phenomenon - YS - Unexplained digital edema - yes - Unexplained fixed rash on the digital extensor surfaces (Gottron's sign) - no ... - Deformities of RA - no - Scleroderma  - no - Malar Rash -  no   Lymph nodes:   Cervical, supraclavicular, and axillary nodes normal  Psychiatric:  Neurologic:   Pleasant - yes, Anxious - no, Flat affect - no  CAm-ICU - neg, Alert and Oriented x 3 - yes, Moves all 4s - yes, Speech - normal, Cognition - intact          Assessment:       ICD-10-CM   1. History of interstitial lung disease  Z87.09   2. History of systemic lupus erythematosus (SLE) (HCC)  M32.9   3. Dyspnea on exertion  R06.00   4. Abnormal PFT  R94.2   5. Exercise hypoxemia  R09.02   6. Vaccine counseling  Z71.85   7. Immunosuppression due to drug therapy ([place])  D84.821    Z79.899   8. Interstitial pulmonary disease ([place])  J84.9        Plan:     Patient Instructions     ICD-10-CM   1. History of interstitial lung disease  Z87.09   2. History of systemic lupus  erythematosus (SLE) (HCC)  M32.9   3. Dyspnea on exertion  R06.00   4. Abnormal PFT  R94.2   5. Exercise hypoxemia  R09.02   6. Vaccine counseling  Z71.85   7. Immunosuppression due to drug therapy [facility])  [phone]    Z79.899     We need to establish the presence of interstitial lung disease and severity As discussed the feel of interstitial lung disease in the presence of lupus is changing a lot with new treatment now and also emerging research  Plan - check covid IgG 11/19/2020 - if not able you might qualify for monoclonal antibody prophylaxis against COVID -Do full pulmonary function test -Do high-resolution CT chest supine and prone -Do overnight oxygen study on room air -Take a prescription for portable oxygen 2 L nasal cannula -we will send it to DME company -Take ILD questionnaire version 3.0 packet and feels this up today or bring it back with you at next visit -Based on the above test results if there is significant amount of fibrosis he might qualify for medication called antifibrotic specifically nintedanib or pirfenidone -Can also address research as a clinical care option at follow-up - we d disussesd this briefly and appreciate your interst  Follow-up -Next few to several weeks but after completing all of the above tests     SIGNATURE    Dr. Brand Males, M.D., F.C.C.P,  Pulmonary and Critical Care Medicine Staff Physician, [place] Director - Interstitial Lung Disease  Program  Pulmonary [place] [facility] [place], [place], [address]  Pager: [phone], If no answer or between  15:00h - 7:00h: call [phone] Telephone: [phone]  10:33 AM 11/19/2020

## 2020-11-19 NOTE — Addendum Note (Signed)
Addended by: Lorretta Harp on: 11/19/2020 10:47 AM   Modules accepted: Orders

## 2020-11-19 NOTE — Patient Instructions (Addendum)
ICD-10-CM   1. History of interstitial lung disease  Z87.09   2. History of systemic lupus erythematosus (SLE) (HCC)  M32.9   3. Dyspnea on exertion  R06.00   4. Abnormal PFT  R94.2   5. Exercise hypoxemia  R09.02   6. Vaccine counseling  Z71.85   7. Immunosuppression due to drug therapy [facility])  [phone]    Z79.899     We need to establish the presence of interstitial lung disease and severity As discussed the feel of interstitial lung disease in the presence of lupus is changing a lot with new treatment now and also emerging research  Plan - check covid IgG 11/19/2020 - if not able you might qualify for monoclonal antibody prophylaxis against COVID -Do full pulmonary function test -Do high-resolution CT chest supine and prone -Do overnight oxygen study on room air -Take a prescription for portable oxygen 2 L nasal cannula -we will send it to DME company -Take ILD questionnaire version 3.0 packet and feels this up today or bring it back with you at next visit -Based on the above test results if there is significant amount of fibrosis he might qualify for medication called antifibrotic specifically nintedanib or pirfenidone -Can also address research as a clinical care option at follow-up - we d disussesd this briefly and appreciate your interst  Follow-up -Next few to several weeks but after completing all of the above tests

## 2020-11-24 ENCOUNTER — Other Ambulatory Visit: Payer: BC Managed Care – PPO

## 2020-12-01 ENCOUNTER — Other Ambulatory Visit: Payer: Self-pay

## 2020-12-01 ENCOUNTER — Ambulatory Visit (INDEPENDENT_AMBULATORY_CARE_PROVIDER_SITE_OTHER)
Admission: RE | Admit: 2020-12-01 | Discharge: 2020-12-01 | Disposition: A | Discharge status: Home / Self Care | Payer: BC Managed Care – PPO | Source: Ambulatory Visit | Attending: Internal Medicine | Admitting: Internal Medicine

## 2020-12-01 DIAGNOSIS — J849 Interstitial pulmonary disease, unspecified: Secondary | ICD-10-CM | POA: Diagnosis not present

## 2020-12-02 ENCOUNTER — Other Ambulatory Visit (HOSPITAL_COMMUNITY)
Admission: RE | Admit: 2020-12-02 | Discharge: 2020-12-02 | Disposition: A | Discharge status: Home / Self Care | Payer: BC Managed Care – PPO | Source: Ambulatory Visit | Attending: Internal Medicine | Admitting: Internal Medicine

## 2020-12-02 DIAGNOSIS — Z01812 Encounter for preprocedural laboratory examination: Secondary | ICD-10-CM | POA: Insufficient documentation

## 2020-12-02 DIAGNOSIS — Z20822 Contact with and (suspected) exposure to covid-19: Secondary | ICD-10-CM | POA: Insufficient documentation

## 2020-12-02 LAB — SARS CORONAVIRUS 2 (TAT 6-24 HRS): SARS Coronavirus 2: NEGATIVE

## 2020-12-05 ENCOUNTER — Ambulatory Visit (INDEPENDENT_AMBULATORY_CARE_PROVIDER_SITE_OTHER): Payer: BC Managed Care – PPO | Admitting: Internal Medicine

## 2020-12-05 ENCOUNTER — Other Ambulatory Visit: Payer: Self-pay

## 2020-12-05 DIAGNOSIS — Z8709 Personal history of other diseases of the respiratory system: Secondary | ICD-10-CM | POA: Diagnosis not present

## 2020-12-05 LAB — PULMONARY FUNCTION TEST
DL/VA % pred: 102 %
DL/VA: 4.38 ml/min/mmHg/L
DLCO unc % pred: 45 %
DLCO unc: 9.57 ml/min/mmHg
FEF 25-75 Post: 0.67 L/sec
FEF 25-75 Pre: 0.55 L/sec
FEF2575-%Change-Post: 20 %
FEF2575-%Pred-Post: 28 %
FEF2575-%Pred-Pre: 23 %
FEV1-%Change-Post: 5 %
FEV1-%Pred-Post: 39 %
FEV1-%Pred-Pre: 37 %
FEV1-Post: 0.88 L
FEV1-Pre: 0.84 L
FEV1FVC-%Change-Post: -3 %
FEV1FVC-%Pred-Pre: 89 %
FEV6-%Change-Post: 9 %
FEV6-%Pred-Post: 46 %
FEV6-%Pred-Pre: 42 %
FEV6-Post: 1.27 L
FEV6-Pre: 1.17 L
FEV6FVC-%Pred-Post: 103 %
FEV6FVC-%Pred-Pre: 103 %
FVC-%Change-Post: 9 %
FVC-%Pred-Post: 44 %
FVC-%Pred-Pre: 41 %
FVC-Post: 1.27 L
FVC-Pre: 1.17 L
Post FEV1/FVC ratio: 69 %
Post FEV6/FVC ratio: 100 %
Pre FEV1/FVC ratio: 72 %
Pre FEV6/FVC Ratio: 100 %
RV % pred: 98 %
RV: 1.82 L
TLC % pred: 64 %
TLC: 3.28 L

## 2020-12-05 NOTE — Progress Notes (Signed)
Full PFT performed today. °

## 2020-12-07 NOTE — Progress Notes (Signed)
Will discuss end of march 2022 visit. Will not call results in  xxx IMPRESSION: 1. Emphysema (ICD10-J43.9) with scattered pulmonary parenchymal scarring. No definitive evidence of superimposed interstitial lung disease. 2. Aortic atherosclerosis (ICD10-I70.0). Coronary artery calcification.   Electronically Signed   By: Lorin Picket M.D.   On: 12/01/2020 13:59

## 2020-12-09 ENCOUNTER — Telehealth: Payer: Self-pay | Admitting: Internal Medicine

## 2020-12-09 NOTE — Telephone Encounter (Signed)
lmtcb for pt.  

## 2020-12-09 NOTE — Telephone Encounter (Signed)
Order for o2 was sent to [facility], not [facility]  Will call the pt after 5:30 pm today or try her tomorrow

## 2020-12-09 NOTE — Telephone Encounter (Signed)
Patient is returning phone call. Will be available at 5:30 and after. Patient phone number is [phone].

## 2020-12-10 NOTE — Telephone Encounter (Signed)
This ONO order needs to go to the DME who supplies her O2.  It looks like both the ONO order & the  O2 order were placed on the same day & was worked by 2 different PCCs.  One sent the ONO to [facility] the other sent the O2 order to [facility].   I left a detailed message on the patient's VM to let her know that SHE will need to contact [facility] to cancel her appointment with them & that I will send the ONO order to [facility].  In addition, I will also send a CM to [facility] explaining the cancelled ONO order.

## 2020-12-10 NOTE — Telephone Encounter (Signed)
Message I sent to [facility] & their response:   It has been canceled the patient called this morning.                       Good Morning.   Will you please cancel this order? There was an O2 & an ONO order placed on the same day for this patient. I sent the ONO order to you & Chantel sent the O2 order to another DME. The other DME has supplied the pt with her O2, so the ONO will need to be performed by them. The patient has advised that she has an ONO scheduled with you & that appointment must be cancelled.    Thanks a bunch,  [employer]

## 2020-12-10 NOTE — Telephone Encounter (Signed)
Per Leah with [facility], they have rec'd the ONO order.  Nothing further needed at this time.

## 2020-12-10 NOTE — Telephone Encounter (Signed)
Spoke with the pt She states that she received call from [facility] to set up ONO and this has been scheduled  However, we sent order to [facility] for her oxygen  She just wanted to make sure this was okay I am not sure if this makes any difference or not, so forwarding to the PCC's in case any changes need to be made, thanks!

## 2020-12-12 ENCOUNTER — Other Ambulatory Visit (HOSPITAL_COMMUNITY): Payer: Self-pay | Admitting: *Deleted

## 2020-12-12 ENCOUNTER — Telehealth: Payer: Self-pay | Admitting: Internal Medicine

## 2020-12-12 MED ORDER — SILDENAFIL CITRATE 20 MG PO TABS: 20.0000 mg | ORAL_TABLET | Freq: Three times a day (TID) | ORAL | 11 refills | Status: DC

## 2020-12-12 NOTE — Telephone Encounter (Signed)
Order placed for pt to get overnight oximetry test done on 02/09.  Do you know when this will be scheduled for the pt?  thanks

## 2020-12-12 NOTE — Telephone Encounter (Signed)
Pt using [facility] as DME. Pt states ONO has not been done/delivered. Pt states she has received oxygen from [facility]. Pt needing to know if ONO needs to be done. Pt states the small tanks they brought out to go out, are too big to take out.Wanting smaller option.Please advise [phone]

## 2020-12-12 NOTE — Telephone Encounter (Signed)
Pt called back b/c she wants to know if MR thinks she actually still needs the ONO. Please advise.

## 2020-12-12 NOTE — Telephone Encounter (Signed)
Spoke with patient. She wanted to know if she still needed to do the ONO. I explained to her that the ONO to make sure she does not need O2 during the night since she is using it during the day. She verbalized understanding.   She also wanted to know why [facility] sent out tanks instead of the POC. She stated that the tanks are too heavy for her. I explained to her that [facility] will normally sent tanks to the patients to cover them until they are able to get a POC in stock for the patient. She verbalized understanding.   She also stated that [facility] told her that she will need to come to [facility] to get the ONO equipment, take it home, use it for one night and then take the equipment back to [place]. She does not drive and will have to arrange for transportation. I advised her that she should not have to do this and I would send a message to [facility] to see if she could be added to the delivery route. She verbalized understanding.   Community message sent to [place].

## 2020-12-15 ENCOUNTER — Other Ambulatory Visit (HOSPITAL_COMMUNITY): Payer: Self-pay | Admitting: *Deleted

## 2020-12-15 ENCOUNTER — Telehealth (HOSPITAL_COMMUNITY): Payer: Self-pay | Admitting: Pharmacy Technician

## 2020-12-15 MED ORDER — SILDENAFIL CITRATE 20 MG PO TABS: 20.0000 mg | ORAL_TABLET | Freq: Three times a day (TID) | ORAL | 3 refills | Status: DC

## 2020-12-15 NOTE — Telephone Encounter (Addendum)
Advanced Heart Failure Patient Advocate Encounter  Prior Authorization for Sildenafil has been approved.    PA# [id number] Effective dates: 12/15/20 through 12/15/21  Insurance is requiring the medication to be filled by [facility]. Jasmine, ([place]) sent the RX on behalf of the patient. Called and left the patient a message with the pharmacy information.   Charlann Boxer, CPhT

## 2020-12-15 NOTE — Telephone Encounter (Signed)
Patient Advocate Encounter   Received notification from [employer] that prior authorization for Sildenafil is required.   PA submitted on CoverMyMeds Key [id number] Status is pending   Will continue to follow.

## 2020-12-17 NOTE — Telephone Encounter (Signed)
Received a msg back from [place] with [employer] 12/17/20 :   I have emailed out logistics team and regional leader to see what we can do for this patient. When I get a reply I will do my best to keep you posted.   I am also adding Leah, the DME processor to this message.   Thank you,   Brad New   -------------------------------------------------------------------------------------  Will update the pt once we have further info

## 2020-12-17 NOTE — Telephone Encounter (Signed)
Checked community msgs in [employer] and do not see anything back on this pt. Will sent another msg to [place]. Will await response.

## 2020-12-22 ENCOUNTER — Telehealth: Payer: Self-pay | Admitting: Internal Medicine

## 2020-12-22 NOTE — Telephone Encounter (Signed)
This will be a qualifying walk that she will have to do in the office.  This can be placed on the six min walk schedule when the pt calls back please.  VM left for pt

## 2020-12-22 NOTE — Telephone Encounter (Signed)
pt is calling about oxgyen order that was sent to [facility], states that she told her she needed to get a 6 min walk done in order to get portable oxgyen, . Pt doesn't understand why she needs a 6 min walk now,  states if everything was set up right she wouldnt have to come back in office. Please advise   [phone]

## 2020-12-23 NOTE — Telephone Encounter (Signed)
No message as of yet from [facility]. Will follow back up.

## 2020-12-23 NOTE — Telephone Encounter (Signed)
Left message for patient to call back  

## 2020-12-23 NOTE — Telephone Encounter (Signed)
Patient has questions about overnight oximetry test for oxygen. Patient phone number is [phone].

## 2020-12-24 NOTE — Telephone Encounter (Signed)
LMTCB and will close per protocol. Walk can be done at upcoming appt with MR 01/08/21.

## 2020-12-24 NOTE — Telephone Encounter (Signed)
Still awaiting response from [employer].

## 2020-12-25 NOTE — Progress Notes (Addendum)
[facility] Note  12/29/2020     CHIEF COMPLAINT Patient presents for Retina Follow Up   HISTORY OF PRESENT ILLNESS: Katie [name] is a 55 y.o. female who presents to the clinic today for:   HPI    Retina Follow Up    Patient presents with  Other.  In both eyes.  This started months ago.  Severity is moderate.  Duration of 6 months.  Since onset it is stable.  I, the attending physician,  performed the HPI with the patient and updated documentation appropriately.          Comments    55 y/o female pt here for 6 mo f/u for plaquenil toxicity OU.  No change in [place] OU.  Denies pain, FOL, floaters.  Refresh prn OU.       Last edited by Bernarda Caffey, MD on 12/29/2020 12:51 PM. (History)    pt reports no change in vision  Referring physician: Hortencia Pilar, MD [place],  [place] [address]  HISTORICAL INFORMATION:   Selected notes from the MEDICAL RECORD NUMBER Referred by Dr. Quentin Ore for concern of plaquenil toxicity LEE:  Ocular Hx- PMH-   CURRENT MEDICATIONS: No current outpatient medications on file. (Ophthalmic Drugs)   No current facility-administered medications for this visit. (Ophthalmic Drugs)   Current Outpatient Medications (Other)  Medication Sig  . acetaminophen (TYLENOL) 325 MG tablet Take 650 mg by mouth. As directed as needed  . aspirin 81 MG EC tablet Take 81 mg by mouth daily.  Marland Kitchen azaTHIOprine (IMURAN) 50 MG tablet Take 50 mg by mouth daily.   . belimumab (BENLYSTA) 400 MG SOLR injection Inject 10 mg/kg into the vein every 30 (thirty) days.  . Calcium Carb-Cholecalciferol 600-200 MG-UNIT TABS 1 tablet with food  . Calcium Citrate-Vitamin D (CALCIUM + D PO) Take by mouth.  . Cyanocobalamin (B-12) 1000 MCG TABS 1 tablet  . furosemide (LASIX) 20 MG tablet TAKE 1 TABLET DAILY  . Magnesium 200 MG TABS 1 capsule with a meal  . magnesium oxide (MAG-OX) 400 MG tablet Take 400 mg by mouth daily.  .  metoprolol succinate (TOPROL-XL) 25 MG 24 hr tablet TAKE 1/2 TABLET DAILY  . Multiple Vitamin (MULTIVITAMIN) tablet Take 1 tablet by mouth daily.  . potassium chloride SA (KLOR-CON M20) 20 MEQ tablet Take 1 tablet (20 mEq total) by mouth 2 (two) times daily.  . predniSONE (DELTASONE) 5 MG tablet Take 5 mg by mouth daily.  . sildenafil (REVATIO) 20 MG tablet Take 1 tablet (20 mg total) by mouth 3 (three) times daily.  . valACYclovir (VALTREX) 1000 MG tablet Take 1,000 mg by mouth daily.  . Vitamin D, Cholecalciferol, 400 units CAPS Take by mouth.  . warfarin (COUMADIN) 5 MG tablet Take as directed by the anticoagulation clinic. (Patient taking differently: Take 5-7.5 mg by mouth See admin instructions. 7.5mg  on Sun,Mon,Wed, Fri, Sat 5mg  on Tues,Thursday)   No current facility-administered medications for this visit. (Other)      REVIEW OF SYSTEMS: ROS    Positive for: Musculoskeletal, Cardiovascular, Eyes   Negative for: Constitutional, Gastrointestinal, Neurological, Skin, Genitourinary, HENT, Endocrine, Respiratory, Psychiatric, Allergic/Imm, Heme/Lymph   Last edited by Matthew Folks, COA on 12/29/2020  9:36 AM. (History)       ALLERGIES Allergies  Allergen Reactions  . Cephalexin Other (See Comments)    HIVES  . Methotrexate     REACTION: intolerance  PAST MEDICAL HISTORY Past Medical History:  Diagnosis Date  . Abscess of right breast    sternal osteomyeliitis, pseudomonas  . C. difficile diarrhea 1/12  . CAD (coronary artery disease)    LHC (9/10): no angiographic CAD  . 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   . Cataract    Mixed OU  . CHF (congestive heart failure) (HCC)    secondary to mmoderate mitral stenosis and moderate mitral regurgitation in the setting of rheumatic vlave disease as well as severe pulmonary HTN likely due to a combination of MV disease and pulmoanary arterial HTN in the setting of  collagen vascular disease. Not valvuloplasty candidate due to MR. patient had MV replacement with mechanical MV in 8/11.  Marland Kitchen CHF (congestive heart failure) (HCC)    Echo post-op showed normally functioning mechanical MV, EF 60-65% with septal bounce, small mobile structure in the mid ventricle (? loose papillary muscle), RV-RA gradient 22 mmHg. Echo (10/11): EF 60-65%, normally functioning mechanical mitral vlavue.   Marland Kitchen Hypertensive retinopathy    OU  . Mixed connective tissue disease ([place])    diagnosis 1990. 8/10 ANA positive, anti-dDNA positive, anti-SCL70 negative  . 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 Pulmonary HTN ([place])    porbably secondary to combination of rheumatic MV disease and pulmonary arterial HYN form vollagen vascalr disease. CTA chest (7/10) with no evidence for pulmonary emboli or chronic pulmonary emboli. PA pressure 72/28 mean 50 with PVR 5.7 WU on initial RHC, PA pressure 45/21 mean 30 PVR 2.3 WU on f/u RHC on Revatio 80 mg three times a day (1/11).  After MV surgery, Revatio stopped.  . Pulmonary HTN ([place])    increased  dyspnea. Repeat RHC with mean RA 6, PA 42/20 mean 29, PVR 4.8 WU,  2.3. Revatio 20 mg  3x a day restarted. 3/11 6 min walk 440m. 2/12 6 min walk 427 m.   . Raynaud's syndrome   . Restrictive lung disease    secondary to MCTD. PFTs 03/06/99 VC 47% DLCO 26%. PFTs 09/12/08 VC 43% DLCO 45%. PFTs 6/10 FVC 37%, FEV1 34%, ratio 69%, TLC 50%. using home O2 periodically  . Rheumatic fever   . Warfarin anticoagulation    for mechanical MV, goal INR 2.5-3.5   Past Surgical History:  Procedure Laterality Date  . BREAST CYST ASPIRATION  03/09/2012  . CESAREAN SECTION    . COLONOSCOPY    . s/p mitral valvue replacement  05/13/2010   by Dr. Roxy Manns by minimally invasive window.   . TUBAL LIGATION      FAMILY HISTORY Family History  Problem Relation Age of Onset  . Emphysema Mother        smoker  . COPD Mother   . Heart disease Maternal Grandmother        side  of family not given  . Diabetes Maternal Grandmother   . Hypertension Father   . Osteoarthritis Father   . Sleep apnea Father   . Breast cancer Maternal Aunt     SOCIAL HISTORY Social History   Tobacco Use  . Smoking status: Never Smoker  . Smokeless tobacco: Never Used  Substance Use Topics  . Alcohol use: No  . Drug use: No         OPHTHALMIC EXAM:  Base Eye Exam    Visual Acuity (Snellen - Linear)      Right Left   Dist [place] 20/40 -2 20/30 -2  Dist ph  20/30 NI       Tonometry (Tonopen, 9:39 AM)      Right Left   Pressure 13 16       Pupils      Dark Light Shape React APD   Right 3 2 Round Brisk None   Left 3 2 Round Brisk None       Visual Fields (Counting fingers)      Left Right    Full Full       Extraocular Movement      Right Left    Full, Ortho Full, Ortho       Neuro/Psych    Oriented x3: Yes   Mood/Affect: Normal       Dilation    Both eyes: 1.0% Mydriacyl, 2.5% Phenylephrine @ 9:39 AM        Additional Tests    Color      Right Left   [place] 25/25 25/25        Slit Lamp and Fundus Exam    Slit Lamp Exam      Right Left   Lids/Lashes Dermatochalasis - upper lid, mild Meibomian gland dysfunction Dermatochalasis - upper lid, mild Meibomian gland dysfunction   Conjunctiva/Sclera trace melanosis White and quiet   Cornea Trace Punctate epithelial erosions, trace tear film debris Trace Punctate epithelial erosions, trace tear film debris   Anterior Chamber Deep and clear, narrow temporal angle, No cell or flare Deep and clear, narrow temporal angle, No cell or flare   Iris Round and dilated Round and dilated   Lens 2+ Nuclear sclerosis, 2-3+ Cortical cataract, 2+ Posterior subcapsular cataract 2+ Nuclear sclerosis, 2+ Cortical cataract, 1-2+ Posterior subcapsular cataract   Vitreous Vitreous syneresis Vitreous syneresis       Fundus Exam      Right Left   Disc Pink and Sharp, +cupping Pink and Sharp, +cupping, mild PPP   C/D  Ratio 0.7 0.7   Macula Flat, Good foveal reflex, RPE mottling and clumping, +small central cyst, No heme Flat, Good foveal reflex, RPE mottling and clumping, trace cystic changes centrally, No heme   Vessels mild attenuation, mild Copper wiring mild attenuation, mild Copper wiring   Periphery Attached, No heme  Attached, no heme             IMAGING AND PROCEDURES  Imaging and Procedures for @TODAY @  OCT, Retina - OU - Both Eyes       Right Eye Quality was good. Central Foveal Thickness: 254. Progression has worsened. Findings include abnormal foveal contour, no SRF, intraretinal fluid, vitreous traction (mild progression of VMT, interval development of small macular cyst).   Left Eye Quality was good. Central Foveal Thickness: 244. Progression has worsened. Findings include abnormal foveal contour, no SRF, vitreous traction, intraretinal fluid (Mild interval increase in central cystic changes (VMT related)).   Notes *Images captured and stored on drive  Diagnosis / Impression:  OD: mild progression of VMT, interval development of small macular cyst OS: Mild interval increase in central cystic changes (VMT related)  Clinical management:  See below  Abbreviations: NFP - Normal foveal profile. CME - cystoid macular edema. PED - pigment epithelial detachment. IRF - intraretinal fluid. SRF - subretinal fluid. EZ - ellipsoid zone. ERM - epiretinal membrane. ORA - outer retinal atrophy. ORT - outer retinal tubulation. SRHM - subretinal hyper-reflective material                 ASSESSMENT/PLAN:    ICD-10-CM  1. 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 Retinal edema  H35.81 OCT, Retina - OU - Both Eyes  3. Long-term use of Plaquenil  Z79.899   4. Essential hypertension  I10   5. Hypertensive retinopathy of both eyes  H35.033   6. Anatomical narrow angle glaucoma  H40.039   7. Combined forms of age-related cataract of both eyes  H25.813    1,2. VMT OU  - OCT  shows interval increase in cystic changes from VMT (OD > OS)  - pt denies significant vision changes or metamorphopsia  - BCVA OD 20/30 from 20/25; OS stable at 20/30  - discussed findings, prognosis, including possible development of macular hole  - given amsler grid to monitor vision  - f/u 6 months, sooner prn -- DFE, OCT  3. 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 Hypertensive retinopathy OU  - discussed importance of tight BP control  - monitor  6. Narrow angle glaucoma OU  - IOP today: 13,16  - under the expert management of Dr. Kathlen Mody  7. Mixed cataracts OU  - The symptoms of cataract, surgical options, and treatments and risks were discussed with patient.   - under the expert management of Dr. Kathlen Mody   Ophthalmic Meds Ordered this visit:  No orders of the defined types were placed in this encounter.      Return in about 6 months (around 07/01/2021) for f/u VMT OU, DFE, OCT.  There are no Patient Instructions on file for this visit.  This document serves as a record of services personally performed by Gardiner Sleeper, MD, PhD. It was created on their behalf by Leeann Must, [place], an ophthalmic technician. The creation of this record is the provider's dictation and/or activities during the visit.    Electronically signed by: Leeann Must, COA 03.17.2022 12:59 PM   This document serves as a record of services personally performed by Gardiner Sleeper, MD, PhD. It was  created on their behalf by San Jetty. Owens Shark, OA an ophthalmic technician. The creation of this record is the provider's dictation and/or activities during the visit.    Electronically signed by: San Jetty. Owens Shark, [place] 03.21.2022 12:59 PM  Gardiner Sleeper, M.D., Ph.D. Diseases & Surgery of the Retina and [place] 12/29/2020   I have reviewed the above documentation for accuracy and completeness, and I agree with the above. Gardiner Sleeper, M.D., Ph.D. 12/29/20 12:59 PM   Abbreviations: M myopia (nearsighted); A astigmatism; H hyperopia (farsighted); P presbyopia; Mrx spectacle prescription;  CTL contact lenses; OD right eye; OS left eye; OU both eyes  XT exotropia; ET esotropia; PEK punctate epithelial keratitis; PEE punctate epithelial erosions; DES dry eye syndrome; MGD meibomian gland dysfunction; ATs artificial tears; PFAT's preservative free artificial tears; [place] nuclear sclerotic cataract; PSC posterior subcapsular cataract; ERM epi-retinal membrane; PVD posterior vitreous detachment; RD retinal detachment; DM diabetes mellitus; DR diabetic retinopathy; NPDR non-proliferative  diabetic retinopathy; PDR proliferative diabetic retinopathy; CSME clinically significant macular edema; DME diabetic macular edema; dbh dot blot hemorrhages; CWS cotton wool spot; POAG primary open angle glaucoma; C/D cup-to-disc ratio; HVF humphrey visual field; GVF goldmann visual field; OCT optical coherence tomography; IOP intraocular pressure; BRVO Branch retinal vein occlusion; CRVO central retinal vein occlusion; CRAO central retinal artery occlusion; BRAO branch retinal artery occlusion; RT retinal tear; SB scleral buckle; PPV pars plana vitrectomy; VH Vitreous hemorrhage; PRP panretinal laser photocoagulation; IVK intravitreal kenalog; VMT vitreomacular traction; MH Macular hole;  NVD neovascularization of the disc; NVE neovascularization elsewhere; AREDS age related eye disease study;  ARMD age related macular degeneration; POAG primary open angle glaucoma; EBMD epithelial/anterior basement membrane dystrophy; ACIOL anterior chamber intraocular lens; IOL intraocular lens; PCIOL posterior chamber intraocular lens; Phaco/IOL phacoemulsification with intraocular lens placement; [place] photorefractive keratectomy; LASIK laser assisted in situ keratomileusis; HTN hypertension; DM diabetes mellitus; COPD chronic obstructive pulmonary disease

## 2020-12-29 ENCOUNTER — Other Ambulatory Visit: Payer: Self-pay

## 2020-12-29 ENCOUNTER — Ambulatory Visit (INDEPENDENT_AMBULATORY_CARE_PROVIDER_SITE_OTHER): Payer: BC Managed Care – PPO | Admitting: Ophthalmology

## 2020-12-29 ENCOUNTER — Encounter (INDEPENDENT_AMBULATORY_CARE_PROVIDER_SITE_OTHER): Payer: Self-pay | Admitting: Ophthalmology

## 2020-12-29 DIAGNOSIS — Z79899 Other long term (current) drug therapy: Secondary | ICD-10-CM | POA: Diagnosis not present

## 2020-12-29 DIAGNOSIS — H43823 Vitreomacular adhesion, bilateral: Secondary | ICD-10-CM

## 2020-12-29 DIAGNOSIS — I10 Essential (primary) hypertension: Secondary | ICD-10-CM

## 2020-12-29 DIAGNOSIS — H35033 Hypertensive retinopathy, bilateral: Secondary | ICD-10-CM

## 2020-12-29 DIAGNOSIS — H40039 Anatomical narrow angle, unspecified eye: Secondary | ICD-10-CM

## 2020-12-29 DIAGNOSIS — H3581 Retinal edema: Secondary | ICD-10-CM | POA: Diagnosis not present

## 2020-12-29 DIAGNOSIS — H25813 Combined forms of age-related cataract, bilateral: Secondary | ICD-10-CM

## 2021-01-07 ENCOUNTER — Telehealth (HOSPITAL_COMMUNITY): Payer: Self-pay | Admitting: Cardiology

## 2021-01-07 NOTE — Telephone Encounter (Signed)
Pt left VM on triage line with concerns about sildenafil Returned call no answer [facility]

## 2021-01-07 NOTE — Telephone Encounter (Signed)
Never received further response from [facility]  I see that pt is scheduled for appt tomorrow 01/08/21  Not sure if this ONO ever got done, looks like the order for it was actually sent to [facility] for the pt to see if they contacted her yet.

## 2021-01-07 NOTE — Telephone Encounter (Signed)
Pt called to report sildenafil co pay is $300/90 days at [facility] Was told if script sent to [facility] ([place]) $15/90 days   Advised would forward to pharmacy to review

## 2021-01-08 ENCOUNTER — Ambulatory Visit: Payer: BC Managed Care – PPO | Admitting: Internal Medicine

## 2021-01-09 MED ORDER — SILDENAFIL CITRATE 20 MG PO TABS: 20.0000 mg | ORAL_TABLET | Freq: Three times a day (TID) | ORAL | 3 refills | Status: DC

## 2021-01-09 NOTE — Telephone Encounter (Signed)
Pt aware  -script sent to [facility]

## 2021-01-20 ENCOUNTER — Institutional Professional Consult (permissible substitution): Payer: BC Managed Care – PPO | Admitting: Neurology

## 2021-01-27 ENCOUNTER — Encounter: Payer: Self-pay | Admitting: Neurology

## 2021-01-27 ENCOUNTER — Ambulatory Visit: Payer: BC Managed Care – PPO | Admitting: Neurology

## 2021-01-27 VITALS — BP 109/78 | HR 84 | Ht 64.75 in | Wt 186.0 lb

## 2021-01-27 DIAGNOSIS — J8410 Pulmonary fibrosis, unspecified: Secondary | ICD-10-CM

## 2021-01-27 DIAGNOSIS — F5103 Paradoxical insomnia: Secondary | ICD-10-CM | POA: Diagnosis not present

## 2021-01-27 DIAGNOSIS — R42 Dizziness and giddiness: Secondary | ICD-10-CM

## 2021-01-27 DIAGNOSIS — I7300 Raynaud's syndrome without gangrene: Secondary | ICD-10-CM

## 2021-01-27 DIAGNOSIS — I270 Primary pulmonary hypertension: Secondary | ICD-10-CM

## 2021-01-27 DIAGNOSIS — I5032 Chronic diastolic (congestive) heart failure: Secondary | ICD-10-CM

## 2021-01-27 MED ORDER — ALPRAZOLAM 0.25 MG PO TABS: ORAL_TABLET | ORAL | 0 refills | Status: DC

## 2021-01-27 NOTE — Patient Instructions (Signed)
Orthostatic Hypotension Blood pressure is a measurement of how strongly, or weakly, your blood is pressing against the walls of your arteries. Orthostatic hypotension is a sudden drop in blood pressure that happens when you quickly change positions, such as when you get up from sitting or lying down. Arteries are blood vessels that carry blood from your heart throughout your body. When blood pressure is too low, you may not get enough blood to your brain or to the rest of your organs. This can cause weakness, light-headedness, rapid heartbeat, and fainting. This can last for just a few seconds or for up to a few minutes. Orthostatic hypotension is usually not a serious problem. However, if it happens frequently or gets worse, it may be a sign of something more serious. What are the causes? This condition may be caused by:  Sudden changes in posture, such as standing up quickly after you have been sitting or lying down.  Blood loss.  Loss of body fluids (dehydration).  Heart problems.  Hormone (endocrine) problems.  Pregnancy.  Severe infection.  Lack of certain nutrients.  Severe allergic reactions (anaphylaxis).  Certain medicines, such as blood pressure medicine or medicines that make the body lose excess fluids (diuretics). Sometimes, this condition can be caused by not taking medicine as directed, such as taking too much of a certain medicine. What increases the risk? The following factors may make you more likely to develop this condition:  Age. Risk increases as you get older.  Conditions that affect the heart or the central nervous system.  Taking certain medicines, such as blood pressure medicine or diuretics.  Being pregnant. What are the signs or symptoms? Symptoms of this condition may include:  Weakness.  Light-headedness.  Dizziness.  Blurred vision.  Fatigue.  Rapid heartbeat.  Fainting, in severe cases. How is this diagnosed? This condition is  diagnosed based on:  Your medical history.  Your symptoms.  Your blood pressure measurement. Your health care provider will check your blood pressure when you are: ? Lying down. ? Sitting. ? Standing. A blood pressure reading is recorded as two numbers, such as "120 over 80" (or 120/80). The first ("top") number is called the systolic pressure. It is a measure of the pressure in your arteries as your heart beats. The second ("bottom") number is called the diastolic pressure. It is a measure of the pressure in your arteries when your heart relaxes between beats. Blood pressure is measured in a unit called mm Hg. Healthy blood pressure for most adults is 120/80. If your blood pressure is below 90/60, you may be diagnosed with hypotension. Other information or tests that may be used to diagnose orthostatic hypotension include:  Your other vital signs, such as your heart rate and temperature.  Blood tests.  Tilt table test. For this test, you will be safely secured to a table that moves you from a lying position to an upright position. Your heart rhythm and blood pressure will be monitored during the test. How is this treated? This condition may be treated by:  Changing your diet. This may involve eating more salt (sodium) or drinking more water.  Taking medicines to raise your blood pressure.  Changing the dosage of certain medicines you are taking that might be lowering your blood pressure.  Wearing compression stockings. These stockings help to prevent blood clots and reduce swelling in your legs. In some cases, you may need to go to the hospital for:  Fluid replacement. This means you will   receive fluids through an IV.  Blood replacement. This means you will receive donated blood through an IV (transfusion).  Treating an infection or heart problems, if this applies.  Monitoring. You may need to be monitored while medicines that you are taking wear off. Follow these instructions  at home: Eating and drinking  Drink enough fluid to keep your urine pale yellow.  Eat a healthy diet, and follow instructions from your health care provider about eating or drinking restrictions. A healthy diet includes: ? Fresh fruits and vegetables. ? Whole grains. ? Lean meats. ? Low-fat dairy products.  Eat extra salt only as directed. Do not add extra salt to your diet unless your health care provider told you to do that.  Eat frequent, small meals.  Avoid standing up suddenly after eating.   Medicines  Take over-the-counter and prescription medicines only as told by your health care provider. ? Follow instructions from your health care provider about changing the dosage of your current medicines, if this applies. ? Do not stop or adjust any of your medicines on your own. General instructions  Wear compression stockings as told by your health care provider.  Get up slowly from lying down or sitting positions. This gives your blood pressure a chance to adjust.  Avoid hot showers and excessive heat as directed by your health care provider.  Return to your normal activities as told by your health care provider. Ask your health care provider what activities are safe for you.  Do not use any products that contain nicotine or tobacco, such as cigarettes, e-cigarettes, and chewing tobacco. If you need help quitting, ask your health care provider.  Keep all follow-up visits as told by your health care provider. This is important.   Contact a health care provider if you:  Vomit.  Have diarrhea.  Have a fever for more than 2-3 days.  Feel more thirsty than usual.  Feel weak and tired. Get help right away if you:  Have chest pain.  Have a fast or irregular heartbeat.  Develop numbness in any part of your body.  Cannot move your arms or your legs.  Have trouble speaking.  Become sweaty or feel light-headed.  Faint.  Feel short of breath.  Have trouble staying  awake.  Feel confused. Summary  Orthostatic hypotension is a sudden drop in blood pressure that happens when you quickly change positions.  Orthostatic hypotension is usually not a serious problem.  It is diagnosed by having your blood pressure taken lying down, sitting, and then standing.  It may be treated by changing your diet or adjusting your medicines. This information is not intended to replace advice given to you by your health care provider. Make sure you discuss any questions you have with your health care provider. Document Revised: 03/23/2018 Document Reviewed: 03/23/2018 Elsevier Patient Education  2021 [employer]. Dizziness Dizziness is a common problem. It is a feeling of unsteadiness or light-headedness. You may feel like you are about to faint. Dizziness can lead to injury if you stumble or fall. Anyone can become dizzy, but dizziness is more common in older adults. This condition can be caused by a number of things, including medicines, dehydration, or illness. Follow these instructions at home: Eating and drinking  Drink enough fluid to keep your urine clear or pale yellow. This helps to keep you from becoming dehydrated. Try to drink more clear fluids, such as water.  Do not drink alcohol.  Limit your caffeine intake if  told to do so by your health care provider. Check ingredients and nutrition facts to see if a food or beverage contains caffeine.  Limit your salt (sodium) intake if told to do so by your health care provider. Check ingredients and nutrition facts to see if a food or beverage contains sodium. Activity  Avoid making quick movements. ? Rise slowly from chairs and steady yourself until you feel okay. ? In the morning, first sit up on the side of the bed. When you feel okay, stand slowly while you hold onto something until you know that your balance is fine.  If you need to stand in one place for a long time, move your legs often. Tighten and relax  the muscles in your legs while you are standing.  Do not drive or use heavy machinery if you feel dizzy.  Avoid bending down if you feel dizzy. Place items in your home so that they are easy for you to reach without leaning over. Lifestyle  Do not use any products that contain nicotine or tobacco, such as cigarettes and e-cigarettes. If you need help quitting, ask your health care provider.  Try to reduce your stress level by using methods such as yoga or meditation. Talk with your health care provider if you need help to manage your stress. General instructions  Watch your dizziness for any changes.  Take over-the-counter and prescription medicines only as told by your health care provider. Talk with your health care provider if you think that your dizziness is caused by a medicine that you are taking.  Tell a friend or a family member that you are feeling dizzy. If he or she notices any changes in your behavior, have this person call your health care provider.  Keep all follow-up visits as told by your health care provider. This is important. Contact a health care provider if:  Your dizziness does not go away.  Your dizziness or light-headedness gets worse.  You feel nauseous.  You have reduced hearing.  You have new symptoms.  You are unsteady on your feet or you feel like the room is spinning. Get help right away if:  You vomit or have diarrhea and are unable to eat or drink anything.  You have problems talking, walking, swallowing, or using your arms, hands, or legs.  You feel generally weak.  You are not thinking clearly or you have trouble forming sentences. It may take a friend or family member to notice this.  You have chest pain, abdominal pain, shortness of breath, or sweating.  Your vision changes.  You have any bleeding.  You have a severe headache.  You have neck pain or a stiff neck.  You have a fever. These symptoms may represent a serious problem  that is an emergency. Do not wait to see if the symptoms will go away. Get medical help right away. Call your local emergency services (911 in the [place] Do not drive yourself to the hospital. Summary  Dizziness is a feeling of unsteadiness or light-headedness. This condition can be caused by a number of things, including medicines, dehydration, or illness.  Anyone can become dizzy, but dizziness is more common in older adults.  Drink enough fluid to keep your urine clear or pale yellow. Do not drink alcohol.  Avoid making quick movements if you feel dizzy. Monitor your dizziness for any changes. This information is not intended to replace advice given to you by your health care provider. Make sure you discuss  any questions you have with your health care provider. Document Revised: 09/30/2017 Document Reviewed: 10/30/2016 Elsevier Patient Education  2021 [employer].

## 2021-01-27 NOTE — Progress Notes (Signed)
SLEEP MEDICINE CLINIC   Provider:  Larey Seat, M D  Referring Provider: Deland Pretty, MD Primary Care Physician:  Deland Pretty, MD  Chief Complaint  Patient presents with  . New Patient (Initial Visit)    Pt alone, rm 10. Presents today for recurrent dizziness. States that she had this concern in 2019 but it has worsened. She wakes up daily with a headache and indicates that she can feel when a dizzy spell may occur. States that happens when she is driving but states it doesn't affect her vision. She is a school bus driver. She has completed work up with ENT and they ruled out vertigo. She did c/o a SS here in 2017. 2.5 wks ago she all of a sudden had a sharp headache pain in the left temporal area    HPI:  Katie [name] is a 55 y.o. female , now re- referred by Dr Shelia Media. Dizziness and sharp headaches.  The patient was originally seen here for recurrent dizziness and insomnia she had completed a work-up with ENT for vertigo which was ruled out, she had a sleep study with [place] 5 years ago.  In 2019 she had a concern about sharp and sudden onset of headaches which have resolved.  She has pulmonary hypertension and she states pulmonary fibrosis followed by Dr. Chase Caller her congestive heart failure has been followed by Dr. Daisy Floro, she still has trouble with paradoxical insomnia.with positive ANA N, mixed connective tissue disorder, history of mitral valve replacement, paroxysmal spells, Raynaud's syndrome, discoid lupus, restrictive lung disease nephrotic syndrome and systolic congestive heart failure.  She had been diagnosed with pulmonary hypertension and coronary artery disease as well as with peri-cardial effusion.    She has a recurrent sharp headache today, started again just weeks ago,  dizziness reported again.  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    She had a sleep study in 2017, none since.  At the time of her first sleep study I recommended for PCP a referral to a behavior  or cognitive psychologist to help the patient learn and implement better sleep habits. She was never referred, she stated. She reports the same problems with insomnia, she did not fill out Epworth score ,nor FSS.  She believes not to snore. 2-3 nocturias each night. Bed time is variable.  She tries for 9 PM to start getting ready for bed.  The earlier she gets to bed, the earlier she wakes up.  All started with prednisone therapy- she is still taking prednisone. She is mother of a 6th grader, and reports her son is doing well.    12-06-2017 I had the pleasure of meeting Katie [name] about 18 months ago when she was referred by her ENT specialist Dr. Erik Obey and her primary care physician Dr. Trilby Drummer.  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  She had been diagnosed with pulmonary hypertension and coronary artery disease as well as with peri-cardial effusion.  She was not excessively sleepy in daytime but felt that her extremities were rather to fall asleep and stay asleep. The patient is also mother of an 41-year-old son, and his main caretaker.  At the time of her first sleep study I recommended for PCP a referral to  a behavior or cognitive psychologist to help the patient learn and implement better sleep habits. She was never referred, she stated.  She reports the same problems with insomnia, she did not fill out Epworth score ,nor FSS.  She believes not to snore. 2-3 nocturias each night. Bed time is variable.  She tries for 9 PM to start getting ready for bed.  The earlier she gets to bed, the earlier she wakes up.  All started with prednisone therapy.  I reviewed her sleep study from 20 May 2016, which has  revealed only an AHI of 2.9/h no additional respiratory disturbances, REM AHI was 17.9, respiratory rate was 19/min no evidence of CO2 retention, no overall increased total time in desaturation.  Snoring was very mild.   There were no significant periodic limb movements of sleep noted.   EKG was a normal sinus rhythm, occasional for the non-restorative nature of the patient's sleep could not be identified.   Her sleep was fragmented as we could see in the sleep lab with a sleep efficiency of only 67%,  took a 42 minutes to go to sleep and during the sleep time she woke up many times.  Her spells  were clearly non-vertigenous, and we ruled out TIAs or seizures. She needs to cut out caffeine and increase oral hydration.      05-2016 , CD consult Previously seen here as a referral from Jodi Marble, MD, who referred this patient because of excessive daytime fatigue and dizziness feeling tired all the time. The patient has an extensive past medical history she underwent a mitral valve replacement and developed a cardiac tamponade, she carries a diagnosis of congestive heart failure, pulmonary hypertension due to high resistance, Raynaud's syndrome, systemic lupus erythematosus with pulmonary lupus, peripheral artery obstruction disease, vascular necrosis of the bones, she was referred by Dr. Thressa Sheller her primary care physician to Dr. Erik Obey. She begun feeling in May 2017 episodes of sudden onset of dizziness, described as a lightheadedness and followed by a sudden feeling of severe tiredness and fatigue. She could just fall asleep she felt so fatigued and let, heavy without any strength left in her. The episodes lasted less than 30 seconds that could happen several time each day. 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 She has no history of traumatic brain injury in also throat trauma or previous strokes. There is no vestibulitis present or symptom of  vestibulitis she never had a spinning sensation or the feeling of being in motion and actually at rest. The spells only happen when she drives. Mornings and afternoons. Unrelated to food intake times or sleep.  Insomnia since 1990 , ever since she started on prednisone for Lupus.  Sleep habits are as follows:She usually goes to bed between 10:30 and 11 PM she lives with her son. After she goes to bed it takes her about 90 minutes 220 minutes to go to sleep. She keeps her bedroom cool, quiet and dark, she sleeps on 2 pillows she usually prefers to sleep prone. She does not suffer shortness of breath at night, palpitations have never woken her from her sleep is fragmented at least twice at night when she has the urge to urinate since she is on diuretics. It is very difficult for her to go back to sleep after these interruptions. Her total sleep time may be as little as 4 hours at night. There is no witness to her  sleep pattern and her son cannot  state if she snores or if she has apnea. She frequently wakes up with headaches but the headaches do not wake her, they do not have an episodic cluster character. She has to arise at 5 former work as a Teacher, early years/pre but she usually is awake long before that. She does not wake up with a dry mouth but has headaches, she has not woken up with unexplained injuries  Or bruises, incontinence or tongue bite. She recalls dreaming, vividly.   Sleep medical history and family sleep history:  She has no history of sleepwalking, enuresis, night terrors.  Social history: Mrs. Schmiesing is single, she is a 41-year-old son and works as a Teacher, early years/pre. She was diagnosed at age 75 with lupus. Her lupus did not flareup in her late pregnancy. 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   Review of Systems: Out of a complete 14 system review, the patient complains of only the following symptoms, and all other reviewed systems are negative.   hoarse,   Coughing, cold, Insomnia, sleep deprivation, School bus driver, morning headaches, discoid lupus,  pulmonary hypertension, dizzy spells.   In 05-2016 -Epworth score 7 , Fatigue severity score 63  , depression score 2/15    Social History   Socioeconomic History  . Marital status: Single    Spouse name: Not on file  . Number of children: Not on file  . Years of education: Not on file  . Highest education level: Not on file  Occupational History  . Not on file  Tobacco Use  . Smoking status: Never Smoker  . Smokeless tobacco: Never Used  Substance and Sexual Activity  . Alcohol use: No  . Drug use: No  . Sexual activity: Not Currently    Birth control/protection: Other-see comments    Comment: BTL  Other Topics Concern  . 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    Social Determinants of Health   Financial Resource Strain: Not on file  Food Insecurity: Not on file  Transportation Needs: Not on file  Physical Activity: Not on file  Stress: Not on file  Social Connections: Not on file  Intimate Partner Violence: Not on file    Family History  Problem Relation Age of Onset  . Emphysema Mother        smoker  . COPD Mother   . Heart disease Maternal Grandmother        side of family not given  . Diabetes Maternal Grandmother   . Hypertension Father   . Osteoarthritis Father   . Sleep apnea Father   . Breast cancer Maternal Aunt     Past Medical History:  Diagnosis Date  . Abscess of right breast    sternal osteomyeliitis, pseudomonas  . C. difficile diarrhea 1/12  . CAD (coronary artery disease)    LHC (9/10): no angiographic CAD  . 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   . Cataract    Mixed OU  . CHF (congestive heart failure) (HCC)    secondary to mmoderate mitral stenosis and moderate mitral regurgitation in the setting of rheumatic vlave  disease as well as severe pulmonary HTN likely due to a combination of MV disease and pulmoanary arterial HTN in the setting of collagen vascular disease. Not valvuloplasty candidate due to MR. patient had MV  replacement with mechanical MV in 8/11.  Marland Kitchen CHF (congestive heart failure) (HCC)    Echo post-op showed normally functioning mechanical MV, EF 60-65% with septal bounce, small mobile structure in the mid ventricle (? loose papillary muscle), RV-RA gradient 22 mmHg. Echo (10/11): EF 60-65%, normally functioning mechanical mitral vlavue.   Marland Kitchen Hypertensive retinopathy    OU  . Mixed connective tissue disease ([place])    diagnosis 1990. 8/10 ANA positive, anti-dDNA positive, anti-SCL70 negative  . 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 Pulmonary HTN ([place])    porbably secondary to combination of rheumatic MV disease and pulmonary arterial HYN form vollagen vascalr disease. CTA chest (7/10) with no evidence for pulmonary emboli or chronic pulmonary emboli. PA pressure 72/28 mean 50 with PVR 5.7 WU on initial RHC, PA pressure 45/21 mean 30 PVR 2.3 WU on f/u RHC on Revatio 80 mg three times a day (1/11).  After MV surgery, Revatio stopped.  . Pulmonary HTN ([place])    increased  dyspnea. Repeat RHC with mean RA 6, PA 42/20 mean 29, PVR 4.8 WU,  2.3. Revatio 20 mg  3x a day restarted. 3/11 6 min walk 4107m. 2/12 6 min walk 427 m.   . Raynaud's syndrome   . Restrictive lung disease    secondary to MCTD. PFTs 03/06/99 VC 47% DLCO 26%. PFTs 09/12/08 VC 43% DLCO 45%. PFTs 6/10 FVC 37%, FEV1 34%, ratio 69%, TLC 50%. using home O2 periodically  . Rheumatic fever   . Warfarin anticoagulation    for mechanical MV, goal INR 2.5-3.5    Past Surgical History:  Procedure Laterality Date  . BREAST CYST ASPIRATION  03/09/2012  . CESAREAN SECTION    . COLONOSCOPY    . s/p mitral valvue replacement  05/13/2010   by Dr. Roxy Manns by minimally invasive window.   . TUBAL LIGATION      Current Outpatient Medications   Medication Sig Dispense Refill  . acetaminophen (TYLENOL) 325 MG tablet Take 650 mg by mouth. As directed as needed    . aspirin 81 MG EC tablet Take 81 mg by mouth daily.    Marland Kitchen azaTHIOprine (IMURAN) 50 MG tablet Take 50 mg by mouth daily.     . belimumab (BENLYSTA) 400 MG SOLR injection Inject 10 mg/kg into the vein every 30 (thirty) days.    . Calcium Carb-Cholecalciferol 600-200 MG-UNIT TABS 1 tablet with food    . Calcium Citrate-Vitamin D (CALCIUM + D PO) Take by mouth.    . Cyanocobalamin (B-12) 1000 MCG TABS 1 tablet    . furosemide (LASIX) 20 MG tablet TAKE 1 TABLET DAILY 90 tablet 3  . Magnesium 200 MG TABS 1 capsule with a meal    . magnesium oxide (MAG-OX) 400 MG tablet Take 400 mg by mouth daily.    . metoprolol succinate (TOPROL-XL) 25 MG 24 hr tablet TAKE 1/2 TABLET DAILY 45 tablet 3  . Multiple Vitamin (MULTIVITAMIN) tablet Take 1 tablet by mouth daily.    . potassium chloride SA (KLOR-CON M20) 20 MEQ tablet Take 1 tablet (20 mEq total) by mouth 2 (two) times daily. 180 tablet 3  . predniSONE (DELTASONE) 5 MG tablet Take 5 mg by mouth daily.    . sildenafil (REVATIO) 20 MG tablet Take 1 tablet (20 mg total) by mouth 3 (three) times daily. 270 tablet 3  . valACYclovir (VALTREX) 1000 MG tablet Take 1,000 mg by mouth daily.    . Vitamin D, Cholecalciferol, 400 units  CAPS Take by mouth.    . warfarin (COUMADIN) 5 MG tablet Take as directed by the anticoagulation clinic. (Patient taking differently: Take 5-7.5 mg by mouth See admin instructions. 7.5mg  on Sun,Mon,Wed, Fri, Sat 5mg  on Tues,Thursday) 135 tablet 3   No current facility-administered medications for this visit.    Allergies as of 01/27/2021 - Review Complete 01/27/2021  Allergen Reaction Noted  . Cephalexin Other (See Comments) 04/22/2016  . Methotrexate      Vitals: BP 109/78   Pulse 84   Ht 5' 4.75" (1.645 m)   Wt 186 lb (84.4 kg)   BMI 31.19 kg/m  Last Weight:  Wt Readings from Last 1 Encounters:   01/27/21 186 lb (84.4 kg)   VWU:JWJX mass index is 31.19 kg/m.     Last Height:   Ht Readings from Last 1 Encounters:  01/27/21 5' 4.75" (1.645 m)    Physical exam:  General: The patient is awake, alert and appears not in acute distress. The patient is well groomed. Head: Normocephalic, atraumatic. Neck is supple. Mallampati 2,  neck circumference: 13.25.  Nasal airflow patent , TMJ is is  evident , restricted mouth opening.Retrognathia is not seen. No biological teeth.    Cardiovascular:  Regular rate and rhythm, without  murmurs or carotid bruit, and without distended neck veins. Respiratory: Lungs are clear to auscultation. Skin:  Without evidence of edema, or rash- facial scarring form lupus discoides Trunk:  The patient's posture is erect    Neurologic exam : The patient is awake and alert, oriented to place and time.   Attention span & concentration ability appears normal.  Speech is fluent,  with dysphonia.Her upper lip is stiffened by lupoid skin changes  Mood and affect are appropriate.  Cranial nerves: Taste and smell are preserved. Pupils are equal and reactive to light. She has strabismus, but reports no diplopia. Reports no history of lazy eye.  She is bothered by bright lights, has bilaterally cataracts"  Extraocular movements in vertical and horizontal planes intact , without nystagmus. Visual fields by finger perimetry are intact. Hearing to finger rub intact. Facial sensation intact to fine touch.Facial motor strength is symmetric -and tongue and uvula move midline. Shoulder shrug was symmetrical.   Motor exam:  Normal tone, muscle bulk and symmetric strength in all extremities. She has reduced grip strength, trouble to open a jar and no changes in handwriting.  Sensory:  Fine touch and vibration were tested as normal. Coordination: Finger-to-nose maneuver normal without evidence of ataxia, dysmetria or tremor. Gait and station: Patient walks without assistive  device .  Strength within normal limits. Stance is stable and normal.   Deep tendon reflexes: in the upper and lower extremities are symmetric and intact.   The patient was advised of the nature of the diagnosed sleep disorder , the treatment options and risks for general a health and wellness arising from not treating the condition.  I spent more than 35 minutes of face to face time with the patient and confirming her history making necessary changes.  Greater than 50% of time was spent in counseling and coordination of care. We have discussed the diagnosis and differential and I answered the patient's questions.   Mrs. Balbuena spells could indeed be related to sleep deprivation . Her attended sleep study has not shown any significant organic sleep disorder-    Assessment:  After physical and neurologic examination, review of laboratory studies,  Personal review of imaging studies, reports of other [facility]  Imaging studies ,  Results of polysomnography/ neurophysiology testing and pre-existing records as far as provided in visit., my assessment is :  1)Walking test documented drop in oxygen ( Pulmonology)  2) not clear what leads to sudden sharp headaches or dizziness - no identified any triggers.  3) sudden dizziness comes with fatigue, too.  Plan:  Treatment plan and additional workup : Insomnia , multifactorial- but no physiological organic reason was identified. Her pulmonary, cardiac and rheumatological conditions play a role, as they likely do in dizziness.    Repeat a HST rather than in lab test,  This time a watch pat on the toe!   Repeat an orthostatic BP next visit. Dizziness is most like movement related, may be postural ,too   Prn lowest dose xanax for dizziness- prn.  Hydration encouraged.    Asencion Partridge Hanish Laraia MD  8/33/3832   CC: Jodi Marble, MD - [facility] ENT   CC: Loralie Champagne, MD Cardiology    CC; Mallie Mussel, MD  Cardio- Thoracic surgery   [place], MD,  Pulmonology    Deland Pretty, [address] [facility] [address],  [place] [address]

## 2021-02-03 ENCOUNTER — Telehealth: Payer: Self-pay

## 2021-02-03 NOTE — Telephone Encounter (Signed)
LVM for pt to call me back to schedule sleep study  

## 2021-02-05 ENCOUNTER — Encounter: Payer: Self-pay | Admitting: Internal Medicine

## 2021-02-05 ENCOUNTER — Ambulatory Visit: Payer: BC Managed Care – PPO | Admitting: Internal Medicine

## 2021-02-05 ENCOUNTER — Other Ambulatory Visit: Payer: Self-pay

## 2021-02-05 VITALS — BP 120/84 | HR 89 | Temp 97.5°F | Ht 64.75 in | Wt 186.7 lb

## 2021-02-05 DIAGNOSIS — R0902 Hypoxemia: Secondary | ICD-10-CM

## 2021-02-05 DIAGNOSIS — R0600 Dyspnea, unspecified: Secondary | ICD-10-CM

## 2021-02-05 DIAGNOSIS — Z7185 Encounter for immunization safety counseling: Secondary | ICD-10-CM

## 2021-02-05 DIAGNOSIS — Z79899 Other long term (current) drug therapy: Secondary | ICD-10-CM

## 2021-02-05 DIAGNOSIS — M329 Systemic lupus erythematosus, unspecified: Secondary | ICD-10-CM | POA: Diagnosis not present

## 2021-02-05 DIAGNOSIS — D84821 Immunodeficiency due to drugs: Secondary | ICD-10-CM

## 2021-02-05 DIAGNOSIS — R0609 Other forms of dyspnea: Secondary | ICD-10-CM

## 2021-02-05 DIAGNOSIS — R942 Abnormal results of pulmonary function studies: Secondary | ICD-10-CM | POA: Diagnosis not present

## 2021-02-05 MED ORDER — SPIRIVA RESPIMAT 2.5 MCG/ACT IN AERS: 2.0000 | INHALATION_SPRAY | Freq: Every day | RESPIRATORY_TRACT | 0 refills | Status: DC

## 2021-02-05 NOTE — Progress Notes (Signed)
OV 11/19/2020  Subjective:  Patient ID: Katie [name], female , DOB: 05-24-1966 , age 55 y.o. , MRN: [id number] , ADDRESS: [address] [place] [address] PCP Deland Pretty, MD Patient Care Team: Deland Pretty, MD as PCP - General (Internal Medicine)  This Provider for this visit: Treatment Team:  Attending Provider: Brand Males, MD    11/19/2020 -   Chief Complaint  Patient presents with  . Consult    ILD consult. Chronic cough, SOB with exertion. Some wheezing     HPI Katie [name] 55 y.o. -presents for new consultation.  She is to see Dr. Christinia Gully but that was several years ago and therefore is a new consultation.  She has been referred by Dr. Loralie Champagne based on chart review and her history.  She has mitral valve prolapse and she says on a 6-minute walk test she desaturated.  And given her lupus she has been referred here.  She gives a history of SLE since early 53s.  She is currently on immunosuppressed with prednisone, Imuran and also Benlysta infusions.  She says lupus initially involved her kidneys but does not at this point in time.  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  Also involves the heart and is the basis of mitral valve repair.  She says she also has Raynaud's.  Overall she feels lupus is well controlled.  She works as a Teacher, early years/pre  In terms of pulmonary in 2012 she had CT scan of the chest that reported emphysema but no obvious description of fibrosis but chest x-rays in 2015 reported fibrosis she does not have any CT scan of the chest at that time.  Her last PFTs were in 2015 that is reviewed below and visualized the image myself.  It shows restriction with a low DLCO consistent with someone with fibrosis.  In terms of her symptoms she has stable dyspnea on exertion.  She works as a Teacher, early years/pre.  She has chronic cough that for which she believes she will benefit from any  symptom relief.  She has had a Covid vaccine but the response to the vaccine is not known.  There is no Covid IgG check so far.  She is open to using oxygen but does not want to use it at work.  She denies any mold exposure or bird exposure or feather dust exposure or having down pillow   Echocardiogram #2021  -Normal functioning mitral valve that was repaired.  Normal ejection fraction normal right ventricular function   Sleep study 2017   -Apnea-hypopnea index 2.7  Results for CHLOEE, TENA (MRN [id number]) as of 11/19/2020 10:00  Ref. Range 06/10/2009 20:08  Anti Nuclear Antibody (ANA) Latest Ref Range: NEGATIVE  POS (A)  ANA Pattern 1 Unknown SPECKLED  ANA Titer 1 Latest Ref Range: <1:40  1:320 (H)  ds DNA Ab Latest Ref Range: <5  69 (H)  Scleroderma (Scl-70) (ENA) Antibody, IgG Latest Ref Range: <1.0  0.5     Walk test   Simple office walk 185 feet x  3 laps goal with forehead probe 11/19/2020   O2 used ra  Number laps completed 3  Comments about pace x  Resting Pulse Ox/HR 100% and 87/min  Final Pulse Ox/HR 88% and 110/min  Desaturated </= 88% yes  Desaturated <= 3% points yes  Got Tachycardic >/= 90/min yes  Symptoms at end of test x  Miscellaneous comments x   '  CT Chest data    PFT  PFT Results Latest Ref Rng & Units 08/16/2014 02/08/2014  FVC-Pre L 1.16 1.14  FVC-Predicted Pre % 39 38  FVC-Post L 1.25 1.16  FVC-Predicted Post % 42 39  Pre FEV1/FVC % % 73 71  Post FEV1/FCV % % 76 78  FEV1-Pre L 0.85 0.82  FEV1-Predicted Pre % 35 34  FEV1-Post L 0.95 0.90  DLCO uncorrected ml/min/mmHg 11.83 11.54  DLCO UNC% % 48 47  DLVA Predicted % 104 99  TLC L 3.23 3.04  TLC % Predicted % 63 60  RV % Predicted % 112 108      Results for [name], [name] (MRN [id number]) as of 11/19/2020 10:00  Ref. Range 08/19/2020 10:33  Creatinine Latest Ref Range: 0.44 - 1.00 mg/dL 0.69  Results for [name], [name] (MRN [id number]) as of 11/19/2020 10:00  Ref. Range 08/19/2020  10:33  Hemoglobin Latest Ref Range: 12.0 - 15.0 g/dL 13.1   ECHO 09/02/2020  IMPRESSIONS    1. Left ventricular ejection fraction, by estimation, is 60 to 65%. The  left ventricle has normal function. The left ventricle has no regional  wall motion abnormalities. Left ventricular diastolic function could not  be evaluated.  2. Right ventricular systolic function was not well visualized. The right  ventricular size is normal. There is normal pulmonary artery systolic  pressure. The estimated right ventricular systolic pressure is 40.9 mmHg.  3. Left atrial size was mildly dilated.  4. The mitral valve has been repaired/replaced. No evidence of mitral  valve regurgitation. No evidence of mitral stenosis. The mean mitral valve  gradient is 5.2 mmHg with average heart rate of 89 bpm. There is a  mechanical valve present in the mitral  position. Procedure Date: 05/19/2010. Echo findings are consistent with  normal structure and function of the mitral valve prosthesis.  5. The aortic valve is normal in structure. Aortic valve regurgitation is  not visualized. No aortic stenosis is present.  6. The inferior vena cava is normal in size with greater than 50%  respiratory variability, suggesting right atrial pressure of 3 mmHg.   Comparison(s): No significant change from prior study. Prior images  reviewed side by side.   FINDINGS  Left Ventricle: Left ventricular ejection fraction, by estimation, is 60  to 65%. The left ventricle has normal function. The left ventricle has no  regional wall motion abnormalities. The left ventricular internal cavity  size was normal in size. There is  no left ventricular hypertrophy. Abnormal (paradoxical) septal motion  consistent with post-operative status. Left ventricular diastolic function  could not be evaluated due to mitral valve replacement. Left ventricular  diastolic function could not be  evaluated.   Right Ventricle: The right  ventricular size is normal. No increase in  right ventricular wall thickness. Right ventricular systolic function was  not well visualized. There is normal pulmonary artery systolic pressure.  The tricuspid regurgitant velocity is  2.34 m/s, and with an assumed right atrial pressure of 3 mmHg, the  estimated right ventricular systolic pressure is 73.5 mmHg.   Left Atrium: Left atrial size was mildly dilated.   Right Atrium: Right atrial size was normal in size.   Pericardium: There is no evidence of pericardial effusion.   Mitral Valve: The mitral valve has been repaired/replaced. No evidence of  mitral valve regurgitation. There is a mechanical valve present in the  mitral position. Procedure Date: 05/19/2010. Echo findings are  consistent  with normal structure and function  of the mitral valve prosthesis. No evidence of mitral valve stenosis. The  mean mitral valve gradient is 5.2 mmHg with average heart rate of 89 bpm.   Tricuspid Valve: The tricuspid valve is normal in structure. Tricuspid  valve regurgitation is mild . No evidence of tricuspid stenosis.   Aortic Valve: The aortic valve is normal in structure. Aortic valve  regurgitation is not visualized. No aortic stenosis is present.   Pulmonic Valve: The pulmonic valve was normal in structure. Pulmonic valve  regurgitation is not visualized. No evidence of pulmonic stenosis.   Aorta: The aortic root is normal in size and structure.   Venous: The inferior vena cava is normal in size with greater than 50%  respiratory variability, suggesting right atrial pressure of 3 mmHg.   IAS/Shunts: No atrial level shunt detected by color flow      OV 02/05/2021  Subjective:  Patient ID: Katie [name], female , DOB: 1966-01-15 , age 5 y.o. , MRN: [id number] , ADDRESS: [address] [place] [address] PCP Deland Pretty, MD Patient Care Team: Deland Pretty, MD as PCP - General (Internal Medicine)  This Provider for this  visit: Treatment Team:  Attending Provider: Brand Males, MD    02/05/2021 -   Chief Complaint  Patient presents with  . Follow-up    Doing ok, breathing is the same     HPI Katie [name] 55 y.o. - returns for follow-up.  She has lupus immunosuppression restricted PFTs exertional hypoxemia and exertional dyspnea.  High concern for interstitial lung disease but she had high-resolution CT chest the chest shows emphysema.  She had pulmonary function test that still shows restriction and low DLCO but it is stable compared to 02-08-2014.  I interviewed her.  Her mom used to be a heavy smoker and she died in 02-08-2005.  During this time patient was heavily exposed to cigarette smoke at home.  The current CT is similar to 09-Feb-2011 many years ago which showed emphysema.  Patient is not using exertional oxygen because of poor customer service from [employer].  She does not want to be with that agency again.  She is willing to try other treatment for emphysema.  I had a to the ILD exposure questionnaire.  It appears that she has shortness of breath for 13 years gradually getting worse.  Symptom score is below.  She also has a chronic cough that is moderate in intensity and is been going on since 02/09/08.  She does have lupus she does have Raynaud's.  She has fatigue she has arthralgia she is really not she does have some snoring.  Family history is positive for emphysema in her mother.  Organic and inorganic antigen exposure history is negative    SYMPTOM SCALE - emphysema, luspus 02/05/2021   O2 use ra  Shortness of Breath 0 -> 5 scale with 5 being worst (score 6 If unable to do)  At rest 0  Simple tasks - showers, clothes change, eating, shaving 0  Household (dishes, doing bed, laundry) 1  Shopping 3  Walking level at own pace 4  Walking up Stairs 5  Total (30-36) Dyspnea Score 13  How bad is your cough? 5  How bad is your fatigue 5  How bad is nausea 0  How bad is vomiting?  0  How bad is diarrhea?  0  How bad is anxiety? 3  How bad is depression 2  Walk test  Simple office walk 185 feet x  3 laps goal with forehead probe 11/19/2020   O2 used ra  Number laps completed 3  Comments about pace x  Resting Pulse Ox/HR 100% and 87/min  Final Pulse Ox/HR 88% and 110/min  Desaturated </= 88% yes  Desaturated <= 3% points yes  Got Tachycardic >/= 90/min yes  Symptoms at end of test x  Miscellaneous comments x      CT Chest data 12/01/20   IMPRESSION: 1. Emphysema (ICD10-J43.9) with scattered pulmonary parenchymal scarring. No definitive evidence of superimposed interstitial lung disease. 2. Aortic atherosclerosis (ICD10-I70.0). Coronary artery calcification.   Electronically Signed   By: Leanna Battles M.D.   On: 12/01/2020 13:59    PFT  PFT Results Latest Ref Rng & Units 12/05/2020 08/16/2014 02/08/2014  FVC-Pre L 1.17 1.16 1.14  FVC-Predicted Pre % 41 39 38  FVC-Post L 1.27 1.25 1.16  FVC-Predicted Post % 44 42 39  Pre FEV1/FVC % % 72 73 71  Post FEV1/FCV % % 69 76 78  FEV1-Pre L 0.84 0.85 0.82  FEV1-Predicted Pre % 37 35 34  FEV1-Post L 0.88 0.95 0.90  DLCO uncorrected ml/min/mmHg 9.57 11.83 11.54  DLCO UNC% % 45 48 47  DLVA Predicted % 102 104 99  TLC L 3.28 3.23 3.04  TLC % Predicted % 64 63 60  RV % Predicted % 98 112 108       has a past medical history of Abscess of right breast, C. difficile diarrhea (1/12), CAD (coronary artery disease), Cardiac tamponade, Cataract, CHF (congestive heart failure) (HCC), CHF (congestive heart failure) (HCC), Hypertensive retinopathy, Mixed connective tissue disease (HCC), Nephrotic syndrome, Pulmonary HTN (HCC), Pulmonary HTN (HCC), Raynaud's syndrome, Restrictive lung disease, Rheumatic fever, and Warfarin anticoagulation.   reports that she has never smoked. She has never used smokeless tobacco.  Past Surgical History:  Procedure Laterality Date  . BREAST CYST ASPIRATION  03/09/2012  . CESAREAN SECTION     . COLONOSCOPY    . s/p mitral valvue replacement  05/13/2010   by Dr. [name] by minimally invasive window.   . TUBAL LIGATION      Allergies  Allergen Reactions  . Cephalexin Other (See Comments)    HIVES  . Methotrexate     REACTION: intolerance    Immunization History  Administered Date(s) Administered  . Influenza Split 09/22/2013, 07/11/2014  . Influenza,inj,Quad PF,6+ Mos 05/31/2017, 07/15/2018  . Influenza-Unspecified 07/25/2012, 08/22/2013, 07/29/2014, 07/17/2015, 07/06/2016  . PFIZER(Purple Top)SARS-COV-2 Vaccination 06/23/2020  . Pneumococcal Conjugate-13 01/04/2018  . Pneumococcal Polysaccharide-23 07/23/2009, 05/10/2018  . Tdap 02/10/2011  . Unspecified SARS-COV-2 Vaccination 12/10/2019    Family History  Problem Relation Age of Onset  . Emphysema Mother        smoker  . COPD Mother   . Heart disease Maternal Grandmother        side of family not given  . Diabetes Maternal Grandmother   . Hypertension Father   . Osteoarthritis Father   . Sleep apnea Father   . Breast cancer Maternal Aunt      Current Outpatient Medications:  .  acetaminophen (TYLENOL) 325 MG tablet, Take 650 mg by mouth. As directed as needed, Disp: , Rfl:  .  ALPRAZolam (XANAX) 0.25 MG tablet, For dizziness prn po., Disp: 30 tablet, Rfl: 0 .  aspirin 81 MG EC tablet, Take 81 mg by mouth daily., Disp: , Rfl:  .  azaTHIOprine (IMURAN) 50 MG tablet, Take 50 mg  by mouth daily. , Disp: , Rfl:  .  belimumab (BENLYSTA) 400 MG SOLR injection, Inject 10 mg/kg into the vein every 30 (thirty) days., Disp: , Rfl:  .  Calcium Carb-Cholecalciferol 600-200 MG-UNIT TABS, 1 tablet with food, Disp: , Rfl:  .  Calcium Citrate-Vitamin D (CALCIUM + D PO), Take by mouth., Disp: , Rfl:  .  Cyanocobalamin (B-12) 1000 MCG TABS, 1 tablet, Disp: , Rfl:  .  furosemide (LASIX) 20 MG tablet, TAKE 1 TABLET DAILY, Disp: 90 tablet, Rfl: 3 .  Magnesium 200 MG TABS, 1 capsule with a meal, Disp: , Rfl:  .  magnesium oxide  (MAG-OX) 400 MG tablet, Take 400 mg by mouth daily., Disp: , Rfl:  .  metoprolol succinate (TOPROL-XL) 25 MG 24 hr tablet, TAKE 1/2 TABLET DAILY, Disp: 45 tablet, Rfl: 3 .  Multiple Vitamin (MULTIVITAMIN) tablet, Take 1 tablet by mouth daily., Disp: , Rfl:  .  potassium chloride SA (KLOR-CON M20) 20 MEQ tablet, Take 1 tablet (20 mEq total) by mouth 2 (two) times daily., Disp: 180 tablet, Rfl: 3 .  predniSONE (DELTASONE) 5 MG tablet, Take 5 mg by mouth daily., Disp: , Rfl:  .  sildenafil (REVATIO) 20 MG tablet, Take 1 tablet (20 mg total) by mouth 3 (three) times daily., Disp: 270 tablet, Rfl: 3 .  valACYclovir (VALTREX) 1000 MG tablet, Take 1,000 mg by mouth daily., Disp: , Rfl:  .  Vitamin D, Cholecalciferol, 400 units CAPS, Take by mouth., Disp: , Rfl:  .  warfarin (COUMADIN) 5 MG tablet, Take as directed by the anticoagulation clinic. (Patient taking differently: Take 5-7.5 mg by mouth See admin instructions. 7.5mg  on Sun,Mon,Wed, Fri, Sat 5mg  on Tues,Thursday), Disp: 135 tablet, Rfl: 3      Objective:   Vitals:   02/05/21 0957  BP: 120/84  Pulse: 89  Temp: (!) 97.5 F (36.4 C)  TempSrc: Oral  SpO2: 96%  Weight: 186 lb 11.2 oz (84.7 kg)  Height: 5' 4.75" (1.645 m)    Estimated body mass index is 31.31 kg/m as calculated from the following:   Height as of this encounter: 5' 4.75" (1.645 m).   Weight as of this encounter: 186 lb 11.2 oz (84.7 kg).  @WEIGHTCHANGE @  [employer]   02/05/21 0957  Weight: 186 lb 11.2 oz (84.7 kg)     Physical Exam   General: No distress. Looks well Neuro: Alert and Oriented x 3. GCS 15. Speech normal Psych: Pleasant Resp:  Barrel Chest - no.  Wheeze - no, Crackles - no, No overt respiratory distress CVS: Normal heart sounds. Murmurs - no Ext: Stigmata of Connective Tissue Disease - RAYNAUD HEENT: Normal upper airway. PEERL +. No post nasal drip        Assessment:       ICD-10-CM   1. History of systemic lupus erythematosus (SLE)  ([place])  M32.9   2. Dyspnea on exertion  R06.00   3. Exercise hypoxemia  R09.02   4. Abnormal PFT  R94.2   5. Immunosuppression due to drug therapy ([place])  D84.821    Z79.899   6. Vaccine counseling  Z71.85        Plan:     Patient Instructions   Oddly enough despite the fact he has restricted PFTs and you have no smoking history and you have a history of lupus on immunosuppression -making a candidate for interstitial lung disease -you actually do not have interstitial lung disease on a CT chest  You have  emphysema instead -presumably from years of passive smoking through her mother who passed away in Feb 08, 2005  Good news is that your lung function is stable between 02/08/14 and 2021-02-08  It is this emphysema that is making her drop oxygen when you walk and most likely contributing to your symptoms  Echo does not show any evidence of pulmonary hypertension  Noted that given immunosuppression that you have had 2 mRNA vaccines against COVID with the last being September 2021  Plan -Start Spiriva Respimat 2.5 strength 2 puffs once daily with albuterol as needed [take 8-week sample] -Hold off on exertional oxygen for the moment [understood that helpful customer service from [employer]]  -We can retest response to Spiriva and then reassess -Recommend third dose mRNA vaccine against COVID [previously took [place] and there is some new evidence to suggest rotating to [facility] might be beneficial)  Get rid of the mold and mildew in the house.  Follow-up - Return in 3 months in a 15-minute slot to give follow-up  -Simple walk test and check COVID IgG at follow-up and alpha-1 antitrypsin.  She does not smoke any marijuana.  No cocaine no drugs.  [place] setting home  Sometimes is mold and mildew.     SIGNATURE    Dr. Brand Males, M.D., F.C.C.P,  Pulmonary and Critical Care Medicine Staff Physician, [place] Director - Interstitial Lung Disease  Program  [facility] [place] [facility] [place], [place], [address]  Pager: [phone], If no answer or between  15:00h - 7:00h: call [phone] Telephone: [phone]  10:31 AM 02/05/2021

## 2021-02-05 NOTE — Patient Instructions (Addendum)
  Oddly enough despite the fact he has restricted PFTs and you have no smoking history and you have a history of lupus on immunosuppression -making a candidate for interstitial lung disease -you actually do not have interstitial lung disease on a CT chest  You have emphysema instead -presumably from years of passive smoking through her mother who passed away in 02/04/05  Good news is that your lung function is stable between February 04, 2014 and 2021/02/04  It is this emphysema that is making her drop oxygen when you walk and most likely contributing to your symptoms  Echo does not show any evidence of pulmonary hypertension  Noted that given immunosuppression that you have had 2 mRNA vaccines against COVID with the last being September 2021  Plan -Start Spiriva Respimat 2.5 strength 2 puffs once daily with albuterol as needed [take 8-week sample] -Hold off on exertional oxygen for the moment [understood that helpful customer service from [employer]]  -We can retest response to Spiriva and then reassess -Recommend third dose mRNA vaccine against COVID [previously took [place] and there is some new evidence to suggest rotating to [facility] might be beneficial)  Get rid of the mold and mildew in the house.  Follow-up - Return in 3 months in a 15-minute slot to give follow-up  -Simple walk test and check COVID IgG at follow-up and alpha-1 antitrypsin.  She does not smoke any marijuana.  No cocaine no drugs.  [place] setting home  Sometimes is mold and mildew.

## 2021-02-05 NOTE — Addendum Note (Signed)
Addended byCoralie Keens on: 02/05/2021 04:05 PM   Modules accepted: Orders

## 2021-02-06 ENCOUNTER — Ambulatory Visit: Payer: BC Managed Care – PPO | Admitting: Internal Medicine

## 2021-02-10 ENCOUNTER — Telehealth: Payer: Self-pay

## 2021-02-10 NOTE — Telephone Encounter (Signed)
LVM for pt about wearing HST on wrist (not foot). Dr Brett Fairy is aware now that our HST device will not allow to be worn on foot.

## 2021-02-23 ENCOUNTER — Ambulatory Visit (INDEPENDENT_AMBULATORY_CARE_PROVIDER_SITE_OTHER): Payer: BC Managed Care – PPO | Admitting: Neurology

## 2021-02-23 DIAGNOSIS — I270 Primary pulmonary hypertension: Secondary | ICD-10-CM

## 2021-02-23 DIAGNOSIS — F5103 Paradoxical insomnia: Secondary | ICD-10-CM

## 2021-02-23 DIAGNOSIS — R42 Dizziness and giddiness: Secondary | ICD-10-CM

## 2021-02-23 DIAGNOSIS — I7300 Raynaud's syndrome without gangrene: Secondary | ICD-10-CM

## 2021-02-23 DIAGNOSIS — I5032 Chronic diastolic (congestive) heart failure: Secondary | ICD-10-CM

## 2021-02-23 DIAGNOSIS — G4733 Obstructive sleep apnea (adult) (pediatric): Secondary | ICD-10-CM | POA: Diagnosis not present

## 2021-02-23 DIAGNOSIS — J8410 Pulmonary fibrosis, unspecified: Secondary | ICD-10-CM

## 2021-02-25 NOTE — Progress Notes (Signed)
[facility] (Watch PAT)  STUDY DATE: 02-25-21  DOB: [birth year]  MRN: [id number]  ORDERING CLINICIAN: Larey Seat, MD   REFERRING CLINICIAN: Deland Pretty, MD   CLINICAL INFORMATION/HISTORY: Katie [name] is a 55 y.o. female , now re- referred by Dr Shelia Media. Dizziness and sharp headaches.  The patient was originally seen here for recurrent dizziness and insomnia- she had completed a work-up with ENT for vertigo which was negative. She had a sleep study with [place] 5 years ago.  In 2019 she had a concern about sharp and sudden onset of headaches which have resolved.   She has pulmonary hypertension and she states pulmonary fibrosis followed by Dr. Chase Caller her congestive heart failure has been followed by Dr. Daisy Floro, she still has trouble with paradoxical insomnia.with positive ANA N, mixed connective tissue disorder, history of mitral valve replacement, paroxysmal spells, Raynaud's syndrome, discoid lupus, restrictive lung disease nephrotic syndrome and systolic congestive heart failure.  She had been diagnosed with pulmonary hypertension and coronary artery disease as well as with peri-cardial effusion.   Epworth sleepiness score: 7/24.  BMI: 31.6 kg/m  Neck Circumference: 13.25"  FINDINGS:   Total Record Time (hours, min): 7 H 51 min  Total Sleep Time (hours, min):  7 H 1 min   Percent REM (%):    16.03 %   Calculated pAHI (per hour): 86.2      REM pAHI: 74.1   NREM pAHI: 88.9 Supine AHI: 86.1   Oxygen Saturation (%) Mean: 93  Minimum oxygen saturation (%):         78   O2 Saturation Range (%): 78-98  O2Saturation (minutes) <=88%: 2.6 min  Pulse Mean (bpm):    78  Pulse Range (67-93)   IMPRESSION: This HST documented a very severe form of OSA (obstructive sleep apnea) without prolonged hypoxemia.   RECOMMENDATION: PAP therapy would be advised for any patient with this severe HST result. Auto CPAP 6-18 cm water 3 cm EPR and mask of  choice. ONO to follow while on CPAP in the next 30-60 days.   INTERPRETING PHYSICIAN:  Larey Seat, MD    [facility] Sleep Board certified by The [employer] of Sleep Medicine and Diplomate of the [employer] of Sleep Medicine. Board certified In Neurology through the [place], Fellow of the [employer] of Neurology. Medical Director of [employer].  Sleep Summary  Oxygen Saturation Statistics   Start Study Time: End Study Time: Total Recording Time:  9:28:58 PM 5:20:01 AM 7 h, 51 min  Total Sleep Time % REM of Sleep Time:  7 h, 1 min  16.0    Mean: 93 Minimum: 78 Maximum: 98  Mean of Desaturations Nadirs (%):   91  Oxygen Desatur. %:  4-9 10-20 >20 Total  Events Number Total   191  4 97.9 2.1  0 0.0  195 100.0  Oxygen Saturation: <90 <=88 <85 <80 <70  Duration (minutes): Sleep % 3.9 0.9 2.6 0.2 0.6 0.0 0.0 0.0 0.0 0.0     Respiratory Indices      Total Events REM NREM All Night  pRDI: pAHI 3%: ODI 4%: pAHIc 3%: % CSR: pAHI 4%:  252  252 195  10 8.3 178 74.1 74.1 61.1 9.4 88.9 88.9 67.9 4.1 86.2 86.2 66.7 5.7  60.9       Pulse Rate Statistics during Sleep (BPM)      Mean: 78 Minimum: 67 Maximum: 93  Body Position Statistics  Position Supine Prone Right Left Non-Supine  Sleep (min) 336.5 9.5 52.0 23.0 84.5  Sleep % 79.9 2.3 12.4 5.5 20.1  pRDI 86.1 N/A N/A N/A 87.1  pAHI 3% 86.1 N/A N/A N/A 87.1  ODI 4% 66.4 N/A N/A N/A 69.0     Snoring Statistics Snoring Level (dB) >40 >50 >60 >70 >80 >Threshold (45)  Sleep (min) 421.1 387.6 105.2 0.0 0.0 417.7  Sleep % 100.0 92.1 25.0 0.0 0.0 99.2    Mean: 57 dB

## 2021-03-02 ENCOUNTER — Telehealth: Payer: Self-pay

## 2021-03-02 NOTE — Telephone Encounter (Signed)
**   If patient calls back, please advise that given the recent vacation Dr Brett Fairy is also running little more behind than normal but as soon as we have the results we will be in touch.

## 2021-03-02 NOTE — Telephone Encounter (Signed)
The pt lvm in the sleep lab asking about hst results she completed on 02/23/21. I returned the pt's call. No answer, left message informing the pt that usually we ask to give the doctor up to a week and a half to get those results to them. I told the pt to call back if she had any further questions.

## 2021-03-10 ENCOUNTER — Telehealth: Payer: Self-pay | Admitting: Neurology

## 2021-03-10 NOTE — Procedures (Signed)
HOME SLEEP TEST (Watch PAT)  STUDY DATE: 02-25-21  DOB: [birth year]  MRN: [id number]  ORDERING CLINICIAN: Larey Seat, MD   REFERRING CLINICIAN: Deland Pretty, MD   CLINICAL INFORMATION/HISTORY: Katie [name] is a 55 y.o. female , now re- referred by Dr Shelia Media. Dizziness and sharp headaches.  The patient was originally seen here for recurrent dizziness and insomnia- she had completed a work-up with ENT for vertigo which was negative. She had a sleep study with [place] 5 years ago.  In 2019 she had a concern about sharp and sudden onset of headaches which have resolved.   She has pulmonary hypertension and she states pulmonary fibrosis followed by Dr. Chase Caller her congestive heart failure has been followed by Dr. Daisy Floro, she still has trouble with paradoxical insomnia.with positive ANA N, mixed connective tissue disorder, history of mitral valve replacement, paroxysmal spells, Raynaud's syndrome, discoid lupus, restrictive lung disease nephrotic syndrome and systolic congestive heart failure.  She had been diagnosed with pulmonary hypertension and coronary artery disease as well as with peri-cardial effusion.   Epworth sleepiness score: 7/24.  BMI: 31.6 kg/m  Neck Circumference: 13.25"  FINDINGS:   Total Record Time (hours, min): 7 H 51 min  Total Sleep Time (hours, min):  7 H 1 min   Percent REM (%):    16.03 %   Calculated pAHI (per hour): 86.2      REM pAHI: 74.1   NREM pAHI: 88.9 Supine AHI: 86.1   Oxygen Saturation (%) Mean: 93  Minimum oxygen saturation (%):         78   O2 Saturation Range (%): 78-98  O2Saturation (minutes) <=88%: 2.6 min  Pulse Mean (bpm):    78  Pulse Range (67-93)   IMPRESSION: This HST documented a very severe form of OSA (obstructive sleep apnea) without prolonged hypoxemia.   RECOMMENDATION: PAP therapy would be advised for any patient with this severe HST result. Auto CPAP 6-18 cm water 3 cm EPR and mask of choice. ONO to follow while on CPAP in  the next 30-60 days.   INTERPRETING PHYSICIAN:  Larey Seat, MD    [facility] and [facility] Sleep Board certified by The [employer] of Sleep Medicine and Diplomate of the [employer] of Sleep Medicine. Board certified In Neurology through the [employer], Fellow of the [employer] of Neurology. Medical Director of [employer].  Sleep Summary  Oxygen Saturation Statistics   Start Study Time: End Study Time: Total Recording Time:  9:28:58 PM 5:20:01 AM 7 h, 51 min  Total Sleep Time % REM of Sleep Time:  7 h, 1 min  16.0    Mean: 93 Minimum: 78 Maximum: 98  Mean of Desaturations Nadirs (%):   91  Oxygen Desatur. %:  4-9 10-20 >20 Total  Events Number Total   191  4 97.9 2.1  0 0.0  195 100.0  Oxygen Saturation: <90 <=88 <85 <80 <70  Duration (minutes): Sleep % 3.9 0.9 2.6 0.2 0.6 0.0 0.0 0.0 0.0 0.0     Respiratory Indices      Total Events REM NREM All Night  pRDI: pAHI 3%: ODI 4%: pAHIc 3%: % CSR: pAHI 4%:  252  252 195  10 8.3 178 74.1 74.1 61.1 9.4 88.9 88.9 67.9 4.1 86.2 86.2 66.7 5.7  60.9       Pulse Rate Statistics during Sleep (BPM)      Mean: 78 Minimum: 67 Maximum: 93  Body Position Statistics  Position Supine Prone Right Left Non-Supine  Sleep (min) 336.5 9.5 52.0 23.0 84.5  Sleep % 79.9 2.3 12.4 5.5 20.1  pRDI 86.1 N/A N/A N/A 87.1  pAHI 3% 86.1 N/A N/A N/A 87.1  ODI 4% 66.4 N/A N/A N/A 69.0     Snoring Statistics Snoring Level (dB) >40 >50 >60 >70 >80 >Threshold (45)  Sleep (min) 421.1 387.6 105.2 0.0 0.0 417.7  Sleep % 100.0 92.1 25.0 0.0 0.0 99.2

## 2021-03-10 NOTE — Progress Notes (Signed)
IMPRESSION: This HST documented a very severe form of OSA (obstructive sleep apnea) without prolonged hypoxemia.   RECOMMENDATION: PAP therapy would be advised for any patient with this severe HST result. Auto CPAP 6-18 cm water 3 cm EPR and mask of choice. ONO to follow while on CPAP in the next 30-60 days. Was the patient using OXYGEN during this HST ?   INTERPRETING PHYSICIAN:  Larey Seat, MD

## 2021-03-10 NOTE — Telephone Encounter (Signed)
Study was read- results posted in my chart.

## 2021-03-10 NOTE — Addendum Note (Signed)
Addended by: Larey Seat on: 03/10/2021 08:26 AM   Modules accepted: Orders

## 2021-03-10 NOTE — Telephone Encounter (Signed)
I called pt. I advised pt that Dr. Brett Fairy reviewed their sleep study results and found that pt has severe apnea. Dr. Brett Fairy recommends that pt that pt start auto CPAP. I reviewed PAP compliance expectations with the pt. Pt is agreeable to starting a CPAP. I advised pt that an order will be sent to a DME, [facility] and [facility] ([place] will call the pt within about one week after they file with the pt's insurance. [facility]) will show the pt how to use the machine, fit for masks, and troubleshoot the CPAP if needed. A follow up appt was made for insurance purposes with Ward Givens, NP on Sept 29,2022 at 10 am. Pt verbalized understanding to arrive 15 minutes early and bring their CPAP. A letter with all of this information in it will be mailed to the pt as a reminder. I verified with the pt that the address we have on file is correct. Pt verbalized understanding of results. Pt had no questions at this time but was encouraged to call back if questions arise. I have sent the order to [facility])  and have received confirmation that they have received the order.

## 2021-03-10 NOTE — Telephone Encounter (Signed)
-----   Message from Larey Seat, MD sent at 03/10/2021  8:26 AM EDT ----- IMPRESSION: This HST documented a very severe form of OSA (obstructive sleep apnea) without prolonged hypoxemia.   RECOMMENDATION: PAP therapy would be advised for any patient with this severe HST result. Auto CPAP 6-18 cm water 3 cm EPR and mask of choice. ONO to follow while on CPAP in the next 30-60 days. Was the patient using OXYGEN during this HST ?   INTERPRETING PHYSICIAN:  Larey Seat, MD

## 2021-04-06 ENCOUNTER — Institutional Professional Consult (permissible substitution): Payer: BC Managed Care – PPO | Admitting: Neurology

## 2021-04-23 ENCOUNTER — Other Ambulatory Visit (HOSPITAL_COMMUNITY): Payer: Self-pay | Admitting: Cardiology

## 2021-04-24 ENCOUNTER — Other Ambulatory Visit: Payer: Self-pay | Admitting: Internal Medicine

## 2021-04-24 ENCOUNTER — Encounter: Payer: Self-pay | Admitting: Neurology

## 2021-04-24 DIAGNOSIS — Z1231 Encounter for screening mammogram for malignant neoplasm of breast: Secondary | ICD-10-CM

## 2021-05-01 ENCOUNTER — Telehealth (HOSPITAL_COMMUNITY): Payer: Self-pay | Admitting: *Deleted

## 2021-05-01 NOTE — Telephone Encounter (Signed)
Pt left vm stating her feet, ankles,and legs have been swollen for two weeks. Pt asked that we return her call. I called pt to get more information no answer/left vm for pt to return my call.

## 2021-05-04 NOTE — Progress Notes (Addendum)
Patient ID: Katie [name], female   DOB: [birth year], 55 y.o.   MRN: [id number] PCP: Dr. Shelia Media Cardiology: Dr . Aundra Dubin  55 y.o. with history of mixed connective tissue disease complicated by interstitial fibrosis and nephrotic syndrome, pulmonary HTN and mitral stenosis/regurgitation secondary to rheumatic valve disease s/p mechanical mitral valve replacement in 8/11.  Echo in 1/19 showed EF 60-65%, mechanical mitral valve with mean gradient 5 mmHg, and PA systolic pressure 33 mmHg with normal RV.  Echo in 8/20 showed EF 50-55%, normal RV, mechanical MV with mean gradient 6 mmHg, unable to estimate PA systolic pressure, normal RV.  No change from prior.   She was seen 11/21 and was doing well. 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  She was not volume overloaded, stable NYHA I-II.   Today she returns today with complaints of swelling for the past 3 weeks. She has been driving school bus for summer school and noticed swelling increasing, more so in right leg. No recent long travel or prolonged immobilization. She has taken lasix 40 daily x 3 days, and has seen much improvement.  She denies increasing SOB, CP, dizziness, or PND/Orthopnea. Appetite ok. No fever or chills. Weight at home 191 pounds. Taking all medications.   ECG (personally reviewed): NSR, RBBB ReDs Clip: 36%  Labs (8/11): HCT 27 => 30, creatinine 0.98, INR 4 Labs (9/11): K 4.6, creatinine 0.9, HCT 32.7, BNP 214=>146, LFTs normal, TSH normal Labs (1/12): HCT 37.9, K 3.5, creatinine 0.9 Labs (4/12): LDL 108, HDL 43, TSH normal, K 4.1, creatinine 0.8, HCT 37.9 Labs (1/13): K 3.8, creatinine 0.8 Labs (11/14): K 3.6, creatinine 0.71, LDL 94, HCT 38 Labs (12/15): Hgb 12.5 Labs (2/16): K 3.9, creatinine 0.84 Labs (8/16): K 3.9, creatinine 0.87, HCT 38.6 Labs (4/17): K 3.6, creatinine 0.88, LDL 92, HDL 38, hgb 11.7 Labs (10/17): K 3.6, creatinine 0.81, hgb 12.1 Labs (1/20): K 4.3, creatinine 0.79, LDL 107, hgb 12 Labs  (6/21): creatinine 0.7 Labs (11/21): K 4.4, creatinine 0.69  Allergies (verified):  1)  ! Methotrexate  Past Medical History: 1. Mixed connective tissue disease: diagnosis 1990. 8/10 ANA positive, anti-dsDNA positive, anti-SCL70 negative 2. 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     - PFT's 03/06/99  VC 47%  DLC0 26%    - PFT's 09/12/08  VC 43%  DLC0 45%    - PFT's 6/10 FVC 37%, FEV1 34%, ratio 69%, TLC 50%    - PFTs 11/15 FVC 39%, FEV1 35%, ratio 89%, TLC 63%, DLCO 48% 4. Nephrotic syndrome: secondary to MCTD 5. CHF: Secondary to moderate mitral stenosis and moderate mitral regurgitation in the setting of rheumatic mitral valve disease as well as severe pulmonary HTN likely due to a combination of MV disease and pulmonary arterial HTN in the setting of collagen vascular disease.   Not valvuloplasty candidate due to MR.  Patient had MV replacement with mechanical MV in 8/11.  Echo (8/11) post-op showed normally functioning mechanical MV, EF 60-65% with septal bounce, small mobile structure in the mid ventricle (? loose papillary muscle), RV-RA gradient 22 mmHg.  Echo (10/11): EF 60-65%, normally functioning mechanical mitral valve.  Echo (7/13): EF 55-60%, mechanical mitral valve with mean gradient 4 mmHg, PA systolic pressure 29 mmHg. Echo (5/15) with EF 55-60%, mechanical mitral valve functioning normally, normal RV size and systolic function, PA systolic pressure 32 mmHg.  - Echo in 5/17 showed EF 60-65%, normal mechanical mitral valve,  and PA systolic pressure 33 mmHg with normal RV.  - Echo (1/19): EF 60-65%, mild LVH, normal RV size and systolic function, mechanical mitral valve with mean gradient 6 mmHg, PASP 33 mmHg.  - Echo (8/20): EF 50-55%, normal RV, mechanical MV with mean gradient 6 mmHg, unable to estimate PA systolic pressure, normal RV.  No change from prior.  6.  Pulmonary HTN: Probably secondary to combination of rheumatic MV  disease and pulmonary arterial HTN from collagen vascular disease.  CTA chest (7/10) with no evidence for pulmonary emboli or chronic pulmonary emboli.  PA pressure 72/28 mean 50 with PVR 5.7 WU on initial RHC, PA pressure 45/21 mean 30 PVR 2.3 WU on f/u RHC on Revatio 80 mg three times a day (1/11).  After MV surgery, Revatio stopped.  Increased dyspnea.  Repeat RHC with mean RA 6, PA 42/20 (mean 29), PVR 4.8 WU, CI 2.3.  Revatio 20 mg three times a day restarted.  Echo (0000000) with PA systolic pressure 29 mmHg. Echo (123456) with PA systolic pressure 32 mmHg.  - 3/11 6 min walk 427 m - 2/12 6 min walk 427 m - 1/13 6 min walk 411 m - 4/15 6 min walk 448 m - 2/16 6 min walk 421 m - 8/16 6 min walk 354 m - 9/17 6 min walk 436 m - 1/19 6 min walk 366 m - 10/19 6 min walk 402 m - 8/20 6 min walk 137 m (but walked slowly and stopped early due to knee pain).  7.  Rheumatic fever 8.  LHC (9/10): no angiographic CAD.  9.  Warfarin anticoagulation for mechanical MV, goal INR 2.5-3.5 10.  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  11. 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 C difficile diarrhea 1/12 13. GERD 14. Holter (1/19): No significant arrhythmias.   Family History: Emphysema mother, smoker.  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   ROS: All systems reviewed and negative except as noted in HPI.   Current Outpatient Medications  Medication Sig Dispense Refill   acetaminophen (TYLENOL) 325 MG tablet Take 650 mg by mouth. As directed as needed     ALPRAZolam (XANAX) 0.25 MG tablet For dizziness prn po. 30 tablet 0   aspirin 81 MG EC tablet Take 81 mg by mouth daily.     azaTHIOprine (IMURAN) 50 MG tablet Take 50 mg by mouth daily.      belimumab (BENLYSTA) 400 MG SOLR injection Inject 10 mg/kg into the vein every 30 (thirty) days.     Calcium  Carb-Cholecalciferol 600-200 MG-UNIT TABS 1 tablet with food     Calcium Citrate-Vitamin D (CALCIUM + D PO) Take by mouth.     Cyanocobalamin (B-12) 1000 MCG TABS 1 tablet     furosemide (LASIX) 20 MG tablet TAKE 1 TABLET DAILY 90 tablet 3   magnesium oxide (MAG-OX) 400 MG tablet Take 400 mg by mouth daily.     metoprolol succinate (TOPROL-XL) 25 MG 24 hr tablet TAKE 1/2 TABLET DAILY 45 tablet 3   Multiple Vitamin (MULTIVITAMIN) tablet Take 1 tablet by mouth daily.     potassium chloride SA (KLOR-CON) 20 MEQ tablet TAKE 1 TABLET TWICE A DAY 180 tablet 0   predniSONE (DELTASONE) 5 MG tablet Take 5 mg by mouth daily.     sildenafil (REVATIO) 20 MG tablet Take 1 tablet (20 mg total) by mouth 3 (three)  times daily. 270 tablet 3   Tiotropium Bromide Monohydrate (SPIRIVA RESPIMAT) 2.5 MCG/ACT AERS Inhale 2 puffs into the lungs daily. 4 each 0   valACYclovir (VALTREX) 1000 MG tablet Take 1,000 mg by mouth as needed.     Vitamin D, Cholecalciferol, 400 units CAPS Take by mouth.     warfarin (COUMADIN) 5 MG tablet Take as directed by the anticoagulation clinic. (Patient taking differently: Take 5-7.5 mg by mouth See admin instructions. 7.'5mg'$  on Sun,Mon,Wed, Fri, Sat '5mg'$  on Tues,Thursday) 135 tablet 3   No current facility-administered medications for this encounter.   Wt Readings from Last 3 Encounters:  05/05/21 84.8 kg (187 lb)  02/05/21 84.7 kg (186 lb 11.2 oz)  01/27/21 84.4 kg (186 lb)   BP 110/78   Pulse 88   Ht '5\' 4"'$  (1.626 m)   Wt 84.8 kg (187 lb)   SpO2 99%   BMI 32.10 kg/m  General:  NAD. No resp difficulty HEENT: Normal Neck: Supple. No JVD. Carotids 2+ bilat; no bruits. No lymphadenopathy or thryomegaly appreciated. Cor: PMI nondisplaced. Regular rate & rhythm. No rubs, gallops or murmurs. + Mechanical S1 Lungs: Clear Abdomen: Soft, nontender, nondistended. No hepatosplenomegaly. No bruits or masses. Good bowel sounds. Extremities: No cyanosis, clubbing, rash, 1+ RLE edema,  erythema and warm RLE. Neuro: Alert & oriented x 3, cranial nerves grossly intact. Moves all 4 extremities w/o difficulty. Affect pleasant.  Assessment/Plan:  1. Lower extremity swelling: Resolved some with lasix, however right leg continues w/ 1+ edema and erythema. No visible wounds. Well's Score 2-3. She is on Coumadin, but will check lower extremity dopplers to rule out DVT or Baker's cyst.  - She will continue lasix 40 mg daily x 2 more days, then back to 20 mg daily. BMET & BNP today. - Check d-dimer. 2. Status post mechanical MVR:  Mitral valve replacement, stable from valve standpoint. Echo in 8/20 showed mean gradient 6 mmHg without significant MR.  She is on coumadin goal INR 2.5 - 3.5 and ASA 81 mg daily.    - Stable on echo (11/21). 3.  Pulmonary HTN:  Pulmonary HTN is likely a mixed picture, from mitral valve disease as well as mixed connective tissue disease.  She had mild residual pulmonary HTN with moderately increased PVR even after mitral valve surgery on last RHC though this was improved compared to initial RHC.  The residual pulmonary HTN may be due to pulmonary vascular remodeling with chronic left atrial pressure elevation in the setting of mitral valve disease. Revatio was restarted at 20 mg tid.  Echo in 8/20 showed RV normal but unable to estimate PA systolic pressure.  Echo (11/21) EF 60-65%, RV ok, RVSP 24.9 mmHg. NYHA class I-II. She does not appear to be markedly volume overloaded on exam, however Reds clip mildly elevated at 36% - Lasix as above. - Continue current Revatio. - needs 6 minute walk next appt.  4. Mixed connective tissue disorder: Patient has history of pulmonary fibrosis. Followed [facility]ology at [facility]. - On prednisone and azathioprine.  5. Emphysema: Follows with Dr. Chase Caller.  - On Spiriva.  Followup in 6 months with Dr. Wynema Birch [facility] FNP 05/05/2021

## 2021-05-04 NOTE — Telephone Encounter (Signed)
Pt returned my call and said she has not missed any medications, she is not short of breath, pt has tried to keep legs and feet elevated. Pt said swelling has not gone down and wants to know if she should take extra medication.   Routed to [place] for advice

## 2021-05-04 NOTE — Telephone Encounter (Signed)
I spoke with pt. Pt said she tried increasing to '40mg'$  for 3days and that has not helped. APP appt scheduled for tomorrow.

## 2021-05-04 NOTE — Telephone Encounter (Signed)
Increase Lasix to 40 mg daily x 3 days then back to 20 mg daily.  If this does not help, needs to be seen in office.

## 2021-05-05 ENCOUNTER — Other Ambulatory Visit: Payer: Self-pay

## 2021-05-05 ENCOUNTER — Encounter (HOSPITAL_COMMUNITY): Payer: Self-pay

## 2021-05-05 ENCOUNTER — Ambulatory Visit (HOSPITAL_COMMUNITY)
Admission: RE | Admit: 2021-05-05 | Discharge: 2021-05-05 | Disposition: A | Discharge status: Home / Self Care | Payer: BC Managed Care – PPO | Source: Ambulatory Visit | Attending: Family Medicine | Admitting: Family Medicine

## 2021-05-05 VITALS — BP 110/78 | HR 88 | Ht 64.0 in | Wt 187.0 lb

## 2021-05-05 DIAGNOSIS — I059 Rheumatic mitral valve disease, unspecified: Secondary | ICD-10-CM

## 2021-05-05 DIAGNOSIS — I5022 Chronic systolic (congestive) heart failure: Secondary | ICD-10-CM | POA: Diagnosis not present

## 2021-05-05 DIAGNOSIS — Z7952 Long term (current) use of systemic steroids: Secondary | ICD-10-CM | POA: Insufficient documentation

## 2021-05-05 DIAGNOSIS — Z8249 Family history of ischemic heart disease and other diseases of the circulatory system: Secondary | ICD-10-CM | POA: Insufficient documentation

## 2021-05-05 DIAGNOSIS — Z952 Presence of prosthetic heart valve: Secondary | ICD-10-CM | POA: Insufficient documentation

## 2021-05-05 DIAGNOSIS — Z7982 Long term (current) use of aspirin: Secondary | ICD-10-CM | POA: Insufficient documentation

## 2021-05-05 DIAGNOSIS — M351 Other overlap syndromes: Secondary | ICD-10-CM | POA: Insufficient documentation

## 2021-05-05 DIAGNOSIS — Z79899 Other long term (current) drug therapy: Secondary | ICD-10-CM | POA: Insufficient documentation

## 2021-05-05 DIAGNOSIS — I272 Pulmonary hypertension, unspecified: Secondary | ICD-10-CM | POA: Insufficient documentation

## 2021-05-05 DIAGNOSIS — J439 Emphysema, unspecified: Secondary | ICD-10-CM | POA: Insufficient documentation

## 2021-05-05 DIAGNOSIS — Z7901 Long term (current) use of anticoagulants: Secondary | ICD-10-CM | POA: Insufficient documentation

## 2021-05-05 DIAGNOSIS — R600 Localized edema: Secondary | ICD-10-CM | POA: Insufficient documentation

## 2021-05-05 DIAGNOSIS — M7989 Other specified soft tissue disorders: Secondary | ICD-10-CM | POA: Insufficient documentation

## 2021-05-05 LAB — BASIC METABOLIC PANEL
Anion gap: 4 — ABNORMAL LOW (ref 5–15)
BUN: 7 mg/dL (ref 6–20)
CO2: 30 mmol/L (ref 22–32)
Calcium: 8.8 mg/dL — ABNORMAL LOW (ref 8.9–10.3)
Chloride: 105 mmol/L (ref 98–111)
Creatinine, Ser: 0.78 mg/dL (ref 0.44–1.00)
GFR, Estimated: >60 mL/min (ref >60)
Glucose, Bld: 87 mg/dL (ref 70–99)
Potassium: 4.3 mmol/L (ref 3.5–5.1)
Sodium: 139 mmol/L (ref 135–145)

## 2021-05-05 LAB — BRAIN NATRIURETIC PEPTIDE: B Natriuretic Peptide: 74.3 pg/mL (ref 0.0–100.0)

## 2021-05-05 LAB — D-DIMER, QUANTITATIVE: D-Dimer, Quant: 0.33 ug/mL-FEU (ref 0.00–0.50)

## 2021-05-05 NOTE — Progress Notes (Signed)
ReDS Vest / Clip - 05/05/21 1200       ReDS Vest / Clip   Station Marker A    Ruler Value 25.5    ReDS Value Range Moderate volume overload    ReDS Actual Value 36

## 2021-05-05 NOTE — Patient Instructions (Addendum)
INCREASE Lasix to 40 mg daily for 2 days, then resume normal dose of 20 mg daily thereafter  Labs today We will only contact you if something comes back abnormal or we need to make some changes. Otherwise no news is good news!  Your physician has requested that you have a lower extremity arterial exercise duplex. During this test, exercise and ultrasound are used to evaluate arterial blood flow in the legs. Allow one hour for this exam. There are no restrictions or special instructions.  Keep follow up as scheduled with Dr Aundra Dubin  Do the following things EVERYDAY: Weigh yourself in the morning before breakfast. Write it down and keep it in a log. Take your medicines as prescribed Eat low salt foods--Limit salt (sodium) to 2000 mg per day.  Stay as active as you can everyday Limit all fluids for the day to less than 2 liters  milAt the [facility], you and your health needs are our priority. As part of our continuing mission to provide you with exceptional heart care, we have created designated Provider Care Teams. These Care Teams include your primary Cardiologist (physician) and Advanced Practice Providers (APPs- Physician Assistants and Nurse Practitioners) who all work together to provide you with the care you need, when you need it.   You may see any of the following providers on your designated Care Team at your next follow up: Dr Glori Bickers Dr Loralie Champagne Dr Patrice Paradise, NP Lyda Jester, [place] Ginnie Smart Audry Riles, PharmD   Please be sure to bring in all your medications bottles to every appointment.    If you have any questions or concerns before your next appointment please send [place] a message through [place] or call our office at [phone].    TO LEAVE A MESSAGE FOR THE NURSE SELECT OPTION 2, PLEASE LEAVE A MESSAGE INCLUDING: YOUR NAME DATE OF BIRTH CALL BACK NUMBER REASON FOR CALL**this is important as we prioritize  the call backs  YOU WILL RECEIVE A CALL BACK THE SAME DAY AS LONG AS YOU CALL BEFORE 4:00 PM

## 2021-05-06 ENCOUNTER — Ambulatory Visit (HOSPITAL_COMMUNITY)
Admission: RE | Admit: 2021-05-06 | Discharge: 2021-05-06 | Disposition: A | Discharge status: Home / Self Care | Payer: BC Managed Care – PPO | Source: Ambulatory Visit | Attending: Cardiology | Admitting: Cardiology

## 2021-05-06 DIAGNOSIS — R600 Localized edema: Secondary | ICD-10-CM | POA: Diagnosis present

## 2021-05-06 NOTE — Addendum Note (Signed)
Encounter addended by: Rafael Bihari, FNP on: 05/06/2021 8:34 AM  Actions taken: Clinical Note Signed

## 2021-06-01 ENCOUNTER — Telehealth: Payer: Self-pay | Admitting: Neurology

## 2021-06-01 NOTE — Telephone Encounter (Signed)
Noted  

## 2021-06-01 NOTE — Telephone Encounter (Signed)
Pt returned call stated she started using her cpap in June two weeks after seeing provider so it has been more than 31 days of use. Pt was informed to bring Cpap machine and power cord to her appt.

## 2021-06-02 ENCOUNTER — Encounter: Payer: Self-pay | Admitting: Neurology

## 2021-06-02 ENCOUNTER — Ambulatory Visit: Payer: BC Managed Care – PPO | Admitting: Neurology

## 2021-06-02 VITALS — BP 118/79 | HR 82 | Ht 64.0 in | Wt 190.0 lb

## 2021-06-02 DIAGNOSIS — I7300 Raynaud's syndrome without gangrene: Secondary | ICD-10-CM

## 2021-06-02 DIAGNOSIS — G4733 Obstructive sleep apnea (adult) (pediatric): Secondary | ICD-10-CM | POA: Insufficient documentation

## 2021-06-02 DIAGNOSIS — I270 Primary pulmonary hypertension: Secondary | ICD-10-CM | POA: Diagnosis not present

## 2021-06-02 DIAGNOSIS — T732XXA Exhaustion due to exposure, initial encounter: Secondary | ICD-10-CM | POA: Insufficient documentation

## 2021-06-02 DIAGNOSIS — L930 Discoid lupus erythematosus: Secondary | ICD-10-CM

## 2021-06-02 DIAGNOSIS — T732XXD Exhaustion due to exposure, subsequent encounter: Secondary | ICD-10-CM

## 2021-06-02 NOTE — Patient Instructions (Signed)
Fatigue If you have fatigue, you feel tired all the time and have a lack of energy or a lack of motivation. Fatigue may make it difficult to start or complete tasks because of exhaustion. In general, occasional or mild fatigue is often a normal response to activity or life. However, long-lasting (chronic) or extreme fatigue may be a symptom of a medical condition. Follow these instructions at home: General instructions Watch your fatigue for any changes. Go to bed and get up at the same time every day. Avoid fatigue by pacing yourself during the day and getting enough sleep at night. Maintain a healthy weight. Medicines Take over-the-counter and prescription medicines only as told by your health care provider. Take a multivitamin, if told by your health care provider.  Do not use herbal or dietary supplements unless they are approved by your health care provider. Activity  Exercise regularly, as told by your health care provider. Use or practice techniques to help you relax, such as yoga, tai chi, meditation, or massage therapy.  Eating and drinking  Avoid heavy meals in the evening. Eat a well-balanced diet, which includes lean proteins, whole grains, plenty of fruits and vegetables, and low-fat dairy products. Avoid consuming too much caffeine. Avoid the use of alcohol. Drink enough fluid to keep your urine pale yellow.  Lifestyle Change situations that cause you stress. Try to keep your work and personal schedule in balance. Do not use any products that contain nicotine or tobacco, such as cigarettes and e-cigarettes. If you need help quitting, ask your health care provider. Do not use drugs. Contact a health care provider if: Your fatigue does not get better. You have a fever. You suddenly lose or gain weight. You have headaches. You have trouble falling asleep or sleeping through the night. You feel angry, guilty, anxious, or sad. You are unable to have a bowel movement  (constipation). Your skin is dry. You have swelling in your legs or another part of your body. Get help right away if: You feel confused. Your vision is blurry. You feel faint or you pass out. You have a severe headache. You have severe pain in your abdomen, your back, or the area between your waist and hips (pelvis). You have chest pain, shortness of breath, or an irregular or fast heartbeat. You are unable to urinate, or you urinate less than normal. You have abnormal bleeding, such as bleeding from the rectum, vagina, nose, lungs, or nipples. You vomit blood. You have thoughts about hurting yourself or others. If you ever feel like you may hurt yourself or others, or have thoughts about taking your own life, get help right away. You can go to your nearest emergency department or call: Your local emergency services (911 in the [place] A suicide crisis helpline, such as the [facility] at (435) 814-2878. This is open 24 hours a day. Summary If you have fatigue, you feel tired all the time and have a lack of energy or a lack of motivation. Fatigue may make it difficult to start or complete tasks because of exhaustion. Long-lasting (chronic) or extreme fatigue may be a symptom of a medical condition. Exercise regularly, as told by your health care provider. Change situations that cause you stress. Try to keep your work and personal schedule in balance. This information is not intended to replace advice given to you by your health care provider. Make sure you discuss any questions you have with your healthcare provider. Document Revised: 08/07/2020 Document Reviewed: 08/07/2020  [employer]vier Patient Education  2022 [employer]. Sleep Apnea Sleep apnea affects breathing during sleep. It causes breathing to stop for 10 seconds or more, or to become shallow. People with sleep apnea usually snoreloudly. It can also increase the risk of: Heart attack. Stroke. Being very  overweight (obese). Diabetes. Heart failure. Irregular heartbeat. High blood pressure. The goal of treatment is to help you breathe normally again. What are the causes?  The most common cause of this condition is a collapsed or blocked airway. There are three kinds of sleep apnea: Obstructive sleep apnea. This is caused by a blocked or collapsed airway. Central sleep apnea. This happens when the brain does not send the right signals to the muscles that control breathing. Mixed sleep apnea. This is a combination of obstructive and central sleep apnea. What increases the risk? Being overweight. Smoking. Having a small airway. Being older. Being female. Drinking alcohol. Taking medicines to calm yourself (sedatives or tranquilizers). Having family members with the condition. Having a tongue or tonsils that are larger than normal. What are the signs or symptoms? Trouble staying asleep. Loud snoring. Headaches in the morning. Waking up gasping. Dry mouth or sore throat in the morning. Being sleepy or tired during the day. If you are sleepy or tired during the day, you may also: Not be able to focus your mind (concentrate). Forget things. Get angry a lot and have mood swings. Feel sad (depressed). Have changes in your personality. Have less interest in sex, if you are female. Be unable to have an erection, if you are female. How is this treated?  Sleeping on your side. Using a medicine to get rid of mucus in your nose (decongestant). Avoiding the use of alcohol, medicines to help you relax, or certain pain medicines (narcotics). Losing weight, if needed. Changing your diet. Quitting smoking. Using a machine to open your airway while you sleep, such as: An oral appliance. This is a mouthpiece that shifts your lower jaw forward. A CPAP device. This device blows air through a mask when you breathe out (exhale). An EPAP device. This has valves that you put in each nostril. A BPAP  device. This device blows air through a mask when you breathe in (inhale) and breathe out. Having surgery if other treatments do not work. Follow these instructions at home: Lifestyle Make changes that your doctor recommends. Eat a healthy diet. Lose weight if needed. Avoid alcohol, medicines to help you relax, and some pain medicines. Do not smoke or use any products that contain nicotine or tobacco. If you need help quitting, ask your doctor. General instructions Take over-the-counter and prescription medicines only as told by your doctor. If you were given a machine to use while you sleep, use it only as told by your doctor. If you are having surgery, make sure to tell your doctor you have sleep apnea. You may need to bring your device with you. Keep all follow-up visits. Contact a doctor if: The machine that you were given to use during sleep bothers you or does not seem to be working. You do not get better. You get worse. Get help right away if: Your chest hurts. You have trouble breathing in enough air. You have an uncomfortable feeling in your back, arms, or stomach. You have trouble talking. One side of your body feels weak. A part of your face is hanging down. These symptoms may be an emergency. Get help right away. Call your local emergency services (911 in the [place]  Do not wait to see if the symptoms will go away. Do not drive yourself to the hospital. Summary This condition affects breathing during sleep. The most common cause is a collapsed or blocked airway. The goal of treatment is to help you breathe normally while you sleep. This information is not intended to replace advice given to you by your health care provider. Make sure you discuss any questions you have with your healthcare provider. Document Revised: 09/05/2020 Document Reviewed: 09/05/2020 Elsevier Patient Education  2022 [employer].

## 2021-06-02 NOTE — Progress Notes (Signed)
SLEEP MEDICINE CLINIC   Provider:  Larey Seat, M D  Referring Provider: Deland Pretty, MD Primary Care Physician:  Deland Pretty, MD  Chief Complaint  Patient presents with   Follow-up    RM 10, alone. Initial cpap follow up. Has full face mask. Uses for 4 hours each night. Headaches are resolved. Not getting good quality sleep still.    Interval History:  Katie [name] is a 55 y.o. female patient who had been seen at [facility] and is,now re- referred by Dr Shelia Media. Dizziness and sharp headaches. RV 06-02-2021: Katie [name] was last seen on 4 19-22 and had a procedure with a diagnosis of primary pulmonary hypertension on 02-1621.  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  She had a pericardial effusion and has a history of coronary artery disease pulmonary hypertension.   Her home sleep test was performed on 02-25-2021 and showed a sleep time of 7 hours with 60% REM sleep, her AHI was astronomically high at 86 apneas and hypopneas per hour.  The maximum oxygen saturation was 98% but the nadir was only 78% she had frequent short oxygen saturations but the total desaturation time was only 2.6 minutes.  She was prescribed a CPAP with 6 to 18 cm auto titration spectrum pressure 3 cm EPR and a mask of her choice.  The goal was to follow her today and see how she has been doing the 95th percentile pressure for this patient has been 7 cmH2O the residual AHI is only 3.7/h which is a good resolution so she she has been 90% compliant which  gives 27 out of 30 days of use was 26 of those days over 4 hours and an average use at time on days used of 5.3 hours.  The air leak that she initially presented with [place] in June has much decreased to the months of July. She switched to a FFM, F 20 Resmed.   She continues to use oxygen at 2 L/min bled into her auto CPAP machine. She has not found to have any more headaches  (!)but remains as fatigued and sleepy as before. The apnea is not promoting this hypersomnia or fatigue any longer, last month download of CPAP showed a residual AHI of 0.0/h on the new FFM !  She is still having insomnia:  her son is 68 and in 7th grade.      HPI:  The patient was originally seen here for recurrent dizziness and insomnia she had completed a work-up with ENT for vertigo -which was ruled out, she had a sleep study with [place] 5 years ago.  In 2019 she had a concern about sharp and sudden onset of headaches which have resolved.  She has pulmonary hypertension and she states pulmonary fibrosis followed by Dr. Chase Caller her congestive heart failure has been followed by Dr. Daisy Floro, she still has trouble with paradoxical insomnia.with positive ANA N, mixed connective tissue disorder, history of mitral valve replacement, paroxysmal spells, Raynaud's syndrome, discoid lupus, restrictive lung disease nephrotic syndrome and systolic congestive heart failure.  She had been diagnosed with pulmonary hypertension and coronary artery disease as well as with peri-cardial effusion.    She has a recurrent sharp headache today, started again just weeks ago,  dizziness reported again.  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    She had a sleep study in 2017, none since.  At the time of her first sleep study I recommended for PCP a referral to a behavior or cognitive psychologist to help the patient learn and implement better sleep habits. She was never referred, she stated. She reports the same problems with insomnia, she did not fill  out Epworth score ,nor FSS.  She believes not to snore. 2-3 nocturias each night. Bed time is variable.  She tries for 9 PM to start getting ready for bed.  The earlier she gets to bed, the earlier she wakes up.  All started with prednisone therapy- she is still taking prednisone. She is mother of a 6th grader, and reports her son is doing well.    12-06-2017 I had the pleasure of meeting Katie [name] about 18 months ago when she was referred by her ENT specialist Dr. Erik Obey and her primary care physician Dr. Trilby Drummer.  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  She had been diagnosed with pulmonary hypertension and coronary artery disease as well as with peri-cardial effusion.   She was not excessively sleepy in daytime but felt that her extremities were rather to fall asleep and stay asleep. The patient is also mother of an 51-year-old son, and his main caretaker.  At the time of her first sleep study I recommended for PCP a referral to a behavior or cognitive psychologist to help the patient learn and implement better sleep habits. She was never referred, she stated.  She reports the same problems with insomnia, she did not fill out Epworth score ,nor FSS.  She believes not to snore. 2-3 nocturias each night. Bed time is variable.  She tries for 9 PM to start getting ready for bed.  The earlier she gets to bed, the earlier she wakes up.  All started with prednisone therapy.  I reviewed her sleep study from 20 May 2016, which has revealed only an AHI of 2.9/h no additional respiratory disturbances, REM AHI was 17.9, respiratory rate was 19/min no evidence of CO2 retention, no overall increased total time in  desaturation.  Snoring was very mild.   There were no significant periodic limb movements of sleep noted.   EKG was a normal sinus rhythm, occasional for the non-restorative nature of the patient's sleep could not be identified.   Her sleep was fragmented as we could see in the sleep lab with a sleep efficiency of only 67%,  took a 42 minutes to go to sleep and during the sleep time she woke up many times.  Her spells  were clearly non-vertigenous, and we ruled out TIAs or seizures. She needs to cut out caffeine and increase oral hydration.      05-2016 , CD consult Previously seen here as a referral from Jodi Marble, MD, who referred this patient because of excessive daytime fatigue and dizziness feeling tired all the time. The patient has an extensive past medical history she underwent a mitral  valve replacement and developed a cardiac tamponade, she carries a diagnosis of congestive heart failure, pulmonary hypertension due to high resistance, Raynaud's syndrome, systemic lupus erythematosus with pulmonary lupus, peripheral artery obstruction disease, vascular necrosis of the bones, she was referred by Dr. Thressa Sheller her primary care physician to Dr. Erik Obey. She begun feeling in May 2017 episodes of sudden onset of dizziness, described as a lightheadedness and followed by a sudden feeling of severe tiredness and fatigue. She could just fall asleep she felt so fatigued and let, heavy without any strength left in her. The episodes lasted less than 30 seconds that could happen several time each day. 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 She has no history of traumatic brain injury in also throat trauma or previous strokes. There is no vestibulitis present or symptom of vestibulitis she never had a spinning sensation or the feeling of being in motion and actually at rest. The spells only happen when she drives. Mornings and afternoons. Unrelated  to food intake times or sleep.  Insomnia since 1990 , ever since she started on prednisone for Lupus.  Sleep habits are as follows:She usually goes to bed between 10:30 and 11 PM she lives with her son. After she goes to bed it takes her about 90 minutes 220 minutes to go to sleep. She keeps her bedroom cool, quiet and dark, she sleeps on 2 pillows she usually prefers to sleep prone. She does not suffer shortness of breath at night, palpitations have never woken her from her sleep is fragmented at least twice at night when she has the urge to urinate since she is on diuretics. It is very difficult for her to go back to sleep after these interruptions. Her total sleep time may be as little as 4 hours at night. There is no witness to her sleep pattern and her son cannot  state if she snores or if she has apnea. She frequently wakes up with headaches but the headaches do not wake her, they do not have an episodic cluster character. She has to arise at 5 former work as a Teacher, early years/pre but she usually is awake long before that. She does not wake up with a dry mouth but has headaches, she has not woken up with unexplained injuries  Or bruises, incontinence or tongue bite. She recalls dreaming, vividly.   Sleep medical history and family sleep history:  She has no history of sleepwalking, enuresis, night terrors.  Social history: Katie [name] is single, she is a 26-year-old son and works as a Teacher, early years/pre. She was diagnosed at age 21 with lupus. Her lupus did not flareup in her late pregnancy. 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   Review of Systems: Out of a complete 14 system review, the patient complains of only the following symptoms, and all other reviewed systems are negative.   hoarse,  Coughing, cold, Insomnia, sleep deprivation, School bus driver, morning headaches, discoid lupus,  pulmonary hypertension, dizzy spells.   LUPUS: has been a fatiguing  disease.    In 05-2016 -Epworth score 7 , Fatigue severity score 63  , depression score 2/15   How likely are you to doze in the following situations: 0 = not likely, 1 = slight chance, 2 = moderate chance, 3 = high chance  Sitting and Reading? Watching Television? Sitting inactive in a public place (theater or meeting)? Lying down  in the afternoon when circumstances permit? 2 Sitting and talking to someone? Sitting quietly after lunch without alcohol?1 In a car, while stopped for a few minutes in traffic? 3 As a passenger in a car for an hour without a break?  Total =6   Fatigue : 45 points/63    Social History   Socioeconomic History   Marital status: Single    Spouse name: Not on file   Number of children: Not on file   Years of education: Not on file   Highest education level: Not on file  Occupational History   Not on file  Tobacco Use   Smoking status: Never   Smokeless tobacco: Never  Substance and Sexual Activity   Alcohol use: No   Drug use: No   Sexual activity: Not Currently    Birth control/protection: Other-see comments    Comment: BTL  Other Topics Concern   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    Social Determinants of Health   Financial Resource Strain: Not on file  Food Insecurity: Not on file  Transportation Needs: Not on file  Physical Activity: Not on file  Stress: Not on file  Social Connections: Not on file  Intimate Partner Violence: Not on file    Family History  Problem Relation Age of Onset   Emphysema Mother        smoker   COPD Mother    Heart disease Maternal Grandmother        side of family not given   Diabetes Maternal Grandmother    Hypertension Father    Osteoarthritis Father    Sleep apnea Father    Breast cancer Maternal Aunt     Past Medical History:  Diagnosis Date   Abscess of right breast    sternal osteomyeliitis, pseudomonas   C.  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    Cataract    Mixed OU   CHF (congestive heart failure) (HCC)    secondary to mmoderate mitral stenosis and moderate mitral regurgitation in the setting of rheumatic vlave disease as well as severe pulmonary HTN likely due to a combination of MV disease and pulmoanary arterial HTN in the setting of collagen vascular disease. Not valvuloplasty candidate due to MR. patient had MV replacement with mechanical MV in 8/11.   CHF (congestive heart failure) (HCC)    Echo post-op showed normally functioning mechanical MV, EF 60-65% with septal bounce, small mobile structure in the mid ventricle (? loose papillary muscle), RV-RA gradient 22 mmHg. Echo (10/11): EF 60-65%, normally functioning mechanical mitral vlavue.    Hypertensive retinopathy    OU   Mixed connective tissue disease ([place])    diagnosis 1990. 8/10 ANA positive, anti-dDNA positive, anti-SCL70 negative   Nephrotic syndrome    secondary to SLE   Pulmonary HTN ([place])    porbably secondary to combination of rheumatic MV disease and pulmonary arterial HYN form vollagen vascalr disease. CTA chest (7/10) with no evidence for pulmonary emboli or chronic pulmonary emboli. PA pressure 72/28 mean 50 with PVR 5.7 WU on initial RHC, PA pressure 45/21 mean 30 PVR 2.3 WU on f/u RHC on Revatio 80 mg three times a day (1/11).  After MV surgery, Revatio stopped.   Pulmonary HTN ([place])  increased  dyspnea. Repeat RHC with mean RA 6, PA 42/20 mean 29, PVR 4.8 WU,  2.3. Revatio 20 mg  3x a day restarted. 3/11 6 min walk 477m 2/12 6 min walk 427 m.    Raynaud's syndrome    Restrictive lung disease    secondary to MCTD. PFTs 03/06/99 VC 47% DLCO 26%. PFTs 09/12/08 VC 43% DLCO 45%. PFTs 6/10 FVC 37%, FEV1 34%, ratio 69%, TLC 50%. using home O2 periodically   Rheumatic fever     Warfarin anticoagulation    for mechanical MV, goal INR 2.5-3.5    Past Surgical History:  Procedure Laterality Date   BREAST CYST ASPIRATION  03/09/2012   CESAREAN SECTION     COLONOSCOPY     s/p mitral valvue replacement  05/13/2010   by Dr. ORoxy Mannsby minimally invasive window.    TUBAL LIGATION      Current Outpatient Medications  Medication Sig Dispense Refill   acetaminophen (TYLENOL) 325 MG tablet Take 650 mg by mouth. As directed as needed     ALPRAZolam (XANAX) 0.25 MG tablet For dizziness prn po. 30 tablet 0   aspirin 81 MG EC tablet Take 81 mg by mouth daily.     azaTHIOprine (IMURAN) 50 MG tablet Take 50 mg by mouth daily.      Belimumab (BENLYSTA) 200 MG/ML SOSY Inject 1 Dose into the skin once a week.     Calcium Carb-Cholecalciferol 600-200 MG-UNIT TABS 1 tablet with food     Calcium Citrate-Vitamin D (CALCIUM + D PO) Take by mouth.     Cyanocobalamin (B-12) 1000 MCG TABS 1 tablet     furosemide (LASIX) 20 MG tablet TAKE 1 TABLET DAILY 90 tablet 3   magnesium oxide (MAG-OX) 400 MG tablet Take 400 mg by mouth daily.     metoprolol succinate (TOPROL-XL) 25 MG 24 hr tablet TAKE 1/2 TABLET DAILY 45 tablet 3   Multiple Vitamin (MULTIVITAMIN) tablet Take 1 tablet by mouth daily.     potassium chloride SA (KLOR-CON) 20 MEQ tablet TAKE 1 TABLET TWICE A DAY 180 tablet 0   predniSONE (DELTASONE) 5 MG tablet Take 5 mg by mouth daily.     sildenafil (REVATIO) 20 MG tablet Take 1 tablet (20 mg total) by mouth 3 (three) times daily. 270 tablet 3   Tiotropium Bromide Monohydrate (SPIRIVA RESPIMAT) 2.5 MCG/ACT AERS Inhale 2 puffs into the lungs daily. 4 each 0   valACYclovir (VALTREX) 1000 MG tablet Take 1,000 mg by mouth as needed.     Vitamin D, Cholecalciferol, 400 units CAPS Take by mouth.     warfarin (COUMADIN) 5 MG tablet Take as directed by the anticoagulation clinic. (Patient taking differently: Take 5-7.5 mg by mouth See admin instructions. 7.'5mg'$  on Sun,Mon,Wed, Fri, Sat '5mg'$  on  Tues,Thursday) 135 tablet 3   No current facility-administered medications for this visit.    Allergies as of 06/02/2021 - Review Complete 05/05/2021  Allergen Reaction Noted   Cephalexin Other (See Comments) 04/22/2016   Methotrexate      Vitals: BP 118/79 (BP Location: Left Arm, Patient Position: Sitting, Cuff Size: Normal)   Pulse 82   Ht '5\' 4"'$  (1.626 m)   Wt 190 lb (86.2 kg)   BMI 32.61 kg/m  Last Weight:  Wt Readings from Last 1 Encounters:  06/02/21 190 lb (86.2 kg)   [id number] index is 32.61 kg/m.     Last Height:   Ht Readings from Last 1 Encounters:  06/02/21 '5\' 4"'$  (  1.626 m)    Physical exam:  General: The patient is awake, alert and appears not in acute distress. The patient is well groomed. Head: Normocephalic, atraumatic. Neck is supple. Mallampati 3,   neck circumference: 14"  Nasal airflow patent , TMJ is is  evident , restricted mouth opening.Retrognathia is not seen. No biological teeth.    Cardiovascular:  Regular rate and rhythm, without  murmurs or carotid bruit, and without distended neck veins. Respiratory: Lungs are clear to auscultation. Skin:  With evidence of right sided ankle  edema,  or rash- facial scarring form lupus discoides Trunk:  The patient's posture is erect    Neurologic exam : The patient is awake and alert, oriented to place and time.   Attention span & concentration ability appears normal.  Speech is fluent,  with dysphonia.Her upper lip is stiffened by lupoid skin changes  Mood and affect are appropriate.  Cranial nerves: Taste and smell are preserved. Pupils are equal and reactive to light.  She has strabismus, but reports no diplopia. Reports no history of lazy eye.  She is bothered by bright lights, -bilaterally cataracts developed Extraocular movements in vertical and horizontal planes intact , without nystagmus. Visual fields by finger perimetry are intact. Hearing to finger rub intact. Facial sensation intact to  fine touch.Facial motor strength is symmetric -and tongue and uvula move midline. Perioral tissue is changing.  Shoulder shrug was symmetrical.   Motor exam:  Normal tone, muscle bulk and symmetric strength in all extremities. She has reduced grip strength, trouble to open a jar and no changes in handwriting.  Sensory:  Fine touch and vibration were tested as normal. Coordination: Finger-to-nose maneuver normal without evidence of ataxia, dysmetria or tremor. Gait and station: Patient walks without assistive device .  Strength within normal limits. Stance is stable and normal.   Deep tendon reflexes: in the upper and lower extremities are symmetric and intact.   The patient was advised of the nature of the diagnosed sleep disorder , the treatment options and risks for general a health and wellness arising from not treating the condition.  I spent more than 35 minutes of face to face time with the patient and confirming her history making necessary changes.  Greater than 50% of time was spent in counseling and coordination of care. We have discussed the diagnosis and differential and I answered the patient's questions.   Katie [name] spells could indeed be related to sleep deprivation . Her attended sleep study has not shown any significant organic sleep disorder-     Assessment:  After physical and neurologic examination, review of laboratory studies,  Personal review of imaging studies, reports of other /same  Imaging studies ,  Results of polysomnography/ neurophysiology testing and pre-existing records as far as provided in visit., my assessment is :  1)Walking test documented drop in oxygen (per Pulmonology), she has to remain on oxygen and CPAP.   2) resolution of sudden sharp headaches or dizziness - since being on CPAP and 02. sudden dizziness comes with fatigue, too.has improved, fatigue has not improved.   Plan:  Treatment plan and additional workup : Insomnia , multifactorial-   Severe OSA and CSA- very well resolved: AHI was 0.0/h on FFM. Fatigue related to autoimmune diseases.  Oxygen dependent.    RV with NP in 12 month, epworth, neck size, recent labs and o2 need documentation.    Katie Partridge Agustine Rossitto MD  06/02/2021   CC: Loralie Champagne, MD Cardiology  CC; Mallie Mussel, MD  Cardio- Thoracic surgery   [place], MD, Pulmonology    Deland Pretty, [place] [place],  [place] [id number]

## 2021-06-11 ENCOUNTER — Encounter: Payer: Self-pay | Admitting: Internal Medicine

## 2021-06-11 ENCOUNTER — Ambulatory Visit: Payer: BC Managed Care – PPO | Admitting: Internal Medicine

## 2021-06-11 ENCOUNTER — Other Ambulatory Visit: Payer: Self-pay

## 2021-06-11 VITALS — BP 108/64 | HR 102 | Temp 97.6°F | Ht 64.0 in | Wt 189.0 lb

## 2021-06-11 DIAGNOSIS — R942 Abnormal results of pulmonary function studies: Secondary | ICD-10-CM | POA: Diagnosis not present

## 2021-06-11 DIAGNOSIS — D84821 Immunodeficiency due to drugs: Secondary | ICD-10-CM

## 2021-06-11 DIAGNOSIS — Z79899 Other long term (current) drug therapy: Secondary | ICD-10-CM

## 2021-06-11 DIAGNOSIS — Z8669 Personal history of other diseases of the nervous system and sense organs: Secondary | ICD-10-CM

## 2021-06-11 DIAGNOSIS — R0609 Other forms of dyspnea: Secondary | ICD-10-CM

## 2021-06-11 DIAGNOSIS — R0600 Dyspnea, unspecified: Secondary | ICD-10-CM | POA: Diagnosis not present

## 2021-06-11 DIAGNOSIS — J439 Emphysema, unspecified: Secondary | ICD-10-CM

## 2021-06-11 DIAGNOSIS — M329 Systemic lupus erythematosus, unspecified: Secondary | ICD-10-CM

## 2021-06-11 MED ORDER — SPIRIVA RESPIMAT 2.5 MCG/ACT IN AERS: 2.0000 | INHALATION_SPRAY | Freq: Every day | RESPIRATORY_TRACT | 5 refills | Status: DC

## 2021-06-11 NOTE — Patient Instructions (Addendum)
ICD-10-CM   1. Dyspnea on exertion  R06.00     2. Abnormal PFT  R94.2     3. Pulmonary emphysema, unspecified emphysema type ([place])  J43.9     4. Immunosuppression due to drug therapy ([place])  D84.821    Z79.899     5. Personal history of systemic lupus erythematosus (SLE) (HCC)  M32.9     6. History of obstructive sleep apnea  Z86.69      Coughf, fatguge and wking desaturation test improved significantly after spiriva But no improvement in shortness of breath which can be (in addition to emphysema) due to weight, physical deconditioning and stiff heart muscle   Plan  - continue spiriva daily with albuterol as needed  - refer pulmonary rehab -SLE management per Dr. Kathlene November (Imuran, Benlysta and prednisone] - Pulmonary hypertension management per Dr. Corrie Dandy and Lasix] -Sleep apnea management per Dr. Wilber Bihari at night)  Follow-up -Do spirometry and DLCO in 3-4 months - Return in 3-4 months; symptom score and simple walking desaturation test at follow-up  -Timing of follow-up CT chest to be decided at follow-up  - check alpha 1 at followu

## 2021-06-11 NOTE — Progress Notes (Signed)
OV 11/19/2020  Subjective:  Patient ID: Katie [name], female , DOB: 04-Dec-1965 , age 55 y.o. , MRN: [id number] , ADDRESS: [address] [place] [address] PCP Deland Pretty, MD Patient Care Team: Deland Pretty, MD as PCP - General (Internal Medicine)  This Provider for this visit: Treatment Team:  Attending Provider: Brand Males, MD    11/19/2020 -   Chief Complaint  Patient presents with   Consult    ILD consult. Chronic cough, SOB with exertion. Some wheezing     HPI Katie [name] 55 y.o. -presents for new consultation.  She is to see Dr. Christinia Gully but that was several years ago and therefore is a new consultation.  She has been referred by Dr. Loralie Champagne based on chart review and her history.  She has mitral valve prolapse and she says on a 6-minute walk test she desaturated.  And given her lupus she has been referred here.  She gives a history of SLE since early 24s.  She is currently on immunosuppressed with prednisone, Imuran and also Benlysta infusions.  She says lupus initially involved her kidneys but does not at this point in time.  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  Also involves the heart and is the basis of mitral valve repair.  She says she also has Raynaud's.  Overall she feels lupus is well controlled.  She works as a Teacher, early years/pre  In terms of pulmonary in 2012 she had CT scan of the chest that reported emphysema but no obvious description of fibrosis but chest x-rays in 2015 reported fibrosis she does not have any CT scan of the chest at that time.  Her last PFTs were in 2015 that is reviewed below and visualized the image myself.  It shows restriction with a low DLCO consistent with someone with fibrosis.  In terms of her symptoms she has stable dyspnea on exertion.  She works as a Teacher, early years/pre.  She has chronic cough that for which she believes she will benefit from any  symptom relief.  She has had a Covid vaccine but the response to the vaccine is not known.  There is no Covid IgG check so far.  She is open to using oxygen but does not want to use it at work.  She denies any mold exposure or bird exposure or feather dust exposure or having down pillow   Echocardiogram #2021  -Normal functioning mitral valve that was repaired.  Normal ejection fraction normal right ventricular function   Sleep study 2017   -Apnea-hypopnea index 2.7  Results for Katie, [name] (MRN [id number]) as of 11/19/2020 10:00  Ref. Range 06/10/2009 20:08  Anti Nuclear Antibody (ANA) Latest Ref Range: NEGATIVE  POS (A)  ANA Pattern 1 Unknown SPECKLED  ANA Titer 1 Latest Ref Range: <1:40  1:320 (H)  ds DNA Ab Latest Ref Range: <5  69 (H)  Scleroderma (Scl-70) (ENA) Antibody, IgG Latest Ref Range: <1.0  0.5     Walk test   Simple office walk 185 feet x  3 laps goal with forehead probe 11/19/2020   O2 used ra  Number laps completed 3  Comments about pace x  Resting Pulse Ox/HR 100% and 87/min  Final Pulse Ox/HR 88% and 110/min  Desaturated </= 88% yes  Desaturated <= 3% points yes  Got Tachycardic >/= 90/min yes  Symptoms at end of test x  Miscellaneous comments x   '  CT Chest data    PFT   Results for Katie, [name] (MRN [id number]) as of 11/19/2020 10:00  Ref. Range 08/19/2020 10:33  Creatinine Latest Ref Range: 0.44 - 1.00 mg/dL 0.69  Results for Katie, [name] (MRN [id number]) as of 11/19/2020 10:00  Ref. Range 08/19/2020 10:33  Hemoglobin Latest Ref Range: 12.0 - 15.0 g/dL 13.1   ECHO 09/02/2020  IMPRESSIONS     1. Left ventricular ejection fraction, by estimation, is 60 to 65%. The  left ventricle has normal function. The left ventricle has no regional  wall motion abnormalities. Left ventricular diastolic function could not  be evaluated.   2. Right ventricular systolic function was not well visualized. The right  ventricular size is normal. There is  normal pulmonary artery systolic  pressure. The estimated right ventricular systolic pressure is 99991111 mmHg.   3. Left atrial size was mildly dilated.   4. The mitral valve has been repaired/replaced. No evidence of mitral  valve regurgitation. No evidence of mitral stenosis. The mean mitral valve  gradient is 5.2 mmHg with average heart rate of 89 bpm. There is a  mechanical valve present in the mitral  position. Procedure Date: 05/19/2010. Echo findings are consistent with  normal structure and function of the mitral valve prosthesis.   5. The aortic valve is normal in structure. Aortic valve regurgitation is  not visualized. No aortic stenosis is present.   6. The inferior vena cava is normal in size with greater than 50%  respiratory variability, suggesting right atrial pressure of 3 mmHg.   Comparison(s): No significant change from prior study. Prior images  reviewed side by side.   FINDINGS   Left Ventricle: Left ventricular ejection fraction, by estimation, is 60  to 65%. The left ventricle has normal function. The left ventricle has no  regional wall motion abnormalities. The left ventricular internal cavity  size was normal in size. There is   no left ventricular hypertrophy. Abnormal (paradoxical) septal motion  consistent with post-operative status. Left ventricular diastolic function  could not be evaluated due to mitral valve replacement. Left ventricular  diastolic function could not be  evaluated.   Right Ventricle: The right ventricular size is normal. No increase in  right ventricular wall thickness. Right ventricular systolic function was  not well visualized. There is normal pulmonary artery systolic pressure.  The tricuspid regurgitant velocity is   2.34 m/s, and with an assumed right atrial pressure of 3 mmHg, the  estimated right ventricular systolic pressure is 99991111 mmHg.   Left Atrium: Left atrial size was mildly dilated.   Right Atrium: Right atrial size  was normal in size.   Pericardium: There is no evidence of pericardial effusion.   Mitral Valve: The mitral valve has been repaired/replaced. No evidence of  mitral valve regurgitation. There is a mechanical valve present in the  mitral position. Procedure Date: 05/19/2010. Echo findings are consistent  with normal structure and function  of the mitral valve prosthesis. No evidence of mitral valve stenosis. The  mean mitral valve gradient is 5.2 mmHg with average heart rate of 89 bpm.   Tricuspid Valve: The tricuspid valve is normal in structure. Tricuspid  valve regurgitation is mild . No evidence of tricuspid stenosis.   Aortic Valve: The aortic valve is normal in structure. Aortic valve  regurgitation is not visualized. No aortic stenosis is present.   Pulmonic Valve: The pulmonic valve was normal in structure. Pulmonic  valve  regurgitation is not visualized. No evidence of pulmonic stenosis.   Aorta: The aortic root is normal in size and structure.   Venous: The inferior vena cava is normal in size with greater than 50%  respiratory variability, suggesting right atrial pressure of 3 mmHg.   IAS/Shunts: No atrial level shunt detected by color flow      OV 02/05/2021  Subjective:  Patient ID: Katie [name], female , DOB: September 07, 1966 , age 49 y.o. , MRN: [id number] , ADDRESS: [address] [place] [address] PCP Deland Pretty, MD Patient Care Team: Deland Pretty, MD as PCP - General (Internal Medicine)  This Provider for this visit: Treatment Team:  Attending Provider: Brand Males, MD    02/05/2021 -   Chief Complaint  Patient presents with   Follow-up    Doing ok, breathing is the same     HPI Katie [name] 55 y.o. - returns for follow-up.  She has lupus immunosuppression restricted PFTs exertional hypoxemia and exertional dyspnea.  High concern for interstitial lung disease but she had high-resolution CT chest the chest shows emphysema.  She had  pulmonary function test that still shows restriction and low DLCO but it is stable compared to 2014-01-04.  I interviewed her.  Her mom used to be a heavy smoker and she died in January 04, 2005.  During this time patient was heavily exposed to cigarette smoke at home.  The current CT is similar to 01-05-11 many years ago which showed emphysema.  Patient is not using exertional oxygen because of poor customer service from [employer].  She does not want to be with that agency again.  She is willing to try other treatment for emphysema.  I had a to the ILD exposure questionnaire.  It appears that she has shortness of breath for 13 years gradually getting worse.  Symptom score is below.  She also has a chronic cough that is moderate in intensity and is been going on since 2008-01-05.  She does have lupus she does have Raynaud's.  She has fatigue she has arthralgia she is really not she does have some snoring.  Family history is positive for emphysema in her mother.  Organic and inorganic antigen exposure history is negative    CT Chest data 12/01/20   IMPRESSION: 1. Emphysema (ICD10-J43.9) with scattered pulmonary parenchymal scarring. No definitive evidence of superimposed interstitial lung disease. 2. Aortic atherosclerosis (ICD10-I70.0). Coronary artery calcification.     Electronically Signed   By: Lorin Picket M.D.   On: 12/01/2020 13:59     OV 06/11/2021  Subjective:  Patient ID: Katie [name], female , DOB: 08/15/1966 , age 85 y.o. , MRN: [id number] , ADDRESS: [address] [place]  [address] PCP Deland Pretty, MD Patient Care Team: Deland Pretty, MD as PCP - General (Internal Medicine)  This Provider for this visit: Treatment Team:  Attending Provider: Brand Males, MD    06/11/2021 -   Chief Complaint  Patient presents with   Follow-up    Pt states she has been doing okay since last visit. States that she still is becoming SOB with exertion.    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 Brett Fairy on CPAP at night since spring 2022   -Original sleep study 01/05/2016 -May  2022 sleep studyL: 02-25-2021 and showed a sleep time of 7 hours with 60% REM sleep, her AHI was astronomically high at 86 apneas and hypopneas per hour.  The maximum oxygen saturation was 98% but the nadir was only 78% she had frequent short oxygen saturations but the total desaturation time was only 2.6 minutes.  She was prescribed a CPAP with 6 to 18 cm au  -Last evaluation August 2022 by Dr. 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 Loralie Champagne.  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 Loralie Champagne  -CT angiogram chest July 2010 with no evidence of PE  -Status post mitral valve replacement in 2011  -On mechanical anticoagulation  -Unclear date of most recent cardiac cath but apparently improved since mitral valve replacement per Dr. Aundra Dubin notes November 2021  -Most recent echo November [id number] with RV systolic pressure 25 and normal EF left ventricle.  Unable to estimate diastolic function  -Restricted PFTs on evaluation but CT scan of the chest 2022 with emphysema and strong history of passive smoking   -No ILD on CT chest`  -Started Spiriva April 2022 HPI Katie [name] 56 y.o. -presents for follow-up.  She was originally referred because of concern of lupus related interstitial lung  disease because of the presence of lupus, immunosuppression and restrictive PFTs.  However admitted high-resolution CT chest earlier this year there is only emphysema.  She gave a strong history of passive smoking.  So when I saw her in April 2022 started on Spiriva.  She is here for follow-up.  She tells me that the fatigue and cough is significantly improved with Spiriva but shortness of breath persists.  She says overall shortness of breath is stable but looking at the score shortness of breath might be worse.  Review of the chart indicate that in the interim Dr. Aundra Dubin is the main increase Lasix for her.  There is no active smoking or passive smoking currently.  She has never been to pulmonary rehabilitation.  She is using CPAP since spring 2022.  She is also using oxygen at night.     SYMPTOM SCALE - emphysema, luspus 02/05/2021  06/11/2021 spiriva  O2 use ra ra  Shortness of Breath 0 -> 5 scale with 5 being worst (score 6 If unable to do)   At rest 0 0  Simple tasks - showers, clothes change, eating, shaving 0 1  Household (dishes, doing bed, laundry) 1 3  Shopping 3 4  Walking level at own pace 4 5  Walking up Stairs 5 5  Total (30-36) Dyspnea Score 13 18  How bad is your cough? 5 2  How bad is your fatigue 5 3  How bad is nausea 0 0  How bad is vomiting?  0 0  How bad is diarrhea? 0 0  How bad is anxiety? 3 2  How bad is depression 2 2      Walk test  Simple office walk 185 feet x  3 laps goal with forehead probe 11/19/2020  06/11/2021   O2 used ra ra  Number laps completed 3 3  Comments about pace x avg  Resting Pulse Ox/HR 100%  and 87/min 100% and 102  Final Pulse Ox/HR 88% and 110/min 95% and 130  Desaturated </= 88% yes no  Desaturated <= 3% points yes Yes 5 ponts  Got Tachycardic >/= 90/min yes Yes tachy even at rest  Symptoms at end of test x Moderate dyspnea  Miscellaneous comments x       PFT  PFT Results Latest Ref Rng & Units 12/05/2020 08/16/2014 02/08/2014   FVC-Pre L 1.17 1.16 1.14  FVC-Predicted Pre % 41 39 38  FVC-Post L 1.27 1.25 1.16  FVC-Predicted Post % 44 42 39  Pre FEV1/FVC % % 72 73 71  Post FEV1/FCV % % 69 76 78  FEV1-Pre L 0.84 0.85 0.82  FEV1-Predicted Pre % 37 35 34  FEV1-Post L 0.88 0.95 0.90  DLCO uncorrected ml/min/mmHg 9.57 11.83 11.54  DLCO UNC% % 45 48 47  DLVA Predicted % 102 104 99  TLC L 3.28 3.23 3.04  TLC % Predicted % 64 63 60  RV % Predicted % 98 112 108       has a past medical history of Abscess of right breast, C. difficile diarrhea (1/12), CAD (coronary artery disease), Cardiac tamponade, Cataract, CHF (congestive heart failure) ([place]), CHF (congestive heart failure) ([place]), Hypertensive retinopathy, Mixed connective tissue disease ([place]), Nephrotic syndrome, Pulmonary HTN ([place]), Pulmonary HTN ([place]), Raynaud's syndrome, Restrictive lung disease, Rheumatic fever, and Warfarin anticoagulation.   reports that she has never smoked. She has never used smokeless tobacco.  Past Surgical History:  Procedure Laterality Date   BREAST CYST ASPIRATION  03/09/2012   CESAREAN SECTION     COLONOSCOPY     s/p mitral valvue replacement  05/13/2010   by Dr. Roxy Manns by minimally invasive window.    TUBAL LIGATION      Allergies  Allergen Reactions   Cephalexin Other (See Comments)    HIVES   Methotrexate     REACTION: intolerance    Immunization History  Administered Date(s) Administered   Influenza Split 09/22/2013, 07/11/2014   Influenza,inj,Quad PF,6+ Mos 05/31/2017, 07/15/2018   Influenza-Unspecified 07/25/2012, 08/22/2013, 07/29/2014, 07/17/2015, 07/06/2016   PFIZER(Purple Top)SARS-COV-2 Vaccination 06/23/2020   Pneumococcal Conjugate-13 01/04/2018   Pneumococcal Polysaccharide-23 07/23/2009, 05/10/2018   Tdap 02/10/2011   Unspecified SARS-COV-2 Vaccination 12/10/2019    Family History  Problem Relation Age of Onset   Emphysema Mother        smoker   COPD Mother    Heart disease Maternal Grandmother         side of family not given   Diabetes Maternal Grandmother    Hypertension Father    Osteoarthritis Father    Sleep apnea Father    Breast cancer Maternal Aunt      Current Outpatient Medications:    acetaminophen (TYLENOL) 325 MG tablet, Take 650 mg by mouth. As directed as needed, Disp: , Rfl:    ALPRAZolam (XANAX) 0.25 MG tablet, For dizziness prn po., Disp: 30 tablet, Rfl: 0   aspirin 81 MG EC tablet, Take 81 mg by mouth daily., Disp: , Rfl:    azaTHIOprine (IMURAN) 50 MG tablet, Take 50 mg by mouth daily. , Disp: , Rfl:    Belimumab (BENLYSTA) 200 MG/ML SOSY, Inject 1 Dose into the skin once a week., Disp: , Rfl:    Calcium Carb-Cholecalciferol 600-200 MG-UNIT TABS, 1 tablet with food, Disp: , Rfl:    Cyanocobalamin (B-12) 1000 MCG TABS, 1 tablet, Disp: , Rfl:    furosemide (LASIX) 20 MG tablet, TAKE 1 TABLET DAILY,  Disp: 90 tablet, Rfl: 3   magnesium oxide (MAG-OX) 400 MG tablet, Take 400 mg by mouth daily., Disp: , Rfl:    metoprolol succinate (TOPROL-XL) 25 MG 24 hr tablet, TAKE 1/2 TABLET DAILY, Disp: 45 tablet, Rfl: 3   Multiple Vitamin (MULTIVITAMIN) tablet, Take 1 tablet by mouth daily., Disp: , Rfl:    potassium chloride SA (KLOR-CON) 20 MEQ tablet, TAKE 1 TABLET TWICE A DAY, Disp: 180 tablet, Rfl: 0   predniSONE (DELTASONE) 5 MG tablet, Take 5 mg by mouth daily., Disp: , Rfl:    sildenafil (REVATIO) 20 MG tablet, Take 1 tablet (20 mg total) by mouth 3 (three) times daily., Disp: 270 tablet, Rfl: 3   valACYclovir (VALTREX) 1000 MG tablet, Take 1,000 mg by mouth as needed., Disp: , Rfl:    Vitamin D, Cholecalciferol, 400 units CAPS, Take by mouth., Disp: , Rfl:    warfarin (COUMADIN) 5 MG tablet, Take as directed by the anticoagulation clinic. (Patient taking differently: Take 5-7.5 mg by mouth See admin instructions. 7.'5mg'$  on Sun,Mon,Wed, Fri, Sat '5mg'$  on Tues,Thursday), Disp: 135 tablet, Rfl: 3   Tiotropium Bromide Monohydrate (SPIRIVA RESPIMAT) 2.5 MCG/ACT AERS, Inhale 2  puffs into the lungs daily., Disp: 4 g, Rfl: 5      Objective:   Vitals:   06/11/21 1108  BP: 108/64  Pulse: (!) 102  Temp: 97.6 F (36.4 C)  TempSrc: Oral  SpO2: 100%  Weight: 189 lb (85.7 kg)  Height: '5\' 4"'$  (1.626 m)    Estimated body mass index is 32.44 kg/m as calculated from the following:   Height as of this encounter: '5\' 4"'$  (1.626 m).   Weight as of this encounter: 189 lb (85.7 kg).  '@WEIGHTCHANGE'$ @  [employer]   06/11/21 1108  Weight: 189 lb (85.7 kg)     Physical Exam   General: No distress. obese Neuro: Alert and Oriented x 3. GCS 15. Speech normal Psych: Pleasant Resp:  Barrel Chest - no.  Wheeze - no, Crackles - no, No overt respiratory distress CVS: Normal heart sounds. Murmurs - no Ext: Stigmata of Connective Tissue Disease -discoid lupus and active Raynaud's currently HEENT: Normal upper airway. PEERL +. No post nasal drip        Assessment:       ICD-10-CM   1. Dyspnea on exertion  R06.00 Pulmonary function test    2. Abnormal PFT  R94.2 Pulmonary function test    3. Pulmonary emphysema, unspecified emphysema type ([place])  J43.9     4. Immunosuppression due to drug therapy ([place])  D84.821    Z79.899     5. Personal history of systemic lupus erythematosus (SLE) ([facility])  M32.9     6. History of obstructive sleep apnea  Z86.69          Plan:     Patient Instructions     ICD-10-CM   1. Dyspnea on exertion  R06.00     2. Abnormal PFT  R94.2     3. Pulmonary emphysema, unspecified emphysema type ([place])  J43.9     4. Immunosuppression due to drug therapy ([place])  D84.821    Z79.899     5. Personal history of systemic lupus erythematosus (SLE) ([facility])  M32.9     6. History of obstructive sleep apnea  Z86.69      Coughf, fatguge and wking desaturation test improved significantly after spiriva But no improvement in shortness of breath which can be (in addition to emphysema) due to weight, physical  deconditioning and stiff heart muscle    Plan  - continue spiriva daily with albuterol as needed  - refer pulmonary rehab -SLE management per Dr. Kathlene November (Imuran, Benlysta and prednisone] - Pulmonary hypertension management per Dr. Corrie Dandy and Lasix] -Sleep apnea management per Dr. Wilber Bihari at night)  Follow-up -Do spirometry and DLCO in 3-4 months - Return in 3-4 months; symptom score and simple walking desaturation test at follow-up  -Timing of follow-up CT chest to be decided at follow-up    ( Level 05 visit: Estb 40-54 min   in  visit type: on-site physical face to visit  in total care time and counseling or/and coordination of care by this undersigned MD - Dr Brand Males. This includes one or more of the following on this same day 06/11/2021: pre-charting, chart review, note writing, documentation discussion of test results, diagnostic or treatment recommendations, prognosis, risks and benefits of management options, instructions, education, compliance or risk-factor reduction. It excludes time spent by the [facility] or office staff in the care of the patient. Actual time 73 min)   SIGNATURE    Dr. Brand Males, M.D., F.C.C.P,  Pulmonary and Critical Care Medicine Staff Physician, [facility] Director - Interstitial Lung Disease  Program  Pulmonary [place] [facility] [place], [place], [address]  Pager: [phone], If no answer or between  15:00h - 7:00h: call [phone] Telephone: [phone]  12:19 PM 06/11/2021

## 2021-06-12 NOTE — Addendum Note (Signed)
Addended by: Lorretta Harp on: 06/12/2021 09:41 AM   Modules accepted: Orders

## 2021-06-19 ENCOUNTER — Other Ambulatory Visit: Payer: Self-pay

## 2021-06-19 ENCOUNTER — Ambulatory Visit
Admission: RE | Admit: 2021-06-19 | Discharge: 2021-06-19 | Disposition: A | Discharge status: Home / Self Care | Payer: BC Managed Care – PPO | Source: Ambulatory Visit | Attending: Internal Medicine | Admitting: Internal Medicine

## 2021-06-19 DIAGNOSIS — Z1231 Encounter for screening mammogram for malignant neoplasm of breast: Secondary | ICD-10-CM

## 2021-06-22 ENCOUNTER — Encounter (HOSPITAL_COMMUNITY): Payer: Self-pay | Admitting: *Deleted

## 2021-06-22 NOTE — Progress Notes (Signed)
Received referral from Dr. Chase Caller  for this pt to participate in pulmonary rehab with the the diagnosis of ILD. Clinical review of pt follow up appt on  9/1 Pulmonary office note. Also reviewed notes from neuro and heart failure/pulmonary hypertension  Pt with Covid Risk Score - 6. Pt appropriate for scheduling for Pulmonary rehab.  Will forward to support staff for scheduling and verification of insurance eligibility/benefits with pt consent. Cherre Huger, BSN Cardiac and Training and development officer

## 2021-06-29 ENCOUNTER — Telehealth (HOSPITAL_COMMUNITY): Payer: Self-pay | Admitting: *Deleted

## 2021-06-29 NOTE — Telephone Encounter (Signed)
Received call from this pt regarding Pulmonary rehab. Katie [name] that her referral was received and will be scheduled at a later time due to the wait list. Reviewed with pt  expectations  and details regarding pulmonary rehab.  Cherre Huger, BSN Cardiac and Training and development officer

## 2021-06-30 ENCOUNTER — Encounter (HOSPITAL_COMMUNITY): Payer: Self-pay | Admitting: Cardiology

## 2021-06-30 ENCOUNTER — Ambulatory Visit (HOSPITAL_COMMUNITY)
Admission: RE | Admit: 2021-06-30 | Discharge: 2021-06-30 | Disposition: A | Discharge status: Home / Self Care | Payer: BC Managed Care – PPO | Source: Ambulatory Visit | Attending: Cardiology | Admitting: Cardiology

## 2021-06-30 ENCOUNTER — Other Ambulatory Visit: Payer: Self-pay

## 2021-06-30 VITALS — BP 100/58 | HR 95 | Wt 191.4 lb

## 2021-06-30 DIAGNOSIS — M351 Other overlap syndromes: Secondary | ICD-10-CM | POA: Insufficient documentation

## 2021-06-30 DIAGNOSIS — I5022 Chronic systolic (congestive) heart failure: Secondary | ICD-10-CM

## 2021-06-30 DIAGNOSIS — J439 Emphysema, unspecified: Secondary | ICD-10-CM | POA: Diagnosis not present

## 2021-06-30 DIAGNOSIS — Z8249 Family history of ischemic heart disease and other diseases of the circulatory system: Secondary | ICD-10-CM | POA: Diagnosis not present

## 2021-06-30 DIAGNOSIS — Z7951 Long term (current) use of inhaled steroids: Secondary | ICD-10-CM | POA: Diagnosis not present

## 2021-06-30 DIAGNOSIS — Z7952 Long term (current) use of systemic steroids: Secondary | ICD-10-CM | POA: Diagnosis not present

## 2021-06-30 DIAGNOSIS — N049 Nephrotic syndrome with unspecified morphologic changes: Secondary | ICD-10-CM | POA: Insufficient documentation

## 2021-06-30 DIAGNOSIS — Z7982 Long term (current) use of aspirin: Secondary | ICD-10-CM | POA: Insufficient documentation

## 2021-06-30 DIAGNOSIS — I059 Rheumatic mitral valve disease, unspecified: Secondary | ICD-10-CM | POA: Insufficient documentation

## 2021-06-30 DIAGNOSIS — R600 Localized edema: Secondary | ICD-10-CM | POA: Diagnosis not present

## 2021-06-30 DIAGNOSIS — Z79899 Other long term (current) drug therapy: Secondary | ICD-10-CM | POA: Diagnosis not present

## 2021-06-30 DIAGNOSIS — Z9989 Dependence on other enabling machines and devices: Secondary | ICD-10-CM | POA: Insufficient documentation

## 2021-06-30 DIAGNOSIS — G4733 Obstructive sleep apnea (adult) (pediatric): Secondary | ICD-10-CM | POA: Insufficient documentation

## 2021-06-30 DIAGNOSIS — Z09 Encounter for follow-up examination after completed treatment for conditions other than malignant neoplasm: Secondary | ICD-10-CM | POA: Diagnosis not present

## 2021-06-30 DIAGNOSIS — Z7901 Long term (current) use of anticoagulants: Secondary | ICD-10-CM | POA: Diagnosis not present

## 2021-06-30 DIAGNOSIS — I272 Pulmonary hypertension, unspecified: Secondary | ICD-10-CM | POA: Diagnosis not present

## 2021-06-30 DIAGNOSIS — Z952 Presence of prosthetic heart valve: Secondary | ICD-10-CM | POA: Insufficient documentation

## 2021-06-30 LAB — BASIC METABOLIC PANEL
Anion gap: 5 (ref 5–15)
BUN: 12 mg/dL (ref 6–20)
CO2: 30 mmol/L (ref 22–32)
Calcium: 9.1 mg/dL (ref 8.9–10.3)
Chloride: 107 mmol/L (ref 98–111)
Creatinine, Ser: 0.73 mg/dL (ref 0.44–1.00)
GFR, Estimated: >60 mL/min (ref >60)
Glucose, Bld: 99 mg/dL (ref 70–99)
Potassium: 4.3 mmol/L (ref 3.5–5.1)
Sodium: 142 mmol/L (ref 135–145)

## 2021-06-30 LAB — BRAIN NATRIURETIC PEPTIDE: B Natriuretic Peptide: 102.8 pg/mL — ABNORMAL HIGH (ref 0.0–100.0)

## 2021-06-30 MED ORDER — FUROSEMIDE 20 MG PO TABS: ORAL_TABLET | ORAL | 5 refills | Status: DC

## 2021-06-30 NOTE — Progress Notes (Signed)
Patient ID: CARRIANN HESSE, female   DOB: 09/12/1966, 55 y.o.   MRN: [id number] PCP: Dr. Shelia Media Cardiology: Dr . Aundra Dubin  55 y.o. 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    Echo in 1/19 showed EF 60-65%, mechanical mitral valve with mean gradient 5 mmHg, and PA systolic pressure 33 mmHg with normal RV.  Echo in 8/20 showed EF 50-55%, normal RV, mechanical MV with mean gradient 6 mmHg, unable to estimate PA systolic pressure, normal RV.  Echo in 11/21 showed EF 60-65%, RV probably normal, PASP 25 mmHg, mechanical MV with mean gradient 5 mmHg.   Patient reports chronic painful swelling in her legs, lower extremity venous ultrasound was negative for DVT.  She also has chronic lightheadedness, generally when she stands.  She feels like this and her light sensitivity is due to sildenafil.  Breathing much better since starting Spiriva.  She is short of breath if she walks fast but does ok walking at a steady pace on flat ground or walking up stairs.  No orthopnea/PND.  No chest pain.   ECG (personally reviewed): NSR, RBBB  Labs (8/11): HCT 27 => 30, creatinine 0.98, INR 4 Labs (9/11): K 4.6, creatinine 0.9, HCT 32.7, BNP 214=>146, LFTs normal, TSH normal Labs (1/12): HCT 37.9, K 3.5, creatinine 0.9 Labs (4/12): LDL 108, HDL 43, TSH normal, K 4.1, creatinine 0.8, HCT 37.9 Labs (1/13): K 3.8, creatinine 0.8 Labs (11/14): K 3.6, creatinine 0.71, LDL 94, HCT 38 Labs (12/15): Hgb 12.5 Labs (2/16): K 3.9, creatinine 0.84 Labs (8/16): K 3.9, creatinine 0.87, HCT 38.6 Labs (4/17): K 3.6, creatinine 0.88, LDL 92, HDL 38, hgb 11.7 Labs (10/17): K 3.6, creatinine 0.81, hgb 12.1 Labs (1/20): K 4.3, creatinine 0.79, LDL 107, hgb 12 Labs (6/21):  creatinine 0.7 Labs (7/22): BNP 24, K 4.3, creatinine 0.78  Allergies (verified):  1)  ! Methotrexate  Past Medical History: 1. Mixed connective tissue disease: diagnosis 1990. 8/10 ANA positive, anti-dsDNA positive, anti-SCL70 negative 2. 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     - PFT's 03/06/99  VC 47%  DLC0 26%    - PFT's 09/12/08  VC 43%  DLC0 45%    - PFT's 6/10 FVC 37%, FEV1 34%, ratio 69%, TLC 50%    - PFTs 11/15 FVC 39%, FEV1 35%, ratio 89%, TLC 63%, DLCO 48%    - CT chest in 2/22 showed emphysema but no ILD.  4. Nephrotic syndrome: secondary to MCTD 5. CHF: Secondary to moderate mitral stenosis and moderate mitral regurgitation in the setting of rheumatic mitral valve disease as well as severe pulmonary HTN likely due to a combination of MV disease and pulmonary arterial HTN in the setting of collagen vascular disease.   Not valvuloplasty candidate due to MR.  Patient had MV replacement with mechanical MV in 8/11.  Echo (8/11) post-op showed normally functioning mechanical MV, EF 60-65% with septal bounce, small mobile structure in the mid ventricle (? loose papillary muscle), RV-RA gradient 22 mmHg.  Echo (10/11): EF 60-65%, normally functioning mechanical mitral valve.  Echo (7/13): EF 55-60%, mechanical mitral valve with mean gradient 4 mmHg, PA systolic pressure 29 mmHg. Echo (5/15) with EF 55-60%, mechanical mitral valve functioning normally, normal RV size and systolic function, PA systolic pressure  32 mmHg.  - Echo in 5/17 showed EF 60-65%, normal mechanical mitral valve, and PA systolic pressure 33 mmHg with normal RV.  - Echo (1/19): EF 60-65%, mild LVH, normal RV size and systolic function, mechanical mitral valve with mean gradient 6 mmHg, PASP 33 mmHg.  - Echo (8/20): EF 50-55%, normal RV, mechanical MV with mean gradient 6 mmHg, unable to estimate PA systolic pressure, normal RV.  No change from prior.  - Echo (11/21): EF  60-65%, RV probably normal, PASP 25 mmHg, mechanical MV with mean gradient 5 mmHg. 6.  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  CTA chest (7/10) with no evidence for pulmonary emboli or chronic pulmonary emboli.  PA pressure 72/28 mean 50 with PVR 5.7 WU on initial RHC, PA pressure 45/21 mean 30 PVR 2.3 WU on f/u RHC on Revatio 80 mg three times a day (1/11).  After MV surgery, Revatio stopped.  Increased dyspnea.  Repeat RHC with mean RA 6, PA 42/20 (mean 29), PVR 4.8 WU, CI 2.3.  Revatio 20 mg three times a day restarted.  Echo (7/78) with PA systolic pressure 29 mmHg. Echo (2/42) with PA systolic pressure 32 mmHg.  - 3/11 6 min walk 427 m - 2/12 6 min walk 427 m - 1/13 6 min walk 411 m - 4/15 6 min walk 448 m - 2/16 6 min walk 421 m - 8/16 6 min walk 354 m - 9/17 6 min walk 436 m - 1/19 6 min walk 366 m - 10/19 6 min walk 402 m - 8/20 6 min walk 137 m (but walked slowly and stopped early due to knee pain).  7.  Rheumatic fever 8.  LHC (9/10): no angiographic CAD.  9.  Warfarin anticoagulation for mechanical MV, goal INR 2.5-3.5 10.  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  11. 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 C difficile diarrhea 1/12 13. GERD 14. Holter (1/19): No significant arrhythmias.  15. COPD: CT chest in 2/22 showed emphysema but no ILD.  Suspect due to passive smoking.   Family History: Emphysema mother, smoker.  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   ROS: All systems reviewed and negative except as noted in HPI.   Current Outpatient Medications  Medication Sig Dispense Refill   acetaminophen (TYLENOL) 325 MG tablet Take 650 mg by mouth. As directed as needed     aspirin 81 MG EC tablet Take 81 mg by mouth daily.     azaTHIOprine  (IMURAN) 50 MG tablet Take 50 mg by mouth daily.      Belimumab (BENLYSTA) 200 MG/ML SOSY Inject 1 Dose into the skin once a week.     Calcium Carb-Cholecalciferol 600-200 MG-UNIT TABS 1 tablet with food     Cyanocobalamin (B-12) 1000 MCG TABS 1 tablet     magnesium oxide (MAG-OX) 400 MG tablet Take 400 mg by mouth daily.     metoprolol succinate (TOPROL-XL) 25 MG 24 hr tablet TAKE 1/2 TABLET DAILY 45 tablet 3   Multiple Vitamin (MULTIVITAMIN) tablet Take 1 tablet by mouth daily.     potassium chloride SA (KLOR-CON) 20 MEQ tablet TAKE 1 TABLET TWICE A DAY 180 tablet 0   predniSONE (DELTASONE) 5 MG tablet Take 5 mg by mouth daily.     Tiotropium Bromide Monohydrate (SPIRIVA RESPIMAT) 2.5 MCG/ACT AERS Inhale  2 puffs into the lungs daily. 4 g 5   valACYclovir (VALTREX) 1000 MG tablet Take 1,000 mg by mouth as needed.     Vitamin D, Cholecalciferol, 400 units CAPS Take by mouth.     warfarin (COUMADIN) 5 MG tablet Take as directed by the anticoagulation clinic. (Patient taking differently: Take 5-7.5 mg by mouth See admin instructions. 7.5mg  on Sun,Mon,Wed, Fri, Sat 5mg  on Tues,Thursday) 135 tablet 3   furosemide (LASIX) 20 MG tablet Take 40mg  (2 tablets) by mouth alternating with 20mg  (1 tablet) by mouth daily. 135 tablet 5   No current facility-administered medications for this encounter.    BP (!) 100/58   Pulse 95   Wt 86.8 kg (191 lb 6.4 oz)   SpO2 98%   BMI 32.85 kg/m  General: NAD Neck: No JVD, no thyromegaly or thyroid nodule.  Lungs: Clear to auscultation bilaterally with normal respiratory effort. CV: Nondisplaced PMI.  Heart regular S1/S2 with mechanical S1, no S3/S4, 1/6 SEM RUSB.  1+ edema to knees.  No carotid bruit.  Normal pedal pulses.  Abdomen: Soft, nontender, no hepatosplenomegaly, no distention.  Skin: Intact without lesions or rashes.  Neurologic: Alert and oriented x 3.  Psych: Normal affect. Extremities: No clubbing or cyanosis.  HEENT: Normal.     Assessment/Plan:  1. Status post mechanical MVR:  Mitral valve replacement, stable from valve standpoint. Echo in 11/21 showed mean gradient 5 mmHg without significant MR.  She is on coumadin goal INR 2.5 - 3.5 and ASA 81 mg daily.   2.  Pulmonary HTN:  Pulmonary HTN is likely a mixed picture, from mitral valve disease as well as mixed connective tissue disease.  She had mild residual pulmonary HTN with moderately increased PVR even after mitral valve surgery on last RHC though this was improved compared to initial RHC.  The residual pulmonary HTN may be due to pulmonary vascular remodeling with chronic left atrial pressure elevation in the setting of mitral valve disease. Revatio was restarted at 20 mg tid.  Echo in 8/20 showed RV normal but unable to estimate PA systolic pressure.  Echo in 11/21 showed normal RV with PASP 25 mm Hg.  Symptomatically NYHA class I-II, she has uncomfortable leg edema though no JVD.  She feels like sildenafil is causing her orthostatic symptoms and her light sensitivity.  - With normal PA pressure and RV on last echo, will let her try coming off sildenafil.  After 1 month, I will repeat RHC.  If PA pressure is normal, she will stay off sildenafil.  If PA pressure is elevated, would consider trial of Opsumit.  Aim for INR around 2.5 for cath (goal has been 2.5-3.5).  - Increase Lasix to 40 mg daily alternating with 20 mg daily.  BMET/BNP today and again in 10 days.  - Wear graded compression stockings during the day.  - 6 minute walk today.  - Continue CPAP for OSA.  3. Mixed connective tissue disorder: Patient has history of pulmonary fibrosis, but most recent CT in 2/22 showed emphysema and no significant fibrosis. Followed by rheumatology at [facility].  - On prednisone and azathioprine.  4. COPD: Emphysema on 2/22 CT chest.  Thought to be due to passive smoking.  - She will continue Spiriva, has had symptomatic improvement.  5. OSA: Continue  CPAP.   Followup in 2 months.   Loralie Champagne 06/30/2021

## 2021-06-30 NOTE — Patient Instructions (Signed)
EKG done today.  Labs done today. We will contact you only if your labs are abnormal.  STOP taking Sildenafil  INCREASE Lasix to 40mg  (2 tablets) by mouth daily alternating with 20mg  (1 tablet) by mouth daily.  No other medication changes were made. Please continue all current medications as prescribed.  Please wear your compression hose daily, place them on as soon as you get up in the morning and remove before you go to bed at night. You were provided a prescription for these while in office. You can buy some at  [facility] located at [address], [place], [place] [address] [phone]   Your physician recommends that you schedule a follow-up appointment in: 3 weeks for a lab only appointment and in 2 months with Dr. Aundra Dubin.  If you have any questions or concerns before your next appointment please send [place] a message through [place] or call our office at [phone].    TO LEAVE A MESSAGE FOR THE NURSE SELECT OPTION 2, PLEASE LEAVE A MESSAGE INCLUDING: YOUR NAME DATE OF BIRTH CALL BACK NUMBER REASON FOR CALL**this is important as we prioritize the call backs  YOU WILL RECEIVE A CALL BACK THE SAME DAY AS LONG AS YOU CALL BEFORE 4:00 PM   Do the following things EVERYDAY: Weigh yourself in the morning before breakfast. Write it down and keep it in a log. Take your medicines as prescribed Eat low salt foods--Limit salt (sodium) to 2000 mg per day.  Stay as active as you can everyday Limit all fluids for the day to less than 2 liters   At the [facility], you and your health needs are our priority. As part of our continuing mission to provide you with exceptional heart care, we have created designated Provider Care Teams. These Care Teams include your primary Cardiologist (physician) and Advanced Practice Providers (APPs- Physician Assistants and Nurse Practitioners) who all work together to provide you with the care you need, when you need it.   You may  see any of the following providers on your designated Care Team at your next follow up: Dr Glori Bickers Dr Haynes Kerns, NP Lyda Jester, [place] Audry Riles, PharmD   Please be sure to bring in all your medications bottles to every appointment.    You are scheduled for a Cardiac Catheterization on Monday, October 31 with Dr. Loralie Champagne.  1. Please arrive at the [facility] (Main Entrance A) at [facility]: [address] [place], [place] [address] at 7:00 AM (This time is two hours before your procedure to ensure your preparation). Free valet parking service is available.   Special note: Every effort is made to have your procedure done on time. Please understand that emergencies sometimes delay scheduled procedures.  2. Diet: Do not eat solid foods after midnight.  The patient may have clear liquids until 5am upon the day of the procedure.  3. Labs: You will need to have blood drawn on Tuesday, October 11 at  You do not need to be fasting.  4. Medication instructions in preparation for your procedure:  The [facility] will be in contact with you regarding your Warfarin.   On the morning of your procedure DO NOT TAKE YOUR LASIX, take your Aspirin and any morning medicines NOT listed above.  You may use sips of water.  5. Plan for one night stay--bring personal belongings. 6. Bring a current list of your medications and current insurance cards.  7. You MUST have a responsible person to drive you home. 8. Someone MUST be with you the first 24 hours after you arrive home or your discharge will be delayed. 9. Please wear clothes that are easy to get on and off and wear slip-on shoes.  Thank you for allowing [place] to care for you!   -- [facility]

## 2021-07-01 ENCOUNTER — Encounter (INDEPENDENT_AMBULATORY_CARE_PROVIDER_SITE_OTHER): Payer: BC Managed Care – PPO | Admitting: Ophthalmology

## 2021-07-01 DIAGNOSIS — H40039 Anatomical narrow angle, unspecified eye: Secondary | ICD-10-CM

## 2021-07-01 DIAGNOSIS — H25813 Combined forms of age-related cataract, bilateral: Secondary | ICD-10-CM

## 2021-07-01 DIAGNOSIS — H35033 Hypertensive retinopathy, bilateral: Secondary | ICD-10-CM

## 2021-07-01 DIAGNOSIS — I10 Essential (primary) hypertension: Secondary | ICD-10-CM

## 2021-07-01 DIAGNOSIS — H3581 Retinal edema: Secondary | ICD-10-CM

## 2021-07-01 DIAGNOSIS — H43823 Vitreomacular adhesion, bilateral: Secondary | ICD-10-CM

## 2021-07-01 DIAGNOSIS — Z79899 Other long term (current) drug therapy: Secondary | ICD-10-CM

## 2021-07-09 ENCOUNTER — Ambulatory Visit: Payer: Self-pay | Admitting: Adult Health

## 2021-07-10 NOTE — Progress Notes (Signed)
[facility] Note  07/15/2021     CHIEF COMPLAINT Patient presents for Retina Follow Up   HISTORY OF PRESENT ILLNESS: Katie [name] is a 55 y.o. female who presents to the clinic today for:   HPI     Retina Follow Up   Patient presents with  Other.  In both eyes.  This started 7 months ago.  I, the attending physician,  performed the HPI with the patient and updated documentation appropriately.        Comments   Patient here for 7 months retina follow up for VMT OU. Patient states vision doing fine. No eye pain.       Last edited by Bernarda Caffey, MD on 07/15/2021 12:59 PM.    Pt states no changes in vision, she is on 5mg  PO Prednisone for lupus  Referring physician: Deland Pretty, MD [address] [place],  [place] [id number]  HISTORICAL INFORMATION:   Selected notes from the MEDICAL RECORD NUMBER Referred by Dr. Quentin Ore for concern of plaquenil toxicity LEE:  Ocular Hx- PMH-   CURRENT MEDICATIONS: No current outpatient medications on file. (Ophthalmic Drugs)   No current facility-administered medications for this visit. (Ophthalmic Drugs)   Current Outpatient Medications (Other)  Medication Sig   acetaminophen (TYLENOL) 325 MG tablet Take 650 mg by mouth. As directed as needed   aspirin 81 MG EC tablet Take 81 mg by mouth daily.   azaTHIOprine (IMURAN) 50 MG tablet Take 50 mg by mouth daily.    Belimumab (BENLYSTA) 200 MG/ML SOSY Inject 1 Dose into the skin once a week.   Calcium Carb-Cholecalciferol 600-200 MG-UNIT TABS 1 tablet with food   Cyanocobalamin (B-12) 1000 MCG TABS 1 tablet   furosemide (LASIX) 20 MG tablet Take 40mg  (2 tablets) by mouth alternating with 20mg  (1 tablet) by mouth daily.   KLOR-CON M20 20 MEQ tablet TAKE 1 TABLET TWICE A DAY   magnesium oxide (MAG-OX) 400 MG tablet Take 400 mg by mouth daily.   metoprolol succinate (TOPROL-XL) 25 MG 24 hr tablet TAKE 1/2 TABLET DAILY   Multiple Vitamin  (MULTIVITAMIN) tablet Take 1 tablet by mouth daily.   predniSONE (DELTASONE) 5 MG tablet Take 5 mg by mouth daily.   Tiotropium Bromide Monohydrate (SPIRIVA RESPIMAT) 2.5 MCG/ACT AERS Inhale 2 puffs into the lungs daily.   valACYclovir (VALTREX) 1000 MG tablet Take 1,000 mg by mouth as needed.   Vitamin D, Cholecalciferol, 400 units CAPS Take by mouth.   warfarin (COUMADIN) 5 MG tablet Take as directed by the anticoagulation clinic. (Patient taking differently: Take 5-7.5 mg by mouth See admin instructions. 7.5mg  on Sun,Mon,Wed, Fri, Sat 5mg  on Tues,Thursday)   No current facility-administered medications for this visit. (Other)   REVIEW OF SYSTEMS: ROS   Positive for: Musculoskeletal, Cardiovascular, Eyes Negative for: Constitutional, Gastrointestinal, Neurological, Skin, Genitourinary, HENT, Endocrine, Respiratory, Psychiatric, Allergic/Imm, Heme/Lymph Last edited by Theodore Demark, COA on 07/15/2021  9:14 AM.     ALLERGIES Allergies  Allergen Reactions   Cephalexin Other (See Comments)    HIVES   Methotrexate     REACTION: intolerance   PAST MEDICAL HISTORY Past Medical History:  Diagnosis Date   Abscess of right breast    sternal osteomyeliitis, pseudomonas   C. 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  Cataract    Mixed OU   CHF (congestive heart failure) (HCC)    secondary to mmoderate mitral stenosis and moderate mitral regurgitation in the setting of rheumatic vlave disease as well as severe pulmonary HTN likely due to a combination of MV disease and pulmoanary arterial HTN in the setting of collagen vascular disease. Not valvuloplasty candidate due to MR. patient had MV replacement with mechanical MV in 8/11.   CHF (congestive heart failure) (HCC)    Echo post-op showed normally functioning mechanical MV, EF 60-65% with  septal bounce, small mobile structure in the mid ventricle (? loose papillary muscle), RV-RA gradient 22 mmHg. Echo (10/11): EF 60-65%, normally functioning mechanical mitral vlavue.    Hypertensive retinopathy    OU   Mixed connective tissue disease ([place])    diagnosis 1990. 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 CTA chest (7/10) with no evidence for pulmonary emboli or chronic pulmonary emboli. PA pressure 72/28 mean 50 with PVR 5.7 WU on initial RHC, PA pressure 45/21 mean 30 PVR 2.3 WU on f/u RHC on Revatio 80 mg three times a day (1/11).  After MV surgery, Revatio stopped.   Pulmonary HTN (HCC)    increased  dyspnea. Repeat RHC with mean RA 6, PA 42/20 mean 29, PVR 4.8 WU,  2.3. Revatio 20 mg  3x a day restarted. 3/11 6 min walk 446m. 2/12 6 min walk 427 m.    Raynaud's syndrome    Restrictive lung disease    secondary to MCTD. PFTs 03/06/99 VC 47% DLCO 26%. PFTs 09/12/08 VC 43% DLCO 45%. PFTs 6/10 FVC 37%, FEV1 34%, ratio 69%, TLC 50%. using home O2 periodically   Rheumatic fever    Warfarin anticoagulation    for mechanical MV, goal INR 2.5-3.5   Past Surgical History:  Procedure Laterality Date   BREAST CYST ASPIRATION  03/09/2012   CESAREAN SECTION     COLONOSCOPY     s/p mitral valvue replacement  05/13/2010   by Dr. Roxy Manns by minimally invasive window.    TUBAL LIGATION      FAMILY HISTORY Family History  Problem Relation Age of Onset   Emphysema Mother        smoker   COPD Mother    Heart disease Maternal Grandmother        side of family not given   Diabetes Maternal Grandmother    Hypertension Father    Osteoarthritis Father    Sleep apnea Father    Breast cancer Maternal Aunt     SOCIAL HISTORY Social History   Tobacco Use   Smoking status: Never   Smokeless  tobacco: Never  Substance Use Topics   Alcohol use: No   Drug use: No       OPHTHALMIC EXAM:  Base Eye Exam     Visual Acuity (Snellen - Linear)       Right Left   Dist cc 20/30 -1 20/25 -1   Dist ph cc NI NI    Correction: Glasses         Tonometry (Tonopen, 9:12 AM)       Right Left   Pressure 18 16         Pupils       Dark Light Shape React APD   Right 3  2 Round Brisk None   Left 3 2 Round Brisk None         Visual Fields (Counting fingers)       Left Right    Full Full         Extraocular Movement       Right Left    Full, Ortho Full, Ortho  XT        Neuro/Psych     Oriented x3: Yes   Mood/Affect: Normal         Dilation     Both eyes: 1.0% Mydriacyl, 2.5% Phenylephrine @ 9:11 AM           Slit Lamp and Fundus Exam     Slit Lamp Exam       Right Left   Lids/Lashes Dermatochalasis - upper lid, mild Meibomian gland dysfunction Dermatochalasis - upper lid, mild Meibomian gland dysfunction   Conjunctiva/Sclera trace melanosis White and quiet   Cornea 2+ inferior Punctate epithelial erosions, trace tear film debris 1+Punctate epithelial erosions, trace tear film debris   Anterior Chamber Deep and clear, narrow temporal angle, No cell or flare Deep and clear, narrow temporal angle, No cell or flare   Iris Round and dilated Round and dilated   Lens 2+ Nuclear sclerosis, 2-3+ Cortical cataract, 2+ Posterior subcapsular cataract 2+ Nuclear sclerosis, 2+ Cortical cataract, 1-2+ Posterior subcapsular cataract   Vitreous Vitreous syneresis Vitreous syneresis         Fundus Exam       Right Left   Disc Pink and Sharp, +cupping Pink and Sharp, +cupping, mild PPP   C/D Ratio 0.7 0.8   Macula Flat, Good foveal reflex, RPE mottling and clumping, +small central cyst, No heme Flat, Good foveal reflex, RPE mottling and clumping, trace cystic changes centrally, No heme   Vessels mild attenuation, mild Copper wiring mild attenuation, mild  Copper wiring, mild tortuousity   Periphery Attached, No heme  Attached, no heme               IMAGING AND PROCEDURES  Imaging and Procedures for @TODAY @  OCT, Retina - OU - Both Eyes       Right Eye Quality was good. Central Foveal Thickness: 242. Progression has improved. Findings include abnormal foveal contour, no SRF, intraretinal fluid, vitreous traction (persistent VMT, Mild improvement in central cyst).   Left Eye Quality was good. Central Foveal Thickness: 236. Progression has been stable. Findings include abnormal foveal contour, no SRF, vitreous traction, intraretinal fluid (Persistent VMT with persistent, trace central cystic changes).   Notes *Images captured and stored on drive  Diagnosis / Impression:  OD: mild progression of VMT, interval development of small macular cyst OS: Persistent VMT with persistent, trace central cystic changes  Clinical management:  See below  Abbreviations: NFP - Normal foveal profile. CME - cystoid macular edema. PED - pigment epithelial detachment. IRF - intraretinal fluid. SRF - subretinal fluid. EZ - ellipsoid zone. ERM - epiretinal membrane. ORA - outer retinal atrophy. ORT - outer retinal tubulation. SRHM - subretinal hyper-reflective material            ASSESSMENT/PLAN:   ICD-10-CM   1. 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 Retinal edema  H35.81 OCT, Retina - OU - Both Eyes    3. Long-term use of Plaquenil  Z79.899     4. Essential hypertension  I10     5. Hypertensive retinopathy of both eyes  H35.033  6. Anatomical narrow angle glaucoma  H40.039     7. Combined forms of age-related cataract of both eyes  H25.813     1,2. VMT OU  - OCT shows persistent cystic changes from VMT (OD > OS)  - pt denies significant vision changes or metamorphopsia  - BCVA OD 20/30 (stable); OS improved to 20/25 from 20/30  - discussed findings, prognosis, including possible development of macular hole  - cont  amsler grid monitoring  - f/u 6 months, sooner prn -- DFE, OCT  3. 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 Hypertensive retinopathy OU  - discussed importance of tight BP control  - monitor  6. Narrow angle glaucoma OU  - IOP today: 18,16  - under the expert management of Dr. Kathlen Mody  7. Mixed cataracts OU  - The symptoms of cataract, surgical options, and treatments and risks were discussed with patient.   - under the expert management of Dr. Kathlen Mody.  Ophthalmic Meds Ordered this visit:  No orders of the defined types were placed in this encounter.    Return in about 6 months (around 01/13/2022) for f/u VMT OU, DFE, OCT.  There are no Patient Instructions on file for this visit.  This document serves as a record of services personally performed by Gardiner Sleeper, MD, PhD. It was created on their behalf by Orvan Falconer, an ophthalmic technician. The creation of this record is the provider's dictation and/or activities during the visit.    Electronically signed by: Orvan Falconer, OA, 07/15/21  1:03 PM   Gardiner Sleeper, M.D., Ph.D. Diseases & Surgery of the Retina and Vitreous [facility]  I have reviewed the above documentation for accuracy and completeness, and I agree with the above. Gardiner Sleeper, M.D., Ph.D. 07/15/21 1:03 PM   Abbreviations: M myopia (nearsighted); A astigmatism; H hyperopia (farsighted); P presbyopia; Mrx spectacle  prescription;  CTL contact lenses; OD right eye; OS left eye; OU both eyes  XT exotropia; ET esotropia; PEK punctate epithelial keratitis; PEE punctate epithelial erosions; DES dry eye syndrome; MGD meibomian gland dysfunction; ATs artificial tears; PFAT's preservative free artificial tears; [place] nuclear sclerotic cataract; PSC posterior subcapsular cataract; ERM epi-retinal membrane; PVD posterior vitreous detachment; RD retinal detachment; DM diabetes mellitus; DR diabetic retinopathy; NPDR non-proliferative diabetic retinopathy; PDR proliferative diabetic retinopathy; CSME clinically significant macular edema; DME diabetic macular edema; dbh dot blot hemorrhages; CWS cotton wool spot; POAG primary open angle glaucoma; C/D cup-to-disc ratio; HVF humphrey visual field; GVF goldmann visual field; OCT optical coherence tomography; IOP intraocular pressure; BRVO Branch retinal vein occlusion; CRVO central retinal vein occlusion; CRAO central retinal artery occlusion; BRAO branch retinal artery occlusion; RT retinal tear; SB scleral buckle; PPV pars plana vitrectomy; VH Vitreous hemorrhage; PRP panretinal laser photocoagulation; IVK intravitreal kenalog; VMT vitreomacular traction; MH Macular hole;  NVD neovascularization of the disc; NVE neovascularization elsewhere; AREDS age related eye disease study; ARMD age related macular  degeneration; POAG primary open angle glaucoma; EBMD epithelial/anterior basement membrane dystrophy; ACIOL anterior chamber intraocular lens; IOL intraocular lens; PCIOL posterior chamber intraocular lens; Phaco/IOL phacoemulsification with intraocular lens placement; [place] photorefractive keratectomy; LASIK laser assisted in situ keratomileusis; HTN hypertension; DM diabetes mellitus; COPD chronic obstructive pulmonary disease

## 2021-07-15 ENCOUNTER — Encounter (INDEPENDENT_AMBULATORY_CARE_PROVIDER_SITE_OTHER): Payer: Self-pay | Admitting: Ophthalmology

## 2021-07-15 ENCOUNTER — Ambulatory Visit (INDEPENDENT_AMBULATORY_CARE_PROVIDER_SITE_OTHER): Payer: BC Managed Care – PPO | Admitting: Ophthalmology

## 2021-07-15 ENCOUNTER — Other Ambulatory Visit (HOSPITAL_COMMUNITY): Payer: Self-pay | Admitting: Cardiology

## 2021-07-15 ENCOUNTER — Other Ambulatory Visit: Payer: Self-pay

## 2021-07-15 DIAGNOSIS — H35033 Hypertensive retinopathy, bilateral: Secondary | ICD-10-CM

## 2021-07-15 DIAGNOSIS — H40039 Anatomical narrow angle, unspecified eye: Secondary | ICD-10-CM

## 2021-07-15 DIAGNOSIS — H25813 Combined forms of age-related cataract, bilateral: Secondary | ICD-10-CM

## 2021-07-15 DIAGNOSIS — I10 Essential (primary) hypertension: Secondary | ICD-10-CM | POA: Diagnosis not present

## 2021-07-15 DIAGNOSIS — H43823 Vitreomacular adhesion, bilateral: Secondary | ICD-10-CM

## 2021-07-15 DIAGNOSIS — H3581 Retinal edema: Secondary | ICD-10-CM

## 2021-07-15 DIAGNOSIS — Z79899 Other long term (current) drug therapy: Secondary | ICD-10-CM | POA: Diagnosis not present

## 2021-07-17 ENCOUNTER — Telehealth (HOSPITAL_COMMUNITY): Payer: Self-pay

## 2021-07-17 ENCOUNTER — Encounter (HOSPITAL_COMMUNITY): Payer: Self-pay

## 2021-07-17 NOTE — Telephone Encounter (Signed)
Pt insurance is active and benefits verified through [place]. Co-pay $0.00, DED $1,250.00/$1,250.00 met, out of pocket $4,890.00/$2,765.80 met, co-insurance 20%. No pre-authorization required. Mary/BCBS, 07/16/21 @ 359PM, WEX#[id number]   Will contact patient to see if she is interested in the Pulmonary Rehab Program.

## 2021-07-17 NOTE — Telephone Encounter (Signed)
Attempted to contact pt in regards to PR.  Mailed letter 

## 2021-07-20 ENCOUNTER — Telehealth (HOSPITAL_COMMUNITY): Payer: Self-pay

## 2021-07-20 NOTE — Telephone Encounter (Signed)
Pt returned phone call and stated that the hours for pulmonary rehab will not work with her work schedule. I advised pt to call us back if anything changes. Closed referral.

## 2021-07-21 ENCOUNTER — Ambulatory Visit (HOSPITAL_COMMUNITY)
Admission: RE | Admit: 2021-07-21 | Discharge: 2021-07-21 | Disposition: A | Discharge status: Home / Self Care | Payer: BC Managed Care – PPO | Source: Ambulatory Visit | Attending: Cardiology | Admitting: Cardiology

## 2021-07-21 ENCOUNTER — Other Ambulatory Visit: Payer: Self-pay

## 2021-07-21 DIAGNOSIS — I5022 Chronic systolic (congestive) heart failure: Secondary | ICD-10-CM | POA: Diagnosis present

## 2021-07-21 LAB — CBC
HCT: 40.2 % (ref 36.0–46.0)
Hemoglobin: 13.3 g/dL (ref 12.0–15.0)
MCH: 33.1 pg (ref 26.0–34.0)
MCHC: 33.1 g/dL (ref 30.0–36.0)
MCV: 100 fL (ref 80.0–100.0)
Platelets: 210 10*3/uL (ref 150–400)
RBC: 4.02 MIL/uL (ref 3.87–5.11)
RDW: 14.4 % (ref 11.5–15.5)
WBC: 3.5 10*3/uL — ABNORMAL LOW (ref 4.0–10.5)
nRBC: 0 % (ref 0.0–0.2)

## 2021-07-21 LAB — BASIC METABOLIC PANEL
Anion gap: 6 (ref 5–15)
BUN: 8 mg/dL (ref 6–20)
CO2: 28 mmol/L (ref 22–32)
Calcium: 8.8 mg/dL — ABNORMAL LOW (ref 8.9–10.3)
Chloride: 106 mmol/L (ref 98–111)
Creatinine, Ser: 0.75 mg/dL (ref 0.44–1.00)
GFR, Estimated: >60 mL/min (ref >60)
Glucose, Bld: 84 mg/dL (ref 70–99)
Potassium: 4.4 mmol/L (ref 3.5–5.1)
Sodium: 140 mmol/L (ref 135–145)

## 2021-07-23 ENCOUNTER — Other Ambulatory Visit (HOSPITAL_COMMUNITY): Payer: Self-pay | Admitting: *Deleted

## 2021-07-23 DIAGNOSIS — I5022 Chronic systolic (congestive) heart failure: Secondary | ICD-10-CM

## 2021-08-10 ENCOUNTER — Encounter (HOSPITAL_COMMUNITY): Payer: Self-pay | Admitting: Cardiology

## 2021-08-10 ENCOUNTER — Encounter (HOSPITAL_COMMUNITY)
Admission: RE | Disposition: A | Discharge status: Home / Self Care | Payer: Self-pay | Source: Home / Self Care | Attending: Cardiology

## 2021-08-10 ENCOUNTER — Other Ambulatory Visit: Payer: Self-pay

## 2021-08-10 ENCOUNTER — Ambulatory Visit (HOSPITAL_COMMUNITY)
Admission: RE | Admit: 2021-08-10 | Discharge: 2021-08-10 | Disposition: A | Discharge status: Home / Self Care | Payer: BC Managed Care – PPO | Attending: Cardiology | Admitting: Cardiology

## 2021-08-10 DIAGNOSIS — I5022 Chronic systolic (congestive) heart failure: Secondary | ICD-10-CM

## 2021-08-10 DIAGNOSIS — Z7982 Long term (current) use of aspirin: Secondary | ICD-10-CM | POA: Insufficient documentation

## 2021-08-10 DIAGNOSIS — Z7952 Long term (current) use of systemic steroids: Secondary | ICD-10-CM | POA: Diagnosis not present

## 2021-08-10 DIAGNOSIS — I272 Pulmonary hypertension, unspecified: Secondary | ICD-10-CM | POA: Insufficient documentation

## 2021-08-10 DIAGNOSIS — Z881 Allergy status to other antibiotic agents status: Secondary | ICD-10-CM | POA: Insufficient documentation

## 2021-08-10 DIAGNOSIS — Z7901 Long term (current) use of anticoagulants: Secondary | ICD-10-CM | POA: Diagnosis not present

## 2021-08-10 DIAGNOSIS — Z79899 Other long term (current) drug therapy: Secondary | ICD-10-CM | POA: Diagnosis not present

## 2021-08-10 HISTORY — PX: RIGHT HEART CATH: CATH118263

## 2021-08-10 LAB — POCT I-STAT EG7
Acid-Base Excess: 3 mmol/L — ABNORMAL HIGH (ref 0.0–2.0)
Acid-Base Excess: 2 mmol/L (ref 0.0–2.0)
Bicarbonate: 29.3 mmol/L — ABNORMAL HIGH (ref 20.0–28.0)
Bicarbonate: 28.2 mmol/L — ABNORMAL HIGH (ref 20.0–28.0)
Calcium, Ion: 1.21 mmol/L (ref 1.15–1.40)
Calcium, Ion: 1.09 mmol/L — ABNORMAL LOW (ref 1.15–1.40)
HCT: 39 % (ref 36.0–46.0)
HCT: 37 % (ref 36.0–46.0)
Hemoglobin: 13.3 g/dL (ref 12.0–15.0)
Hemoglobin: 12.6 g/dL (ref 12.0–15.0)
O2 Saturation: 73 %
O2 Saturation: 74 %
Potassium: 3.8 mmol/L (ref 3.5–5.1)
Potassium: 3.6 mmol/L (ref 3.5–5.1)
Sodium: 143 mmol/L (ref 135–145)
Sodium: 144 mmol/L (ref 135–145)
TCO2: 31 mmol/L (ref 22–32)
TCO2: 30 mmol/L (ref 22–32)
pCO2, Ven: 52.9 mmHg (ref 44.0–60.0)
pCO2, Ven: 51.4 mmHg (ref 44.0–60.0)
pH, Ven: 7.352 (ref 7.250–7.430)
pH, Ven: 7.347 (ref 7.250–7.430)
pO2, Ven: 41 mmHg (ref 32.0–45.0)
pO2, Ven: 42 mmHg (ref 32.0–45.0)

## 2021-08-10 LAB — PROTIME-INR
INR: 1.9 — ABNORMAL HIGH (ref 0.8–1.2)
Prothrombin Time: 21.9 seconds — ABNORMAL HIGH (ref 11.4–15.2)

## 2021-08-10 SURGERY — RIGHT HEART CATH
Anesthesia: LOCAL

## 2021-08-10 MED ORDER — SODIUM CHLORIDE 0.9 % IV SOLN: 250.0000 mL | INTRAVENOUS | Status: DC | PRN

## 2021-08-10 MED ORDER — ONDANSETRON HCL 4 MG/2ML IJ SOLN: 4.0000 mg | Freq: Four times a day (QID) | INTRAMUSCULAR | Status: DC | PRN

## 2021-08-10 MED ORDER — SODIUM CHLORIDE 0.9 % IV SOLN: INTRAVENOUS | Status: DC

## 2021-08-10 MED ORDER — MIDAZOLAM HCL 2 MG/2ML IJ SOLN
INTRAMUSCULAR | Status: AC
  Filled 2021-08-10: qty 2

## 2021-08-10 MED ORDER — SODIUM CHLORIDE 0.9% FLUSH: 3.0000 mL | INTRAVENOUS | Status: DC | PRN

## 2021-08-10 MED ORDER — LIDOCAINE HCL (PF) 1 % IJ SOLN
INTRAMUSCULAR | Status: AC
  Filled 2021-08-10: qty 30

## 2021-08-10 MED ORDER — LABETALOL HCL 5 MG/ML IV SOLN: 10.0000 mg | INTRAVENOUS | Status: DC | PRN

## 2021-08-10 MED ORDER — LIDOCAINE HCL (PF) 1 % IJ SOLN
INTRAMUSCULAR | Status: DC | PRN
  Administered 2021-08-10: 2 mL

## 2021-08-10 MED ORDER — ACETAMINOPHEN 325 MG PO TABS: 650.0000 mg | ORAL_TABLET | ORAL | Status: DC | PRN

## 2021-08-10 MED ORDER — FUROSEMIDE 20 MG PO TABS: 20.0000 mg | ORAL_TABLET | Freq: Every day | ORAL | 5 refills | Status: DC

## 2021-08-10 MED ORDER — HEPARIN (PORCINE) IN NACL 1000-0.9 UT/500ML-% IV SOLN
INTRAVENOUS | Status: DC | PRN
  Administered 2021-08-10: 500 mL

## 2021-08-10 MED ORDER — HYDRALAZINE HCL 20 MG/ML IJ SOLN: 10.0000 mg | INTRAMUSCULAR | Status: DC | PRN

## 2021-08-10 MED ORDER — FENTANYL CITRATE (PF) 100 MCG/2ML IJ SOLN
INTRAMUSCULAR | Status: AC
  Filled 2021-08-10: qty 2

## 2021-08-10 MED ORDER — SODIUM CHLORIDE 0.9% FLUSH: 3.0000 mL | Freq: Two times a day (BID) | INTRAVENOUS | Status: DC

## 2021-08-10 MED ORDER — ASPIRIN 81 MG PO CHEW: 81.0000 mg | CHEWABLE_TABLET | ORAL | Status: DC

## 2021-08-10 SURGICAL SUPPLY — 6 items
CATH BALLN WEDGE 5F 110CM (CATHETERS) ×1 IMPLANT
GUIDEWIRE .025 260CM (WIRE) ×1 IMPLANT
KIT HEART LEFT (KITS) ×2 IMPLANT
PACK CARDIAC CATHETERIZATION (CUSTOM PROCEDURE TRAY) ×2 IMPLANT
SHEATH GLIDE SLENDER 4/5FR (SHEATH) ×1 IMPLANT
TRANSDUCER W/STOPCOCK (MISCELLANEOUS) ×2 IMPLANT

## 2021-08-10 NOTE — H&P (Signed)
Advanced Heart Failure Team History and Physical Note   PCP:  Deland Pretty, MD  PCP-Cardiology: None      HPI:    H/o pulmonary hypertension, recheck PA pressure off sildenafil.    Review of Systems: All systems reviewed and negative except as per HPI.   Home Medications Prior to Admission medications   Medication Sig Start Date End Date Taking? Authorizing Provider  acetaminophen (TYLENOL) 325 MG tablet Take 650 mg by mouth every 6 (six) hours as needed for moderate pain.   Yes [provider]  aspirin 81 MG EC tablet Take 81 mg by mouth daily.   Yes [provider]  azaTHIOprine (IMURAN) 50 MG tablet Take 50 mg by mouth daily.    Yes [provider]  Belimumab (BENLYSTA) 200 MG/ML SOSY Inject 1 Dose into the skin every Monday.   Yes [provider]  calcium carbonate (OS-CAL - DOSED IN MG OF ELEMENTAL CALCIUM) 1250 (500 Ca) MG tablet Take 2 tablets by mouth daily.   Yes [provider]  cholecalciferol (VITAMIN D3) 25 MCG (1000 UNIT) tablet Take 1,000 Units by mouth daily.   Yes [provider]  Cyanocobalamin (B-12) 1000 MCG TABS Take 1,000 mcg by mouth daily.   Yes [provider]  Folic Acid (FOLATE PO) Take 666 mcg by mouth daily.   Yes [provider]  furosemide (LASIX) 20 MG tablet Take 40mg  (2 tablets) by mouth alternating with 20mg  (1 tablet) by mouth daily. 06/30/21  Yes Larey Dresser, MD  KLOR-CON M20 20 MEQ tablet TAKE 1 TABLET TWICE A DAY 07/15/21  Yes Larey Dresser, MD  Magnesium 250 MG TABS Take 250 mg by mouth daily.   Yes [provider]  metoprolol succinate (TOPROL-XL) 25 MG 24 hr tablet TAKE 1/2 TABLET DAILY 09/30/20  Yes Larey Dresser, MD  Multiple Vitamin (MULTIVITAMIN) tablet Take 2 tablets by mouth daily.   Yes [provider]  predniSONE (DELTASONE) 5 MG tablet Take 5 mg by mouth daily.   Yes [provider]  sildenafil (REVATIO) 20 MG tablet Take 20  mg by mouth daily.   Yes [provider]  Tiotropium Bromide Monohydrate (SPIRIVA RESPIMAT) 2.5 MCG/ACT AERS Inhale 2 puffs into the lungs daily. 06/11/21  Yes Brand Males, MD  valACYclovir (VALTREX) 1000 MG tablet Take 1,000 mg by mouth daily as needed (fever blisters). 08/14/19  Yes [provider]  warfarin (COUMADIN) 5 MG tablet Take as directed by the anticoagulation clinic. Patient taking differently: Take 5-7.5 mg by mouth See admin instructions. 7.5mg  on Sun,Mon,Tues, Thurs, and Fri 5 mg on Wed and Sat 03/23/11  Yes Larey Dresser, MD    Past Medical History: Past Medical History:  Diagnosis Date   Abscess of right breast    sternal osteomyeliitis, pseudomonas   C. 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    Cataract    Mixed OU   CHF (congestive heart failure) (HCC)    secondary to mmoderate mitral stenosis and moderate mitral regurgitation in the setting of rheumatic vlave disease as well as severe pulmonary HTN likely due to a combination of MV disease and pulmoanary arterial HTN in the setting of collagen vascular disease. Not valvuloplasty candidate due to MR. patient had MV replacement with mechanical MV in 8/11.   CHF (congestive heart  failure) ([place])    Echo post-op showed normally functioning mechanical MV, EF 60-65% with septal bounce, small mobile structure in the mid ventricle (? loose papillary muscle), RV-RA gradient 22 mmHg. Echo (10/11): EF 60-65%, normally functioning mechanical mitral vlavue.    Hypertensive retinopathy    OU   Mixed connective tissue disease ([place])    diagnosis 1990. 8/10 ANA positive, anti-dDNA positive, anti-SCL70 negative   Nephrotic syndrome    secondary to SLE   Pulmonary HTN ([place])    porbably secondary to combination of rheumatic MV disease and pulmonary arterial HYN form  vollagen vascalr disease. CTA chest (7/10) with no evidence for pulmonary emboli or chronic pulmonary emboli. PA pressure 72/28 mean 50 with PVR 5.7 WU on initial RHC, PA pressure 45/21 mean 30 PVR 2.3 WU on f/u RHC on Revatio 80 mg three times a day (1/11).  After MV surgery, Revatio stopped.   Pulmonary HTN (HCC)    increased  dyspnea. Repeat RHC with mean RA 6, PA 42/20 mean 29, PVR 4.8 WU,  2.3. Revatio 20 mg  3x a day restarted. 3/11 6 min walk 498m. 2/12 6 min walk 427 m.    Raynaud's syndrome    Restrictive lung disease    secondary to MCTD. PFTs 03/06/99 VC 47% DLCO 26%. PFTs 09/12/08 VC 43% DLCO 45%. PFTs 6/10 FVC 37%, FEV1 34%, ratio 69%, TLC 50%. using home O2 periodically   Rheumatic fever    Warfarin anticoagulation    for mechanical MV, goal INR 2.5-3.5    Past Surgical History: Past Surgical History:  Procedure Laterality Date   BREAST CYST ASPIRATION  03/09/2012   CESAREAN SECTION     COLONOSCOPY     s/p mitral valvue replacement  05/13/2010   by Dr. Roxy Manns by minimally invasive window.    TUBAL LIGATION      Family History:  Family History  Problem Relation Age of Onset   Emphysema Mother        smoker   COPD Mother    Heart disease Maternal Grandmother        side of family not given   Diabetes Maternal Grandmother    Hypertension Father    Osteoarthritis Father    Sleep apnea Father    Breast cancer Maternal Aunt     Social History: Social History   Socioeconomic History   Marital status: Single    Spouse name: Not on file   Number of children: Not on file   Years of education: Not on file   Highest education level: Not on file  Occupational History   Not on file  Tobacco Use   Smoking status: Never   Smokeless tobacco: Never  Substance and Sexual Activity   Alcohol use: No   Drug use: No   Sexual activity: Not Currently    Birth control/protection: Other-see comments    Comment: BTL  Other Topics Concern   Not on file  Social History Narrative    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    Social Determinants of Health   Financial Resource Strain: Not on file  Food Insecurity: Not on file  Transportation Needs: Not on file  Physical Activity: Not on file  Stress: Not on file  Social Connections: Not on file    Allergies:  Allergies  Allergen Reactions   Cephalexin Hives   Methotrexate Nausea And Vomiting    Objective:    Vital Signs:   Temp:  [97.8  F (36.6 C)] 97.8 F (36.6 C) (10/31 0704) Pulse Rate:  [92] 92 (10/31 0704) Resp:  [18] 18 (10/31 0704) BP: (132)/(85) 132/85 (10/31 0704) SpO2:  [100 %] 100 % (10/31 0943) Weight:  [83.9 kg] 83.9 kg (10/31 0704)   Filed Weights   08/10/21 0704  Weight: 83.9 kg     Physical Exam     General:  Well appearing. No respiratory difficulty HEENT: Normal Neck: Supple. no JVD. Carotids 2+ bilat; no bruits. No lymphadenopathy or thyromegaly appreciated. Cor: PMI nondisplaced. Regular rate & rhythm. Mechanical S1. No rubs, gallops or murmurs. Lungs: Clear Abdomen: Soft, nontender, nondistended. No hepatosplenomegaly. No bruits or masses. Good bowel sounds. Extremities: No cyanosis, clubbing, rash, edema Neuro: Alert & oriented x 3, cranial nerves grossly intact. moves all 4 extremities w/o difficulty. Affect pleasant.   Labs     Basic Metabolic Panel: No results for input(s): NA, K, CL, CO2, GLUCOSE, BUN, CREATININE, CALCIUM, MG, PHOS in the last 168 hours.  Liver Function Tests: No results for input(s): AST, ALT, ALKPHOS, BILITOT, PROT, ALBUMIN in the last 168 hours. No results for input(s): LIPASE, AMYLASE in the last 168 hours. No results for input(s): AMMONIA in the last 168 hours.  CBC: No results for input(s): WBC, NEUTROABS, HGB, HCT, MCV, PLT in the last 168 hours.  Cardiac Enzymes: No results for input(s): CKTOTAL, CKMB, CKMBINDEX, TROPONINI in the last 168 hours.  BNP: BNP (last 3 results) Recent Labs    08/19/20 1033  05/05/21 1217 06/30/21 1133  BNP 76.2 74.3 102.8*    ProBNP (last 3 results) No results for input(s): PROBNP in the last 8760 hours.   CBG: No results for input(s): GLUCAP in the last 168 hours.  Coagulation Studies: Recent Labs    08/10/21 0748  LABPROT 21.9*  INR 1.9*    Imaging: No results found.   Assessment/Plan   RHC today to assess PA pressure off sildenafil.    Loralie Champagne, MD 08/10/2021, 9:56 AM  Advanced Heart Failure Team Pager [phone] (M-F; 7a - 5p)  Please contact [facility] for night-coverage after hours (4p -7a ) and weekends on [email or web address]

## 2021-08-10 NOTE — Discharge Instructions (Signed)
Call Dr Claris Gladden office if any problems, questions, or concerns; call if any redness, drainage, fever, pain, swelling, or bleeding right arm site

## 2021-09-07 ENCOUNTER — Telehealth: Payer: Self-pay | Admitting: Neurology

## 2021-09-07 ENCOUNTER — Encounter: Payer: Self-pay | Admitting: Neurology

## 2021-09-07 NOTE — Telephone Encounter (Signed)
Pt called states she has been using her CPAP machine but she says it is not helping her. Pt requesting a call back.

## 2021-09-07 NOTE — Telephone Encounter (Signed)
Called the pt back. There was no answer. LVM asking for the pt to either call back or she can send a mychart message with her concern and we will be able to discuss further.

## 2021-09-08 ENCOUNTER — Encounter (HOSPITAL_COMMUNITY): Payer: BC Managed Care – PPO | Admitting: Cardiology

## 2021-09-11 ENCOUNTER — Ambulatory Visit (HOSPITAL_COMMUNITY)
Admission: RE | Admit: 2021-09-11 | Discharge: 2021-09-11 | Disposition: A | Discharge status: Home / Self Care | Payer: BC Managed Care – PPO | Source: Ambulatory Visit | Attending: Cardiology | Admitting: Cardiology

## 2021-09-11 ENCOUNTER — Other Ambulatory Visit: Payer: Self-pay

## 2021-09-11 VITALS — BP 102/65 | HR 80 | Wt 190.4 lb

## 2021-09-11 DIAGNOSIS — G4733 Obstructive sleep apnea (adult) (pediatric): Secondary | ICD-10-CM | POA: Diagnosis not present

## 2021-09-11 DIAGNOSIS — Z952 Presence of prosthetic heart valve: Secondary | ICD-10-CM | POA: Diagnosis not present

## 2021-09-11 DIAGNOSIS — J8410 Pulmonary fibrosis, unspecified: Secondary | ICD-10-CM | POA: Diagnosis not present

## 2021-09-11 DIAGNOSIS — Z7982 Long term (current) use of aspirin: Secondary | ICD-10-CM | POA: Diagnosis not present

## 2021-09-11 DIAGNOSIS — Z79899 Other long term (current) drug therapy: Secondary | ICD-10-CM | POA: Insufficient documentation

## 2021-09-11 DIAGNOSIS — I272 Pulmonary hypertension, unspecified: Secondary | ICD-10-CM

## 2021-09-11 DIAGNOSIS — J439 Emphysema, unspecified: Secondary | ICD-10-CM | POA: Diagnosis not present

## 2021-09-11 DIAGNOSIS — Z7901 Long term (current) use of anticoagulants: Secondary | ICD-10-CM | POA: Diagnosis not present

## 2021-09-11 LAB — BASIC METABOLIC PANEL
Anion gap: 3 — ABNORMAL LOW (ref 5–15)
BUN: 9 mg/dL (ref 6–20)
CO2: 29 mmol/L (ref 22–32)
Calcium: 8.8 mg/dL — ABNORMAL LOW (ref 8.9–10.3)
Chloride: 107 mmol/L (ref 98–111)
Creatinine, Ser: 0.66 mg/dL (ref 0.44–1.00)
GFR, Estimated: >60 mL/min (ref >60)
Glucose, Bld: 91 mg/dL (ref 70–99)
Potassium: 4.8 mmol/L (ref 3.5–5.1)
Sodium: 139 mmol/L (ref 135–145)

## 2021-09-11 MED ORDER — POTASSIUM CHLORIDE CRYS ER 20 MEQ PO TBCR: 20.0000 meq | EXTENDED_RELEASE_TABLET | Freq: Every day | ORAL | 3 refills | Status: DC

## 2021-09-11 NOTE — Patient Instructions (Signed)
Decrease Potassium (Klor-con) to 20 meq Daily  Labs done today, your results will be available in MyChart, we will contact you for abnormal readings.  Your physician recommends that you schedule a follow-up appointment in: 6 months (June 2023), **PLEASE CALL OUR OFFICE IN April TO SCHEDULE THIS APPOINTMENT  If you have any questions or concerns before your next appointment please send [place] a message through [place] or call our office at [phone].    TO LEAVE A MESSAGE FOR THE NURSE SELECT OPTION 2, PLEASE LEAVE A MESSAGE INCLUDING: YOUR NAME DATE OF BIRTH CALL BACK NUMBER REASON FOR CALL**this is important as we prioritize the call backs  YOU WILL RECEIVE A CALL BACK THE SAME DAY AS LONG AS YOU CALL BEFORE 4:00 PM  At the [facility], you and your health needs are our priority. As part of our continuing mission to provide you with exceptional heart care, we have created designated Provider Care Teams. These Care Teams include your primary Cardiologist (physician) and Advanced Practice Providers (APPs- Physician Assistants and Nurse Practitioners) who all work together to provide you with the care you need, when you need it.   You may see any of the following providers on your designated Care Team at your next follow up: Dr Glori Bickers Dr Haynes Kerns, NP Lyda Jester, [place] [facility] [place], [place] Audry Riles, PharmD   Please be sure to bring in all your medications bottles to every appointment.

## 2021-09-13 NOTE — Progress Notes (Signed)
Patient ID: Katie [name], female   DOB: 1966-01-13, 55 y.o.   MRN: [id number] PCP: Dr. Shelia Media Cardiology: Dr . Aundra Dubin  55 y.o. 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    Echo in 1/19 showed EF 60-65%, mechanical mitral valve with mean gradient 5 mmHg, and PA systolic pressure 33 mmHg with normal RV.  Echo in 8/20 showed EF 50-55%, normal RV, mechanical MV with mean gradient 6 mmHg, unable to estimate PA systolic pressure, normal RV.  Echo in 11/21 showed EF 60-65%, RV probably normal, PASP 25 mmHg, mechanical MV with mean gradient 5 mmHg.   RHC was done in 10/22 after stopping sildenafil. This showed normal filling pressures and normal PA pressure.   Patient is doing well.  She has a chronic dry cough that she has had for months.  No significant exertional dyspnea.  No chest pain.  No orthopnea/PND.  No lightheadedness.  No BRBPR/melena.  She will get occasional vertigo-type symptoms (spinning sensation).  She is on chronic prednisone for MCTD and feels like it interrupts her sleep.   Labs (8/11): HCT 27 => 30, creatinine 0.98, INR 4 Labs (9/11): K 4.6, creatinine 0.9, HCT 32.7, BNP 214=>146, LFTs normal, TSH normal Labs (1/12): HCT 37.9, K 3.5, creatinine 0.9 Labs (4/12): LDL 108, HDL 43, TSH normal, K 4.1, creatinine 0.8, HCT 37.9 Labs (1/13): K 3.8, creatinine 0.8 Labs (11/14): K 3.6, creatinine 0.71, LDL 94, HCT 38 Labs (12/15): Hgb 12.5 Labs (2/16): K 3.9, creatinine 0.84 Labs (8/16): K 3.9, creatinine 0.87, HCT 38.6 Labs (4/17): K 3.6, creatinine 0.88, LDL 92, HDL 38, hgb 11.7 Labs (10/17): K 3.6, creatinine 0.81, hgb 12.1 Labs (1/20): K 4.3, creatinine 0.79, LDL 107, hgb 12 Labs (6/21): creatinine 0.7 Labs (7/22):  BNP 24, K 4.3, creatinine 0.78 Labs (9/22): BNP 103 Labs (10/22): K 4.4, creatinine 0.75, hgb 13.3  Allergies (verified):  1)  ! Methotrexate  Past Medical History: 1. Mixed connective tissue disease: diagnosis 1990. 8/10 ANA positive, anti-dsDNA positive, anti-SCL70 negative 2. 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     - PFT's 03/06/99  VC 47%  DLC0 26%    - PFT's 09/12/08  VC 43%  DLC0 45%    - PFT's 6/10 FVC 37%, FEV1 34%, ratio 69%, TLC 50%    - PFTs 11/15 FVC 39%, FEV1 35%, ratio 89%, TLC 63%, DLCO 48%    - CT chest in 2/22 showed emphysema but no ILD.  4. Nephrotic syndrome: secondary to MCTD 5. CHF: Secondary to moderate mitral stenosis and moderate mitral regurgitation in the setting of rheumatic mitral valve disease as well as severe pulmonary HTN likely due to a combination of MV disease and pulmonary arterial HTN in the setting of collagen vascular disease.   Not valvuloplasty candidate due to MR.  Patient had MV replacement with mechanical MV in 8/11.  Echo (8/11) post-op showed normally functioning mechanical MV, EF 60-65% with septal bounce, small mobile structure in the mid ventricle (? loose papillary muscle), RV-RA gradient 22 mmHg.  Echo (10/11): EF 60-65%, normally functioning mechanical mitral valve.  Echo (7/13): EF 55-60%, mechanical mitral valve with mean gradient 4 mmHg, PA systolic pressure 29 mmHg. Echo (5/15) with EF 55-60%, mechanical mitral valve functioning normally, normal RV size  and systolic function, PA systolic pressure 32 mmHg.  - Echo in 5/17 showed EF 60-65%, normal mechanical mitral valve, and PA systolic pressure 33 mmHg with normal RV.  - Echo (1/19): EF 60-65%, mild LVH, normal RV size and systolic function, mechanical mitral valve with mean gradient 6 mmHg, PASP 33 mmHg.  - Echo (8/20): EF 50-55%, normal RV, mechanical MV with mean gradient 6 mmHg, unable to estimate PA systolic pressure, normal RV.  No  change from prior.  - Echo (11/21): EF 60-65%, RV probably normal, PASP 25 mmHg, mechanical MV with mean gradient 5 mmHg. 6.  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  CTA chest (7/10) with no evidence for pulmonary emboli or chronic pulmonary emboli.  PA pressure 72/28 mean 50 with PVR 5.7 WU on initial RHC, PA pressure 45/21 mean 30 PVR 2.3 WU on f/u RHC on Revatio 80 mg three times a day (1/11).  After MV surgery, Revatio stopped.  Increased dyspnea.  Repeat RHC with mean RA 6, PA 42/20 (mean 29), PVR 4.8 WU, CI 2.3.  Revatio 20 mg three times a day restarted.  Echo (2/44) with PA systolic pressure 29 mmHg. Echo (0/10) with PA systolic pressure 32 mmHg.  - 3/11 6 min walk 427 m - 2/12 6 min walk 427 m - 1/13 6 min walk 411 m - 4/15 6 min walk 448 m - 2/16 6 min walk 421 m - 8/16 6 min walk 354 m - 9/17 6 min walk 436 m - 1/19 6 min walk 366 m - 10/19 6 min walk 402 m - 8/20 6 min walk 137 m (but walked slowly and stopped early due to knee pain).  - RHC (10/22): mean RA 1, PA 28/8 mean 18, mean PCWP 7, CI 2.83, PVR 2 WU.  7.  Rheumatic fever 8.  LHC (9/10): no angiographic CAD.  9.  Warfarin anticoagulation for mechanical MV, goal INR 2.5-3.5 10.  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  11. 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 C difficile diarrhea 1/12 13. GERD 14. Holter (1/19): No significant arrhythmias.  15. COPD: CT chest in 2/22 showed emphysema but no ILD.  Suspect due to passive smoking.   Family History: Emphysema mother, smoker.  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   ROS: All systems reviewed and negative except as noted in HPI.   Current Outpatient Medications  Medication Sig Dispense Refill   acetaminophen (TYLENOL) 325 MG tablet  Take 650 mg by mouth every 6 (six) hours as needed for moderate pain.     aspirin 81 MG EC tablet Take 81 mg by mouth daily.     azaTHIOprine (IMURAN) 50 MG tablet Take 50 mg by mouth daily.      Belimumab (BENLYSTA) 200 MG/ML SOSY Inject 1 Dose into the skin every Monday.     calcium carbonate (OS-CAL - DOSED IN MG OF ELEMENTAL CALCIUM) 1250 (500 Ca) MG tablet Take 2 tablets by mouth daily.     cholecalciferol (VITAMIN D3) 25 MCG (1000 UNIT) tablet Take 1,000 Units by mouth daily.     Cyanocobalamin (B-12) 1000 MCG TABS Take 1,000 mcg by mouth daily.     Folic Acid (FOLATE PO) Take 666 mcg by mouth daily.     furosemide (LASIX) 20 MG tablet Take 1 tablet (20 mg total) by  mouth daily. Take 40mg  (2 tablets) by mouth alternating with 20mg  (1 tablet) by mouth daily. 135 tablet 5   Magnesium 250 MG TABS Take 250 mg by mouth daily.     metoprolol succinate (TOPROL-XL) 25 MG 24 hr tablet TAKE 1/2 TABLET DAILY 45 tablet 3   Multiple Vitamin (MULTIVITAMIN) tablet Take 2 tablets by mouth daily.     predniSONE (DELTASONE) 5 MG tablet Take 5 mg by mouth daily.     Tiotropium Bromide Monohydrate (SPIRIVA RESPIMAT) 2.5 MCG/ACT AERS Inhale 2 puffs into the lungs daily. 4 g 5   valACYclovir (VALTREX) 1000 MG tablet Take 1,000 mg by mouth daily as needed (fever blisters).     warfarin (COUMADIN) 5 MG tablet Take as directed by the anticoagulation clinic. 135 tablet 3   potassium chloride SA (KLOR-CON M20) 20 MEQ tablet Take 1 tablet (20 mEq total) by mouth daily. 180 tablet 3   No current facility-administered medications for this encounter.    BP 102/65   Pulse 80   Wt 86.4 kg (190 lb 6.4 oz)   BMI 31.68 kg/m  General: NAD Neck: No JVD, no thyromegaly or thyroid nodule.  Lungs: Clear to auscultation bilaterally with normal respiratory effort. CV: Nondisplaced PMI.  Heart regular S1/S2 with mechanical S1, no S3/S4, no murmur.  No peripheral edema.  No carotid bruit.  Normal pedal pulses.  Abdomen:  Soft, nontender, no hepatosplenomegaly, no distention.  Skin: Intact without lesions or rashes.  Neurologic: Alert and oriented x 3.  Psych: Normal affect. Extremities: No clubbing or cyanosis.  HEENT: Normal.   Assessment/Plan:  1. Status post mechanical MVR:  Mitral valve replacement, stable from valve standpoint. Echo in 11/21 showed mean gradient 5 mmHg without significant MR.  She is on coumadin goal INR 2.5 - 3.5 and ASA 81 mg daily.   2.  Pulmonary HTN:  Pulmonary HTN is likely a mixed picture, from mitral valve disease as well as mixed connective tissue disease.  She had mild residual pulmonary HTN with moderately increased PVR even after mitral valve surgery on last RHC though this was improved compared to initial RHC.  The residual pulmonary HTN may be due to pulmonary vascular remodeling with chronic left atrial pressure elevation in the setting of mitral valve disease. Revatio was restarted at 20 mg tid.  Echo in 8/20 showed RV normal but unable to estimate PA systolic pressure.  Echo in 11/21 showed normal RV with PASP 25 mm Hg.  Symptomatically NYHA class I.  She is now off sildenafil.  RHC in 10/22 showed normal filling pressures and normal PA pressure (off sildenafil).  - She can stay off sildenafil.  - Continue lasix 20 mg daily, decrease KCl to 20 daily.  BMET today.  - Continue CPAP for OSA.  3. Mixed connective tissue disorder: Patient has history of pulmonary fibrosis, but most recent CT in 2/22 showed emphysema and no significant fibrosis. Followed [facility]ology at [facility].  - On prednisone and azathioprine.  4. COPD: Emphysema on 2/22 CT chest.  Thought to be due to passive smoking.  - She will continue Spiriva, has had symptomatic improvement.  5. OSA: Continue CPAP.   Followup in 6 months.   Loralie Champagne 09/13/2021

## 2021-10-07 ENCOUNTER — Encounter: Payer: Self-pay | Admitting: Neurology

## 2021-10-13 ENCOUNTER — Ambulatory Visit: Payer: BC Managed Care – PPO | Admitting: Family Medicine

## 2021-10-13 ENCOUNTER — Ambulatory Visit: Payer: BC Managed Care – PPO | Admitting: Neurology

## 2021-10-13 ENCOUNTER — Other Ambulatory Visit: Payer: Self-pay

## 2021-10-13 ENCOUNTER — Encounter: Payer: Self-pay | Admitting: Family Medicine

## 2021-10-13 VITALS — BP 125/82 | HR 96 | Ht 65.0 in | Wt 185.5 lb

## 2021-10-13 DIAGNOSIS — G4733 Obstructive sleep apnea (adult) (pediatric): Secondary | ICD-10-CM

## 2021-10-13 NOTE — Patient Instructions (Addendum)
Please continue using your CPAP regularly. While your insurance requires that you use CPAP at least 4 hours each night on 70% of the nights, I recommend, that you not skip any nights and use it throughout the night if you can. Getting used to CPAP and staying with the treatment long term does take time and patience and discipline. Untreated obstructive sleep apnea when it is moderate to severe can have an adverse impact on cardiovascular health and raise her risk for heart disease, arrhythmias, hypertension, congestive heart failure, stroke and diabetes. Untreated obstructive sleep apnea causes sleep disruption, nonrestorative sleep, and sleep deprivation. This can have an impact on your day to day functioning and cause daytime sleepiness and impairment of cognitive function, memory loss, mood disturbance, and problems focussing. Using CPAP regularly can improve these symptoms.   Consider trying extended release melatonin to see if this helps with insomnia. Continue close follow up with your care team. Continue CPAP nightly for at least 4 hours. Follow up in 4-6 months, sooner if needed.

## 2021-10-13 NOTE — Progress Notes (Signed)
PATIENT: Katie [name] DOB: 1966-07-03  REASON FOR VISIT: follow up HISTORY FROM: patient  Chief Complaint  Patient presents with   Obstructive Sleep Apnea    Rm 2, alone. Here for CPAP management. Pt reports having difficulty adjusting.       HISTORY OF PRESENT ILLNESS:  10/13/21 ALL:  Katie [name] is a 56 y.o. female here today for follow up for OSA on CPAP. HST 02/2021 showed severe OSa with total AHI of 86.2/hr.  She was last seen by Dr Brett Fairy 06/02/2021. She continued to report concerns of insomnia and fatigue. Dr Dohmeier felt that fatigue was most likely related to autoimmune disease as compliance data showed excellent management of OSA/CSA with residual AHI of 0. Headaches and sharp pains had significantly improved on CPAP. Dizziness continued with fatigue. She called 08/2021 reporting continued symptoms of dizziness, daytime sleepiness and fatigue. She reports these symptoms occur intermittently. She reports pain that prevents her from using CPAP. Tylenol usually helps pain. She also has insomnia and wakes multiple times at night to urinate. She is on daily prednisone. She is taking melatonin that does help her get to sleep. She does endorse more restful sleep and feels less fatigued when using CPAP. Most recently, she has not used CPAP consistently. Daily compliance at 53% and 4 hour at 30% over last 30 days. AHI was 0. She denies difficulty staying awake while driving. She drives a bus employment. She has a significant cardiac history of CHF, CAD and cardiac tamponade post MVR in 05/2010. She is followed for pulmonary HTN. No longer on O2.    HISTORY: (copied from Dr Dohmeier's previous note)  Interval History:  Katie [name] is a 56 y.o. female patient who had been seen at [facility] and is,now re- referred by Dr Shelia Media. Dizziness and sharp headaches. RV 06-02-2021: Katie [name] was last seen on 4 19-22 and had a procedure with a diagnosis of primary pulmonary hypertension on 02-1621.   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  She had a pericardial effusion and has a history of coronary artery disease pulmonary hypertension.   Her home sleep test was performed on 02-25-2021 and showed a sleep time of 7 hours with 60% REM sleep, her AHI was astronomically high at 86 apneas and hypopneas per hour.  The maximum oxygen saturation was 98% but the nadir was only 78% she had frequent [name] oxygen saturations but the total desaturation time was only 2.6 minutes.  She was prescribed a CPAP with 6 to 18 cm auto titration spectrum pressure 3 cm EPR and a mask of her choice.  The goal was to follow her today and see how she has been doing the 95th percentile pressure for this patient has been 7 cmH2O the residual AHI is only 3.7/h which is a good resolution so she she has been 90% compliant which gives 27 out of 30 days of use was 26 of those days over 4 hours and an average use at time on days used of 5.3 hours.  The  air leak that she initially presented with [place] in June has much decreased to the months of July. She switched to a FFM, F 20 Resmed.    She continues to use oxygen at 2 L/min bled into her auto CPAP machine. She has not found to have any more headaches  (!)but remains as fatigued and sleepy as before. The apnea is not promoting this hypersomnia or fatigue any longer, last month download of CPAP showed a residual AHI of 0.0/h on the new FFM !  She is still having insomnia:  her son is 89 and in 7th grade.     REVIEW  OF SYSTEMS: Out of a complete 14 system review of symptoms, the patient complains only of the following symptoms, chronic pain, insomnia, fatigue and all other reviewed systems are negative.  ESS: 5  ALLERGIES: Allergies  Allergen Reactions   Cephalexin Hives   Methotrexate Nausea And Vomiting    HOME MEDICATIONS: Outpatient Medications Prior to Visit  Medication Sig Dispense Refill   acetaminophen (TYLENOL) 325 MG tablet Take 650 mg by mouth every 6 (six) hours as needed for moderate pain.     aspirin 81 MG EC tablet Take 81 mg by mouth daily.     azaTHIOprine (IMURAN) 50 MG tablet Take 50 mg by mouth daily.      Belimumab (BENLYSTA) 200 MG/ML SOSY Inject 1 Dose into the skin every Monday.     calcium carbonate (OS-CAL - DOSED IN MG OF ELEMENTAL CALCIUM) 1250 (500 Ca) MG tablet Take 2 tablets by mouth daily.     cholecalciferol (VITAMIN D3) 25 MCG (1000 UNIT) tablet Take 1,000 Units by mouth daily.     Cyanocobalamin (B-12) 1000 MCG TABS Take 1,000 mcg by mouth daily.     Folic Acid (FOLATE PO) Take 666 mcg by mouth daily.     furosemide (LASIX) 20 MG tablet Take 1 tablet (20 mg total) by mouth daily. Take 40mg  (2 tablets) by mouth alternating with 20mg  (1 tablet) by mouth daily. 135 tablet 5   Magnesium 250 MG TABS Take 250 mg by mouth daily.     metoprolol succinate (TOPROL-XL) 25 MG 24 hr tablet TAKE 1/2 TABLET DAILY 45 tablet 3   Multiple Vitamin (MULTIVITAMIN) tablet Take 2 tablets by mouth daily.     potassium chloride SA (KLOR-CON M20) 20 MEQ tablet Take 1 tablet (20 mEq total) by mouth daily. 180 tablet 3   predniSONE (DELTASONE) 5 MG tablet Take 5 mg by mouth daily.     Tiotropium Bromide Monohydrate (SPIRIVA RESPIMAT) 2.5 MCG/ACT AERS Inhale 2 puffs into the lungs daily. 4 g 5   valACYclovir (VALTREX) 1000 MG tablet Take 1,000 mg by mouth daily as needed (fever blisters).     warfarin (COUMADIN) 5 MG tablet Take as directed by the anticoagulation clinic. 135 tablet 3   No  facility-administered medications prior to visit.    PAST MEDICAL HISTORY: Past Medical History:  Diagnosis Date   Abscess of right breast    sternal osteomyeliitis, pseudomonas   C. 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    Cataract    Mixed OU   CHF (congestive heart failure) (HCC)    secondary to mmoderate mitral stenosis and moderate mitral regurgitation in the setting of rheumatic vlave disease as well as severe pulmonary HTN likely due to a  combination of MV disease and pulmoanary arterial HTN in the setting of collagen vascular disease. Not valvuloplasty candidate due to MR. patient had MV replacement with mechanical MV in 8/11.   CHF (congestive heart failure) (HCC)    Echo post-op showed normally functioning mechanical MV, EF 60-65% with septal bounce, small mobile structure in the mid ventricle (? loose papillary muscle), RV-RA gradient 22 mmHg. Echo (10/11): EF 60-65%, normally functioning mechanical mitral vlavue.    Hypertensive retinopathy    OU   Mixed connective tissue disease ([place])    diagnosis 1990. 8/10 ANA positive, anti-dDNA positive, anti-SCL70 negative   Nephrotic syndrome    secondary to SLE   Pulmonary HTN ([place])    porbably secondary to combination of rheumatic MV disease and pulmonary arterial HYN form vollagen vascalr disease. CTA chest (7/10) with no evidence for pulmonary emboli or chronic pulmonary emboli. PA pressure 72/28 mean 50 with PVR 5.7 WU on initial RHC, PA pressure 45/21 mean 30 PVR 2.3 WU on f/u RHC on Revatio 80 mg three times a day (1/11).  After MV surgery, Revatio stopped.   Pulmonary HTN (HCC)    increased  dyspnea. Repeat RHC with mean RA 6, PA 42/20 mean 29, PVR 4.8 WU,  2.3. Revatio 20 mg  3x a day restarted. 3/11 6 min walk 464m. 2/12 6 min walk 427 m.    Raynaud's syndrome    Restrictive  lung disease    secondary to MCTD. PFTs 03/06/99 VC 47% DLCO 26%. PFTs 09/12/08 VC 43% DLCO 45%. PFTs 6/10 FVC 37%, FEV1 34%, ratio 69%, TLC 50%. using home O2 periodically   Rheumatic fever    Warfarin anticoagulation    for mechanical MV, goal INR 2.5-3.5    PAST SURGICAL HISTORY: Past Surgical History:  Procedure Laterality Date   BREAST CYST ASPIRATION  03/09/2012   CESAREAN SECTION     COLONOSCOPY     RIGHT HEART CATH N/A 08/10/2021   Procedure: RIGHT HEART CATH;  Surgeon: Larey Dresser, MD;  Location: [facility];  Service: Cardiovascular;  Laterality: N/A;   s/p mitral valvue replacement  05/13/2010   by Dr. Roxy Manns by minimally invasive window.    TUBAL LIGATION      FAMILY HISTORY: Family History  Problem Relation Age of Onset   Emphysema Mother        smoker   COPD Mother    Heart disease Maternal Grandmother        side of family not given   Diabetes Maternal Grandmother    Hypertension Father    Osteoarthritis Father    Sleep apnea Father    Breast cancer Maternal Aunt     SOCIAL HISTORY: Social History   Socioeconomic History   Marital status: Single    Spouse name: Not on file   Number of children: Not on file   Years of education: Not on file   Highest education level: Not on file  Occupational History   Not on file  Tobacco Use   Smoking status: Never   Smokeless tobacco: Never  Substance and Sexual Activity   Alcohol use: No   Drug use: No   Sexual activity: Not Currently    Birth control/protection: Other-see comments    Comment: BTL  Other Topics Concern   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    Social Determinants of Health   Financial Resource Strain: Not  on file  Food Insecurity: Not on file  Transportation Needs: Not on file  Physical Activity: Not on file  Stress: Not on file  Social Connections: Not on file  Intimate Partner Violence: Not on file      PHYSICAL EXAM  There were no vitals filed for this visit. There is no height or weight on file to calculate BMI.  Generalized: Well developed, in no acute distress  Cardiology: normal rate and rhythm, no murmur noted Respiratory: clear to auscultation bilaterally  Neurological examination  Mentation: Alert oriented to time, place, history taking. Follows all commands speech and language fluent Cranial nerve II-XII: Pupils were equal round reactive to light. Extraocular movements were full, visual field were full  Motor: The motor testing reveals 5 over 5 strength of all 4 extremities. Good symmetric motor tone is noted throughout.  Gait and station: Gait is normal.    DIAGNOSTIC DATA (LABS, IMAGING, TESTING) - I reviewed patient records, labs, notes, testing and imaging myself where available.  No flowsheet data found.   Lab Results  Component Value Date   WBC 3.5 (L) 07/21/2021   HGB 13.3 08/10/2021   HGB 12.6 08/10/2021   HCT 39.0 08/10/2021   HCT 37.0 08/10/2021   MCV 100.0 07/21/2021   PLT 210 07/21/2021      Component Value Date/Time   NA 139 09/11/2021 1030   K 4.8 09/11/2021 1030   CL 107 09/11/2021 1030   CO2 29 09/11/2021 1030   GLUCOSE 91 09/11/2021 1030   BUN 9 09/11/2021 1030   CREATININE 0.66 09/11/2021 1030   CALCIUM 8.8 (L) 09/11/2021 1030   PROT 6.5 07/01/2010 0908   ALBUMIN 3.2 (L) 07/01/2010 0908   AST 29 07/01/2010 0908   ALT 16 07/01/2010 0908   ALKPHOS 80 07/01/2010 0908   BILITOT 0.5 07/01/2010 0908   GFRNONAA >60 09/11/2021 1030   GFRAA >60 05/22/2019 1233   Lab Results  Component Value Date   CHOL 149 01/14/2016   HDL 38 (L) 01/14/2016   LDLCALC 92 01/14/2016   TRIG 93 01/14/2016   CHOLHDL 3.9 01/14/2016   Lab Results  Component Value Date   HGBA1C  05/11/2010    5.5 (NOTE)                                                                       According to the ADA Clinical Practice Recommendations for 2011, when HbA1c is used  as a screening test:   >=6.5%   Diagnostic of Diabetes Mellitus           (if abnormal result  is confirmed)  5.7-6.4%   Increased risk of developing Diabetes Mellitus  References:Diagnosis and Classification of Diabetes Mellitus,Diabetes VHQI,6962,95(MWUXL 1):S62-S69 and Standards of Medical Care in         Diabetes - 2011,Diabetes Care,2011,34  (Suppl 1):S11-S61.   No results found for: VITAMINB12 Lab Results  Component Value Date   TSH 4.08 07/01/2010     ASSESSMENT AND PLAN 56 y.o. year old female  has a past medical history of Abscess of right breast, C. difficile diarrhea (1/12), CAD (coronary artery disease), Cardiac tamponade, Cataract, CHF (congestive heart failure) ([place]), CHF (congestive heart failure) ([place]), Hypertensive retinopathy, Mixed connective tissue  disease ([place]), Nephrotic syndrome, Pulmonary HTN ([place]), Pulmonary HTN ([place]), Raynaud's syndrome, Restrictive lung disease, Rheumatic fever, and Warfarin anticoagulation. here with     ICD-10-CM   1. Severe obstructive sleep apnea-hypopnea syndrome  G47.33         Katie [name] has been frustrated with CPAP usage due to chronic pain and insomnia. She does feel that she is better rested and has improved sleep quality when using CPAP consistently. Headaches have resolved on CPAP. ESS 5. She does express a desire to continue using CPAP. Compliance report reveals sub optimal compliance. She was encouraged to continue using CPAP nightly and for greater than 4 hours each night. We have discussed benefits of using CPAP and association of sleep apnea in worsening pain, insomnia, nocturia, headaches, etc. Risks of untreated sleep apnea review and education materials provided. Healthy lifestyle habits encouraged. She will follow up in 6 months, sooner if needed. She verbalizes understanding and agreement with this plan.    No orders of the defined types were placed in this encounter.    No orders of the defined types were placed in  this encounter.     Debbora Presto, FNP-C 10/13/2021, 2:36 PM [facility] [address] [place],  [address] [phone]

## 2021-10-29 ENCOUNTER — Other Ambulatory Visit: Payer: Self-pay | Admitting: Registered Nurse

## 2021-10-29 DIAGNOSIS — N200 Calculus of kidney: Secondary | ICD-10-CM

## 2021-11-03 ENCOUNTER — Telehealth (HOSPITAL_COMMUNITY): Payer: Self-pay | Admitting: *Deleted

## 2021-11-03 NOTE — Telephone Encounter (Signed)
Pt left vm stating she has been experiencing shortness of breath with exertion and asked f she needs to start sildenafil.   Routed to [place]

## 2021-11-03 NOTE — Telephone Encounter (Signed)
Get echo then office followup. Check BNP.

## 2021-11-04 NOTE — Telephone Encounter (Signed)
Pt left VM today c/o sob  Attempted to call pt back and Left message to call back

## 2021-11-05 ENCOUNTER — Other Ambulatory Visit (HOSPITAL_COMMUNITY): Payer: Self-pay | Admitting: Cardiology

## 2021-11-05 NOTE — Telephone Encounter (Signed)
Left vm for pt to return call to schedule echo and f/u

## 2021-11-06 ENCOUNTER — Ambulatory Visit
Admission: RE | Admit: 2021-11-06 | Discharge: 2021-11-06 | Disposition: A | Discharge status: Home / Self Care | Payer: BC Managed Care – PPO | Source: Ambulatory Visit | Attending: Registered Nurse | Admitting: Registered Nurse

## 2021-11-06 DIAGNOSIS — N200 Calculus of kidney: Secondary | ICD-10-CM

## 2022-01-08 ENCOUNTER — Ambulatory Visit (HOSPITAL_COMMUNITY)
Admission: EM | Admit: 2022-01-08 | Discharge: 2022-01-08 | Disposition: A | Discharge status: Home / Self Care | Payer: BC Managed Care – PPO | Attending: Internal Medicine | Admitting: Internal Medicine

## 2022-01-08 ENCOUNTER — Encounter (HOSPITAL_COMMUNITY): Payer: Self-pay

## 2022-01-08 DIAGNOSIS — T148XXA Other injury of unspecified body region, initial encounter: Secondary | ICD-10-CM

## 2022-01-08 DIAGNOSIS — Z7901 Long term (current) use of anticoagulants: Secondary | ICD-10-CM

## 2022-01-08 DIAGNOSIS — R791 Abnormal coagulation profile: Secondary | ICD-10-CM

## 2022-01-08 LAB — PROTIME-INR
INR: 4.1 (ref 0.8–1.2)
Prothrombin Time: 39.7 seconds — ABNORMAL HIGH (ref 11.4–15.2)

## 2022-01-08 NOTE — Discharge Instructions (Addendum)
I have checked your INR today to see if your Coumadin dose needs to be adjusted.  I will call you with these results and any recommendations.  In the meantime, you can use ice on the bruised areas.  Should you experience any abnormal bleeding or worsening bruising you will need to go directly to the emergency department. ?

## 2022-01-08 NOTE — ED Provider Notes (Signed)
?[facility] ? ? ? ?CSN: [id number] ?Arrival date & time: 01/08/22  1708 ? ? ?  ? ?History   ?Chief Complaint ?Chief Complaint  ?Patient presents with  ? arm bruising  ? ? ?HPI ?Katie [name] is a 56 y.o. female reports to urgent care today with complaints of swelling and bruising to right arm x3 days.  Patient reports noticing significant bruising to right upper arm and antecubital space 3 days ago becoming progressively worse.  Patient denies any recent trauma to affected arm.  States she did lift a heavy case of water on Saturday prior.  Patient is on chronic anticoagulation with Coumadin.  States her INR was supratherapeutic 2 weeks ago at which time her Coumadin dose was adjusted.  She was due to have levels rechecked on Tuesday but did not follow-up for that appointment.  Patient denies any abnormal bleeding or other unusual bruising.  No recent fever or chills, chest pain, shortness of breath. ? ? ? ?Past Medical History:  ?Diagnosis Date  ? Abscess of right breast   ? sternal osteomyeliitis, pseudomonas  ? C. difficile diarrhea 1/12  ? CAD (coronary artery disease)   ? LHC (9/10): no angiographic CAD  ? Cardiac tamponade   ? 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   ? Cataract   ? Mixed OU  ? CHF (congestive heart failure) ([place])   ? secondary to mmoderate mitral stenosis and moderate mitral regurgitation in the setting of rheumatic vlave disease as well as severe pulmonary HTN likely due to a combination of MV disease and pulmoanary arterial HTN in the setting of collagen vascular disease. Not valvuloplasty candidate due to MR. patient had MV replacement with mechanical MV in 8/11.  ? CHF (congestive heart failure) ([place])   ? Echo post-op showed normally functioning mechanical MV, EF 60-65% with septal bounce, small mobile structure in the mid ventricle (? loose papillary muscle), RV-RA gradient 22 mmHg. Echo (10/11): EF 60-65%, normally functioning mechanical  mitral vlavue.   ? Hypertensive retinopathy   ? OU  ? Mixed connective tissue disease ([place])   ? diagnosis 1990. 8/10 ANA positive, anti-dDNA positive, anti-SCL70 negative  ? Nephrotic syndrome   ? secondary to SLE  ? Pulmonary HTN ([place])   ? porbably secondary to combination of rheumatic MV disease and pulmonary arterial HYN form vollagen vascalr disease. CTA chest (7/10) with no evidence for pulmonary emboli or chronic pulmonary emboli. PA pressure 72/28 mean 50 with PVR 5.7 WU on initial RHC, PA pressure 45/21 mean 30 PVR 2.3 WU on f/u RHC on Revatio 80 mg three times a day (1/11).  After MV surgery, Revatio stopped.  ? Pulmonary HTN ([place])   ? increased  dyspnea. Repeat RHC with mean RA 6, PA 42/20 mean 29, PVR 4.8 WU,  2.3. Revatio 20 mg  3x a day restarted. 3/11 6 min walk 452m 2/12 6 min walk 427 m.   ? Raynaud's syndrome   ? Restrictive lung disease   ? secondary to MCTD. PFTs 03/06/99 VC 47% DLCO 26%. PFTs 09/12/08 VC 43% DLCO 45%. PFTs 6/10 FVC 37%, FEV1 34%, ratio 69%, TLC 50%. using home O2 periodically  ? Rheumatic fever   ? Warfarin anticoagulation   ? for mechanical MV, goal INR 2.5-3.5  ? ? ?Patient Active Problem List  ? Diagnosis Date Noted  ? Chronic discoid lupus erythematosus 06/02/2021  ? Severe obstructive sleep apnea-hypopnea syndrome 06/02/2021  ? Fatigue due to exposure 06/02/2021  ?  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 Intermittent lightheadedness 01/27/2021  ? Insomnia w/ sleep apnea 04/22/2016  ? Hypoxemia 04/22/2016  ? Pulmonary hypertension due to mitral valve disease ([place]) 04/22/2016  ? Lupus anticoagulant disorder ([place]) 04/22/2016  ? H/O mitral valve replacement with mechanical valve 04/22/2016  ? Paroxysmal spells 04/22/2016  ? Pericardial effusion with cardiac tamponade 04/22/2016  ? Chronic systolic congestive heart failure ([place]) 04/22/2016  ? Mixed connective tissue disease ([place])   ? Nephrotic syndrome   ? CHF (congestive heart failure) ([place])   ? Pulmonary HTN ([place])   ? Rheumatic fever    ? CAD (coronary artery disease)   ? Warfarin anticoagulation   ? Cardiac tamponade   ? Abscess of right breast   ? C. difficile diarrhea   ? Upper airway cough syndrome 11/04/2011  ? Long term (current) use of anticoagulants 01/04/2011  ? PSEUDOMONAS INFECTION 09/29/2010  ? MASTITIS 09/29/2010  ? ACUTE OSTEOMYELITIS OTHER SPECIFIED SITE 09/29/2010  ? CHEST PAIN-UNSPECIFIED 08/03/2010  ? MITRAL STENOSIS 10/27/2009  ? PRIMARY PULMONARY HYPERTENSION 09/09/2009  ? MITRAL STENOSIS WITH INSUFFICIENCY 08/21/2009  ? Mitral valve disorder 06/20/2009  ? CHRONIC DIASTOLIC HEART FAILURE 54/00/8676  ? RESPIRATORY FAILURE, CHRONIC 04/25/2009  ? NEPHROTIC SYND W/OTH PATHAL LES DZ CLASS ELSW 04/25/2009  ? PULMONARY HYPERTENSION, SECONDARY 04/11/2009  ? RAYNAUDS SYNDROME 08/01/2008  ? PULMONARY FIBROSIS ILD POST INFLAMMATORY CHRONIC 08/01/2008  ? SLE 08/01/2008  ? ? ?Past Surgical History:  ?Procedure Laterality Date  ? BREAST CYST ASPIRATION  03/09/2012  ? CESAREAN SECTION    ? COLONOSCOPY    ? RIGHT HEART CATH N/A 08/10/2021  ? Procedure: RIGHT HEART CATH;  Surgeon: Larey Dresser, MD;  Location: [facility];  Service: Cardiovascular;  Laterality: N/A;  ? s/p mitral valvue replacement  05/13/2010  ? by Dr. Roxy Manns by minimally invasive window.   ? TUBAL LIGATION    ? ? ?OB History   ? ? Gravida  ?3  ? Para  ?0  ? Term  ?   ? Preterm  ?0  ? AB  ?1  ? Living  ?1  ?  ? ? SAB  ?1  ? IAB  ?   ? Ectopic  ?   ? Multiple  ?   ? Live Births  ?   ?   ?  ?  ? ? ? ?Home Medications   ? ?Prior to Admission medications   ?Medication Sig Start Date End Date Taking? Authorizing Provider  ?acetaminophen (TYLENOL) 325 MG tablet Take 650 mg by mouth every 6 (six) hours as needed for moderate pain.    [provider]  ?aspirin 81 MG EC tablet Take 81 mg by mouth daily.    [provider]  ?azaTHIOprine (IMURAN) 50 MG tablet Take 50 mg by mouth daily.     [provider]  ?Belimumab (BENLYSTA) 200 MG/ML SOSY Inject 1  Dose into the skin every Monday.    [provider]  ?calcium carbonate (OS-CAL - DOSED IN MG OF ELEMENTAL CALCIUM) 1250 (500 Ca) MG tablet Take 2 tablets by mouth daily.    [provider]  ?cholecalciferol (VITAMIN D3) 25 MCG (1000 UNIT) tablet Take 1,000 Units by mouth daily.    [provider]  ?Cyanocobalamin (B-12) 1000 MCG TABS Take 1,000 mcg by mouth daily.    [provider]  ?Folic Acid (FOLATE PO) Take 666 mcg by mouth daily.    [provider]  ?furosemide (LASIX) 20 MG  tablet Take 1 tablet (20 mg total) by mouth daily. Take '40mg'$  (2 tablets) by mouth alternating with '20mg'$  (1 tablet) by mouth daily. 08/10/21   Larey Dresser, MD  ?Magnesium 250 MG TABS Take 250 mg by mouth daily.    [provider]  ?metoprolol succinate (TOPROL-XL) 25 MG 24 hr tablet TAKE 1/2 TABLET DAILY 11/05/21   Larey Dresser, MD  ?Multiple Vitamin (MULTIVITAMIN) tablet Take 2 tablets by mouth daily.    [provider]  ?potassium chloride SA (KLOR-CON M20) 20 MEQ tablet Take 1 tablet (20 mEq total) by mouth daily. 09/11/21   Larey Dresser, MD  ?predniSONE (DELTASONE) 5 MG tablet Take 5 mg by mouth daily.    [provider]  ?Tiotropium Bromide Monohydrate (SPIRIVA RESPIMAT) 2.5 MCG/ACT AERS Inhale 2 puffs into the lungs daily. 06/11/21   Brand Males, MD  ?valACYclovir (VALTREX) 1000 MG tablet Take 1,000 mg by mouth daily as needed (fever blisters). 08/14/19   [provider]  ?warfarin (COUMADIN) 5 MG tablet Take as directed by the anticoagulation clinic. 03/23/11   Larey Dresser, MD  ? ? ?Family History ?Family History  ?Problem Relation Age of Onset  ? Emphysema Mother   ?     smoker  ? COPD Mother   ? Heart disease Maternal Grandmother   ?     side of family not given  ? Diabetes Maternal Grandmother   ? Hypertension Father   ? Osteoarthritis Father   ? Sleep apnea Father   ? Breast cancer Maternal Aunt   ? ? ?Social History ?Social  History  ? ?Tobacco Use  ? Smoking status: Never  ? Smokeless tobacco: Never  ?Substance Use Topics  ? Alcohol use: No  ? Drug use: No  ? ? ? ?Allergies   ?Cephalexin and Methotrexate ? ? ?Review of Systems ?As

## 2022-01-08 NOTE — ED Triage Notes (Signed)
4 day h/o right arm pain and 3 days of swelling bruising. Denies n/t. No injuries reported, but patients reports that she lifted a case of water on Saturday. Pt is on coumadin. ?

## 2022-01-11 ENCOUNTER — Other Ambulatory Visit: Payer: Self-pay | Admitting: Nephrology

## 2022-01-11 ENCOUNTER — Other Ambulatory Visit (HOSPITAL_COMMUNITY): Payer: Self-pay | Admitting: Cardiology

## 2022-01-12 ENCOUNTER — Other Ambulatory Visit: Payer: Self-pay | Admitting: Nephrology

## 2022-01-12 DIAGNOSIS — N261 Atrophy of kidney (terminal): Secondary | ICD-10-CM

## 2022-01-13 ENCOUNTER — Encounter (INDEPENDENT_AMBULATORY_CARE_PROVIDER_SITE_OTHER): Payer: BC Managed Care – PPO | Admitting: Ophthalmology

## 2022-01-14 ENCOUNTER — Ambulatory Visit: Payer: BC Managed Care – PPO | Admitting: Sports Medicine

## 2022-01-14 ENCOUNTER — Ambulatory Visit: Payer: Self-pay

## 2022-01-14 VITALS — BP 126/80 | Ht 65.0 in | Wt 186.0 lb

## 2022-01-14 DIAGNOSIS — M7918 Myalgia, other site: Secondary | ICD-10-CM

## 2022-01-14 DIAGNOSIS — Z7901 Long term (current) use of anticoagulants: Secondary | ICD-10-CM | POA: Diagnosis not present

## 2022-01-14 DIAGNOSIS — M25521 Pain in right elbow: Secondary | ICD-10-CM

## 2022-01-14 DIAGNOSIS — T148XXA Other injury of unspecified body region, initial encounter: Secondary | ICD-10-CM | POA: Diagnosis not present

## 2022-01-14 NOTE — Progress Notes (Signed)
? ? Benito Mccreedy D.Merril Abbe ?[facility] ?[place] ?Phone: [phone] ?  ?Assessment and Plan:   ?  ?1. Hematoma ?2. Warfarin anticoagulation ?3. Right elbow pain ?4. Brachialis muscle tenderness ?-Acute, complicated, initial sports medicine visit ?- Concern for partial versus complete rupture of right brachialis/brachioradialis based on physical exam, ultrasound, HPI ?- Patient cannot recall MOI, and has not recently been on medications that would make patient more prone to tendon rupture.  Patient is chronically anticoagulated on warfarin ?- We will further evaluate degree of muscular disruption with MRI.  Patient had x-ray with [facility] that was unremarkable per patient. ?- Start HEP focusing on maintaining elbow range of motion ? ?Sports Medicine: Musculoskeletal Ultrasound. ?Exam: Limited US of right arm ?Diagnosis: Right arm/elbow pain and bruising ? ?US Findings: Hypoechoic changes and disruption of brachialis/brachioradialis muscle fibers along the midpoint of humerus that coincide with patient's TTP and ecchymosis.  Biceps musculature and tendon appear intact ? ?US Impression:  ?Partial versus complete brachialis/brachioradialis tear ?  ?Pertinent previous records reviewed include ER visit from 01/08/2022, [place] note from 01/11/2022, [place] note from 01/12/2022 ?  ?Follow Up: 3 days after MRI to review results ?  ?Subjective:   ?I, [name], am serving as a Education administrator for [employer] ? ?Chief Complaint: right bicep tear with hematoma ? ?HPI:  ? ?01/14/22 ?Patient is a 56 year old female complaining of right bicep pain. Patient states cant extender her arm has lots of bruising , 2 weeks ago she thinks she bumped into something and then the bruising came and the bruising became worst over the week, no numbness or tingling , has been taking tylenol, decreased ROM,  ? ?Relevant  Historical Information: Complicated past medical history including pulmonary hypertension, cardiomyopathies, CAD, CHF, lupus, warfarin anticoagulation ? ?Additional pertinent review of systems negative. ? ? ?Current Outpatient Medications:  ?  acetaminophen (TYLENOL) 325 MG tablet, Take 650 mg by mouth every 6 (six) hours as needed for moderate pain., Disp: , Rfl:  ?  aspirin 81 MG EC tablet, Take 81 mg by mouth daily., Disp: , Rfl:  ?  azaTHIOprine (IMURAN) 50 MG tablet, Take 50 mg by mouth daily. , Disp: , Rfl:  ?  Belimumab (BENLYSTA) 200 MG/ML SOSY, Inject 1 Dose into the skin every Monday., Disp: , Rfl:  ?  calcium carbonate (OS-CAL - DOSED IN MG OF ELEMENTAL CALCIUM) 1250 (500 Ca) MG tablet, Take 2 tablets by mouth daily., Disp: , Rfl:  ?  cholecalciferol (VITAMIN D3) 25 MCG (1000 UNIT) tablet, Take 1,000 Units by mouth daily., Disp: , Rfl:  ?  Cyanocobalamin (B-12) 1000 MCG TABS, Take 1,000 mcg by mouth daily., Disp: , Rfl:  ?  Folic Acid (FOLATE PO), Take 666 mcg by mouth daily., Disp: , Rfl:  ?  furosemide (LASIX) 20 MG tablet, Take 1 tablet (20 mg total) by mouth daily. Take '40mg'$  (2 tablets) by mouth alternating with '20mg'$  (1 tablet) by mouth daily., Disp: 135 tablet, Rfl: 5 ?  Magnesium 250 MG TABS, Take 250 mg by mouth daily., Disp: , Rfl:  ?  metoprolol succinate (TOPROL-XL) 25 MG 24 hr tablet, TAKE 1/2 TABLET DAILY, Disp: 45 tablet, Rfl: 3 ?  Multiple Vitamin (MULTIVITAMIN) tablet, Take 2 tablets by mouth daily., Disp: , Rfl:  ?  potassium chloride SA (KLOR-CON M20) 20 MEQ tablet, Take 1 tablet (20 mEq total) by mouth daily., Disp: 180 tablet, Rfl:  3 ?  predniSONE (DELTASONE) 5 MG tablet, Take 5 mg by mouth daily., Disp: , Rfl:  ?  Tiotropium Bromide Monohydrate (SPIRIVA RESPIMAT) 2.5 MCG/ACT AERS, Inhale 2 puffs into the lungs daily., Disp: 4 g, Rfl: 5 ?  valACYclovir (VALTREX) 1000 MG tablet, Take 1,000 mg by mouth daily as needed (fever blisters)., Disp: , Rfl:  ?  warfarin (COUMADIN) 5 MG tablet,  Take as directed by the anticoagulation clinic., Disp: 135 tablet, Rfl: 3  ? ?Objective:   ?  ?Vitals:  ? 01/14/22 1113  ?BP: 126/80  ?Weight: 186 lb (84.4 kg)  ?Height: '5\' 5"'$  (1.651 m)  ?  ?  ?Body mass index is 30.95 kg/m?.  ?  ?Physical Exam:   ? ?General: Appears well, no acute distress, nontoxic and pleasant ?Neck: FROM, no pain ?Neuro: sensation is intact distally with no deficits, strenghth is 5/5 in elbow flexors/extenders/supinator/pronators and wrist flexors/extensors ?Psych: no evidence of anxiety or depression ? ?Right ELBOW: no deformity ?Ecchymosis along anterior arm, elbow with mild edema ? ?ROM:20-140, supination and pronation 90 ?TTP biceps musculature, antecubital fossa ?NTTP over triceps, ticeps tendon, olecronon, lat epicondyle, medial epicondyle, biceps tendon, supinator, pronator ?Negative tinnels over cubital tunnel ?No pain with resisted wrist and middle digit extension ?No pain with resisted wrist flexion ?No pain with resisted supination ?No pain with resisted pronation ?Negative valgus stress ?Negative varus stress ?Negative milking maneuver  ?No pain with resisted elbow extension and full strength ?Pain with resisted elbow flexion and weakness of elbow flexion with hand in pronation ? ?Electronically signed by:  ?Benito Mccreedy D.Merril Abbe ?[facility] ?12:47 PM 01/14/22 ?

## 2022-01-14 NOTE — Patient Instructions (Addendum)
Good to see you ?Humerus MRI suspect brachioradialis tear  ?Elbow ROM  ?Recommend that you don't carry more than 5 lbs with right arm  ?Follow up 3 days after MRI to discuss results  ? ? ?

## 2022-01-15 NOTE — Progress Notes (Shared)
?[facility] Note ? ?01/19/2022 ? ?  ? ?CHIEF COMPLAINT ?Patient presents for No chief complaint on file. ? ? ? ?HISTORY OF PRESENT ILLNESS: ?Katie [name] is a 56 y.o. female who presents to the clinic today for:  ? ? ?Pt states no changes in vision, she is on '5mg'$  PO Prednisone for lupus ? ?Referring physician: ?Deland Pretty, MD ?[place],  [place] [address] ? ?HISTORICAL INFORMATION:  ? ?Selected notes from the [place] ?Referred by Dr. Quentin Ore for concern of plaquenil toxicity ?LEE:  ?Ocular Hx- ?PMH-  ? ?CURRENT MEDICATIONS: ?No current outpatient medications on file. (Ophthalmic Drugs)  ? ?No current facility-administered medications for this visit. (Ophthalmic Drugs)  ? ?Current Outpatient Medications (Other)  ?Medication Sig  ? acetaminophen (TYLENOL) 325 MG tablet Take 650 mg by mouth every 6 (six) hours as needed for moderate pain.  ? aspirin 81 MG EC tablet Take 81 mg by mouth daily.  ? azaTHIOprine (IMURAN) 50 MG tablet Take 50 mg by mouth daily.   ? Belimumab (BENLYSTA) 200 MG/ML SOSY Inject 1 Dose into the skin every Monday.  ? calcium carbonate (OS-CAL - DOSED IN MG OF ELEMENTAL CALCIUM) 1250 (500 Ca) MG tablet Take 2 tablets by mouth daily.  ? cholecalciferol (VITAMIN D3) 25 MCG (1000 UNIT) tablet Take 1,000 Units by mouth daily.  ? Cyanocobalamin (B-12) 1000 MCG TABS Take 1,000 mcg by mouth daily.  ? Folic Acid (FOLATE PO) Take 666 mcg by mouth daily.  ? furosemide (LASIX) 20 MG tablet Take 1 tablet (20 mg total) by mouth daily. Take '40mg'$  (2 tablets) by mouth alternating with '20mg'$  (1 tablet) by mouth daily.  ? Magnesium 250 MG TABS Take 250 mg by mouth daily.  ? metoprolol succinate (TOPROL-XL) 25 MG 24 hr tablet TAKE 1/2 TABLET DAILY  ? Multiple Vitamin (MULTIVITAMIN) tablet Take 2 tablets by mouth daily.  ? potassium chloride SA (KLOR-CON M20) 20 MEQ tablet Take 1 tablet (20 mEq total) by mouth daily.  ? predniSONE  (DELTASONE) 5 MG tablet Take 5 mg by mouth daily.  ? Tiotropium Bromide Monohydrate (SPIRIVA RESPIMAT) 2.5 MCG/ACT AERS Inhale 2 puffs into the lungs daily.  ? valACYclovir (VALTREX) 1000 MG tablet Take 1,000 mg by mouth daily as needed (fever blisters).  ? warfarin (COUMADIN) 5 MG tablet Take as directed by the anticoagulation clinic.  ? ?No current facility-administered medications for this visit. (Other)  ? ?REVIEW OF SYSTEMS: ? ? ?ALLERGIES ?Allergies  ?Allergen Reactions  ? Cephalexin Hives  ? Methotrexate Nausea And Vomiting  ? ?PAST MEDICAL HISTORY ?Past Medical History:  ?Diagnosis Date  ? Abscess of right breast   ? sternal osteomyeliitis, pseudomonas  ? C. difficile diarrhea 1/12  ? CAD (coronary artery disease)   ? LHC (9/10): no angiographic CAD  ? Cardiac tamponade   ? 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   ? Cataract   ? Mixed OU  ? CHF (congestive heart failure) ([place])   ? secondary to mmoderate mitral stenosis and moderate mitral regurgitation in the setting of rheumatic vlave disease as well as severe pulmonary HTN likely due to a combination of MV disease and pulmoanary arterial HTN in the setting of collagen vascular disease. Not valvuloplasty candidate due to MR. patient had MV replacement with mechanical MV in 8/11.  ? CHF (congestive heart failure) ([place])   ? Echo post-op showed normally functioning mechanical MV, EF 60-65% with septal  bounce, small mobile structure in the mid ventricle (? loose papillary muscle), RV-RA gradient 22 mmHg. Echo (10/11): EF 60-65%, normally functioning mechanical mitral vlavue.   ? Hypertensive retinopathy   ? OU  ? Mixed connective tissue disease ([place])   ? diagnosis 1990. 8/10 ANA positive, anti-dDNA positive, anti-SCL70 negative  ? Nephrotic syndrome   ? secondary to SLE  ? Pulmonary HTN ([place])   ? porbably secondary to combination of rheumatic MV disease and pulmonary arterial HYN form vollagen vascalr disease. CTA chest (7/10)  with no evidence for pulmonary emboli or chronic pulmonary emboli. PA pressure 72/28 mean 50 with PVR 5.7 WU on initial RHC, PA pressure 45/21 mean 30 PVR 2.3 WU on f/u RHC on Revatio 80 mg three times a day (1/11).  After MV surgery, Revatio stopped.  ? Pulmonary HTN ([place])   ? increased  dyspnea. Repeat RHC with mean RA 6, PA 42/20 mean 29, PVR 4.8 WU,  2.3. Revatio 20 mg  3x a day restarted. 3/11 6 min walk 443m 2/12 6 min walk 427 m.   ? Raynaud's syndrome   ? Restrictive lung disease   ? secondary to MCTD. PFTs 03/06/99 VC 47% DLCO 26%. PFTs 09/12/08 VC 43% DLCO 45%. PFTs 6/10 FVC 37%, FEV1 34%, ratio 69%, TLC 50%. using home O2 periodically  ? Rheumatic fever   ? Warfarin anticoagulation   ? for mechanical MV, goal INR 2.5-3.5  ? ?Past Surgical History:  ?Procedure Laterality Date  ? BREAST CYST ASPIRATION  03/09/2012  ? CESAREAN SECTION    ? COLONOSCOPY    ? RIGHT HEART CATH N/A 08/10/2021  ? Procedure: RIGHT HEART CATH;  Surgeon: MLarey Dresser MD;  [facility];  Service: Cardiovascular;  Laterality: N/A;  ? s/p mitral valvue replacement  05/13/2010  ? by Dr. ORoxy Mannsby minimally invasive window.   ? TUBAL LIGATION    ? ? ?FAMILY HISTORY ?Family History  ?Problem Relation Age of Onset  ? Emphysema Mother   ?     smoker  ? COPD Mother   ? Heart disease Maternal Grandmother   ?     side of family not given  ? Diabetes Maternal Grandmother   ? Hypertension Father   ? Osteoarthritis Father   ? Sleep apnea Father   ? Breast cancer Maternal Aunt   ? ? ?SOCIAL HISTORY ?Social History  ? ?Tobacco Use  ? Smoking status: Never  ? Smokeless tobacco: Never  ?Substance Use Topics  ? Alcohol use: No  ? Drug use: No  ?  ? ?  ?OPHTHALMIC EXAM: ? ?Not recorded ?  ? ? ?IMAGING AND PROCEDURES  ?Imaging and Procedures for '@TODAY'$ @ ? ? ?  ?  ? ?  ?ASSESSMENT/PLAN: ?  ICD-10-CM   ?1. 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  ?2. Long-term use of Plaquenil  Z79.899   ?  ?3. Essential hypertension  I10   ?  ?4.  Hypertensive retinopathy of both eyes  H35.033   ?  ?5. Anatomical narrow angle glaucoma  H40.039   ?  ?6. Combined forms of age-related cataract of both eyes  H25.813   ?  ? ?1. VMT OU ? - OCT shows persistent cystic changes from VMT (OD > OS) ? - pt denies significant vision changes or metamorphopsia ? - BCVA OD 20/30 (stable); OS improved to 20/25 from 20/30 ? - discussed findings, prognosis, including possible development of macular hole  ? -  cont amsler grid monitoring ? - f/u 6 months, sooner prn -- DFE, OCT ? ?2. History of Long term Plaquenil use ? - pt reports history of taking Plaquenil for lupus ('200mg'$  BID) for ~20 years ? - 400 mg/day --> 4.77 mg/kg/day ? - pt stopped taking plaquenil beginning of June 2021 ? 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 - OCT today shows ?early parafoveal ellipsoid thinning OU -- no frank EZ loss -- no significant change from prior  ? - discussed findings, prognosis and possibility of progression despite discontinuation of medication ? - recommend monitoring ? - f/u in 6 months, DFE, OCT ? ?3,4. Hypertensive retinopathy OU ? - discussed importance of tight BP control ? - monitor  ? ?5. Narrow angle glaucoma OU ? - IOP today: 18,16 ? - under the expert management of Dr. Kathlen Mody ? ?6. Mixed cataracts OU ? - The symptoms of cataract, surgical options, and treatments and risks were discussed with patient.   ? - under the expert management of Dr. Kathlen Mody. ? ?Ophthalmic Meds Ordered this visit:  ?No orders of the defined types were placed in this encounter. ? ?  ? ?No follow-ups on file. ? ?There are no Patient Instructions on file for this visit. ? ?This document serves as a record of services personally performed by Gardiner Sleeper, MD, PhD. It was created on their behalf by Leonie Douglas, an ophthalmic technician. The creation of this record is the provider's dictation and/or activities during the visit.   ? ?Electronically signed by: Leonie Douglas COA, 01/15/22  2:40 PM ? ? ?Gardiner Sleeper, M.D., Ph.D. ?[facility] Surgery of the Retina and Vitreous ?[place] ? ? ? ? ?Abbreviations: ?M myopia (nearsighted); A astigmatism; H hy

## 2022-01-16 ENCOUNTER — Ambulatory Visit (INDEPENDENT_AMBULATORY_CARE_PROVIDER_SITE_OTHER): Payer: BC Managed Care – PPO

## 2022-01-16 DIAGNOSIS — M25521 Pain in right elbow: Secondary | ICD-10-CM

## 2022-01-18 NOTE — Progress Notes (Signed)
? ? Katie [name] ?[facility] ?[place] ?Phone: [phone] ?  ?Assessment and Plan:   ?  ?1. Brachioradialis muscle tenderness ?2. Right elbow pain ?3. Warfarin anticoagulation ?4. Hematoma ?-Acute, uncomplicated, subsequent sports medicine visit ?- Overall patient has had a improvement in elbow pain and decreasing swelling in right arm since previous office visit ?- Reviewed MRI with patient in room showing hematoma in the interstitium of brachioradialis muscle likely correlating with partial muscular interstitial tear.  MRI read recommended we could follow-up in 2 to 3 months for repeat MRI if symptoms do not improve.  Incidental finding of partial full-thickness tear of anterior supraspinatus tendon ?- Suspect that patient had interstitial muscle tearing of brachioradialis when she was lifting a container and this likely created a hematoma with patient being on chronic anticoagulation with supratherapeutic warfarin dose ?- As patient is overall improving, swelling and hematoma seem to have slightly decreased, patient's strength is intact, we will continue with conservative therapy ?- Start Tylenol 500 to 1000 mg tablets 2-3 times a day for day-to-day pain relief ?- Start HEP for elbow ?- Start physical therapy for elbow ?- 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  Patient is unable to perform these duties at this time.  Given 4-week work note. ?  ?Pertinent previous records reviewed include MRI ?  ?Follow Up: 4 weeks for reevaluation.  If patient is improving with conservative therapy and PT, could consider release to restart work ?  ?Subjective:   ?I, Katie [name], am serving as a Education administrator for [employer] ?  ?Chief Complaint: right bicep tear with hematoma ?  ?HPI:  ?  ?01/14/22 ?Patient is a 56 year old female complaining of right bicep pain.  Patient states cant extender her arm has lots of bruising , 2 weeks ago she thinks she bumped into something and then the bruising came and the bruising became worst over the week, no numbness or tingling , has been taking tylenol, decreased ROM,  ? ?01/19/2022 ?Patient states that the pain is subsiding and the swelling has gone down  ?  ?Relevant Historical Information: Complicated past medical history including pulmonary hypertension, cardiomyopathies, CAD, CHF, lupus, warfarin anticoagulation ? ? ?Additional pertinent review of systems negative. ? ? ?Current Outpatient Medications:  ?  acetaminophen (TYLENOL) 325 MG tablet, Take 650 mg by mouth every 6 (six) hours as needed for moderate pain., Disp: , Rfl:  ?  aspirin 81 MG EC tablet, Take 81 mg by mouth daily., Disp: , Rfl:  ?  azaTHIOprine (IMURAN) 50 MG tablet, Take 50 mg by mouth daily. , Disp: , Rfl:  ?  Belimumab (BENLYSTA) 200 MG/ML SOSY, Inject 1 Dose into the skin every Monday., Disp: , Rfl:  ?  calcium carbonate (OS-CAL - DOSED IN MG OF ELEMENTAL CALCIUM) 1250 (500 Ca) MG tablet, Take 2 tablets by mouth daily., Disp: , Rfl:  ?  cholecalciferol (VITAMIN D3) 25 MCG (1000 UNIT) tablet, Take 1,000 Units by mouth daily., Disp: , Rfl:  ?  Cyanocobalamin (B-12) 1000 MCG TABS, Take 1,000 mcg by mouth daily., Disp: , Rfl:  ?  Folic Acid (FOLATE PO), Take 666 mcg by mouth daily., Disp: , Rfl:  ?  furosemide (LASIX) 20 MG tablet, Take 1 tablet (20 mg total) by mouth daily. Take '40mg'$  (2 tablets) by mouth alternating with '20mg'$  (1  tablet) by mouth daily., Disp: 135 tablet, Rfl: 5 ?  Magnesium 250 MG TABS, Take 250 mg by mouth daily., Disp: , Rfl:  ?  metoprolol succinate (TOPROL-XL) 25 MG 24 hr tablet, TAKE 1/2 TABLET DAILY, Disp: 45 tablet, Rfl: 3 ?  Multiple Vitamin (MULTIVITAMIN) tablet, Take 2 tablets by mouth daily., Disp: , Rfl:  ?  potassium chloride SA (KLOR-CON M20) 20 MEQ tablet, Take 1 tablet (20 mEq total) by mouth daily., Disp: 180 tablet, Rfl: 3 ?   predniSONE (DELTASONE) 5 MG tablet, Take 5 mg by mouth daily., Disp: , Rfl:  ?  Tiotropium Bromide Monohydrate (SPIRIVA RESPIMAT) 2.5 MCG/ACT AERS, Inhale 2 puffs into the lungs daily., Disp: 4 g, Rfl: 5 ?  valACYclovir (VALTREX) 1000 MG tablet, Take 1,000 mg by mouth daily as needed (fever blisters)., Disp: , Rfl:  ?  warfarin (COUMADIN) 5 MG tablet, Take as directed by the anticoagulation clinic., Disp: 135 tablet, Rfl: 3  ? ?Objective:   ?  ?Vitals:  ? 01/19/22 1515  ?BP: 132/72  ?Weight: 187 lb (84.8 kg)  ?Height: '5\' 5"'$  (1.651 m)  ?  ?  ?Body mass index is 31.12 kg/m?.  ?  ?Physical Exam:   ? ?General: Appears well, no acute distress, nontoxic and pleasant ?Neck: FROM, no pain ?Neuro: sensation is intact distally with no deficits, strenghth is 5/5 in elbow flexors/extenders/supinator/pronators and wrist flexors/extensors ?Psych: no evidence of anxiety or depression ?  ?Right ELBOW: no deformity ?Ecchymosis along anterior arm, elbow with mild edema ?  ?ROM: 20-140, supination and pronation 90 ?TTP mildly biceps musculature, antecubital fossa ?NTTP over triceps, ticeps tendon, olecronon, lat epicondyle, medial epicondyle, biceps tendon, supinator, pronator ?Negative tinnels over cubital tunnel ?No pain with resisted wrist and middle digit extension ?No pain with resisted wrist flexion ?No pain with resisted supination ?No pain with resisted pronation ?Negative valgus stress ?Negative varus stress ?Negative milking maneuver  ?No pain with resisted elbow extension and full strength ?Mild pain with resisted elbow flexion and weakness of elbow flexion with hand in pronation ? ? ?Electronically signed by:  ?Katie [name] ?[facility] ?3:37 PM 01/19/22 ?

## 2022-01-19 ENCOUNTER — Ambulatory Visit (INDEPENDENT_AMBULATORY_CARE_PROVIDER_SITE_OTHER): Payer: BC Managed Care – PPO | Admitting: Sports Medicine

## 2022-01-19 ENCOUNTER — Encounter (INDEPENDENT_AMBULATORY_CARE_PROVIDER_SITE_OTHER): Payer: BC Managed Care – PPO | Admitting: Ophthalmology

## 2022-01-19 ENCOUNTER — Ambulatory Visit (INDEPENDENT_AMBULATORY_CARE_PROVIDER_SITE_OTHER): Payer: BC Managed Care – PPO | Admitting: Ophthalmology

## 2022-01-19 ENCOUNTER — Encounter (INDEPENDENT_AMBULATORY_CARE_PROVIDER_SITE_OTHER): Payer: Self-pay | Admitting: Ophthalmology

## 2022-01-19 VITALS — BP 132/72 | Ht 65.0 in | Wt 187.0 lb

## 2022-01-19 DIAGNOSIS — T148XXA Other injury of unspecified body region, initial encounter: Secondary | ICD-10-CM

## 2022-01-19 DIAGNOSIS — H25813 Combined forms of age-related cataract, bilateral: Secondary | ICD-10-CM

## 2022-01-19 DIAGNOSIS — H40039 Anatomical narrow angle, unspecified eye: Secondary | ICD-10-CM

## 2022-01-19 DIAGNOSIS — Z7901 Long term (current) use of anticoagulants: Secondary | ICD-10-CM

## 2022-01-19 DIAGNOSIS — Z79899 Other long term (current) drug therapy: Secondary | ICD-10-CM

## 2022-01-19 DIAGNOSIS — I10 Essential (primary) hypertension: Secondary | ICD-10-CM | POA: Diagnosis not present

## 2022-01-19 DIAGNOSIS — H35033 Hypertensive retinopathy, bilateral: Secondary | ICD-10-CM

## 2022-01-19 DIAGNOSIS — H43823 Vitreomacular adhesion, bilateral: Secondary | ICD-10-CM

## 2022-01-19 DIAGNOSIS — M7918 Myalgia, other site: Secondary | ICD-10-CM | POA: Diagnosis not present

## 2022-01-19 DIAGNOSIS — M25521 Pain in right elbow: Secondary | ICD-10-CM | POA: Diagnosis not present

## 2022-01-19 NOTE — Patient Instructions (Addendum)
Good to see you  ?Work note provided 4 weeks  ?Referral to PT brachioradialis and elbow  ?4 week follow up  ? ?

## 2022-01-19 NOTE — Progress Notes (Signed)
?[facility] Note ? ?01/19/2022 ? ?  ? ?CHIEF COMPLAINT ?Patient presents for Retina Follow Up ? ? ? ?HISTORY OF PRESENT ILLNESS: ?Katie [name] is a 56 y.o. female who presents to the clinic today for:  ? ?HPI   ? ? Retina Follow Up   ?In both eyes.  This started 6 months ago.  I, the attending physician,  performed the HPI with the patient and updated documentation appropriately. ? ?  ?  ? ? Comments   ?Patient here for 6 months retina follow up for VMT OU. Patient states vision about the same. No eye pain. Has more floaters than usual. Comes more frequently. Injured right arm can't bend. ? ?  ?  ?Last edited by Bernarda Caffey, MD on 01/19/2022  8:20 AM.  ?  ? ?Pt states she has been off of Plaquenil for "quite a while", no visual complaints ? ?Referring physician: ?Lisabeth Pick, MD ?[address] ?[place],  [place] [address] ? ?HISTORICAL INFORMATION:  ? ?Selected notes from the [facility] ?Referred by Dr. Quentin Ore for concern of plaquenil toxicity ?LEE:  ?Ocular Hx- ?PMH-  ? ?CURRENT MEDICATIONS: ?No current outpatient medications on file. (Ophthalmic Drugs)  ? ?No current facility-administered medications for this visit. (Ophthalmic Drugs)  ? ?Current Outpatient Medications (Other)  ?Medication Sig  ? acetaminophen (TYLENOL) 325 MG tablet Take 650 mg by mouth every 6 (six) hours as needed for moderate pain.  ? aspirin 81 MG EC tablet Take 81 mg by mouth daily.  ? azaTHIOprine (IMURAN) 50 MG tablet Take 50 mg by mouth daily.   ? Belimumab (BENLYSTA) 200 MG/ML SOSY Inject 1 Dose into the skin every Monday.  ? calcium carbonate (OS-CAL - DOSED IN MG OF ELEMENTAL CALCIUM) 1250 (500 Ca) MG tablet Take 2 tablets by mouth daily.  ? cholecalciferol (VITAMIN D3) 25 MCG (1000 UNIT) tablet Take 1,000 Units by mouth daily.  ? Cyanocobalamin (B-12) 1000 MCG TABS Take 1,000 mcg by mouth daily.  ? Folic Acid (FOLATE PO) Take 666 mcg by mouth daily.  ? furosemide (LASIX) 20 MG  tablet Take 1 tablet (20 mg total) by mouth daily. Take '40mg'$  (2 tablets) by mouth alternating with '20mg'$  (1 tablet) by mouth daily.  ? Magnesium 250 MG TABS Take 250 mg by mouth daily.  ? metoprolol succinate (TOPROL-XL) 25 MG 24 hr tablet TAKE 1/2 TABLET DAILY  ? Multiple Vitamin (MULTIVITAMIN) tablet Take 2 tablets by mouth daily.  ? potassium chloride SA (KLOR-CON M20) 20 MEQ tablet Take 1 tablet (20 mEq total) by mouth daily.  ? predniSONE (DELTASONE) 5 MG tablet Take 5 mg by mouth daily.  ? Tiotropium Bromide Monohydrate (SPIRIVA RESPIMAT) 2.5 MCG/ACT AERS Inhale 2 puffs into the lungs daily.  ? valACYclovir (VALTREX) 1000 MG tablet Take 1,000 mg by mouth daily as needed (fever blisters).  ? warfarin (COUMADIN) 5 MG tablet Take as directed by the anticoagulation clinic.  ? ?No current facility-administered medications for this visit. (Other)  ? ?REVIEW OF SYSTEMS: ?ROS   ?Positive for: Musculoskeletal, Cardiovascular, Eyes ?Negative for: Constitutional, Gastrointestinal, Neurological, Skin, Genitourinary, HENT, Endocrine, Respiratory, Psychiatric, Allergic/Imm, Heme/Lymph ?Last edited by Theodore Demark, COA on 01/19/2022  8:09 AM.  ?  ? ?ALLERGIES ?Allergies  ?Allergen Reactions  ? Cephalexin Hives  ? Methotrexate Nausea And Vomiting  ? ?PAST MEDICAL HISTORY ?Past Medical History:  ?Diagnosis Date  ? Abscess of right breast   ? sternal osteomyeliitis, pseudomonas  ?  C. difficile diarrhea 1/12  ? CAD (coronary artery disease)   ? LHC (9/10): no angiographic CAD  ? Cardiac tamponade   ? 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   ? Cataract   ? Mixed OU  ? CHF (congestive heart failure) ([place])   ? secondary to mmoderate mitral stenosis and moderate mitral regurgitation in the setting of rheumatic vlave disease as well as severe pulmonary HTN likely due to a combination of MV disease and pulmoanary arterial HTN in the setting of collagen vascular disease. Not valvuloplasty candidate  due to MR. patient had MV replacement with mechanical MV in 8/11.  ? CHF (congestive heart failure) ([place])   ? Echo post-op showed normally functioning mechanical MV, EF 60-65% with septal bounce, small mobile structure in the mid ventricle (? loose papillary muscle), RV-RA gradient 22 mmHg. Echo (10/11): EF 60-65%, normally functioning mechanical mitral vlavue.   ? Hypertensive retinopathy   ? OU  ? Mixed connective tissue disease ([place])   ? diagnosis 1990. 8/10 ANA positive, anti-dDNA positive, anti-SCL70 negative  ? Nephrotic syndrome   ? secondary to SLE  ? Pulmonary HTN ([place])   ? porbably secondary to combination of rheumatic MV disease and pulmonary arterial HYN form vollagen vascalr disease. CTA chest (7/10) with no evidence for pulmonary emboli or chronic pulmonary emboli. PA pressure 72/28 mean 50 with PVR 5.7 WU on initial RHC, PA pressure 45/21 mean 30 PVR 2.3 WU on f/u RHC on Revatio 80 mg three times a day (1/11).  After MV surgery, Revatio stopped.  ? Pulmonary HTN ([place])   ? increased  dyspnea. Repeat RHC with mean RA 6, PA 42/20 mean 29, PVR 4.8 WU,  2.3. Revatio 20 mg  3x a day restarted. 3/11 6 min walk 493m 2/12 6 min walk 427 m.   ? Raynaud's syndrome   ? Restrictive lung disease   ? secondary to MCTD. PFTs 03/06/99 VC 47% DLCO 26%. PFTs 09/12/08 VC 43% DLCO 45%. PFTs 6/10 FVC 37%, FEV1 34%, ratio 69%, TLC 50%. using home O2 periodically  ? Rheumatic fever   ? Warfarin anticoagulation   ? for mechanical MV, goal INR 2.5-3.5  ? ?Past Surgical History:  ?Procedure Laterality Date  ? BREAST CYST ASPIRATION  03/09/2012  ? CESAREAN SECTION    ? COLONOSCOPY    ? RIGHT HEART CATH N/A 08/10/2021  ? Procedure: RIGHT HEART CATH;  Surgeon: MLarey Dresser MD;  [facility];  Service: Cardiovascular;  Laterality: N/A;  ? s/p mitral valvue replacement  05/13/2010  ? by Dr. ORoxy Mannsby minimally invasive window.   ? TUBAL LIGATION    ? ?FAMILY HISTORY ?Family History  ?Problem Relation Age of Onset  ?  Emphysema Mother   ?     smoker  ? COPD Mother   ? Heart disease Maternal Grandmother   ?     side of family not given  ? Diabetes Maternal Grandmother   ? Hypertension Father   ? Osteoarthritis Father   ? Sleep apnea Father   ? Breast cancer Maternal Aunt   ? ?SOCIAL HISTORY ?Social History  ? ?Tobacco Use  ? Smoking status: Never  ? Smokeless tobacco: Never  ?Vaping Use  ? Vaping Use: Never used  ?Substance Use Topics  ? Alcohol use: No  ? Drug use: No  ?  ? ?  ?OPHTHALMIC EXAM: ? ?Base Eye Exam   ? ? Visual Acuity (Snellen - Linear)   ? ?  Right Left  ? Dist cc 20/30 +1 20/20  ? Dist ph cc 20/25 -1   ? ? Correction: Glasses  ? ?  ?  ? ? Tonometry (Tonopen, 8:05 AM)   ? ?   Right Left  ? Pressure 18 18  ? ?  ?  ? ? Pupils   ? ?   Dark Light Shape React APD  ? Right 3 2 Round Brisk None  ? Left 3 2 Round Brisk None  ? ?  ?  ? ? Visual Fields (Counting fingers)   ? ?   Left Right  ?  Full Full  ? ?  ?  ? ? Extraocular Movement   ? ?   Right Left  ?  Full, Ortho Full, Ortho  ? ?  ?  ? ? Neuro/Psych   ? ? Oriented x3: Yes  ? Mood/Affect: Normal  ? ?  ?  ? ? Dilation   ? ? Both eyes: 1.0% Mydriacyl, 2.5% Phenylephrine @ 8:05 AM  ? ?  ?  ? ?  ? ?Slit Lamp and Fundus Exam   ? ? Slit Lamp Exam   ? ?   Right Left  ? Lids/Lashes Dermatochalasis - upper lid, mild Meibomian gland dysfunction Dermatochalasis - upper lid, mild Meibomian gland dysfunction  ? Conjunctiva/Sclera trace melanosis White and quiet  ? Cornea 1+ Punctate epithelial erosions, trace tear film debris Trace Punctate epithelial erosions, trace tear film debris  ? Anterior Chamber Deep and clear, narrow temporal angle, No cell or flare Deep and clear, narrow temporal angle, No cell or flare  ? Iris Round and dilated Round and dilated  ? Lens 2+ Nuclear sclerosis, 2-3+ Cortical cataract, 2+ Posterior subcapsular cataract 2+ Nuclear sclerosis, 2+ Cortical cataract, 1-2+ Posterior subcapsular cataract  ? Anterior Vitreous Vitreous syneresis Vitreous syneresis   ? ?  ?  ? ? Fundus Exam   ? ?   Right Left  ? Disc Pink and Sharp, +cupping Pink and Sharp, +cupping, mild PPP  ? C/D Ratio 0.7 0.8  ? Macula Flat, Good foveal reflex, RPE mottling and clumping, +small ce

## 2022-01-22 ENCOUNTER — Telehealth: Payer: Self-pay | Admitting: Internal Medicine

## 2022-01-22 ENCOUNTER — Telehealth: Payer: Self-pay | Admitting: Sports Medicine

## 2022-01-22 ENCOUNTER — Other Ambulatory Visit: Payer: Self-pay | Admitting: Sports Medicine

## 2022-01-22 DIAGNOSIS — T148XXA Other injury of unspecified body region, initial encounter: Secondary | ICD-10-CM

## 2022-01-22 DIAGNOSIS — M7918 Myalgia, other site: Secondary | ICD-10-CM

## 2022-01-22 DIAGNOSIS — M25521 Pain in right elbow: Secondary | ICD-10-CM

## 2022-01-22 NOTE — Telephone Encounter (Signed)
[facility] called stating that they received the referral for the patient to begin PT. Because it is the elbow and below, they also asked for an OT order. ? ?Can this be put in for him? ?(Fax # [phone])  ?

## 2022-01-22 NOTE — Telephone Encounter (Signed)
New referral for OT has been put in  ?

## 2022-01-29 ENCOUNTER — Ambulatory Visit
Admission: RE | Admit: 2022-01-29 | Discharge: 2022-01-29 | Disposition: A | Discharge status: Home / Self Care | Payer: BC Managed Care – PPO | Source: Ambulatory Visit | Attending: Nephrology | Admitting: Nephrology

## 2022-01-29 DIAGNOSIS — N261 Atrophy of kidney (terminal): Secondary | ICD-10-CM

## 2022-01-29 MED ORDER — GADOBENATE DIMEGLUMINE 529 MG/ML IV SOLN
17.0000 mL | Freq: Once | INTRAVENOUS | Status: AC | PRN
  Administered 2022-01-29: 17 mL via INTRAVENOUS

## 2022-02-15 NOTE — Progress Notes (Signed)
? ? Benito Mccreedy D.Merril Abbe ?[facility] ?[place] ?Phone: [phone] ?  ?Assessment and Plan:   ?  ?1. Brachioradialis muscle tenderness ?2. Right elbow pain ?-Subacute, significantly improved, subsequent visit ?- Overall significant improvement in elbow pain and range of motion with complete resolution of hematoma ?- Patient cleared to return to work without restrictions.  Work note provided ?- Start HEP for elbow strengthening ?  ?Pertinent previous records reviewed include none ?  ?Follow Up: As needed ?  ?Subjective:   ?I, [name], am serving as a Education administrator for [employer] ?  ?Chief Complaint: right bicep tear with hematoma ?  ?HPI:  ?  ?01/14/22 ?Patient is a 56 year old female complaining of right bicep pain. Patient states cant extender her arm has lots of bruising , 2 weeks ago she thinks she bumped into something and then the bruising came and the bruising became worst over the week, no numbness or tingling , has been taking tylenol, decreased ROM,  ?  ?01/19/2022 ?Patient states that the pain is subsiding and the swelling has gone down  ? ?02/16/2022 ?Patient states that she is wonderful  ?  ?Relevant Historical Information: Complicated past medical history including pulmonary hypertension, cardiomyopathies, CAD, CHF, lupus, warfarin anticoagulation ? ?Additional pertinent review of systems negative. ? ? ?Current Outpatient Medications:  ?  acetaminophen (TYLENOL) 325 MG tablet, Take 650 mg by mouth every 6 (six) hours as needed for moderate pain., Disp: , Rfl:  ?  aspirin 81 MG EC tablet, Take 81 mg by mouth daily., Disp: , Rfl:  ?  azaTHIOprine (IMURAN) 50 MG tablet, Take 50 mg by mouth daily. , Disp: , Rfl:  ?  Belimumab (BENLYSTA) 200 MG/ML SOSY, Inject 1 Dose into the skin every Monday., Disp: , Rfl:  ?  calcium carbonate (OS-CAL - DOSED IN MG OF ELEMENTAL CALCIUM) 1250 (500 Ca) MG tablet, Take 2 tablets by mouth daily., Disp: ,  Rfl:  ?  cholecalciferol (VITAMIN D3) 25 MCG (1000 UNIT) tablet, Take 1,000 Units by mouth daily., Disp: , Rfl:  ?  Cyanocobalamin (B-12) 1000 MCG TABS, Take 1,000 mcg by mouth daily., Disp: , Rfl:  ?  Folic Acid (FOLATE PO), Take 666 mcg by mouth daily., Disp: , Rfl:  ?  furosemide (LASIX) 20 MG tablet, Take 1 tablet (20 mg total) by mouth daily. Take '40mg'$  (2 tablets) by mouth alternating with '20mg'$  (1 tablet) by mouth daily., Disp: 135 tablet, Rfl: 5 ?  Magnesium 250 MG TABS, Take 250 mg by mouth daily., Disp: , Rfl:  ?  metoprolol succinate (TOPROL-XL) 25 MG 24 hr tablet, TAKE 1/2 TABLET DAILY, Disp: 45 tablet, Rfl: 3 ?  Multiple Vitamin (MULTIVITAMIN) tablet, Take 2 tablets by mouth daily., Disp: , Rfl:  ?  potassium chloride SA (KLOR-CON M20) 20 MEQ tablet, Take 1 tablet (20 mEq total) by mouth daily., Disp: 180 tablet, Rfl: 3 ?  predniSONE (DELTASONE) 5 MG tablet, Take 5 mg by mouth daily., Disp: , Rfl:  ?  Tiotropium Bromide Monohydrate (SPIRIVA RESPIMAT) 2.5 MCG/ACT AERS, Inhale 2 puffs into the lungs daily., Disp: 4 g, Rfl: 5 ?  valACYclovir (VALTREX) 1000 MG tablet, Take 1,000 mg by mouth daily as needed (fever blisters)., Disp: , Rfl:  ?  warfarin (COUMADIN) 5 MG tablet, Take as directed by the anticoagulation clinic., Disp: 135 tablet, Rfl: 3  ? ?Objective:   ?  ?Vitals:  ? 02/16/22 0904  ?BP: 120/80  ?  Weight: 185 lb (83.9 kg)  ?Height: '5\' 5"'$  (1.651 m)  ?  ?  ?Body mass index is 30.79 kg/m?.  ?  ?Physical Exam:   ? ?General: Appears well, no acute distress, nontoxic and pleasant ?Neck: FROM, no pain ?Neuro: sensation is intact distally with no deficits, strenghth is 5/5 in elbow flexors/extenders/supinator/pronators and wrist flexors/extensors ?Psych: no evidence of anxiety or depression ? ?Right ELBOW: no deformity, swelling or muscle wasting ?Normal Carrying angle ?ROM:0-140, supination and pronation 90 ?NTTP over triceps, ticeps tendon, olecronon, lat epicondyle, medial epicondyle, antecubital fossa,  biceps tendon, supinator, pronator ?Negative tinnels over cubital tunnel ?No pain with resisted wrist and middle digit extension ?No pain with resisted wrist flexion ?No pain with resisted supination ?No pain with resisted pronation ?Negative valgus stress ?Negative varus stress ?  ? ? ?Electronically signed by:  ?Benito Mccreedy D.Merril Abbe ?[facility] ?9:12 AM 02/16/22 ?

## 2022-02-16 ENCOUNTER — Ambulatory Visit: Payer: BC Managed Care – PPO | Admitting: Sports Medicine

## 2022-02-16 VITALS — BP 120/80 | Ht 65.0 in | Wt 185.0 lb

## 2022-02-16 DIAGNOSIS — M7918 Myalgia, other site: Secondary | ICD-10-CM | POA: Diagnosis not present

## 2022-02-16 DIAGNOSIS — M25521 Pain in right elbow: Secondary | ICD-10-CM | POA: Diagnosis not present

## 2022-02-16 NOTE — Patient Instructions (Addendum)
Good to see you  ?Work not provided  ?Elbow HEP  ?As needed follow up  ?

## 2022-02-23 ENCOUNTER — Telehealth: Payer: Self-pay | Admitting: Family Medicine

## 2022-02-23 NOTE — Telephone Encounter (Signed)
LVM and sent mychart msg informing pt of r/s needed for 7/3- NP out. ?

## 2022-03-18 ENCOUNTER — Ambulatory Visit (HOSPITAL_COMMUNITY)
Admission: RE | Admit: 2022-03-18 | Discharge: 2022-03-18 | Disposition: A | Discharge status: Home / Self Care | Payer: BC Managed Care – PPO | Source: Ambulatory Visit | Attending: Cardiology | Admitting: Cardiology

## 2022-03-18 ENCOUNTER — Encounter (HOSPITAL_COMMUNITY): Payer: Self-pay | Admitting: Cardiology

## 2022-03-18 VITALS — BP 100/68 | HR 92 | Wt 188.6 lb

## 2022-03-18 DIAGNOSIS — I052 Rheumatic mitral stenosis with insufficiency: Secondary | ICD-10-CM | POA: Insufficient documentation

## 2022-03-18 DIAGNOSIS — I272 Pulmonary hypertension, unspecified: Secondary | ICD-10-CM | POA: Diagnosis not present

## 2022-03-18 DIAGNOSIS — Z79899 Other long term (current) drug therapy: Secondary | ICD-10-CM | POA: Insufficient documentation

## 2022-03-18 DIAGNOSIS — Z952 Presence of prosthetic heart valve: Secondary | ICD-10-CM | POA: Insufficient documentation

## 2022-03-18 DIAGNOSIS — M351 Other overlap syndromes: Secondary | ICD-10-CM | POA: Diagnosis not present

## 2022-03-18 DIAGNOSIS — I5022 Chronic systolic (congestive) heart failure: Secondary | ICD-10-CM | POA: Diagnosis not present

## 2022-03-18 DIAGNOSIS — Z79624 Long term (current) use of inhibitors of nucleotide synthesis: Secondary | ICD-10-CM | POA: Diagnosis not present

## 2022-03-18 DIAGNOSIS — I7300 Raynaud's syndrome without gangrene: Secondary | ICD-10-CM | POA: Diagnosis present

## 2022-03-18 DIAGNOSIS — G4733 Obstructive sleep apnea (adult) (pediatric): Secondary | ICD-10-CM | POA: Diagnosis not present

## 2022-03-18 DIAGNOSIS — J439 Emphysema, unspecified: Secondary | ICD-10-CM | POA: Diagnosis not present

## 2022-03-18 DIAGNOSIS — I059 Rheumatic mitral valve disease, unspecified: Secondary | ICD-10-CM

## 2022-03-18 DIAGNOSIS — Z7952 Long term (current) use of systemic steroids: Secondary | ICD-10-CM | POA: Insufficient documentation

## 2022-03-18 DIAGNOSIS — N049 Nephrotic syndrome with unspecified morphologic changes: Secondary | ICD-10-CM | POA: Insufficient documentation

## 2022-03-18 LAB — CBC
HCT: 37.5 % (ref 36.0–46.0)
Hemoglobin: 12.4 g/dL (ref 12.0–15.0)
MCH: 33.7 pg (ref 26.0–34.0)
MCHC: 33.1 g/dL (ref 30.0–36.0)
MCV: 101.9 fL — ABNORMAL HIGH (ref 80.0–100.0)
Platelets: 215 10*3/uL (ref 150–400)
RBC: 3.68 MIL/uL — ABNORMAL LOW (ref 3.87–5.11)
RDW: 14.5 % (ref 11.5–15.5)
WBC: 3.5 10*3/uL — ABNORMAL LOW (ref 4.0–10.5)
nRBC: 0 % (ref 0.0–0.2)

## 2022-03-18 LAB — BASIC METABOLIC PANEL
Anion gap: 3 — ABNORMAL LOW (ref 5–15)
BUN: 7 mg/dL (ref 6–20)
CO2: 27 mmol/L (ref 22–32)
Calcium: 8.3 mg/dL — ABNORMAL LOW (ref 8.9–10.3)
Chloride: 110 mmol/L (ref 98–111)
Creatinine, Ser: 0.62 mg/dL (ref 0.44–1.00)
GFR, Estimated: >60 mL/min (ref >60)
Glucose, Bld: 89 mg/dL (ref 70–99)
Potassium: 3.6 mmol/L (ref 3.5–5.1)
Sodium: 140 mmol/L (ref 135–145)

## 2022-03-18 NOTE — Patient Instructions (Signed)
There has been no changes to your medications.  Labs done today, your results will be available in MyChart, we will contact you for abnormal readings.  Your physician has requested that you have an echocardiogram. Echocardiography is a painless test that uses sound waves to create images of your heart. It provides your doctor with information about the size and shape of your heart and how well your heart's chambers and valves are working. This procedure takes approximately one hour. There are no restrictions for this procedure.  Your physician recommends that you schedule a follow-up appointment in: 6 months with echocardiogram (December 2023) ** please call the office September to arrange your follow up appointment **  If you have any questions or concerns before your next appointment please send [place] a message through [place] or call our office at [phone].    TO LEAVE A MESSAGE FOR THE NURSE SELECT OPTION 2, PLEASE LEAVE A MESSAGE INCLUDING: YOUR NAME DATE OF BIRTH CALL BACK NUMBER REASON FOR CALL**this is important as we prioritize the call backs  YOU WILL RECEIVE A CALL BACK THE SAME DAY AS LONG AS YOU CALL BEFORE 4:00 PM  At the [facility], you and your health needs are our priority. As part of our continuing mission to provide you with exceptional heart care, we have created designated Provider Care Teams. These Care Teams include your primary Cardiologist (physician) and Advanced Practice Providers (APPs- Physician Assistants and Nurse Practitioners) who all work together to provide you with the care you need, when you need it.   You may see any of the following providers on your designated Care Team at your next follow up: Dr Glori Bickers Dr Haynes Kerns, NP Lyda Jester, [place] [facility], [place] Audry Riles, PharmD   Please be sure to bring in all your medications bottles to every appointment.

## 2022-03-18 NOTE — Progress Notes (Signed)
Patient ID: Katie [name], female   DOB: [birth year], 56 y.o.   MRN: [id number] PCP: Dr. Shelia Media Cardiology: Dr . Aundra Dubin  56 y.o. 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    Echo in 1/19 showed EF 60-65%, mechanical mitral valve with mean gradient 5 mmHg, and PA systolic pressure 33 mmHg with normal RV.  Echo in 8/20 showed EF 50-55%, normal RV, mechanical MV with mean gradient 6 mmHg, unable to estimate PA systolic pressure, normal RV.  Echo in 11/21 showed EF 60-65%, RV probably normal, PASP 25 mmHg, mechanical MV with mean gradient 5 mmHg.   RHC was done in 10/22 after stopping sildenafil. This showed normal filling pressures and normal PA pressure.   Patient is doing well.  Weight down 2 lbs.  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  She denies exertional dyspnea or chest pain.  No orthopnea/PND.  No palpitations or lightheadedness.   ECG (personally reviewed): NSR, RBBB  Labs (8/11): HCT 27 => 30, creatinine 0.98, INR 4 Labs (9/11): K 4.6, creatinine 0.9, HCT 32.7, BNP 214=>146, LFTs normal, TSH normal Labs (1/12): HCT 37.9, K 3.5, creatinine 0.9 Labs (4/12): LDL 108, HDL 43, TSH normal, K 4.1, creatinine 0.8, HCT 37.9 Labs (1/13): K 3.8, creatinine 0.8 Labs (11/14): K 3.6, creatinine 0.71, LDL 94, HCT 38 Labs (12/15): Hgb 12.5 Labs (2/16): K 3.9, creatinine 0.84 Labs (8/16): K 3.9, creatinine 0.87, HCT 38.6 Labs (4/17): K 3.6, creatinine 0.88, LDL 92, HDL 38, hgb 11.7 Labs (10/17): K 3.6, creatinine 0.81, hgb 12.1 Labs (1/20): K 4.3, creatinine 0.79, LDL 107, hgb 12 Labs (6/21): creatinine 0.7 Labs (7/22): BNP 24, K 4.3, creatinine 0.78 Labs (9/22): BNP 103 Labs  (10/22): K 4.4, creatinine 0.75, hgb 13.3 Labs (3/23): LDL 114, K 4, creatinine 0.7  Allergies (verified):  1)  ! Methotrexate  Past Medical History: 1. Mixed connective tissue disease: diagnosis 1990. 8/10 ANA positive, anti-dsDNA positive, anti-SCL70 negative 2. 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     - PFT's 03/06/99  VC 47%  DLC0 26%    - PFT's 09/12/08  VC 43%  DLC0 45%    - PFT's 6/10 FVC 37%, FEV1 34%, ratio 69%, TLC 50%    - PFTs 11/15 FVC 39%, FEV1 35%, ratio 89%, TLC 63%, DLCO 48%    - CT chest in 2/22 showed emphysema but no ILD.  4. Nephrotic syndrome: secondary to MCTD 5. CHF: Secondary to moderate mitral stenosis and moderate mitral regurgitation in the setting of rheumatic mitral valve disease as well as severe pulmonary HTN likely due to a combination of MV disease and pulmonary arterial HTN in the setting of collagen vascular disease.   Not valvuloplasty candidate due to MR.  Patient had MV replacement with mechanical MV in 8/11.  Echo (8/11) post-op showed normally functioning mechanical MV, EF 60-65% with septal bounce, small mobile structure in the mid ventricle (? loose papillary muscle), RV-RA gradient 22 mmHg.  Echo (10/11): EF 60-65%, normally functioning mechanical mitral valve.  Echo (7/13): EF 55-60%, mechanical mitral valve with mean gradient 4 mmHg, PA systolic pressure 29 mmHg. Echo (5/15) with EF 55-60%, mechanical mitral valve functioning normally, normal RV size and systolic  function, PA systolic pressure 32 mmHg.  - Echo in 5/17 showed EF 60-65%, normal mechanical mitral valve, and PA systolic pressure 33 mmHg with normal RV.  - Echo (1/19): EF 60-65%, mild LVH, normal RV size and systolic function, mechanical mitral valve with mean gradient 6 mmHg, PASP 33 mmHg.  - Echo (8/20): EF 50-55%, normal RV, mechanical MV with mean gradient 6 mmHg, unable to estimate PA systolic pressure, normal RV.  No change from  prior.  - Echo (11/21): EF 60-65%, RV probably normal, PASP 25 mmHg, mechanical MV with mean gradient 5 mmHg. 6.  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  CTA chest (7/10) with no evidence for pulmonary emboli or chronic pulmonary emboli.  PA pressure 72/28 mean 50 with PVR 5.7 WU on initial RHC, PA pressure 45/21 mean 30 PVR 2.3 WU on f/u RHC on Revatio 80 mg three times a day (1/11).  After MV surgery, Revatio stopped.  Increased dyspnea.  Repeat RHC with mean RA 6, PA 42/20 (mean 29), PVR 4.8 WU, CI 2.3.  Revatio 20 mg three times a day restarted.  Echo (7/65) with PA systolic pressure 29 mmHg. Echo (4/65) with PA systolic pressure 32 mmHg.  - 3/11 6 min walk 427 m - 2/12 6 min walk 427 m - 1/13 6 min walk 411 m - 4/15 6 min walk 448 m - 2/16 6 min walk 421 m - 8/16 6 min walk 354 m - 9/17 6 min walk 436 m - 1/19 6 min walk 366 m - 10/19 6 min walk 402 m - 8/20 6 min walk 137 m (but walked slowly and stopped early due to knee pain).  - RHC (10/22): mean RA 1, PA 28/8 mean 18, mean PCWP 7, CI 2.83, PVR 2 WU.  7.  Rheumatic fever 8.  LHC (9/10): no angiographic CAD.  9.  Warfarin anticoagulation for mechanical MV, goal INR 2.5-3.5 10.  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  11. 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 C difficile diarrhea 1/12 13. GERD 14. Holter (1/19): No significant arrhythmias.  15. COPD: CT chest in 2/22 showed emphysema but no ILD.  Suspect due to passive smoking.   Family History: Emphysema mother, smoker.  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   ROS: All systems reviewed and negative except as noted in HPI.   Current Outpatient Medications  Medication Sig Dispense Refill   acetaminophen (TYLENOL) 325 MG tablet Take 650 mg by  mouth every 6 (six) hours as needed for moderate pain.     aspirin 81 MG EC tablet Take 81 mg by mouth daily.     azaTHIOprine (IMURAN) 50 MG tablet Take 50 mg by mouth daily.      Belimumab (BENLYSTA) 200 MG/ML SOSY Inject 1 Dose into the skin every Monday.     calcium carbonate (OS-CAL - DOSED IN MG OF ELEMENTAL CALCIUM) 1250 (500 Ca) MG tablet Take 2 tablets by mouth daily.     cholecalciferol (VITAMIN D3) 25 MCG (1000 UNIT) tablet Take 1,000 Units by mouth daily.     Cyanocobalamin (B-12) 1000 MCG TABS Take 1,000 mcg by mouth daily.     Folic Acid (FOLATE PO) Take 666 mcg by mouth daily.     furosemide (LASIX) 20 MG tablet Take 1 tablet (20 mg total) by mouth daily.  Take '40mg'$  (2 tablets) by mouth alternating with '20mg'$  (1 tablet) by mouth daily. 135 tablet 5   Magnesium 250 MG TABS Take 250 mg by mouth daily.     metoprolol succinate (TOPROL-XL) 25 MG 24 hr tablet TAKE 1/2 TABLET DAILY 45 tablet 3   Multiple Vitamin (MULTIVITAMIN) tablet Take 2 tablets by mouth daily.     potassium chloride SA (KLOR-CON M20) 20 MEQ tablet Take 1 tablet (20 mEq total) by mouth daily. 180 tablet 3   predniSONE (DELTASONE) 5 MG tablet Take 5 mg by mouth daily.     Tiotropium Bromide Monohydrate (SPIRIVA RESPIMAT) 2.5 MCG/ACT AERS Inhale 2 puffs into the lungs daily. 4 g 5   valACYclovir (VALTREX) 1000 MG tablet Take 1,000 mg by mouth daily as needed (fever blisters).     warfarin (COUMADIN) 5 MG tablet Take as directed by the anticoagulation clinic. 135 tablet 3   No current facility-administered medications for this encounter.    BP 100/68   Pulse 92   Wt 85.5 kg (188 lb 9.6 oz)   SpO2 96%   BMI 31.38 kg/m  General: NAD Neck: No JVD, no thyromegaly or thyroid nodule.  Lungs: Clear to auscultation bilaterally with normal respiratory effort. CV: Nondisplaced PMI.  Heart regular S1/S2 with mechanical S1, no S3/S4, no murmur.  No peripheral edema.  No carotid bruit.  Normal pedal pulses.  Abdomen: Soft,  nontender, no hepatosplenomegaly, no distention.  Skin: Intact without lesions or rashes.  Neurologic: Alert and oriented x 3.  Psych: Normal affect. Extremities: No clubbing or cyanosis.  HEENT: Normal.   Assessment/Plan:  1. Status post mechanical MVR:  Mitral valve replacement, stable from valve standpoint. Echo in 11/21 showed mean gradient 5 mmHg without significant MR.  She is on coumadin goal INR 2.5 - 3.5 and ASA 81 mg daily.   - CBC today.  - Echo to followup MV at next appt.  2.  Pulmonary HTN:  Pulmonary HTN is likely a mixed picture, from mitral valve disease as well as mixed connective tissue disease.  She had mild residual pulmonary HTN with moderately increased PVR even after mitral valve surgery on last RHC though this was improved compared to initial RHC.  The residual pulmonary HTN may be due to pulmonary vascular remodeling with chronic left atrial pressure elevation in the setting of mitral valve disease. Revatio was restarted at 20 mg tid.  Echo in 8/20 showed RV normal but unable to estimate PA systolic pressure.  Echo in 11/21 showed normal RV with PASP 25 mm Hg.  Symptomatically NYHA class I.  She is now off sildenafil.  RHC in 10/22 showed normal filling pressures and normal PA pressure (off sildenafil).  - She can stay off sildenafil.  - Continue lasix 20 mg daily.  BMET today.  - Continue CPAP for OSA.  - Echo to reassess PA pressure at next appt.  3. Mixed connective tissue disorder: Patient has history of pulmonary fibrosis, but most recent CT in 2/22 showed emphysema and no significant fibrosis. Followed by rheumatology at [facility].  - On prednisone and azathioprine.  4. COPD: Emphysema on 2/22 CT chest.  Thought to be due to passive smoking.  - She will continue Spiriva, has had symptomatic improvement.  5. OSA: Continue CPAP.   Followup in 6 months with echo.   Loralie Champagne 03/18/2022

## 2022-04-12 ENCOUNTER — Ambulatory Visit: Payer: BC Managed Care – PPO | Admitting: Family Medicine

## 2022-04-15 ENCOUNTER — Ambulatory Visit: Payer: BC Managed Care – PPO | Admitting: Internal Medicine

## 2022-04-16 ENCOUNTER — Encounter: Payer: Self-pay | Admitting: Internal Medicine

## 2022-04-16 ENCOUNTER — Ambulatory Visit: Payer: BC Managed Care – PPO | Admitting: Internal Medicine

## 2022-04-16 ENCOUNTER — Ambulatory Visit (INDEPENDENT_AMBULATORY_CARE_PROVIDER_SITE_OTHER): Payer: BC Managed Care – PPO | Admitting: Internal Medicine

## 2022-04-16 ENCOUNTER — Telehealth: Payer: Self-pay | Admitting: Internal Medicine

## 2022-04-16 VITALS — BP 114/72 | HR 99 | Temp 98.1°F | Ht 64.0 in | Wt 187.8 lb

## 2022-04-16 DIAGNOSIS — Z79899 Other long term (current) drug therapy: Secondary | ICD-10-CM

## 2022-04-16 DIAGNOSIS — J439 Emphysema, unspecified: Secondary | ICD-10-CM

## 2022-04-16 DIAGNOSIS — R0609 Other forms of dyspnea: Secondary | ICD-10-CM

## 2022-04-16 DIAGNOSIS — Z8669 Personal history of other diseases of the nervous system and sense organs: Secondary | ICD-10-CM | POA: Diagnosis not present

## 2022-04-16 DIAGNOSIS — R942 Abnormal results of pulmonary function studies: Secondary | ICD-10-CM

## 2022-04-16 DIAGNOSIS — M329 Systemic lupus erythematosus, unspecified: Secondary | ICD-10-CM

## 2022-04-16 DIAGNOSIS — Z7185 Encounter for immunization safety counseling: Secondary | ICD-10-CM

## 2022-04-16 DIAGNOSIS — D84821 Immunodeficiency due to drugs: Secondary | ICD-10-CM

## 2022-04-16 LAB — PULMONARY FUNCTION TEST
DL/VA % pred: 107 %
DL/VA: 4.57 ml/min/mmHg/L
DLCO cor % pred: 53 %
DLCO cor: 11.05 ml/min/mmHg
DLCO unc % pred: 51 %
DLCO unc: 10.69 ml/min/mmHg
FEF 25-75 Pre: 0.51 L/sec
FEF2575-%Pred-Pre: 19 %
FEV1-%Pred-Pre: 31 %
FEV1-Pre: 0.84 L
FEV1FVC-%Pred-Pre: 87 %
FEV6-%Pred-Pre: 36 %
FEV6-Pre: 1.22 L
FEV6FVC-%Pred-Pre: 103 %
FVC-%Pred-Pre: 35 %
FVC-Pre: 1.22 L
Pre FEV1/FVC ratio: 69 %
Pre FEV6/FVC Ratio: 100 %

## 2022-04-16 NOTE — Telephone Encounter (Signed)
Dalton  You know Wynema Birch quite well.  She has mechanical mitral valve but also pulmonary hypertension.  She told me she has treopnea when she tries to sleep on right side. ? Due to valve or pulm htn or PFO  Last echo 2021  Wanted to  see what yourthoughts were  Thanks    SIGNATURE    Dr. Brand Males, M.D., F.C.C.P,  Pulmonary and Critical Care Medicine Staff Physician, [place] Director - Interstitial Lung Disease  Program  Medical Director - [facility] [facility] [place], [place], [address]  NPI Number:  NPI #[id number] [facility] Number: [id number]  Pager: [phone], If no answer  -[place] or [place] [phone] Telephone (clinical office): [phone] Telephone (research): [phone]  12:49 PM 04/16/2022

## 2022-04-16 NOTE — Progress Notes (Signed)
Spirometry and dlco done today. 

## 2022-04-16 NOTE — Progress Notes (Signed)
OV 11/19/2020  Subjective:  Patient ID: Katie [name], female , DOB: Dec 26, 1965 , age 56 y.o. , MRN: [id number] , ADDRESS: [address] [place] [address] PCP Deland Pretty, MD Patient Care Team: Deland Pretty, MD as PCP - General (Internal Medicine)  This Provider for this visit: Treatment Team:  Attending Provider: Brand Males, MD    11/19/2020 -   Chief Complaint  Patient presents with   Consult    ILD consult. Chronic cough, SOB with exertion. Some wheezing     HPI Katie [name] 56 y.o. -presents for new consultation.  She is to see Dr. Christinia Gully but that was several years ago and therefore is a new consultation.  She has been referred by Dr. Loralie Champagne based on chart review and her history.  She has mitral valve prolapse and she says on a 6-minute walk test she desaturated.  And given her lupus she has been referred here.  She gives a history of SLE since early 70s.  She is currently on immunosuppressed with prednisone, Imuran and also Benlysta infusions.  She says lupus initially involved her kidneys but does not at this point in time.  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  Also involves the heart and is the basis of mitral valve repair.  She says she also has Raynaud's.  Overall she feels lupus is well controlled.  She works as a Teacher, early years/pre  In terms of pulmonary in 2012 she had CT scan of the chest that reported emphysema but no obvious description of fibrosis but chest x-rays in 2015 reported fibrosis she does not have any CT scan of the chest at that time.  Her last PFTs were in 2015 that is reviewed below and visualized the image myself.  It shows restriction with a low DLCO consistent with someone with fibrosis.  In terms of her symptoms she has stable dyspnea on exertion.  She works as a Teacher, early years/pre.  She has chronic cough that for which she believes she will benefit from any symptom  relief.  She has had a Covid vaccine but the response to the vaccine is not known.  There is no Covid IgG check so far.  She is open to using oxygen but does not want to use it at work.  She denies any mold exposure or bird exposure or feather dust exposure or having down pillow   Echocardiogram #2021  -Normal functioning mitral valve that was repaired.  Normal ejection fraction normal right ventricular function   Sleep study 2017   -Apnea-hypopnea index 2.7  Results for PRISCA, GEARING (MRN [id number]) as of 11/19/2020 10:00  Ref. Range 06/10/2009 20:08  Anti Nuclear Antibody (ANA) Latest Ref Range: NEGATIVE  POS (A)  ANA Pattern 1 Unknown SPECKLED  ANA Titer 1 Latest Ref Range: <1:40  1:320 (H)  ds DNA Ab Latest Ref Range: <5  69 (H)  Scleroderma (Scl-70) (ENA) Antibody, IgG Latest Ref Range: <1.0  0.5     Walk test   Simple office walk 185 feet x  3 laps goal with forehead probe 11/19/2020   O2 used ra  Number laps completed 3  Comments about pace x  Resting Pulse Ox/HR 100% and 87/min  Final Pulse Ox/HR 88% and 110/min  Desaturated </= 88% yes  Desaturated <= 3% points yes  Got Tachycardic >/= 90/min yes  Symptoms at end of test x  Miscellaneous comments x   '  CT Chest data    PFT   Results for OMESHA, BOWERMAN (MRN [id number]) as of 11/19/2020 10:00  Ref. Range 08/19/2020 10:33  Creatinine Latest Ref Range: 0.44 - 1.00 mg/dL 0.69  Results for NORBERTA, STOBAUGH (MRN [id number]) as of 11/19/2020 10:00  Ref. Range 08/19/2020 10:33  Hemoglobin Latest Ref Range: 12.0 - 15.0 g/dL 13.1   ECHO 09/02/2020  IMPRESSIONS     1. Left ventricular ejection fraction, by estimation, is 60 to 65%. The  left ventricle has normal function. The left ventricle has no regional  wall motion abnormalities. Left ventricular diastolic function could not  be evaluated.   2. Right ventricular systolic function was not well visualized. The right  ventricular size is normal. There is normal  pulmonary artery systolic  pressure. The estimated right ventricular systolic pressure is 02.7 mmHg.   3. Left atrial size was mildly dilated.   4. The mitral valve has been repaired/replaced. No evidence of mitral  valve regurgitation. No evidence of mitral stenosis. The mean mitral valve  gradient is 5.2 mmHg with average heart rate of 89 bpm. There is a  mechanical valve present in the mitral  position. Procedure Date: 05/19/2010. Echo findings are consistent with  normal structure and function of the mitral valve prosthesis.   5. The aortic valve is normal in structure. Aortic valve regurgitation is  not visualized. No aortic stenosis is present.   6. The inferior vena cava is normal in size with greater than 50%  respiratory variability, suggesting right atrial pressure of 3 mmHg.   Comparison(s): No significant change from prior study. Prior images  reviewed side by side.   FINDINGS   Left Ventricle: Left ventricular ejection fraction, by estimation, is 60  to 65%. The left ventricle has normal function. The left ventricle has no  regional wall motion abnormalities. The left ventricular internal cavity  size was normal in size. There is   no left ventricular hypertrophy. Abnormal (paradoxical) septal motion  consistent with post-operative status. Left ventricular diastolic function  could not be evaluated due to mitral valve replacement. Left ventricular  diastolic function could not be  evaluated.   Right Ventricle: The right ventricular size is normal. No increase in  right ventricular wall thickness. Right ventricular systolic function was  not well visualized. There is normal pulmonary artery systolic pressure.  The tricuspid regurgitant velocity is   2.34 m/s, and with an assumed right atrial pressure of 3 mmHg, the  estimated right ventricular systolic pressure is 25.3 mmHg.   Left Atrium: Left atrial size was mildly dilated.   Right Atrium: Right atrial size was  normal in size.   Pericardium: There is no evidence of pericardial effusion.   Mitral Valve: The mitral valve has been repaired/replaced. No evidence of  mitral valve regurgitation. There is a mechanical valve present in the  mitral position. Procedure Date: 05/19/2010. Echo findings are consistent  with normal structure and function  of the mitral valve prosthesis. No evidence of mitral valve stenosis. The  mean mitral valve gradient is 5.2 mmHg with average heart rate of 89 bpm.   Tricuspid Valve: The tricuspid valve is normal in structure. Tricuspid  valve regurgitation is mild . No evidence of tricuspid stenosis.   Aortic Valve: The aortic valve is normal in structure. Aortic valve  regurgitation is not visualized. No aortic stenosis is present.   Pulmonic Valve: The pulmonic valve was normal in structure. Pulmonic valve  regurgitation is not visualized. No  evidence of pulmonic stenosis.   Aorta: The aortic root is normal in size and structure.   Venous: The inferior vena cava is normal in size with greater than 50%  respiratory variability, suggesting right atrial pressure of 3 mmHg.   IAS/Shunts: No atrial level shunt detected by color flow      OV 02/05/2021  Subjective:  Patient ID: Katie [name], female , DOB: Nov 24, 1965 , age 59 y.o. , MRN: [id number] , ADDRESS: [address] [place] [address] PCP Deland Pretty, MD Patient Care Team: Deland Pretty, MD as PCP - General (Internal Medicine)  This Provider for this visit: Treatment Team:  Attending Provider: Brand Males, MD    02/05/2021 -   Chief Complaint  Patient presents with   Follow-up    Doing ok, breathing is the same     HPI Katie [name] 56 y.o. - returns for follow-up.  She has lupus immunosuppression restricted PFTs exertional hypoxemia and exertional dyspnea.  High concern for interstitial lung disease but she had high-resolution CT chest the chest shows emphysema.  She had  pulmonary function test that still shows restriction and low DLCO but it is stable compared to 02-Jan-2014.  I interviewed her.  Her mom used to be a heavy smoker and she died in 01-02-05.  During this time patient was heavily exposed to cigarette smoke at home.  The current CT is similar to 2011/01/03 many years ago which showed emphysema.  Patient is not using exertional oxygen because of poor customer service from [employer].  She does not want to be with that agency again.  She is willing to try other treatment for emphysema.  I had a to the ILD exposure questionnaire.  It appears that she has shortness of breath for 13 years gradually getting worse.  Symptom score is below.  She also has a chronic cough that is moderate in intensity and is been going on since 01-03-2008.  She does have lupus she does have Raynaud's.  She has fatigue she has arthralgia she is really not she does have some snoring.  Family history is positive for emphysema in her mother.  Organic and inorganic antigen exposure history is negative    CT Chest data 12/01/20   IMPRESSION: 1. Emphysema (ICD10-J43.9) with scattered pulmonary parenchymal scarring. No definitive evidence of superimposed interstitial lung disease. 2. Aortic atherosclerosis (ICD10-I70.0). Coronary artery calcification.     Electronically Signed   By: Lorin Picket M.D.   On: 12/01/2020 13:59     OV 06/11/2021  Subjective:  Patient ID: Katie [name], female , DOB: 1966/03/09 , age 72 y.o. , MRN: [id number] , ADDRESS: [address] [place] [address] PCP Deland Pretty, MD Patient Care Team: Deland Pretty, MD as PCP - General (Internal Medicine)  This Provider for this visit: Treatment Team:  Attending Provider: Brand Males, MD    06/11/2021 -   Chief Complaint  Patient presents with   Follow-up    Pt states she has been doing okay since last visit. States that she still is becoming SOB with exertion.   HPI ARIAH MOWER 56 y.o. -presents  for follow-up.  She was originally referred because of concern of lupus related interstitial lung disease because of the presence of lupus, immunosuppression and restrictive PFTs.  However admitted high-resolution CT chest earlier this year there is only emphysema.  She gave a strong history of passive smoking.  So when I saw her in April 2022 started on Spiriva.  She is here for follow-up.  She tells me that the fatigue and cough is significantly improved with Spiriva but shortness of breath persists.  She says overall shortness of breath is stable but looking at the score shortness of breath might be worse.  Review of the chart indicate that in the interim Dr. Aundra Dubin is the main increase Lasix for her.  There is no active smoking or passive smoking currently.  She has never been to pulmonary rehabilitation.  She is using CPAP since spring 2022.  She is also using oxygen at night.       OV 04/16/2022  Subjective:  Patient ID: Katie [name], female , DOB: 12-20-1965 , age 64 y.o. , MRN: [id number] , ADDRESS: [address] [place] [address] PCP Deland Pretty, MD Patient Care Team: Deland Pretty, MD as PCP - General (Internal Medicine)  This Provider for this visit: Treatment Team:  Attending Provider: Brand Males, MD    04/16/2022 -   Chief Complaint  Patient presents with   Follow-up    PFT performed today.  Pt states she has been doing okay since last visit and denies any complaints.    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 Brett Fairy on CPAP at night since spring 2022   -Original sleep study 2017 -May 2022 sleep studyL: 02-25-2021 and showed a sleep time of 7 hours with 60% REM sleep, her AHI was astronomically high at 86 apneas and  hypopneas per hour.  The maximum oxygen saturation was 98% but the nadir was only 78% she had frequent short oxygen saturations but the total desaturation time was only 2.6 minutes.  She was prescribed a CPAP with 6 to 18 cm au  -Last evaluation August 2022 by Dr. 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 Loralie Champagne.  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 Loralie Champagne  -CT angiogram chest July 2010 with no evidence of PE  -Status post mitral valve replacement in 2011  -On mechanical anticoagulation  -Unclear date of most recent cardiac cath but apparently improved since mitral valve replacement per Dr. Aundra Dubin notes November 2021  -Most recent echo November 2703 with RV systolic pressure 25 and normal EF left ventricle.  Unable to estimate diastolic function  -Restricted PFTs on evaluation but CT scan of the chest 2022 with emphysema and strong history of passive smoking   -No ILD on CT chest`  -Started Spiriva April 2022  HPI SHELIA KINGSBERRY 56 y.o. -presents for routine follow-up.  Last seen in September 2022.  She says she cannot do pulmonary rehabilitation because of her job and logistics.  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  Overall she says she is doing fairly well.  Her main issues are that when she lies down on the right side she gets short of breath.  She says this been going on since the diagnose of pulmonary  hypertension.  She also explains that when she tries to crunch her abdomen and bent down to pick up something she gets more short of breath  otherwise routine activities are fine.  Explained this is because of diaphragmatic compression.  There is no longer any passive smoking exposure.  In the past she had significant passive smoking exposure.  She had lung function today that shows severe obstruction with an FEV1 of 0.8 L / 31% and a ratio of 69.  Diffusion is also reduced at 10.7/51%.  Flow volume loop is consistent with obstruction.  Spiriva is working well for her.       SYMPTOM SCALE - emphysema, luspus 02/05/2021  06/11/2021 spiriva  O2 use ra ra  Shortness of Breath 0 -> 5 scale with 5 being worst (score 6 If unable to do)   At rest 0 0  Simple tasks - showers, clothes change, eating, shaving 0 1  Household (dishes, doing bed, laundry) 1 3  Shopping 3 4  Walking level at own pace 4 5  Walking up Stairs 5 5  Total (30-36) Dyspnea Score 13 18  How bad is your cough? 5 2  How bad is your fatigue 5 3  How bad is nausea 0 0  How bad is vomiting?  0 0  How bad is diarrhea? 0 0  How bad is anxiety? 3 2  How bad is depression 2 2      Walk test  Simple office walk 185 feet x  3 laps goal with forehead probe 11/19/2020  06/11/2021   O2 used ra ra  Number laps completed 3 3  Comments about pace x avg  Resting Pulse Ox/HR 100% and 87/min 100% and 102  Final Pulse Ox/HR 88% and 110/min 95% and 130  Desaturated </= 88% yes no  Desaturated <= 3% points yes Yes 5 ponts  Got Tachycardic >/= 90/min yes Yes tachy even at rest  Symptoms at end of test x Moderate dyspnea  Miscellaneous comments x     PFT     Latest Ref Rng & Units 04/16/2022    9:00 AM 12/05/2020    8:58 AM 08/16/2014    9:02 AM 02/08/2014    9:57 AM  PFT Results  FVC-Pre L 1.22  P 1.17  1.16  1.14   FVC-Predicted Pre % 35  P 41  39  38   FVC-Post L  1.27  1.25  1.16   FVC-Predicted Post %  44  42  39   Pre FEV1/FVC % % 69  P 72  73  71   Post FEV1/FCV % %  69  76  78   FEV1-Pre L 0.84  P 0.84  0.85  0.82   FEV1-Predicted Pre % 31  P 37  35  34    FEV1-Post L  0.88  0.95  0.90   DLCO uncorrected ml/min/mmHg 10.69  P 9.57  11.83  11.54   DLCO UNC% % 51  P 45  48  47   DLCO corrected ml/min/mmHg 11.05  P     DLCO COR %Predicted % 53  P     DLVA Predicted % 107  P 102  104  99   TLC L  3.28  3.23  3.04   TLC % Predicted %  64  63  60   RV % Predicted %  98  112  108     P Preliminary result  has a past medical history of Abscess of right breast, C. difficile diarrhea (1/12), CAD (coronary artery disease), Cardiac tamponade, Cataract, CHF (congestive heart failure) ([place]), CHF (congestive heart failure) ([place]), Hypertensive retinopathy, Mixed connective tissue disease ([place]), Nephrotic syndrome, Pulmonary HTN ([place]), Pulmonary HTN ([place]), Raynaud's syndrome, Restrictive lung disease, Rheumatic fever, and Warfarin anticoagulation.   reports that she has never smoked. She has never used smokeless tobacco.  Past Surgical History:  Procedure Laterality Date   BREAST CYST ASPIRATION  03/09/2012   CESAREAN SECTION     COLONOSCOPY     RIGHT HEART CATH N/A 08/10/2021   Procedure: RIGHT HEART CATH;  Surgeon: Larey Dresser, MD;  Location: [facility];  Service: Cardiovascular;  Laterality: N/A;   s/p mitral valvue replacement  05/13/2010   by Dr. Roxy Manns by minimally invasive window.    TUBAL LIGATION      Allergies  Allergen Reactions   Cephalexin Hives   Methotrexate Nausea And Vomiting    Immunization History  Administered Date(s) Administered   Influenza Split 09/22/2013, 07/11/2014   Influenza, Quadrivalent, Recombinant, Inj, Pf 07/20/2021   Influenza,inj,Quad PF,6+ Mos 05/31/2017, 07/15/2018   Influenza-Unspecified 07/25/2012, 08/22/2013, 07/29/2014, 07/17/2015, 07/06/2016   PFIZER(Purple Top)SARS-COV-2 Vaccination 06/23/2020   Pneumococcal Conjugate-13 01/04/2018   Pneumococcal Polysaccharide-23 07/23/2009, 05/10/2018   Tdap 02/10/2011   Unspecified SARS-COV-2 Vaccination 12/10/2019    Family History  Problem  Relation Age of Onset   Emphysema Mother        smoker   COPD Mother    Heart disease Maternal Grandmother        side of family not given   Diabetes Maternal Grandmother    Hypertension Father    Osteoarthritis Father    Sleep apnea Father    Breast cancer Maternal Aunt      Current Outpatient Medications:    acetaminophen (TYLENOL) 325 MG tablet, Take 650 mg by mouth every 6 (six) hours as needed for moderate pain., Disp: , Rfl:    aspirin 81 MG EC tablet, Take 81 mg by mouth daily., Disp: , Rfl:    azaTHIOprine (IMURAN) 50 MG tablet, Take 50 mg by mouth daily. , Disp: , Rfl:    Belimumab (BENLYSTA) 200 MG/ML SOSY, Inject 1 Dose into the skin every Monday., Disp: , Rfl:    calcium carbonate (OS-CAL - DOSED IN MG OF ELEMENTAL CALCIUM) 1250 (500 Ca) MG tablet, Take 2 tablets by mouth daily., Disp: , Rfl:    cholecalciferol (VITAMIN D3) 25 MCG (1000 UNIT) tablet, Take 1,000 Units by mouth daily., Disp: , Rfl:    Cyanocobalamin (B-12) 1000 MCG TABS, Take 1,000 mcg by mouth daily., Disp: , Rfl:    Folic Acid (FOLATE PO), Take 666 mcg by mouth daily., Disp: , Rfl:    furosemide (LASIX) 20 MG tablet, Take 1 tablet (20 mg total) by mouth daily. Take '40mg'$  (2 tablets) by mouth alternating with '20mg'$  (1 tablet) by mouth daily., Disp: 135 tablet, Rfl: 5   Magnesium 250 MG TABS, Take 250 mg by mouth daily., Disp: , Rfl:    metoprolol succinate (TOPROL-XL) 25 MG 24 hr tablet, TAKE 1/2 TABLET DAILY, Disp: 45 tablet, Rfl: 3   Multiple Vitamin (MULTIVITAMIN) tablet, Take 2 tablets by mouth daily., Disp: , Rfl:    potassium chloride SA (KLOR-CON M20) 20 MEQ tablet, Take 1 tablet (20 mEq total) by mouth daily., Disp: 180 tablet, Rfl: 3   predniSONE (DELTASONE) 5 MG tablet, Take 5 mg by mouth daily., Disp: ,  Rfl:    Tiotropium Bromide Monohydrate (SPIRIVA RESPIMAT) 2.5 MCG/ACT AERS, Inhale 2 puffs into the lungs daily., Disp: 4 g, Rfl: 5   valACYclovir (VALTREX) 1000 MG tablet, Take 1,000 mg by mouth  daily as needed (fever blisters)., Disp: , Rfl:    warfarin (COUMADIN) 5 MG tablet, Take as directed by the anticoagulation clinic., Disp: 135 tablet, Rfl: 3      Objective:   Vitals:   04/16/22 0951  BP: 114/72  Pulse: 99  Temp: 98.1 F (36.7 C)  TempSrc: Oral  SpO2: 100%  Weight: 187 lb 12.8 oz (85.2 kg)  Height: '5\' 4"'$  (1.626 m)    Estimated body mass index is 32.24 kg/m as calculated from the following:   Height as of this encounter: '5\' 4"'$  (1.626 m).   Weight as of this encounter: 187 lb 12.8 oz (85.2 kg).  '@WEIGHTCHANGE'$ @  [employer]   04/16/22 0951  Weight: 187 lb 12.8 oz (85.2 kg)     Physical Exam    General: No distress. Looks well Neuro: Alert and Oriented x 3. GCS 15. Speech normal Psych: Pleasant Resp:  Barrel Chest - no.  Wheeze - no, Crackles - no, No overt respiratory distress CVS: Normal heart sounds. Murmurs - no Ext: Stigmata of Connective Tissue Disease - has SLE HEENT: Normal upper airway. PEERL +. No post nasal drip        Assessment:       ICD-10-CM   1. Dyspnea on exertion  R06.09     2. Pulmonary emphysema, unspecified emphysema type ([place])  J43.9     3. Abnormal PFT  R94.2     4. History of obstructive sleep apnea  Z86.69     5. History of systemic lupus erythematosus (SLE) ([facility])  M32.9     6. Immunosuppression due to drug therapy ([place])  D84.821    Z79.899     7. Vaccine counseling  Z71.85     8. Platypnea  R06.09          Plan:     Patient Instructions     ICD-10-CM   1. Dyspnea on exertion  R06.00     2. Abnormal PFT  R94.2     3. Pulmonary emphysema, unspecified emphysema type ([place])  J43.9     4. Immunosuppression due to drug therapy ([place])  D84.821    Z79.899     5. Personal history of systemic lupus erythematosus (SLE) ([facility])  M32.9     6. History of obstructive sleep apnea  Z86.69      Overall stable. Spriiva helping. Unable to do rehab due work issues  Noted that you havw more shortness of breath  with right side down position   Plan - check alpha 1 AT check  - continue spiriva daily with albuterol as needed  -SLE management per Dr. Kathlene November (Imuran, Benlysta and prednisone] - Pulmonary hypertension management per Dr. Corrie Dandy and Lasix]  - will send message to Dr Aundra Dubin if you need bubble echo for your "platypnea" -Sleep apnea management per Dr. Wilber Bihari at night)  Follow-up - Return in 3-4 months; symptom score and simple walking desaturation test at follow-up   - check alpha 1 at followu      SIGNATURE    Dr. Brand Males, M.D., F.C.C.P,  Pulmonary and Critical Care Medicine Staff Physician, [place] Director - Interstitial Lung Disease  Program  Pulmonary [place] [facility] [place], [place],  [place]  Pager: [phone], If no answer or between  15:00h - 7:00h: call [phone] Telephone: [phone]  10:25 AM 04/16/2022

## 2022-04-16 NOTE — Telephone Encounter (Signed)
RHC last year was normal.  Reasonable to repeat echo with bubble study to see if any abnormalities.   Heather/Eileen,  Could someone set up this patient for echo with bubble study?

## 2022-04-16 NOTE — Patient Instructions (Addendum)
ICD-10-CM   1. Dyspnea on exertion  R06.09     2. Pulmonary emphysema, unspecified emphysema type ([place])  J43.9 Alpha-1 antitrypsin phenotype    Alpha-1 antitrypsin phenotype    3. Abnormal PFT  R94.2     4. History of obstructive sleep apnea  Z86.69     5. History of systemic lupus erythematosus (SLE) (HCC)  M32.9     6. Immunosuppression due to drug therapy ([place])  D84.821    Z79.899     7. Vaccine counseling  Z71.85     8. Platypnea  R06.09        Overall stable. Spriiva helping. Unable to do rehab due work issues  Noted that you havw more shortness of breath with right side down position   Plan - check alpha 1 AT check  - continue spiriva daily with albuterol as needed  -SLE management per Dr. Kathlene November (Imuran, Benlysta and prednisone] - Pulmonary hypertension management per Dr. Corrie Dandy and Lasix]  - will send message to Dr Aundra Dubin if you need bubble echo for your "platypnea" -Sleep apnea management per Dr. Wilber Bihari at night)  Follow-up - Return in 3-4 months; symptom score and simple walking desaturation test at follow-up

## 2022-04-16 NOTE — Telephone Encounter (Signed)
Attempted to contact patient to arrange No answer unable to leave message

## 2022-04-19 ENCOUNTER — Encounter (INDEPENDENT_AMBULATORY_CARE_PROVIDER_SITE_OTHER): Payer: BC Managed Care – PPO | Admitting: Ophthalmology

## 2022-04-21 LAB — ALPHA-1 ANTITRYPSIN PHENOTYPE: A-1 Antitrypsin, Ser: 140 mg/dL (ref 83–199)

## 2022-04-27 ENCOUNTER — Encounter (INDEPENDENT_AMBULATORY_CARE_PROVIDER_SITE_OTHER): Payer: BC Managed Care – PPO | Admitting: Ophthalmology

## 2022-04-27 ENCOUNTER — Encounter (INDEPENDENT_AMBULATORY_CARE_PROVIDER_SITE_OTHER): Payer: Self-pay

## 2022-04-27 DIAGNOSIS — H25813 Combined forms of age-related cataract, bilateral: Secondary | ICD-10-CM

## 2022-04-27 DIAGNOSIS — H40039 Anatomical narrow angle, unspecified eye: Secondary | ICD-10-CM

## 2022-04-27 DIAGNOSIS — H35033 Hypertensive retinopathy, bilateral: Secondary | ICD-10-CM

## 2022-04-27 DIAGNOSIS — Z79899 Other long term (current) drug therapy: Secondary | ICD-10-CM

## 2022-04-27 DIAGNOSIS — H43823 Vitreomacular adhesion, bilateral: Secondary | ICD-10-CM

## 2022-04-27 DIAGNOSIS — I10 Essential (primary) hypertension: Secondary | ICD-10-CM

## 2022-04-28 ENCOUNTER — Encounter: Payer: Self-pay | Admitting: Family Medicine

## 2022-04-28 ENCOUNTER — Ambulatory Visit: Payer: BC Managed Care – PPO | Admitting: Family Medicine

## 2022-04-28 VITALS — BP 107/77 | HR 99 | Ht 65.0 in | Wt 187.0 lb

## 2022-04-28 DIAGNOSIS — G4733 Obstructive sleep apnea (adult) (pediatric): Secondary | ICD-10-CM | POA: Diagnosis not present

## 2022-04-28 NOTE — Progress Notes (Signed)
PATIENT: Katie [name] DOB: [birth year]  REASON FOR VISIT: follow up HISTORY FROM: patient  Chief Complaint  Patient presents with   Follow-up    Pt alone, rm 16. Overall doing well. Denies any issues or concerns. DMe [employer]. Ibreeze machine     HISTORY OF PRESENT ILLNESS:  04/28/22 ALL:  Katie [name] returns for follow up for OSA on CPAP. She was last seen 10/2021 and having difficulty with compliance. She reports that she has continued to use CPAP but is not consistent. She denies any concerns with her machine or mask. She is able to tolerate CPAP when she uses it. She states that if she is in pain, she does not use her machine. Some nights she doesn't go to bed until late so she will not use CPAP. She sleeps well when she is using CPAP. 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 Dizziness has resolved. She feels medication side effects were contributing.     10/13/2021 ALL: Katie [name] is a 56 y.o. female here today for follow up for OSA on CPAP. HST 02/2021 showed severe OSa with total AHI of 86.2/hr.  She was last seen by Dr Brett Fairy 06/02/2021. She continued to report concerns of insomnia and fatigue. Dr Dohmeier felt that fatigue was most likely related to autoimmune disease as compliance data showed excellent management of OSA/CSA with residual AHI of 0. Headaches and sharp pains had significantly improved on CPAP. Dizziness continued with fatigue. She called 08/2021 reporting continued symptoms of dizziness, daytime sleepiness and fatigue. She reports these symptoms occur intermittently. She reports pain that prevents her from using CPAP. Tylenol usually helps pain. She also has insomnia and wakes multiple times at night to urinate. She is on daily prednisone. She is taking melatonin that does help her get to sleep. She does endorse more restful sleep and feels less fatigued when using CPAP. Most recently, she has not used CPAP consistently. Daily compliance at 53% and 4 hour  at 30% over last 30 days. AHI was 0. She denies difficulty staying awake while driving. She drives a bus employment. She has a significant cardiac history of CHF, CAD and cardiac tamponade post MVR in 05/2010. She is followed for pulmonary HTN. No longer on O2.    HISTORY: (copied from Dr Dohmeier's previous note)  Interval History:  Katie [name] is a 56 y.o. female patient who had been seen at [facility] and is,now re- referred by Dr Shelia Media. Dizziness and sharp headaches. RV 06-02-2021: Mrs. Cryder was last seen on 4 19-22 and had a procedure with a diagnosis of primary pulmonary hypertension on 02-1621.  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  She had a pericardial effusion and has a history of coronary artery disease pulmonary hypertension.   Her home sleep test was performed on 02-25-2021 and showed a sleep time of 7 hours with 60% REM sleep, her AHI was astronomically high at 86 apneas and hypopneas per hour.  The maximum oxygen saturation was 98% but the nadir was  only 78% she had frequent short oxygen saturations but the total desaturation time was only 2.6 minutes.  She was prescribed a CPAP with 6 to 18 cm auto titration spectrum pressure 3 cm EPR and a mask of her choice.  The goal was to follow her today and see how she has been doing the 95th percentile pressure for this patient has been 7 cmH2O the residual AHI is only 3.7/h which is a good resolution so she she has been 90% compliant which gives 27 out of 30 days of use was 26  of those days over 4 hours and an average use at time on days used of 5.3 hours.  The air leak that she initially presented with [place] in June has much decreased to the months of July. She switched to a FFM, F 20 Resmed.    She continues to use oxygen at 2 L/min bled into her auto CPAP machine. She has not found to have any more headaches  (!)but remains as fatigued and sleepy as before. The apnea is not promoting this hypersomnia or fatigue any longer, last month download of CPAP showed a residual AHI of 0.0/h on the new FFM !  She is still having insomnia:  her son is 29 and in 7th grade.     REVIEW OF SYSTEMS: Out of a complete 14 system review of symptoms, the patient complains only of the following symptoms, chronic pain, insomnia, fatigue and all other reviewed systems are negative.  ESS: 5  ALLERGIES: Allergies  Allergen Reactions   Cephalexin Hives   Methotrexate Nausea And Vomiting    HOME MEDICATIONS: Outpatient Medications Prior to Visit  Medication Sig Dispense Refill   acetaminophen (TYLENOL) 325 MG tablet Take 650 mg by mouth every 6 (six) hours as needed for moderate pain.     aspirin 81 MG EC tablet Take 81 mg by mouth daily.     azaTHIOprine (IMURAN) 50 MG tablet Take 50 mg by mouth daily.      Belimumab (BENLYSTA) 200 MG/ML SOSY Inject 1 Dose into the skin every Monday.     calcium carbonate (OS-CAL - DOSED IN MG OF ELEMENTAL CALCIUM) 1250 (500 Ca) MG tablet Take 2 tablets by mouth daily.     cholecalciferol (VITAMIN D3) 25 MCG (1000 UNIT) tablet Take 1,000 Units by mouth daily.     Cyanocobalamin (B-12) 1000 MCG TABS Take 1,000 mcg by mouth daily.     Folic Acid (FOLATE PO) Take 666 mcg by mouth daily.     furosemide (LASIX) 20 MG tablet Take 1 tablet (20 mg total) by mouth daily. Take '40mg'$  (2 tablets) by mouth alternating with '20mg'$  (1 tablet) by mouth daily. 135 tablet 5   Magnesium 250 MG TABS Take 250 mg by mouth daily.     metoprolol succinate (TOPROL-XL) 25 MG 24  hr tablet TAKE 1/2 TABLET DAILY 45 tablet 3   Multiple Vitamin (MULTIVITAMIN) tablet Take 2 tablets by mouth daily.     potassium chloride SA (KLOR-CON M20) 20 MEQ tablet Take 1 tablet (20 mEq total) by mouth daily. 180 tablet 3   predniSONE (DELTASONE) 5 MG tablet Take 5 mg by mouth daily.     Tiotropium Bromide Monohydrate (SPIRIVA RESPIMAT) 2.5 MCG/ACT AERS Inhale 2 puffs into the lungs daily. 4 g 5   valACYclovir (VALTREX) 1000 MG tablet Take 1,000 mg by mouth daily as needed (fever blisters).     warfarin (COUMADIN) 5 MG tablet Take as directed by the anticoagulation  clinic. 135 tablet 3   No facility-administered medications prior to visit.    PAST MEDICAL HISTORY: Past Medical History:  Diagnosis Date   Abscess of right breast    sternal osteomyeliitis, pseudomonas   C. 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    Cataract    Mixed OU   CHF (congestive heart failure) (HCC)    secondary to mmoderate mitral stenosis and moderate mitral regurgitation in the setting of rheumatic vlave disease as well as severe pulmonary HTN likely due to a combination of MV disease and pulmoanary arterial HTN in the setting of collagen vascular disease. Not valvuloplasty candidate due to MR. patient had MV replacement with mechanical MV in 8/11.   CHF (congestive heart failure) (HCC)    Echo post-op showed normally functioning mechanical MV, EF 60-65% with septal bounce, small mobile structure in the mid ventricle (? loose papillary muscle), RV-RA gradient 22 mmHg. Echo (10/11): EF 60-65%, normally functioning mechanical mitral vlavue.    Hypertensive retinopathy    OU   Mixed connective tissue disease ([place])    diagnosis 1990. 8/10 ANA positive, anti-dDNA positive, anti-SCL70 negative   Nephrotic syndrome    secondary to SLE   Pulmonary HTN ([place])     porbably secondary to combination of rheumatic MV disease and pulmonary arterial HYN form vollagen vascalr disease. CTA chest (7/10) with no evidence for pulmonary emboli or chronic pulmonary emboli. PA pressure 72/28 mean 50 with PVR 5.7 WU on initial RHC, PA pressure 45/21 mean 30 PVR 2.3 WU on f/u RHC on Revatio 80 mg three times a day (1/11).  After MV surgery, Revatio stopped.   Pulmonary HTN (HCC)    increased  dyspnea. Repeat RHC with mean RA 6, PA 42/20 mean 29, PVR 4.8 WU,  2.3. Revatio 20 mg  3x a day restarted. 3/11 6 min walk 460m 2/12 6 min walk 427 m.    Raynaud's syndrome    Restrictive lung disease    secondary to MCTD. PFTs 03/06/99 VC 47% DLCO 26%. PFTs 09/12/08 VC 43% DLCO 45%. PFTs 6/10 FVC 37%, FEV1 34%, ratio 69%, TLC 50%. using home O2 periodically   Rheumatic fever    Warfarin anticoagulation    for mechanical MV, goal INR 2.5-3.5    PAST SURGICAL HISTORY: Past Surgical History:  Procedure Laterality Date   BREAST CYST ASPIRATION  03/09/2012   CESAREAN SECTION     COLONOSCOPY     RIGHT HEART CATH N/A 08/10/2021   Procedure: RIGHT HEART CATH;  Surgeon: MLarey Dresser MD;  [facility];  Service: Cardiovascular;  Laterality: N/A;   s/p mitral valvue replacement  05/13/2010   by Dr. ORoxy Mannsby minimally invasive window.    TUBAL LIGATION      FAMILY HISTORY: Family History  Problem Relation Age of Onset   Emphysema Mother        smoker   COPD Mother    Heart disease Maternal Grandmother        side of family not given   Diabetes Maternal Grandmother    Hypertension Father    Osteoarthritis Father    Sleep apnea Father    Breast cancer Maternal Aunt     SOCIAL HISTORY: Social History   Socioeconomic History   Marital status: Single    Spouse name: Not on file  Number of children: Not on file   Years of education: Not on file   Highest education level: Not on file  Occupational History   Not on file  Tobacco Use   Smoking status:  Never   Smokeless tobacco: Never  Vaping Use   Vaping Use: Never used  Substance and Sexual Activity   Alcohol use: No   Drug use: No   Sexual activity: Not Currently    Birth control/protection: Other-see comments    Comment: BTL  Other Topics Concern   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    Social Determinants of Health   Financial Resource Strain: Not on file  Food Insecurity: Not on file  Transportation Needs: Not on file  Physical Activity: Not on file  Stress: Not on file  Social Connections: Not on file  Intimate Partner Violence: Not on file     PHYSICAL EXAM  Vitals:   04/28/22 1452  BP: 107/77  Pulse: 99  Weight: 187 lb (84.8 kg)  Height: '5\' 5"'$  (1.651 m)   Body mass index is 31.12 kg/m.  Generalized: Well developed, in no acute distress  Cardiology: normal rate and rhythm, no murmur noted Respiratory: clear to auscultation bilaterally  Neurological examination  Mentation: Alert oriented to time, place, history taking. Follows all commands speech and language fluent Cranial nerve II-XII: Pupils were equal round reactive to light. Extraocular movements were full, visual field were full  Motor: The motor testing reveals 5 over 5 strength of all 4 extremities. Good symmetric motor tone is noted throughout.  Gait and station: Gait is normal.    DIAGNOSTIC DATA (LABS, IMAGING, TESTING) - I reviewed patient records, labs, notes, testing and imaging myself where available.      No data to display           Lab Results  Component Value Date   WBC 3.5 (L) 03/18/2022   HGB 12.4 03/18/2022   HCT 37.5 03/18/2022   MCV 101.9 (H) 03/18/2022   PLT 215 03/18/2022      Component Value Date/Time   NA 140 03/18/2022 1142   K 3.6 03/18/2022 1142   CL 110 03/18/2022 1142   CO2 27 03/18/2022 1142   GLUCOSE 89 03/18/2022 1142   BUN 7 03/18/2022 1142   CREATININE 0.62 03/18/2022 1142    CALCIUM 8.3 (L) 03/18/2022 1142   PROT 6.5 07/01/2010 0908   ALBUMIN 3.2 (L) 07/01/2010 0908   AST 29 07/01/2010 0908   ALT 16 07/01/2010 0908   ALKPHOS 80 07/01/2010 0908   BILITOT 0.5 07/01/2010 0908   GFRNONAA >60 03/18/2022 1142   GFRAA >60 05/22/2019 1233   Lab Results  Component Value Date   CHOL 149 01/14/2016   HDL 38 (L) 01/14/2016   LDLCALC 92 01/14/2016   TRIG 93 01/14/2016   CHOLHDL 3.9 01/14/2016   Lab Results  Component Value Date   HGBA1C  05/11/2010    5.5 (NOTE)                                                                       According to the ADA Clinical Practice Recommendations for 2011, when  HbA1c is used as a screening test:   >=6.5%   Diagnostic of Diabetes Mellitus           (if abnormal result  is confirmed)  5.7-6.4%   Increased risk of developing Diabetes Mellitus  References:Diagnosis and Classification of Diabetes Mellitus,Diabetes TOIZ,1245,80(DXIPJ 1):S62-S69 and Standards of Medical Care in         Diabetes - 2011,Diabetes ASNK,5397,67  (Suppl 1):S11-S61.   No results found for: "VITAMINB12" Lab Results  Component Value Date   TSH 4.08 07/01/2010     ASSESSMENT AND PLAN 56 y.o. year old female  has a past medical history of Abscess of right breast, C. difficile diarrhea (1/12), CAD (coronary artery disease), Cardiac tamponade, Cataract, CHF (congestive heart failure) ([place]), CHF (congestive heart failure) ([place]), Hypertensive retinopathy, Mixed connective tissue disease ([place]), Nephrotic syndrome, Pulmonary HTN ([place]), Pulmonary HTN ([place]), Raynaud's syndrome, Restrictive lung disease, Rheumatic fever, and Warfarin anticoagulation. here with     ICD-10-CM   1. Severe obstructive sleep apnea-hypopnea syndrome  G47.33 For home use only DME continuous positive airway pressure (CPAP)       Wynema Birch admits that she has not been consistent with CPAP therapy. She does feel that she is better rested and has improved sleep quality when using CPAP  consistently. Headaches have resolved on CPAP. ESS 5. We have had a lengthy conversation about her sleep study results and treatment options for sleep apnea. She does express a desire to continue using CPAP. Compliance report reveals sub optimal compliance. She was encouraged to continue using CPAP nightly and for greater than 4 hours each night. We have discussed benefits of using CPAP and association of sleep apnea in worsening pain, insomnia, nocturia, headaches, etc. Risks of untreated sleep apnea review and education materials provided. Healthy lifestyle habits encouraged. She will follow up in 6 months, sooner if needed. She verbalizes understanding and agreement with this plan.    Orders Placed This Encounter  Procedures   For home use only DME continuous positive airway pressure (CPAP)    Supplies    Order Specific Question:   Length of Need    Answer:   Lifetime    Order Specific Question:   Patient has OSA or probable OSA    Answer:   Yes    Order Specific Question:   Is the patient currently using CPAP in the home    Answer:   Yes    Order Specific Question:   Settings    Answer:   Other see comments    Order Specific Question:   CPAP supplies needed    Answer:   Mask, headgear, cushions, filters, heated tubing and water chamber     No orders of the defined types were placed in this encounter.     Debbora Presto, FNP-C 04/28/2022, 3:25 PM [facility] [address], [place], [place] [address] [phone]

## 2022-04-28 NOTE — Patient Instructions (Signed)
Please continue using your CPAP regularly. While your insurance requires that you use CPAP at least 4 hours each night on 70% of the nights, I recommend, that you not skip any nights and use it throughout the night if you can. Getting used to CPAP and staying with the treatment long term does take time and patience and discipline. Untreated obstructive sleep apnea when it is moderate to severe can have an adverse impact on cardiovascular health and raise her risk for heart disease, arrhythmias, hypertension, congestive heart failure, stroke and diabetes. Untreated obstructive sleep apnea causes sleep disruption, nonrestorative sleep, and sleep deprivation. This can have an impact on your day to day functioning and cause daytime sleepiness and impairment of cognitive function, memory loss, mood disturbance, and problems focussing. Using CPAP regularly can improve these symptoms.  Please keep working on compliance. Let me know if you need anything.   Follow up in 6 months

## 2022-05-07 ENCOUNTER — Encounter (INDEPENDENT_AMBULATORY_CARE_PROVIDER_SITE_OTHER): Payer: Self-pay | Admitting: Ophthalmology

## 2022-05-07 ENCOUNTER — Ambulatory Visit (INDEPENDENT_AMBULATORY_CARE_PROVIDER_SITE_OTHER): Payer: BC Managed Care – PPO | Admitting: Ophthalmology

## 2022-05-07 DIAGNOSIS — H04123 Dry eye syndrome of bilateral lacrimal glands: Secondary | ICD-10-CM

## 2022-05-07 DIAGNOSIS — H35033 Hypertensive retinopathy, bilateral: Secondary | ICD-10-CM | POA: Diagnosis not present

## 2022-05-07 DIAGNOSIS — H25813 Combined forms of age-related cataract, bilateral: Secondary | ICD-10-CM

## 2022-05-07 DIAGNOSIS — I10 Essential (primary) hypertension: Secondary | ICD-10-CM | POA: Diagnosis not present

## 2022-05-07 DIAGNOSIS — Z79899 Other long term (current) drug therapy: Secondary | ICD-10-CM

## 2022-05-07 DIAGNOSIS — H43823 Vitreomacular adhesion, bilateral: Secondary | ICD-10-CM | POA: Diagnosis not present

## 2022-05-07 DIAGNOSIS — H40039 Anatomical narrow angle, unspecified eye: Secondary | ICD-10-CM

## 2022-05-07 NOTE — Progress Notes (Signed)
[facility] Note  05/07/2022     CHIEF COMPLAINT Patient presents for Retina Follow Up    HISTORY OF PRESENT ILLNESS: Katie [name] is a 56 y.o. female who presents to the clinic today for:   HPI     Retina Follow Up   Patient presents with  Other.  In both eyes.  Severity is moderate.  Since onset it is stable.  I, the attending physician,  performed the HPI with the patient and updated documentation appropriately.        Comments   Pt here for ret f/u [facility] OU. Pt states [facility] about the same but states she has a haze in her vision that comes and goes. She notices it especially when driving. Has new rx specs on order.       Last edited by Bernarda Caffey, MD on 05/07/2022  9:20 AM.    Patient states the vision is hazy at times.   Referring physician: Deland Pretty, MD [place],  [place] [address]  HISTORICAL INFORMATION:   Selected notes from the MEDICAL RECORD NUMBER Referred by Dr. Quentin Ore for concern of plaquenil toxicity LEE:  Ocular Hx- PMH-   CURRENT MEDICATIONS: No current outpatient medications on file. (Ophthalmic Drugs)   No current facility-administered medications for this visit. (Ophthalmic Drugs)   Current Outpatient Medications (Other)  Medication Sig   acetaminophen (TYLENOL) 325 MG tablet Take 650 mg by mouth every 6 (six) hours as needed for moderate pain.   aspirin 81 MG EC tablet Take 81 mg by mouth daily.   azaTHIOprine (IMURAN) 50 MG tablet Take 50 mg by mouth daily.    Belimumab (BENLYSTA) 200 MG/ML SOSY Inject 1 Dose into the skin every Monday.   calcium carbonate (OS-CAL - DOSED IN MG OF ELEMENTAL CALCIUM) 1250 (500 Ca) MG tablet Take 2 tablets by mouth daily.   cholecalciferol (VITAMIN D3) 25 MCG (1000 UNIT) tablet Take 1,000 Units by mouth daily.   Cyanocobalamin (B-12) 1000 MCG TABS Take 1,000 mcg by mouth daily.   Folic Acid (FOLATE PO) Take 666 mcg by mouth daily.   furosemide  (LASIX) 20 MG tablet Take 1 tablet (20 mg total) by mouth daily. Take 68m (2 tablets) by mouth alternating with 218m(1 tablet) by mouth daily.   Magnesium 250 MG TABS Take 250 mg by mouth daily.   metoprolol succinate (TOPROL-XL) 25 MG 24 hr tablet TAKE 1/2 TABLET DAILY   Multiple Vitamin (MULTIVITAMIN) tablet Take 2 tablets by mouth daily.   potassium chloride SA (KLOR-CON M20) 20 MEQ tablet Take 1 tablet (20 mEq total) by mouth daily.   predniSONE (DELTASONE) 5 MG tablet Take 5 mg by mouth daily.   Tiotropium Bromide Monohydrate (SPIRIVA RESPIMAT) 2.5 MCG/ACT AERS Inhale 2 puffs into the lungs daily.   valACYclovir (VALTREX) 1000 MG tablet Take 1,000 mg by mouth daily as needed (fever blisters).   warfarin (COUMADIN) 5 MG tablet Take as directed by the anticoagulation clinic.   No current facility-administered medications for this visit. (Other)   REVIEW OF SYSTEMS: ROS   Positive for: Musculoskeletal, Cardiovascular, Eyes Negative for: Constitutional, Gastrointestinal, Neurological, Skin, Genitourinary, HENT, Endocrine, Respiratory, Psychiatric, Allergic/Imm, Heme/Lymph Last edited by SiKingsley SpittleCOT on 05/07/2022  7:54 AM.     ALLERGIES Allergies  Allergen Reactions   Cephalexin Hives   Methotrexate Nausea And Vomiting   PAST MEDICAL HISTORY Past Medical History:  Diagnosis Date   Abscess  of right breast    sternal osteomyeliitis, pseudomonas   C. 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    Cataract    Mixed OU   CHF (congestive heart failure) (HCC)    secondary to mmoderate mitral stenosis and moderate mitral regurgitation in the setting of rheumatic vlave disease as well as severe pulmonary HTN likely due to a combination of MV disease and pulmoanary arterial HTN in the setting of collagen vascular disease. Not  valvuloplasty candidate due to MR. patient had MV replacement with mechanical MV in 8/11.   CHF (congestive heart failure) (HCC)    Echo post-op showed normally functioning mechanical MV, EF 60-65% with septal bounce, small mobile structure in the mid ventricle (? loose papillary muscle), RV-RA gradient 22 mmHg. Echo (10/11): EF 60-65%, normally functioning mechanical mitral vlavue.    Hypertensive retinopathy    OU   Mixed connective tissue disease ([place])    diagnosis 1990. 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 CTA chest (7/10) with no evidence for pulmonary emboli or chronic pulmonary emboli. PA pressure 72/28 mean 50 with PVR 5.7 WU on initial RHC, PA pressure 45/21 mean 30 PVR 2.3 WU on f/u RHC on Revatio 80 mg three times a day (1/11).  After MV surgery, Revatio stopped.   Pulmonary HTN (HCC)    increased  dyspnea. Repeat RHC with mean RA 6, PA 42/20 mean 29, PVR 4.8 WU,  2.3. Revatio 20 mg  3x a day restarted. 3/11 6 min walk 434m 2/12 6 min walk 427 m.    Raynaud's syndrome    Restrictive lung disease    secondary to MCTD. PFTs 03/06/99 VC 47% DLCO 26%. PFTs 09/12/08 VC 43% DLCO 45%. PFTs 6/10 FVC 37%, FEV1 34%, ratio 69%, TLC 50%. using home O2 periodically   Rheumatic fever    Warfarin anticoagulation    for mechanical MV, goal INR 2.5-3.5   Past Surgical History:  Procedure Laterality Date   BREAST CYST ASPIRATION  03/09/2012   CESAREAN SECTION     COLONOSCOPY     RIGHT HEART CATH N/A 08/10/2021   Procedure: RIGHT HEART CATH;  Surgeon: MLarey Dresser MD;  [facility];  Service: Cardiovascular;  Laterality: N/A;   s/p mitral valvue replacement  05/13/2010   by Dr. ORoxy Mannsby minimally invasive window.    TUBAL LIGATION     FAMILY HISTORY Family History  Problem  Relation Age of Onset   Emphysema Mother        smoker   COPD Mother    Heart disease Maternal Grandmother        side of family not given   Diabetes Maternal Grandmother    Hypertension Father    Osteoarthritis Father    Sleep apnea Father    Breast cancer Maternal Aunt    SOCIAL HISTORY Social History   Tobacco Use   Smoking status: Never   Smokeless tobacco: Never  Vaping Use   Vaping Use: Never used  Substance Use Topics   Alcohol use: No   Drug use: No       OPHTHALMIC EXAM:  Base Eye Exam  Visual Acuity (Snellen - Linear)       Right Left   Dist cc 20/25 20/25    Correction: Glasses         Tonometry (Tonopen, 7:59 AM)       Right Left   Pressure 14 16         Pupils       Pupils Dark Light Shape React APD   Right PERRL 3 2 Round Brisk None   Left PERRL 3 2 Round Brisk None         Visual Fields (Counting fingers)       Left Right    Full Full         Extraocular Movement       Right Left    Full, Ortho Full, Ortho    -- -- --  --  --  -- -- --   -- -- --  --  --  -- -- --           Neuro/Psych     Oriented x3: Yes   Mood/Affect: Normal         Dilation     Both eyes: 1.0% Mydriacyl, 2.5% Phenylephrine @ 8:01 AM           Slit Lamp and Fundus Exam     Slit Lamp Exam       Right Left   Lids/Lashes Dermatochalasis - upper lid, mild Meibomian gland dysfunction Dermatochalasis - upper lid, mild Meibomian gland dysfunction   Conjunctiva/Sclera trace melanosis Mild melanosis   Cornea Trace Punctate epithelial erosions, trace tear film debris Trace Punctate epithelial erosions, trace tear film debris   Anterior Chamber Deep and clear, narrow temporal angle, No cell or flare Deep and clear, narrow temporal angle, No cell or flare   Iris Round and dilated, PI 1130 Round and dilated, PI 100   Lens 2+ Nuclear sclerosis, 2-3+ Cortical cataract, 2-3+ Posterior subcapsular cataract 2+ Nuclear sclerosis, 2-3+  Cortical cataract, 1-2+ Posterior subcapsular cataract   Anterior Vitreous Vitreous syneresis Vitreous syneresis         Fundus Exam       Right Left   Disc Pink and Sharp, +cupping Pink and Sharp, +cupping, mild PPP   C/D Ratio 0.7 0.8   Macula Flat, Good foveal reflex, RPE mottling and clumping, +small central cyst, No heme Flat, Good foveal reflex, RPE mottling and clumping, trace cystic changes centrally, No heme   Vessels mild attenuation, Copper wiring mild attenuation, mild Copper wiring   Periphery Attached, No heme Attached, no heme           Refraction     Wearing Rx       Sphere Cylinder Axis Add   Right +1.00 +1.25 029 +1.75   Left +0.25 +1.50 137 +1.75           IMAGING AND PROCEDURES  Imaging and Procedures for '@TODAY' @  OCT, Retina - OU - Both Eyes       Right Eye Quality was good. Central Foveal Thickness: 264. Progression has been stable. Findings include no SRF, abnormal foveal contour, intraretinal fluid, vitreous traction (persistent VMT with central cyst; no ellipsoid loss).   Left Eye Quality was good. Central Foveal Thickness: 249. Progression has improved. Findings include no SRF, abnormal foveal contour, intraretinal fluid, vitreous traction (Interval improvement in VMT-- decreased central cyst; no ellipsoid loss).   Notes *Images captured and stored on drive  Diagnosis / Impression:  OD: persistent VMT with  central cyst; no ellipsoid loss OS: Interval improvement in VMT-- decreased central cyst; no ellipsoid loss No plaquenil toxicity OU  Clinical management:  See below  Abbreviations: NFP - Normal foveal profile. CME - cystoid macular edema. PED - pigment epithelial detachment. IRF - intraretinal fluid. SRF - subretinal fluid. EZ - ellipsoid zone. ERM - epiretinal membrane. ORA - outer retinal atrophy. ORT - outer retinal tubulation. SRHM - subretinal hyper-reflective material            ASSESSMENT/PLAN:   ICD-10-CM   1.  Vitreomacular adhesion of both eyes  H43.823 OCT, Retina - OU - Both Eyes    2. Long-term use of Plaquenil  Z79.899     3. Essential hypertension  I10     4. Anatomical narrow angle glaucoma  H40.039     5. Hypertensive retinopathy of both eyes  H35.033     6. Combined forms of age-related cataract of both eyes  H25.813     7. Bilateral dry eyes  H04.123      1,2. VMT OU  - OCT shows OD: persistent VMT with central cyst; OS: Interval improvement in VMT-- decreased central cyst  - pt denies significant vision changes or metamorphopsia  - BCVA OD 20/25, OS 20/25 - stable  - discussed findings, prognosis, including possible development of macular hole  - continue amsler grid monitoring  - f/u in 4 months, sooner prn -- DFE, OCT  3. History of Long term Plaquenil use  - pt reports history of taking Plaquenil for lupus (260m BID) for ~20 years  - pt stopped taking plaquenil beginning of June 2021  - 400 mg/day --> 4.77 mg/kg/day  - pt had VF loss at Dr. WKathleen Argueoffice consistent with toxic maculopathy  - OCT today shows ?tr parafoveal ellipsoid thinning OU -- no frank EZ loss -- no significant change from prior  - discussed findings, prognosis and possibility of progression despite discontinuation of medication  - monitor  - f/u in 4 months, DFE, OCT  4,5. Hypertensive retinopathy OU  - discussed importance of tight BP control  - continue to monitor  6. Narrow angle glaucoma OU  - IOP today: OD 14, OS 16  - PI's OU  - under the expert management of Dr. VLucianne Lei 7. Mixed cataracts OU  - The symptoms of cataract, surgical options, and treatments and risks were discussed with patient.   - under the expert management of Dr. VLucianne Lei(next appt. 10/2022) - clear from a retina standpoint to proceed with cataract surgery when pt and surgeon are ready   8. Dry eyes OU - recommend artificial tears and lubricating ointment as needed   Ophthalmic Meds Ordered this visit:  No orders of the  defined types were placed in this encounter.    Return in about 4 months (around 09/07/2022) for VMT OU , DFE, OCT.  There are no Patient Instructions on file for this visit.  This document serves as a record of services personally performed by BGardiner Sleeper MD, PhD. It was created on their behalf by MOrvan Falconer an ophthalmic technician. The creation of this record is the provider's dictation and/or activities during the visit.    Electronically signed by: MOrvan Falconer OA, 05/07/22  9:21 AM  This document serves as a record of services personally performed by BGardiner Sleeper MD, PhD. It was created on their behalf by CRenaldo Reel COT an ophthalmic technician. The creation of this record is the provider's dictation and/or activities  during the visit.    Electronically signed by:  Renaldo Reel, COT  05/07/22  9:21 AM  Gardiner Sleeper, M.D., Ph.D. Diseases & Surgery of the Retina and Vitreous [facility]  I have reviewed the above documentation for accuracy and completeness, and I agree with the above. Gardiner Sleeper, M.D., Ph.D. 05/07/22 9:24 AM   Abbreviations: M myopia (nearsighted); A astigmatism; H hyperopia (farsighted); P presbyopia; Mrx spectacle prescription;  CTL contact lenses; OD right eye; OS left eye; OU both eyes  XT exotropia; ET esotropia; PEK punctate epithelial keratitis; PEE punctate epithelial erosions; DES dry eye syndrome; MGD meibomian gland dysfunction; ATs artificial tears; PFAT's preservative free artificial tears; [place] nuclear sclerotic cataract; PSC posterior subcapsular cataract; ERM epi-retinal membrane; PVD posterior vitreous detachment; RD retinal detachment; DM diabetes mellitus; DR diabetic retinopathy; NPDR non-proliferative diabetic retinopathy; PDR proliferative diabetic retinopathy; CSME clinically significant macular edema; DME diabetic macular edema; dbh dot blot hemorrhages; CWS cotton wool spot; POAG  primary open angle glaucoma; C/D cup-to-disc ratio; HVF humphrey visual field; GVF goldmann visual field; OCT optical coherence tomography; IOP intraocular pressure; BRVO Branch retinal vein occlusion; CRVO central retinal vein occlusion; CRAO central retinal artery occlusion; BRAO branch retinal artery occlusion; RT retinal tear; SB scleral buckle; PPV pars plana vitrectomy; VH Vitreous hemorrhage; PRP panretinal laser photocoagulation; IVK intravitreal kenalog; VMT vitreomacular traction; MH Macular hole;  NVD neovascularization of the disc; NVE neovascularization elsewhere; AREDS age related eye disease study; ARMD age related macular degeneration; POAG primary open angle glaucoma; EBMD epithelial/anterior basement membrane dystrophy; ACIOL anterior chamber intraocular lens; IOL intraocular lens; PCIOL posterior chamber intraocular lens; Phaco/IOL phacoemulsification with intraocular lens placement; [place] photorefractive keratectomy; LASIK laser assisted in situ keratomileusis; HTN hypertension; DM diabetes mellitus; COPD chronic obstructive pulmonary disease

## 2022-06-02 ENCOUNTER — Ambulatory Visit: Payer: BC Managed Care – PPO | Admitting: Family Medicine

## 2022-07-19 ENCOUNTER — Other Ambulatory Visit: Payer: Self-pay | Admitting: Internal Medicine

## 2022-07-21 ENCOUNTER — Ambulatory Visit: Payer: BC Managed Care – PPO | Admitting: Family Medicine

## 2022-07-21 ENCOUNTER — Other Ambulatory Visit: Payer: Self-pay | Admitting: Internal Medicine

## 2022-07-21 DIAGNOSIS — Z1231 Encounter for screening mammogram for malignant neoplasm of breast: Secondary | ICD-10-CM

## 2022-07-22 ENCOUNTER — Telehealth: Payer: Self-pay | Admitting: Family Medicine

## 2022-07-22 NOTE — Telephone Encounter (Signed)
Pt called wanting to know if she can be referred to the ENT she was referred to last time. Please advise.

## 2022-07-22 NOTE — Telephone Encounter (Signed)
Called pt and informed her to contact her PCP Dr. Shelia Media for new referral to the ENT. Pt verbalized understanding. Pt had no questions at this time but was encouraged to call back if questions arise.

## 2022-07-29 ENCOUNTER — Encounter: Payer: Self-pay | Admitting: Internal Medicine

## 2022-07-29 ENCOUNTER — Ambulatory Visit: Payer: BC Managed Care – PPO | Admitting: Internal Medicine

## 2022-07-29 VITALS — BP 110/68 | HR 90 | Temp 97.7°F | Ht 65.0 in | Wt 187.4 lb

## 2022-07-29 DIAGNOSIS — Z8669 Personal history of other diseases of the nervous system and sense organs: Secondary | ICD-10-CM

## 2022-07-29 DIAGNOSIS — R0609 Other forms of dyspnea: Secondary | ICD-10-CM

## 2022-07-29 DIAGNOSIS — Z23 Encounter for immunization: Secondary | ICD-10-CM

## 2022-07-29 DIAGNOSIS — J439 Emphysema, unspecified: Secondary | ICD-10-CM | POA: Diagnosis not present

## 2022-07-29 DIAGNOSIS — M329 Systemic lupus erythematosus, unspecified: Secondary | ICD-10-CM

## 2022-07-29 DIAGNOSIS — Z79899 Other long term (current) drug therapy: Secondary | ICD-10-CM

## 2022-07-29 DIAGNOSIS — D84821 Immunodeficiency due to drugs: Secondary | ICD-10-CM

## 2022-07-29 NOTE — Progress Notes (Signed)
OV 11/19/2020  Subjective:  Patient ID: Katie [name], female , DOB: 1966/02/01 , age 56 y.o. , MRN: [id number] , ADDRESS: [address] [place] [address] PCP Deland Pretty, MD Patient Care Team: Deland Pretty, MD as PCP - General (Internal Medicine)  This Provider for this visit: Treatment Team:  Attending Provider: Brand Males, MD    11/19/2020 -   Chief Complaint  Patient presents with   Consult    ILD consult. Chronic cough, SOB with exertion. Some wheezing     HPI Katie [name] 56 y.o. -presents for new consultation.  She is to see Dr. Christinia Gully but that was several years ago and therefore is a new consultation.  She has been referred by Dr. Loralie Champagne based on chart review and her history.  She has mitral valve prolapse and she says on a 6-minute walk test she desaturated.  And given her lupus she has been referred here.  She gives a history of SLE since early 44s.  She is currently on immunosuppressed with prednisone, Imuran and also Benlysta infusions.  She says lupus initially involved her kidneys but does not at this point in time.  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  Also involves the heart and is the basis of mitral valve repair.  She says she also has Raynaud's.  Overall she feels lupus is well controlled.  She works as a Teacher, early years/pre  In terms of pulmonary in 2012 she had CT scan of the chest that reported emphysema but no obvious description of fibrosis but chest x-rays in 2015 reported fibrosis she does not have any CT scan of the chest at that time.  Her last PFTs were in 2015 that is reviewed below and visualized the image myself.  It shows restriction with a low DLCO consistent with someone with fibrosis.  In terms of her symptoms she has stable dyspnea on exertion.  She works as a Teacher, early years/pre.  She has chronic cough that for which she believes she will benefit from any  symptom relief.  She has had a Covid vaccine but the response to the vaccine is not known.  There is no Covid IgG check so far.  She is open to using oxygen but does not want to use it at work.  She denies any mold exposure or bird exposure or feather dust exposure or having down pillow   Echocardiogram #2021  -Normal functioning mitral valve that was repaired.  Normal ejection fraction normal right ventricular function   Sleep study 2017   -Apnea-hypopnea index 2.7  Results for JENILLE, LASZLO (MRN [id number]) as of 11/19/2020 10:00  Ref. Range 06/10/2009 20:08  Anti Nuclear Antibody (ANA) Latest Ref Range: NEGATIVE  POS (A)  ANA Pattern 1 Unknown SPECKLED  ANA Titer 1 Latest Ref Range: <1:40  1:320 (H)  ds DNA Ab Latest Ref Range: <5  69 (H)  Scleroderma (Scl-70) (ENA) Antibody, IgG Latest Ref Range: <1.0  0.5     Walk test   Simple office walk 185 feet x  3 laps goal with forehead probe 11/19/2020   O2 used ra  Number laps completed 3  Comments about pace x  Resting Pulse Ox/HR 100% and 87/min  Final Pulse Ox/HR 88% and 110/min  Desaturated </= 88% yes  Desaturated <= 3% points yes  Got Tachycardic >/= 90/min yes  Symptoms at end of test x  Miscellaneous  comments x   '  CT Chest data    PFT   Results for MYKAILA, BLUNCK (MRN [id number]) as of 11/19/2020 10:00  Ref. Range 08/19/2020 10:33  Creatinine Latest Ref Range: 0.44 - 1.00 mg/dL 0.69  Results for ANETA, HENDERSHOTT (MRN [id number]) as of 11/19/2020 10:00  Ref. Range 08/19/2020 10:33  Hemoglobin Latest Ref Range: 12.0 - 15.0 g/dL 13.1   ECHO 09/02/2020  IMPRESSIONS     1. Left ventricular ejection fraction, by estimation, is 60 to 65%. The  left ventricle has normal function. The left ventricle has no regional  wall motion abnormalities. Left ventricular diastolic function could not  be evaluated.   2. Right ventricular systolic function was not well visualized. The right  ventricular size is normal. There is  normal pulmonary artery systolic  pressure. The estimated right ventricular systolic pressure is 35.0 mmHg.   3. Left atrial size was mildly dilated.   4. The mitral valve has been repaired/replaced. No evidence of mitral  valve regurgitation. No evidence of mitral stenosis. The mean mitral valve  gradient is 5.2 mmHg with average heart rate of 89 bpm. There is a  mechanical valve present in the mitral  position. Procedure Date: 05/19/2010. Echo findings are consistent with  normal structure and function of the mitral valve prosthesis.   5. The aortic valve is normal in structure. Aortic valve regurgitation is  not visualized. No aortic stenosis is present.   6. The inferior vena cava is normal in size with greater than 50%  respiratory variability, suggesting right atrial pressure of 3 mmHg.   Comparison(s): No significant change from prior study. Prior images  reviewed side by side.   FINDINGS   Left Ventricle: Left ventricular ejection fraction, by estimation, is 60  to 65%. The left ventricle has normal function. The left ventricle has no  regional wall motion abnormalities. The left ventricular internal cavity  size was normal in size. There is   no left ventricular hypertrophy. Abnormal (paradoxical) septal motion  consistent with post-operative status. Left ventricular diastolic function  could not be evaluated due to mitral valve replacement. Left ventricular  diastolic function could not be  evaluated.   Right Ventricle: The right ventricular size is normal. No increase in  right ventricular wall thickness. Right ventricular systolic function was  not well visualized. There is normal pulmonary artery systolic pressure.  The tricuspid regurgitant velocity is   2.34 m/s, and with an assumed right atrial pressure of 3 mmHg, the  estimated right ventricular systolic pressure is 09.3 mmHg.   Left Atrium: Left atrial size was mildly dilated.   Right Atrium: Right atrial size  was normal in size.   Pericardium: There is no evidence of pericardial effusion.   Mitral Valve: The mitral valve has been repaired/replaced. No evidence of  mitral valve regurgitation. There is a mechanical valve present in the  mitral position. Procedure Date: 05/19/2010. Echo findings are consistent  with normal structure and function  of the mitral valve prosthesis. No evidence of mitral valve stenosis. The  mean mitral valve gradient is 5.2 mmHg with average heart rate of 89 bpm.   Tricuspid Valve: The tricuspid valve is normal in structure. Tricuspid  valve regurgitation is mild . No evidence of tricuspid stenosis.   Aortic Valve: The aortic valve is normal in structure. Aortic valve  regurgitation is not visualized. No aortic stenosis is present.   Pulmonic Valve: The pulmonic valve was normal in structure. Pulmonic valve  regurgitation is not visualized. No evidence of pulmonic stenosis.   Aorta: The aortic root is normal in size and structure.   Venous: The inferior vena cava is normal in size with greater than 50%  respiratory variability, suggesting right atrial pressure of 3 mmHg.   IAS/Shunts: No atrial level shunt detected by color flow      OV 02/05/2021  Subjective:  Patient ID: Katie [name], female , DOB: 04/16/1966 , age 54 y.o. , MRN: [id number] , ADDRESS: [address] [place] [address] PCP Deland Pretty, MD Patient Care Team: Deland Pretty, MD as PCP - General (Internal Medicine)  This Provider for this visit: Treatment Team:  Attending Provider: Brand Males, MD    02/05/2021 -   Chief Complaint  Patient presents with   Follow-up    Doing ok, breathing is the same     HPI Katie [name] 56 y.o. - returns for follow-up.  She has lupus immunosuppression restricted PFTs exertional hypoxemia and exertional dyspnea.  High concern for interstitial lung disease but she had high-resolution CT chest the chest shows emphysema.  She had  pulmonary function test that still shows restriction and low DLCO but it is stable compared to 05-Jan-2014.  I interviewed her.  Her mom used to be a heavy smoker and she died in 01/05/05.  During this time patient was heavily exposed to cigarette smoke at home.  The current CT is similar to Jan 06, 2011 many years ago which showed emphysema.  Patient is not using exertional oxygen because of poor customer service from [employer].  She does not want to be with that agency again.  She is willing to try other treatment for emphysema.  I had a to the ILD exposure questionnaire.  It appears that she has shortness of breath for 13 years gradually getting worse.  Symptom score is below.  She also has a chronic cough that is moderate in intensity and is been going on since 01/06/08.  She does have lupus she does have Raynaud's.  She has fatigue she has arthralgia she is really not she does have some snoring.  Family history is positive for emphysema in her mother.  Organic and inorganic antigen exposure history is negative    CT Chest data 12/01/20   IMPRESSION: 1. Emphysema (ICD10-J43.9) with scattered pulmonary parenchymal scarring. No definitive evidence of superimposed interstitial lung disease. 2. Aortic atherosclerosis (ICD10-I70.0). Coronary artery calcification.     Electronically Signed   By: Lorin Picket M.D.   On: 12/01/2020 13:59     OV 06/11/2021  Subjective:  Patient ID: Katie [name], female , DOB: Apr 10, 1966 , age 61 y.o. , MRN: [id number] , ADDRESS: [address] [place] [address] PCP Deland Pretty, MD Patient Care Team: Deland Pretty, MD as PCP - General (Internal Medicine)  This Provider for this visit: Treatment Team:  Attending Provider: Brand Males, MD    06/11/2021 -   Chief Complaint  Patient presents with   Follow-up    Pt states she has been doing okay since last visit. States that she still is becoming SOB with exertion.   HPI Katie [name] 56 y.o. -presents  for follow-up.  She was originally referred because of concern of lupus related interstitial lung disease because of the presence of lupus, immunosuppression and restrictive PFTs.  However admitted high-resolution CT chest earlier this year there is only emphysema.  She gave a strong history of passive smoking.  So when I saw her in  April 2022 started on Spiriva.  She is here for follow-up.  She tells me that the fatigue and cough is significantly improved with Spiriva but shortness of breath persists.  She says overall shortness of breath is stable but looking at the score shortness of breath might be worse.  Review of the chart indicate that in the interim Dr. Aundra Dubin is the main increase Lasix for her.  There is no active smoking or passive smoking currently.  She has never been to pulmonary rehabilitation.  She is using CPAP since spring 2022.  She is also using oxygen at night.       OV 04/16/2022  Subjective:  Patient ID: Katie [name], female , DOB: 07/22/1966 , age 45 y.o. , MRN: [id number] , ADDRESS: [address] [place] [address] PCP Deland Pretty, MD Patient Care Team: Deland Pretty, MD as PCP - General (Internal Medicine)  This Provider for this visit: Treatment Team:  Attending Provider: Brand Males, MD    04/16/2022 -   Chief Complaint  Patient presents with   Follow-up    PFT performed today.  Pt states she has been doing okay since last visit and denies any complaints.    Follow-up   HPI Katie [name] 56 y.o. -presents for routine follow-up.  Last seen in September 2022.  She says she cannot do pulmonary rehabilitation because of her job and logistics.  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  Overall she says she is doing fairly well.  Her main issues are that when she lies down on the right side she gets short of breath.  She says this been going on since the diagnose of pulmonary hypertension.  She also  explains that when she tries to crunch her abdomen and bent down to pick up something she gets more short of breath otherwise routine activities are fine.  Explained this is because of diaphragmatic compression.  There is no longer any passive smoking exposure.  In the past she had significant passive smoking exposure.  She had lung function today that shows severe obstruction with an FEV1 of 0.8 L / 31% and a ratio of 69.  Diffusion is also reduced at 10.7/51%.  Flow volume loop is consistent with obstruction.  Spiriva is working well for her.    OV 07/29/2022  Subjective:  Patient ID: Katie [name], female , DOB: [birth year] , age 56 y.o. , MRN: [id number] , ADDRESS: [address] 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 Brett Fairy on CPAP at night since spring 2022   -Original sleep study 2017 -May 2022 sleep studyL: 02-25-2021 and showed a sleep time of 7 hours with 60% REM sleep, her AHI was astronomically high at 86 apneas and hypopneas per hour.  The maximum oxygen saturation was 98% but the nadir was only 78% she had frequent short oxygen saturations but the total desaturation time was only 2.6 minutes.  She was prescribed a CPAP with 6 to 18 cm au  -Last evaluation August 2022 by Dr. 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 Loralie Champagne.  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 Loralie Champagne  -CT angiogram chest July 2010 with no evidence of PE  -Status post mitral valve replacement in 2011  -On mechanical anticoagulation  -Unclear date of most recent cardiac cath but apparently improved since mitral valve replacement per Dr. Aundra Dubin notes November 2021  -Most recent echo November 7253 with RV systolic pressure 25 and normal EF left ventricle.  Unable to estimate diastolic function  -Right heart cath October 2022 after stopping sildenafil showed normal PA pressures and filling pressures.  -Restricted PFTs on evaluation but CT scan of the chest 2022 with emphysema and strong history of passive smoking   -No ILD on CT chest`  -Started Spiriva April 2022  07/29/2022 -   Chief Complaint  Patient presents with   Follow-up    Pt states she has been doing okay since last visit and denies any complaints.     HPI Katie [name] 56 y.o. -returns for follow-up.  Not seen her since July 2023.  She is stable.  The platypnea is resolved.  She believes that after she increased her CPAP the platypnea resolved.  She does not have much shortness of breath.  It is all stable.  She feels.  Was adequate enough she does not want increase the inhalers.  She is off her of audio based on cardiology notes.  She continues on Imuran and Benlysta.  She will have a flu shot today.       SYMPTOM SCALE - emphysema, luspus 02/05/2021  06/11/2021 spiriva  O2 use ra ra  Shortness of Breath 0 -> 5 scale with 5 being worst (score 6 If unable to do)   At rest 0 0  Simple tasks - showers, clothes change, eating, shaving 0 1  Household (dishes, doing bed, laundry) 1 3  Shopping 3 4  Walking level at own pace 4 5  Walking up Stairs 5 5  Total (30-36) Dyspnea Score 13 18  How bad is  your cough? 5 2  How bad is your fatigue 5 3  How bad is nausea 0 0  How bad is vomiting?  0 0  How bad is diarrhea? 0 0  How bad is anxiety? 3 2  How bad is depression 2 2      Walk test  Simple office walk 185 feet x  3 laps goal with forehead probe 11/19/2020  06/11/2021   O2 used ra ra  Number laps completed 3 3  Comments about pace x avg  Resting Pulse Ox/HR 100% and 87/min 100% and 102  Final Pulse Ox/HR 88% and 110/min 95% and 130  Desaturated </= 88% yes no  Desaturated <= 3% points yes Yes 5 ponts  Got Tachycardic >/= 90/min yes Yes tachy even at rest  Symptoms at end of test x Moderate dyspnea  Miscellaneous comments x      CT Chest data  No results found.    PFT     Latest Ref Rng & Units 04/16/2022    9:00 AM 12/05/2020    8:58 AM 08/16/2014    9:02  AM 02/08/2014    9:57 AM  PFT Results  FVC-Pre L 1.22  1.17  1.16  1.14   FVC-Predicted Pre % 35  41  39  38   FVC-Post L  1.27  1.25  1.16   FVC-Predicted Post %  44  42  39   Pre FEV1/FVC % % 69  72  73  71   Post FEV1/FCV % %  69  76  78   FEV1-Pre L 0.84  0.84  0.85  0.82   FEV1-Predicted Pre % 31  37  35  34   FEV1-Post L  0.88  0.95  0.90   DLCO uncorrected ml/min/mmHg 10.69  9.57  11.83  11.54   DLCO UNC% % 51  45  48  47   DLCO corrected ml/min/mmHg 11.05      DLCO COR %Predicted % 53      DLVA Predicted % 107  102  104  99   TLC L  3.28  3.23  3.04   TLC % Predicted %  64  63  60   RV % Predicted %  98  112  108        has a past medical history of Abscess of right breast, C. difficile diarrhea (1/12), CAD (coronary artery disease), Cardiac tamponade, Cataract, CHF (congestive heart failure) ([place]), CHF (congestive heart failure) ([place]), Hypertensive retinopathy, Mixed connective tissue disease ([name]), Nephrotic syndrome, Pulmonary HTN ([place]), Pulmonary HTN ([place]), Raynaud's syndrome, Restrictive lung disease, Rheumatic fever, and Warfarin anticoagulation.   reports that she has never smoked. She  has never used smokeless tobacco.  Past Surgical History:  Procedure Laterality Date   BREAST CYST ASPIRATION  03/09/2012   CESAREAN SECTION     COLONOSCOPY     RIGHT HEART CATH N/A 08/10/2021   Procedure: RIGHT HEART CATH;  Surgeon: Larey Dresser, MD;  Location: [facility];  Service: Cardiovascular;  Laterality: N/A;   s/p mitral valvue replacement  05/13/2010   by Dr. Roxy Manns by minimally invasive window.    TUBAL LIGATION      Allergies  Allergen Reactions   Cephalexin Hives   Methotrexate Nausea And Vomiting    Immunization History  Administered Date(s) Administered   Influenza Split 09/22/2013, 07/11/2014   Influenza, Quadrivalent, Recombinant, Inj, Pf 07/20/2021   Influenza,inj,Quad PF,6+ Mos 05/31/2017, 07/15/2018, 07/29/2022   Influenza-Unspecified 07/25/2012, 08/22/2013, 07/29/2014, 07/17/2015, 07/06/2016   PFIZER(Purple Top)SARS-COV-2 Vaccination 06/23/2020   Pneumococcal Conjugate-13 01/04/2018   Pneumococcal Polysaccharide-23 07/23/2009, 05/10/2018   Tdap 02/10/2011   Unspecified SARS-COV-2 Vaccination 12/10/2019    Family History  Problem Relation Age of Onset   Emphysema Mother        smoker   COPD Mother    Heart disease Maternal Grandmother        side of family not given   Diabetes Maternal Grandmother    Hypertension Father    Osteoarthritis Father    Sleep apnea Father    Breast cancer Maternal Aunt      Current Outpatient Medications:    acetaminophen (TYLENOL) 325 MG tablet, Take 650 mg by mouth every 6 (six) hours as needed for moderate pain., Disp: , Rfl:    aspirin 81 MG EC tablet, Take 81 mg by mouth daily., Disp: , Rfl:    azaTHIOprine (IMURAN) 50 MG tablet, Take 50 mg by mouth daily. , Disp: , Rfl:    Belimumab (BENLYSTA) 200 MG/ML SOSY, Inject 1 Dose into the skin every Monday.,  Disp: , Rfl:    calcium carbonate (OS-CAL - DOSED IN MG OF ELEMENTAL CALCIUM) 1250 (500 Ca) MG tablet, Take 2 tablets by mouth daily., Disp: , Rfl:     cholecalciferol (VITAMIN D3) 25 MCG (1000 UNIT) tablet, Take 1,000 Units by mouth daily., Disp: , Rfl:    Cyanocobalamin (B-12) 1000 MCG TABS, Take 1,000 mcg by mouth daily., Disp: , Rfl:    Folic Acid (FOLATE PO), Take 666 mcg by mouth daily., Disp: , Rfl:    furosemide (LASIX) 20 MG tablet, Take 1 tablet (20 mg total) by mouth daily. Take '40mg'$  (2 tablets) by mouth alternating with '20mg'$  (1 tablet) by mouth daily., Disp: 135 tablet, Rfl: 5   Magnesium 250 MG TABS, Take 250 mg by mouth daily., Disp: , Rfl:    metoprolol succinate (TOPROL-XL) 25 MG 24 hr tablet, TAKE 1/2 TABLET DAILY, Disp: 45 tablet, Rfl: 3   Multiple Vitamin (MULTIVITAMIN) tablet, Take 2 tablets by mouth daily., Disp: , Rfl:    potassium chloride SA (KLOR-CON M20) 20 MEQ tablet, Take 1 tablet (20 mEq total) by mouth daily., Disp: 180 tablet, Rfl: 3   predniSONE (DELTASONE) 5 MG tablet, Take 5 mg by mouth daily., Disp: , Rfl:    SPIRIVA RESPIMAT 2.5 MCG/ACT AERS, INHALE 2 PUFFS BY MOUTH INTO THE LUNGS DAILY, Disp: 4 g, Rfl: 5   valACYclovir (VALTREX) 1000 MG tablet, Take 1,000 mg by mouth daily as needed (fever blisters)., Disp: , Rfl:    warfarin (COUMADIN) 5 MG tablet, Take as directed by the anticoagulation clinic., Disp: 135 tablet, Rfl: 3      Objective:   Vitals:   07/29/22 1018  BP: 110/68  Pulse: 90  Temp: 97.7 F (36.5 C)  TempSrc: Oral  SpO2: 100%  Weight: 187 lb 6.4 oz (85 kg)  Height: '5\' 5"'$  (1.651 m)    Estimated body mass index is 31.18 kg/m as calculated from the following:   Height as of this encounter: '5\' 5"'$  (1.651 m).   Weight as of this encounter: 187 lb 6.4 oz (85 kg).  '@WEIGHTCHANGE'$ @  Filed Weights   07/29/22 1018  Weight: 187 lb 6.4 oz (85 kg)     Physical Exam    General: No distress. Looks we Neuro: Alert and Oriented x 3. GCS 15. Speech normal Psych: Pleasant Resp:  Barrel Chest - no.  Wheeze - no, Crackles - no, No overt respiratory distress CVS: Normal heart sounds. Murmurs -  no Ext: Stigmata of Connective Tissue Disease - no HEENT: Normal upper airway. PEERL +. No post nasal drip        Assessment:       ICD-10-CM   1. Need for immunization against influenza  Z23 Flu Vaccine QUAD 29moIM (Fluarix, Fluzone & Alfiuria Quad PF)    2. Dyspnea on exertion  R06.09     3. Pulmonary emphysema, unspecified emphysema type ([place]  J43.9     4. History of obstructive sleep apnea  Z86.69     5. History of systemic lupus erythematosus (SLE) (HCC)  M32.9     6. Immunosuppression due to drug therapy ([place]  D84.821    Z79.899     7. Flu vaccine need  Z23          Plan:     Patient Instructions     Overall stable. Spriiva helping.     Plan - continue spiriva daily with albuterol as needed  -SLE management per Dr. AKathlene November(Imuran, Benlysta and prednisone] -  Pulmonary hypertension management per Dr. Loralie Champagne [ Lasix] -Sleep apnea management per Dr. Wilber Bihari at night) - flu shot 07/29/2022 - RSV vaccine recommended for those over 60 but because you are immunosuppressed I would recommend this vaccine for you but please do talk to your rheumatologist first  Follow-up - Return in 6-9 months; symptom score and simple walking desaturation test at follow-up       SIGNATURE    Dr. Brand Males, M.D., F.C.C.P,  Pulmonary and Critical Care Medicine Staff Physician, [place] Director - Interstitial Lung Disease  Program  [facility] [place] [facility] [place], [place], [address]  Pager: [phone], If no answer or between  15:00h - 7:00h: call [phone] Telephone: [phone]  10:44 AM 07/29/2022

## 2022-07-29 NOTE — Patient Instructions (Addendum)
ICD-10-CM   1. Need for immunization against influenza  Z23 Flu Vaccine QUAD 40moIM (Fluarix, Fluzone & Alfiuria Quad PF)    2. Dyspnea on exertion  R06.09     3. Pulmonary emphysema, unspecified emphysema type ([place]  J43.9     4. History of obstructive sleep apnea  Z86.69     5. History of systemic lupus erythematosus (SLE) (HCC)  M32.9     6. Immunosuppression due to drug therapy ([place]  D84.821    Z79.899     7. Flu vaccine need  Z23         Overall stable. Spriiva helping.     Plan - continue spiriva daily with albuterol as needed  -SLE management per Dr. AKathlene November(Imuran, Benlysta and prednisone] - Pulmonary hypertension management per Dr. DLoralie Champagne[ Lasix] -Sleep apnea management per Dr. DWilber Bihariat night) - flu shot 07/29/2022 - RSV vaccine recommended for those over 60 but because you are immunosuppressed I would recommend this vaccine for you but please do talk to your rheumatologist first  Follow-up - Return in 6-9 months; symptom score and simple walking desaturation test at follow-up

## 2022-08-24 NOTE — Progress Notes (Signed)
[facility] Note  09/07/2022     CHIEF COMPLAINT Patient presents for Retina Follow Up    HISTORY OF PRESENT ILLNESS: Katie [name] is a 56 y.o. female who presents to the clinic today for:   HPI     Retina Follow Up   Patient presents with  Other.  In both eyes.  This started years ago.  Severity is moderate.  Duration of 4 months.  Since onset it is stable.  I, the attending physician,  performed the HPI with the patient and updated documentation appropriately.        Comments   Patient states that she is seeing a haze in her vision. She is also complaining of increased floaters in the right eye. She has stopped Plaquenil over a year ago. She is not using any eye drops at this time.       Last edited by Bernarda Caffey, MD on 09/07/2022 12:39 PM.    Patient states the vision is still hazy at times, pt saw Dr. Lucianne Lei about 3-4 months ago  Referring physician: Lisabeth Pick, MD [address] [place],  [place] [address]  HISTORICAL INFORMATION:   Selected notes from the MEDICAL RECORD NUMBER Referred by Dr. Quentin Ore for concern of plaquenil toxicity LEE:  Ocular Hx- PMH-   CURRENT MEDICATIONS: No current outpatient medications on file. (Ophthalmic Drugs)   No current facility-administered medications for this visit. (Ophthalmic Drugs)   Current Outpatient Medications (Other)  Medication Sig   acetaminophen (TYLENOL) 325 MG tablet Take 650 mg by mouth every 6 (six) hours as needed for moderate pain.   aspirin 81 MG EC tablet Take 81 mg by mouth daily.   azaTHIOprine (IMURAN) 50 MG tablet Take 50 mg by mouth daily.    Belimumab (BENLYSTA) 200 MG/ML SOSY Inject 1 Dose into the skin every Monday.   calcium carbonate (OS-CAL - DOSED IN MG OF ELEMENTAL CALCIUM) 1250 (500 Ca) MG tablet Take 2 tablets by mouth daily.   cholecalciferol (VITAMIN D3) 25 MCG (1000 UNIT) tablet Take 1,000 Units by mouth daily.   Cyanocobalamin (B-12) 1000  MCG TABS Take 1,000 mcg by mouth daily.   Folic Acid (FOLATE PO) Take 666 mcg by mouth daily.   furosemide (LASIX) 20 MG tablet Take 1 tablet (20 mg total) by mouth daily. Take '40mg'$  (2 tablets) by mouth alternating with '20mg'$  (1 tablet) by mouth daily.   Magnesium 250 MG TABS Take 250 mg by mouth daily.   metoprolol succinate (TOPROL-XL) 25 MG 24 hr tablet TAKE 1/2 TABLET DAILY   Multiple Vitamin (MULTIVITAMIN) tablet Take 2 tablets by mouth daily.   potassium chloride SA (KLOR-CON M20) 20 MEQ tablet Take 1 tablet (20 mEq total) by mouth daily.   predniSONE (DELTASONE) 5 MG tablet Take 5 mg by mouth daily.   SPIRIVA RESPIMAT 2.5 MCG/ACT AERS INHALE 2 PUFFS BY MOUTH INTO THE LUNGS DAILY   valACYclovir (VALTREX) 1000 MG tablet Take 1,000 mg by mouth daily as needed (fever blisters).   warfarin (COUMADIN) 5 MG tablet Take as directed by the anticoagulation clinic.   No current facility-administered medications for this visit. (Other)   REVIEW OF SYSTEMS: ROS   Positive for: Musculoskeletal, Cardiovascular, Eyes Negative for: Constitutional, Gastrointestinal, Neurological, Skin, Genitourinary, HENT, Endocrine, Respiratory, Psychiatric, Allergic/Imm, Heme/Lymph Last edited by Annie Paras, COT on 09/07/2022 10:07 AM.     ALLERGIES Allergies  Allergen Reactions   Cephalexin Hives   Methotrexate Nausea  And Vomiting   PAST MEDICAL HISTORY Past Medical History:  Diagnosis Date   Abscess of right breast    sternal osteomyeliitis, pseudomonas   C. 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    Cataract    Mixed OU   CHF (congestive heart failure) (HCC)    secondary to mmoderate mitral stenosis and moderate mitral regurgitation in the setting of rheumatic vlave disease as well as severe pulmonary HTN likely due to a combination of MV  disease and pulmoanary arterial HTN in the setting of collagen vascular disease. Not valvuloplasty candidate due to MR. patient had MV replacement with mechanical MV in 8/11.   CHF (congestive heart failure) (HCC)    Echo post-op showed normally functioning mechanical MV, EF 60-65% with septal bounce, small mobile structure in the mid ventricle (? loose papillary muscle), RV-RA gradient 22 mmHg. Echo (10/11): EF 60-65%, normally functioning mechanical mitral vlavue.    Hypertensive retinopathy    OU   Mixed connective tissue disease ([place])    diagnosis 1990. 8/10 ANA positive, anti-dDNA positive, anti-SCL70 negative   Nephrotic syndrome    secondary to SLE   Pulmonary HTN ([place])    porbably secondary to combination of rheumatic MV disease and pulmonary arterial HYN form vollagen vascalr disease. CTA chest (7/10) with no evidence for pulmonary emboli or chronic pulmonary emboli. PA pressure 72/28 mean 50 with PVR 5.7 WU on initial RHC, PA pressure 45/21 mean 30 PVR 2.3 WU on f/u RHC on Revatio 80 mg three times a day (1/11).  After MV surgery, Revatio stopped.   Pulmonary HTN (HCC)    increased  dyspnea. Repeat RHC with mean RA 6, PA 42/20 mean 29, PVR 4.8 WU,  2.3. Revatio 20 mg  3x a day restarted. 3/11 6 min walk 422m 2/12 6 min walk 427 m.    Raynaud's syndrome    Restrictive lung disease    secondary to MCTD. PFTs 03/06/99 VC 47% DLCO 26%. PFTs 09/12/08 VC 43% DLCO 45%. PFTs 6/10 FVC 37%, FEV1 34%, ratio 69%, TLC 50%. using home O2 periodically   Rheumatic fever    Warfarin anticoagulation    for mechanical MV, goal INR 2.5-3.5   Past Surgical History:  Procedure Laterality Date   BREAST CYST ASPIRATION  03/09/2012   CESAREAN SECTION     COLONOSCOPY     RIGHT HEART CATH N/A 08/10/2021   Procedure: RIGHT HEART CATH;  Surgeon: MLarey Dresser MD;  Location: [facility];  Service: Cardiovascular;  Laterality: N/A;   s/p mitral valvue replacement  05/13/2010   by Dr. ORoxy Mannsby minimally  invasive window.    TUBAL LIGATION     FAMILY HISTORY Family History  Problem Relation Age of Onset   Emphysema Mother        smoker   COPD Mother    Heart disease Maternal Grandmother        side of family not given   Diabetes Maternal Grandmother    Hypertension Father    Osteoarthritis Father    Sleep apnea Father    Breast cancer Maternal Aunt    SOCIAL HISTORY Social History   Tobacco Use   Smoking status: Never   Smokeless tobacco: Never  Vaping Use   Vaping Use: Never used  Substance Use Topics   Alcohol use: No  Drug use: No       OPHTHALMIC EXAM:  Base Eye Exam     Visual Acuity (Snellen - Linear)       Right Left   Dist cc 20/25 -1 20/25 +1    Correction: Glasses         Tonometry (Tonopen, 10:14 AM)       Right Left   Pressure 18 18         Pupils       Dark Light Shape React APD   Right 3 2 Round Brisk None   Left 3 2 Round Brisk None         Visual Fields       Left Right    Full Full         Extraocular Movement       Right Left    Full, Ortho Full, Ortho         Neuro/Psych     Oriented x3: Yes   Mood/Affect: Normal         Dilation     Both eyes: 1.0% Mydriacyl, 2.5% Phenylephrine @ 10:10 AM           Slit Lamp and Fundus Exam     Slit Lamp Exam       Right Left   Lids/Lashes Dermatochalasis - upper lid, mild Meibomian gland dysfunction Dermatochalasis - upper lid, mild Meibomian gland dysfunction   Conjunctiva/Sclera trace melanosis Mild melanosis   Cornea Trace Punctate epithelial erosions, mild tear film debris Trace Punctate epithelial erosions, trace tear film debris   Anterior Chamber Deep and clear, narrow temporal angle, No cell or flare Deep and clear, narrow temporal angle, No cell or flare   Iris Round and dilated, PI 1130 Round and dilated, PI 100   Lens 2+ Nuclear sclerosis, 2-3+ Cortical cataract, 2-3+ Posterior subcapsular cataract 2+ Nuclear sclerosis, 2-3+ Cortical cataract, 1-2+  Posterior subcapsular cataract   Anterior Vitreous Vitreous syneresis Vitreous syneresis         Fundus Exam       Right Left   Disc Pink and Sharp, +cupping Pink and Sharp, +cupping, mild PPP   C/D Ratio 0.7 0.8   Macula Flat, Good foveal reflex, RPE mottling and clumping, +small central cyst, No heme Flat, Good foveal reflex, RPE mottling and clumping, trace cystic changes centrally, No heme   Vessels mild attenuation, mild tortuosity, mild copper wiring mild attenuation, mild tortuosity, mild copper wiring   Periphery Attached, No heme Attached, no heme           Refraction     Wearing Rx       Sphere Cylinder Axis Add   Right +1.00 +1.25 029 +1.75   Left +0.25 +1.50 137 +1.75           IMAGING AND PROCEDURES  Imaging and Procedures for '@TODAY'$ @  OCT, Retina - OU - Both Eyes       Right Eye Quality was good. Central Foveal Thickness: 270. Progression has been stable. Findings include no SRF, abnormal foveal contour, intraretinal fluid, vitreous traction (persistent VMT with central cyst; no ellipsoid loss).   Left Eye Quality was good. Central Foveal Thickness: 251. Progression has improved. Findings include no SRF, abnormal foveal contour, intraretinal fluid, vitreous traction (Mild interval improvement in VMT-- decreased central cyst and decrease in attachment area; no ellipsoid loss).   Notes *Images captured and stored on drive  Diagnosis / Impression:  OD: persistent VMT with central cyst;  no ellipsoid loss OS: Mild interval improvement in VMT-- decreased central cyst and decrease in attachment area; no ellipsoid loss No plaquenil toxicity OU  Clinical management:  See below  Abbreviations: NFP - Normal foveal profile. CME - cystoid macular edema. PED - pigment epithelial detachment. IRF - intraretinal fluid. SRF - subretinal fluid. EZ - ellipsoid zone. ERM - epiretinal membrane. ORA - outer retinal atrophy. ORT - outer retinal tubulation. SRHM -  subretinal hyper-reflective material            ASSESSMENT/PLAN:   ICD-10-CM   1. Vitreomacular adhesion of both eyes  H43.823 OCT, Retina - OU - Both Eyes    2. Long-term use of Plaquenil  Z79.899     3. Essential hypertension  I10     4. Anatomical narrow angle glaucoma  H40.039     5. Hypertensive retinopathy of both eyes  H35.033     6. Combined forms of age-related cataract of both eyes  H25.813     7. Bilateral dry eyes  H04.123      1,2. VMT OU  - OCT shows OD: persistent VMT with central cyst; no ellipsoid loss OS: Mild interval improvement in VMT-- decreased central cyst and decrease in attachment area; no ellipsoid loss  - pt denies significant vision changes or metamorphopsia  - BCVA 20/25 OU - stable  - discussed findings, prognosis, including possible development of macular hole  - continue amsler grid monitoring  - f/u in 4-6 months, sooner prn -- DFE, OCT  3. 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 Hypertensive retinopathy OU  - discussed importance of tight BP control  - continue to monitor  5. Narrow angle glaucoma OU  - IOP today: 18 OU  - PI's OU  - under the expert management of Dr. Lucianne Lei  6. Mixed cataracts OU  - The symptoms of cataract, surgical options, and treatments and risks were discussed with patient.   - under the expert management of Dr. Lucianne Lei (next appt. 10/2022)  - pt reports significant glare symptoms from [facility] -- recommend re-evaluation - clear  from a retina standpoint to proceed with cataract surgery when pt and surgeon are ready   7. Dry eyes OU - recommend artificial tears and lubricating ointment as needed   Ophthalmic Meds Ordered this visit:  No orders of the defined types were placed in this encounter.    Return for f/u 4-6 months, VMT OU, Dilated Exam, OCT.  There are no Patient Instructions on file for this visit.  This document serves as a record of services personally performed by Gardiner Sleeper, MD, PhD. It was created on their behalf by Renaldo Reel, [place] an ophthalmic technician. The creation of this record is the provider's dictation and/or activities during the visit.    Electronically signed by:  Renaldo Reel, COT 11.14.23 12:40 PM  This document serves as a record of services personally performed by Gardiner Sleeper, MD, PhD. It was  created on their behalf by San Jetty. Owens Shark, OA an ophthalmic technician. The creation of this record is the provider's dictation and/or activities during the visit.    Electronically signed by: San Jetty. Owens Shark, [place] 11.28.2023 12:40 PM  Gardiner Sleeper, M.D., Ph.D. Diseases & Surgery of the Retina and Vitreous [facility]  I have reviewed the above documentation for accuracy and completeness, and I agree with the above. Gardiner Sleeper, M.D., Ph.D. 09/07/22 12:42 PM   Abbreviations: M myopia (nearsighted); A astigmatism; H hyperopia (farsighted); P presbyopia; Mrx spectacle prescription;  CTL contact lenses; OD right eye; OS left eye; OU both eyes  XT exotropia; ET esotropia; PEK punctate epithelial keratitis; PEE punctate epithelial erosions; DES dry eye syndrome; MGD meibomian gland dysfunction; ATs artificial tears; PFAT's preservative free artificial tears; [place] nuclear sclerotic cataract; PSC posterior subcapsular cataract; ERM epi-retinal membrane; PVD posterior vitreous detachment; RD retinal detachment; DM diabetes mellitus; DR diabetic  retinopathy; NPDR non-proliferative diabetic retinopathy; PDR proliferative diabetic retinopathy; CSME clinically significant macular edema; DME diabetic macular edema; dbh dot blot hemorrhages; CWS cotton wool spot; POAG primary open angle glaucoma; C/D cup-to-disc ratio; HVF humphrey visual field; GVF goldmann visual field; OCT optical coherence tomography; IOP intraocular pressure; BRVO Branch retinal vein occlusion; CRVO central retinal vein occlusion; CRAO central retinal artery occlusion; BRAO branch retinal artery occlusion; RT retinal tear; SB scleral buckle; PPV pars plana vitrectomy; VH Vitreous hemorrhage; PRP panretinal laser photocoagulation; IVK intravitreal kenalog; VMT vitreomacular traction; MH Macular hole;  NVD neovascularization of the disc; NVE neovascularization elsewhere; AREDS age related eye disease study; ARMD age related macular degeneration; POAG primary open angle glaucoma; EBMD epithelial/anterior basement membrane dystrophy; ACIOL anterior chamber intraocular lens; IOL intraocular lens; PCIOL posterior chamber intraocular lens; Phaco/IOL phacoemulsification with intraocular lens placement; [place] photorefractive keratectomy; LASIK laser assisted in situ keratomileusis; HTN hypertension; DM diabetes mellitus; COPD chronic obstructive pulmonary disease

## 2022-08-27 ENCOUNTER — Ambulatory Visit
Admission: RE | Admit: 2022-08-27 | Discharge: 2022-08-27 | Disposition: A | Discharge status: Home / Self Care | Payer: BC Managed Care – PPO | Source: Ambulatory Visit | Attending: Internal Medicine | Admitting: Internal Medicine

## 2022-08-27 DIAGNOSIS — Z1231 Encounter for screening mammogram for malignant neoplasm of breast: Secondary | ICD-10-CM

## 2022-09-07 ENCOUNTER — Encounter (INDEPENDENT_AMBULATORY_CARE_PROVIDER_SITE_OTHER): Payer: Self-pay | Admitting: Ophthalmology

## 2022-09-07 ENCOUNTER — Ambulatory Visit (INDEPENDENT_AMBULATORY_CARE_PROVIDER_SITE_OTHER): Payer: BC Managed Care – PPO | Admitting: Ophthalmology

## 2022-09-07 DIAGNOSIS — Z79899 Other long term (current) drug therapy: Secondary | ICD-10-CM

## 2022-09-07 DIAGNOSIS — H25813 Combined forms of age-related cataract, bilateral: Secondary | ICD-10-CM

## 2022-09-07 DIAGNOSIS — H43823 Vitreomacular adhesion, bilateral: Secondary | ICD-10-CM

## 2022-09-07 DIAGNOSIS — H04123 Dry eye syndrome of bilateral lacrimal glands: Secondary | ICD-10-CM

## 2022-09-07 DIAGNOSIS — H35033 Hypertensive retinopathy, bilateral: Secondary | ICD-10-CM

## 2022-09-07 DIAGNOSIS — H40039 Anatomical narrow angle, unspecified eye: Secondary | ICD-10-CM | POA: Diagnosis not present

## 2022-09-07 DIAGNOSIS — I10 Essential (primary) hypertension: Secondary | ICD-10-CM

## 2022-11-04 ENCOUNTER — Telehealth: Payer: Self-pay | Admitting: *Deleted

## 2022-11-04 NOTE — Patient Instructions (Addendum)
Please continue using your CPAP regularly. While your insurance requires that you use CPAP at least 4 hours each night on 70% of the nights, I recommend, that you not skip any nights and use it throughout the night if you can. Getting used to CPAP and staying with the treatment long term does take time and patience and discipline. Untreated obstructive sleep apnea when it is moderate to severe can have an adverse impact on cardiovascular health and raise her risk for heart disease, arrhythmias, hypertension, congestive heart failure, stroke and diabetes. Untreated obstructive sleep apnea causes sleep disruption, nonrestorative sleep, and sleep deprivation. This can have an impact on your day to day functioning and cause daytime sleepiness and impairment of cognitive function, memory loss, mood disturbance, and problems focussing. Using CPAP regularly can improve these symptoms.   Follow up in 4-6 months

## 2022-11-04 NOTE — Progress Notes (Signed)
PATIENT: Katie [name] DOB: 1966/01/23  REASON FOR VISIT: follow up HISTORY FROM: patient  Chief Complaint  Patient presents with   Follow-up    Patient in room 1 alone, here for Cpap follow up. Patient has not being wearing cpap night, reports mask is small.      HISTORY OF PRESENT ILLNESS:  11/08/22 ALL:  Katie [name] returns for follow up for OSA on CPAP. She was last seen 04/2022 and struggling to meet compliance recommendations. Since, she reports doing fairly well until mid December. She obtained a new hairstyle that does not allow correct fit of mask/headgear. She has not used CPAP since. She has not noted any particular improvement in sleep quality but does feel that morning headaches are better when using CPAP consistently. She denies concerns with machine or supplies. She does wish to continue using CPAP.     04/28/2022 ALL: Katie [name] returns for follow up for OSA on CPAP. She was last seen 10/2021 and having difficulty with compliance. She reports that she has continued to use CPAP but is not consistent. She denies any concerns with her machine or mask. She is able to tolerate CPAP when she uses it. She states that if she is in pain, she does not use her machine. Some nights she doesn't go to bed until late so she will not use CPAP. She sleeps well when she is using CPAP. 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 Dizziness has resolved. She feels medication side effects were contributing.     10/13/2021 ALL: Katie [name] is a 57 y.o. female here today for follow up for OSA on CPAP. HST 02/2021 showed severe OSa with total AHI of 86.2/hr.  She was last seen by Dr Brett Fairy 06/02/2021. She continued to report concerns of insomnia and fatigue. Dr Dohmeier felt that fatigue was most likely related to autoimmune disease as compliance data showed excellent management of OSA/CSA with residual AHI of 0. Headaches and sharp pains had significantly improved on CPAP. Dizziness continued with  fatigue. She called 08/2021 reporting continued symptoms of dizziness, daytime sleepiness and fatigue. She reports these symptoms occur intermittently. She reports pain that prevents her from using CPAP. Tylenol usually helps pain. She also has insomnia and wakes multiple times at night to urinate. She is on daily prednisone. She is taking melatonin that does help her get to sleep. She does endorse more restful sleep and feels less fatigued when using CPAP. Most recently, she has not used CPAP consistently. Daily compliance at 53% and 4 hour at 30% over last 30 days. AHI was 0. She denies difficulty staying awake while driving. She drives a bus employment. She has a significant cardiac history of CHF, CAD and cardiac tamponade post MVR in 05/2010. She is followed for pulmonary HTN. No longer on O2.    HISTORY: (copied from Dr Dohmeier's previous note)  Interval History:  Katie [name] is a 57 y.o. female patient who had been seen at [facility] and is,now re- referred by Dr Shelia Media. Dizziness and sharp headaches. RV 06-02-2021: [name] was last seen on 4 19-22 and had a procedure with a diagnosis of primary pulmonary hypertension on 02-1621.  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  She had a pericardial effusion and has a history of coronary artery disease pulmonary hypertension.   Her home sleep test was performed on 02-25-2021 and showed a sleep time of 7 hours with 60% REM sleep, her AHI was astronomically high at 86 apneas and hypopneas  per hour.  The maximum oxygen saturation was 98% but the nadir was only 78% she had frequent short oxygen saturations but the total desaturation time was only 2.6 minutes.  She was prescribed a CPAP with 6 to 18 cm auto titration spectrum pressure 3 cm EPR and a mask of her choice.  The goal was to follow her today and see how she has been doing the 95th percentile pressure for this patient has been 7 cmH2O the residual AHI is only 3.7/h which is a good resolution so she she has been 90% compliant which gives 27 out of 30 days of use was 26 of those days over 4 hours and an average use at time on days used of 5.3 hours.  The air leak that she initially presented with [place] in June has much decreased to the months of July. She switched to a FFM, F 20 Resmed.    She continues to use oxygen at 2 L/min bled into her auto CPAP machine. She has not found to have any more headaches  (!)but remains as fatigued and sleepy as before. The apnea is not promoting this hypersomnia or fatigue any longer, last month download of CPAP showed a residual AHI of 0.0/h on the new FFM !  She is still having insomnia:  her son is 67 and in 7th grade.     REVIEW OF SYSTEMS: Out of a complete 14 system review of symptoms, the patient complains only of the following symptoms, chronic pain, insomnia, fatigue and all other reviewed systems are negative.  ESS: 5/24  ALLERGIES: Allergies  Allergen Reactions   Cephalexin Hives   Methotrexate Nausea And Vomiting    HOME MEDICATIONS: Outpatient Medications Prior to Visit  Medication Sig Dispense Refill   acetaminophen (TYLENOL) 325 MG tablet Take 650 mg by mouth every 6 (six) hours as needed for moderate pain.     aspirin 81 MG EC tablet Take 81 mg by mouth daily.     azaTHIOprine (IMURAN) 50 MG tablet Take 50 mg by mouth daily.      Belimumab (BENLYSTA) 200 MG/ML SOSY Inject 1 Dose into the skin every Monday.     calcium carbonate (OS-CAL - DOSED IN MG OF ELEMENTAL CALCIUM)  1250 (500 Ca) MG tablet Take 2 tablets by mouth daily.     cholecalciferol (VITAMIN D3) 25 MCG (1000 UNIT) tablet Take 1,000 Units by mouth daily.     Cyanocobalamin (B-12) 1000 MCG TABS Take 1,000 mcg by mouth daily.     Folic Acid (FOLATE PO) Take 666 mcg by mouth daily.     furosemide (LASIX) 20 MG tablet Take 1 tablet (20 mg total) by mouth daily. Take '40mg'$  (2 tablets) by mouth alternating with '20mg'$  (1 tablet) by mouth daily. 135 tablet 5   Magnesium 250 MG TABS Take 250 mg by mouth daily.     metoprolol succinate (TOPROL-XL) 25 MG 24 hr tablet TAKE 1/2 TABLET DAILY 45 tablet 3   Multiple Vitamin (MULTIVITAMIN) tablet Take 2  tablets by mouth daily.     potassium chloride SA (KLOR-CON M20) 20 MEQ tablet Take 1 tablet (20 mEq total) by mouth daily. 180 tablet 3   predniSONE (DELTASONE) 5 MG tablet Take 5 mg by mouth daily.     SPIRIVA RESPIMAT 2.5 MCG/ACT AERS INHALE 2 PUFFS BY MOUTH INTO THE LUNGS DAILY 4 g 5   valACYclovir (VALTREX) 1000 MG tablet Take 1,000 mg by mouth daily as needed (fever blisters).     warfarin (COUMADIN) 5 MG tablet Take as directed by the anticoagulation clinic. 135 tablet 3   No facility-administered medications prior to visit.    PAST MEDICAL HISTORY: Past Medical History:  Diagnosis Date   Abscess of right breast    sternal osteomyeliitis, pseudomonas   C. 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    Cataract    Mixed OU   CHF (congestive heart failure) (HCC)    secondary to mmoderate mitral stenosis and moderate mitral regurgitation in the setting of rheumatic vlave disease as well as severe pulmonary HTN likely due to a combination of MV disease and pulmoanary arterial HTN in the setting of collagen vascular disease. Not valvuloplasty candidate due to MR. patient had MV replacement with mechanical MV in 8/11.    CHF (congestive heart failure) (HCC)    Echo post-op showed normally functioning mechanical MV, EF 60-65% with septal bounce, small mobile structure in the mid ventricle (? loose papillary muscle), RV-RA gradient 22 mmHg. Echo (10/11): EF 60-65%, normally functioning mechanical mitral vlavue.    Hypertensive retinopathy    OU   Mixed connective tissue disease ([place])    diagnosis 1990. 8/10 ANA positive, anti-dDNA positive, anti-SCL70 negative   Nephrotic syndrome    secondary to SLE   Pulmonary HTN ([place])    porbably secondary to combination of rheumatic MV disease and pulmonary arterial HYN form vollagen vascalr disease. CTA chest (7/10) with no evidence for pulmonary emboli or chronic pulmonary emboli. PA pressure 72/28 mean 50 with PVR 5.7 WU on initial RHC, PA pressure 45/21 mean 30 PVR 2.3 WU on f/u RHC on Revatio 80 mg three times a day (1/11).  After MV surgery, Revatio stopped.   Pulmonary HTN (HCC)    increased  dyspnea. Repeat RHC with mean RA 6, PA 42/20 mean 29, PVR 4.8 WU,  2.3. Revatio 20 mg  3x a day restarted. 3/11 6 min walk 456m 2/12 6 min walk 427 m.    Raynaud's syndrome    Restrictive lung disease    secondary to MCTD. PFTs 03/06/99 VC 47% DLCO 26%. PFTs 09/12/08 VC 43% DLCO 45%. PFTs 6/10 FVC 37%, FEV1 34%, ratio 69%, TLC 50%. using home O2 periodically   Rheumatic fever    Warfarin anticoagulation    for mechanical MV, goal INR 2.5-3.5    PAST SURGICAL HISTORY: Past Surgical History:  Procedure Laterality Date   BREAST CYST ASPIRATION  03/09/2012   CESAREAN SECTION     COLONOSCOPY     RIGHT HEART CATH N/A 08/10/2021   Procedure: RIGHT HEART CATH;  Surgeon: MLarey Dresser MD;  [facility];  Service: Cardiovascular;  Laterality: N/A;   s/p mitral valvue replacement  05/13/2010   by Dr. ORoxy Mannsby minimally invasive window.    TUBAL LIGATION      FAMILY HISTORY: Family History  Problem Relation Age of Onset   Emphysema Mother        smoker   COPD  Mother    Heart disease Maternal Grandmother        side of family not given   Diabetes Maternal Grandmother    Hypertension Father    Osteoarthritis Father    Sleep apnea Father    Breast cancer Maternal Aunt     SOCIAL HISTORY: Social History   Socioeconomic History   Marital status: Single    Spouse name: Not on file   Number of children: Not on file   Years of education: Not on file   Highest education level: Not on file  Occupational History   Not on file  Tobacco Use   Smoking status: Never   Smokeless tobacco: Never  Vaping Use   Vaping Use: Never used  Substance and Sexual Activity   Alcohol use: No   Drug use: No   Sexual activity: Not Currently    Birth control/protection: Other-see comments    Comment: BTL  Other Topics Concern   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    Social Determinants of Health   Financial Resource Strain: Not on file  Food Insecurity: Not on file  Transportation Needs: Not on file  Physical Activity: Not on file  Stress: Not on file  Social Connections: Not on file  Intimate Partner Violence: Not on file     PHYSICAL EXAM  Vitals:   11/08/22 0956  BP: 112/75  Pulse: 88  Weight: 185 lb (83.9 kg)  Height: '5\' 5"'$  (1.651 m)    Body mass index is 30.79 kg/m.  Generalized: Well developed, in no acute distress  Cardiology: normal rate and rhythm, no murmur noted Respiratory: clear to auscultation bilaterally  Neurological examination  Mentation: Alert oriented to time, place, history taking. Follows all commands speech and language fluent Cranial nerve II-XII: Pupils were equal round reactive to light. Extraocular movements were full, visual field were full  Motor: The motor testing reveals 5 over 5 strength of all 4 extremities. Good symmetric motor tone is noted throughout.  Gait and station: Gait is normal.    DIAGNOSTIC DATA (LABS, IMAGING, TESTING) -  I reviewed patient records, labs, notes, testing and imaging myself where available.      No data to display           Lab Results  Component Value Date   WBC 3.5 (L) 03/18/2022   HGB 12.4 03/18/2022   HCT 37.5 03/18/2022   MCV 101.9 (H) 03/18/2022   PLT 215 03/18/2022      Component Value Date/Time   NA 140 03/18/2022 1142   K 3.6 03/18/2022 1142   CL 110 03/18/2022 1142   CO2 27 03/18/2022 1142   GLUCOSE 89 03/18/2022 1142   BUN 7 03/18/2022 1142   CREATININE 0.62 03/18/2022 1142   CALCIUM 8.3 (L) 03/18/2022 1142   PROT 6.5 07/01/2010 0908   ALBUMIN 3.2 (L) 07/01/2010 0908   AST 29 07/01/2010 0908   ALT 16 07/01/2010 0908   ALKPHOS 80 07/01/2010 0908   BILITOT 0.5 07/01/2010 0908   GFRNONAA >60 03/18/2022 1142   GFRAA >60 05/22/2019 1233   Lab Results  Component Value Date   CHOL 149 01/14/2016   HDL 38 (L) 01/14/2016   LDLCALC 92 01/14/2016   TRIG 93 01/14/2016   CHOLHDL 3.9 01/14/2016  Lab Results  Component Value Date   HGBA1C  05/11/2010    5.5 (NOTE)                                                                       According to the ADA Clinical Practice Recommendations for 2011, when HbA1c is used as a screening test:   >=6.5%   Diagnostic of Diabetes Mellitus           (if abnormal result  is confirmed)  5.7-6.4%   Increased risk of developing Diabetes Mellitus  References:Diagnosis and Classification of Diabetes Mellitus,Diabetes CBSW,9675,91(MBWGY 1):S62-S69 and Standards of Medical Care in         Diabetes - 2011,Diabetes Care,2011,34  (Suppl 1):S11-S61.   No results found for: "VITAMINB12" Lab Results  Component Value Date   TSH 4.08 07/01/2010     ASSESSMENT AND PLAN 57 y.o. year old female  has a past medical history of Abscess of right breast, C. difficile diarrhea (1/12), CAD (coronary artery disease), Cardiac tamponade, Cataract, CHF (congestive heart failure) ([place]), CHF (congestive heart failure) ([place]), Hypertensive retinopathy, Mixed  connective tissue disease ([place]), Nephrotic syndrome, Pulmonary HTN ([place]), Pulmonary HTN ([place]), Raynaud's syndrome, Restrictive lung disease, Rheumatic fever, and Warfarin anticoagulation. here with     ICD-10-CM   1. 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 Although she is unsure if she is sleeping any better, headaches are better when using on CPAP. ESS 5. We have had a lengthy conversation about her sleep study results and treatment options for sleep apnea. She does express a desire to continue using CPAP. Compliance report reveals sub optimal compliance. She was encouraged to continue using CPAP nightly and for greater than 4 hours each night. We have discussed benefits of using CPAP and association of sleep apnea in worsening pain, insomnia, nocturia, headaches, etc. Risks of untreated sleep apnea review and education materials provided. Healthy lifestyle habits encouraged. She will follow up in 6 months, sooner if needed. She verbalizes understanding and agreement with this plan.    Orders Placed This Encounter  Procedures   For home use only DME continuous positive airway pressure (CPAP)    Supplies    Order Specific Question:   Length of Need    Answer:   Lifetime    Order Specific Question:   Patient has OSA or probable OSA    Answer:   Yes    Order Specific Question:   Is the patient currently using CPAP in the home    Answer:   Yes    Order Specific Question:   Settings    Answer:   Other see comments    Order Specific Question:   CPAP supplies needed    Answer:   Mask, headgear, cushions, filters, heated tubing and water chamber     No orders of the defined types were placed in this encounter.     Debbora Presto, FNP-C 11/08/2022, 11:29 AM [facility] [address], [place], [place] [address] [phone]

## 2022-11-04 NOTE — Telephone Encounter (Signed)
Patient scheduled for cpap follow up on 11/08/22, I called patient and left detailed message on voicemail per DPR access to remind her to bring her cpap machine with her to her visit.

## 2022-11-08 ENCOUNTER — Encounter: Payer: Self-pay | Admitting: Family Medicine

## 2022-11-08 ENCOUNTER — Ambulatory Visit: Payer: BC Managed Care – PPO | Admitting: Family Medicine

## 2022-11-08 VITALS — BP 112/75 | HR 88 | Ht 65.0 in | Wt 185.0 lb

## 2022-11-08 DIAGNOSIS — G4733 Obstructive sleep apnea (adult) (pediatric): Secondary | ICD-10-CM

## 2022-11-15 ENCOUNTER — Ambulatory Visit (HOSPITAL_COMMUNITY)
Admission: RE | Admit: 2022-11-15 | Discharge: 2022-11-15 | Disposition: A | Discharge status: Home / Self Care | Payer: BC Managed Care – PPO | Source: Ambulatory Visit | Attending: Obstetrics and Gynecology | Admitting: Obstetrics and Gynecology

## 2022-11-15 ENCOUNTER — Encounter (HOSPITAL_COMMUNITY): Payer: Self-pay | Admitting: Cardiology

## 2022-11-15 ENCOUNTER — Ambulatory Visit (HOSPITAL_BASED_OUTPATIENT_CLINIC_OR_DEPARTMENT_OTHER)
Admission: RE | Admit: 2022-11-15 | Discharge: 2022-11-15 | Disposition: A | Discharge status: Home / Self Care | Payer: BC Managed Care – PPO | Source: Ambulatory Visit | Attending: Cardiology | Admitting: Cardiology

## 2022-11-15 ENCOUNTER — Other Ambulatory Visit: Payer: Self-pay

## 2022-11-15 VITALS — BP 120/74 | Wt 185.8 lb

## 2022-11-15 DIAGNOSIS — I052 Rheumatic mitral stenosis with insufficiency: Secondary | ICD-10-CM | POA: Diagnosis not present

## 2022-11-15 DIAGNOSIS — I5040 Unspecified combined systolic (congestive) and diastolic (congestive) heart failure: Secondary | ICD-10-CM | POA: Diagnosis not present

## 2022-11-15 DIAGNOSIS — J449 Chronic obstructive pulmonary disease, unspecified: Secondary | ICD-10-CM | POA: Insufficient documentation

## 2022-11-15 DIAGNOSIS — Z79624 Long term (current) use of inhibitors of nucleotide synthesis: Secondary | ICD-10-CM | POA: Insufficient documentation

## 2022-11-15 DIAGNOSIS — Z7982 Long term (current) use of aspirin: Secondary | ICD-10-CM | POA: Insufficient documentation

## 2022-11-15 DIAGNOSIS — I4510 Unspecified right bundle-branch block: Secondary | ICD-10-CM | POA: Diagnosis not present

## 2022-11-15 DIAGNOSIS — G4733 Obstructive sleep apnea (adult) (pediatric): Secondary | ICD-10-CM | POA: Diagnosis not present

## 2022-11-15 DIAGNOSIS — I7300 Raynaud's syndrome without gangrene: Secondary | ICD-10-CM | POA: Insufficient documentation

## 2022-11-15 DIAGNOSIS — J8410 Pulmonary fibrosis, unspecified: Secondary | ICD-10-CM | POA: Insufficient documentation

## 2022-11-15 DIAGNOSIS — K219 Gastro-esophageal reflux disease without esophagitis: Secondary | ICD-10-CM | POA: Insufficient documentation

## 2022-11-15 DIAGNOSIS — Z79631 Long term (current) use of antimetabolite agent: Secondary | ICD-10-CM | POA: Diagnosis not present

## 2022-11-15 DIAGNOSIS — Z7901 Long term (current) use of anticoagulants: Secondary | ICD-10-CM | POA: Diagnosis not present

## 2022-11-15 DIAGNOSIS — Z7952 Long term (current) use of systemic steroids: Secondary | ICD-10-CM | POA: Insufficient documentation

## 2022-11-15 DIAGNOSIS — I110 Hypertensive heart disease with heart failure: Secondary | ICD-10-CM | POA: Insufficient documentation

## 2022-11-15 DIAGNOSIS — I5022 Chronic systolic (congestive) heart failure: Secondary | ICD-10-CM

## 2022-11-15 DIAGNOSIS — Z952 Presence of prosthetic heart valve: Secondary | ICD-10-CM | POA: Insufficient documentation

## 2022-11-15 DIAGNOSIS — I272 Pulmonary hypertension, unspecified: Secondary | ICD-10-CM | POA: Diagnosis not present

## 2022-11-15 DIAGNOSIS — J439 Emphysema, unspecified: Secondary | ICD-10-CM | POA: Diagnosis not present

## 2022-11-15 DIAGNOSIS — Z79899 Other long term (current) drug therapy: Secondary | ICD-10-CM | POA: Insufficient documentation

## 2022-11-15 DIAGNOSIS — I059 Rheumatic mitral valve disease, unspecified: Secondary | ICD-10-CM | POA: Diagnosis not present

## 2022-11-15 LAB — CBC
HCT: 39.5 % (ref 36.0–46.0)
Hemoglobin: 13.2 g/dL (ref 12.0–15.0)
MCH: 33.2 pg (ref 26.0–34.0)
MCHC: 33.4 g/dL (ref 30.0–36.0)
MCV: 99.2 fL (ref 80.0–100.0)
Platelets: 195 10*3/uL (ref 150–400)
RBC: 3.98 MIL/uL (ref 3.87–5.11)
RDW: 13.7 % (ref 11.5–15.5)
WBC: 3.5 10*3/uL — ABNORMAL LOW (ref 4.0–10.5)
nRBC: 0 % (ref 0.0–0.2)

## 2022-11-15 LAB — ECHOCARDIOGRAM COMPLETE
Area-P 1/2: 4.09 cm2
Calc EF: 57.8 %
MV VTI: 2.8 cm2
S' Lateral: 3.3 cm
Single Plane A2C EF: 50.2 %
Single Plane A4C EF: 62.8 %

## 2022-11-15 LAB — BASIC METABOLIC PANEL
Anion gap: 7 (ref 5–15)
BUN: 8 mg/dL (ref 6–20)
CO2: 28 mmol/L (ref 22–32)
Calcium: 8.6 mg/dL — ABNORMAL LOW (ref 8.9–10.3)
Chloride: 105 mmol/L (ref 98–111)
Creatinine, Ser: 0.83 mg/dL (ref 0.44–1.00)
GFR, Estimated: >60 mL/min (ref >60)
Glucose, Bld: 86 mg/dL (ref 70–99)
Potassium: 3.7 mmol/L (ref 3.5–5.1)
Sodium: 140 mmol/L (ref 135–145)

## 2022-11-15 NOTE — Patient Instructions (Signed)
There has been no changes to your medications.  Labs done today, your results will be available in MyChart, we will contact you for abnormal readings.  Your physician recommends that you schedule a follow-up appointment in: 6 months. (August 2024) ** please call the office in May to arrange your follow up appointment. **  If you have any questions or concerns before your next appointment please send us a message through mychart or call our office at [phone].    TO LEAVE A MESSAGE FOR THE NURSE SELECT OPTION 2, PLEASE LEAVE A MESSAGE INCLUDING: YOUR NAME DATE OF BIRTH CALL BACK NUMBER REASON FOR CALL**this is important as we prioritize the call backs  YOU WILL RECEIVE A CALL BACK THE SAME DAY AS LONG AS YOU CALL BEFORE 4:00 PM  At the [facility], you and your health needs are our priority. As part of our continuing mission to provide you with exceptional heart care, we have created designated Provider Care Teams. These Care Teams include your primary Cardiologist (physician) and Advanced Practice Providers (APPs- Physician Assistants and Nurse Practitioners) who all work together to provide you with the care you need, when you need it.   You may see any of the following providers on your designated Care Team at your next follow up: Dr Daniel Bensimhon Dr Dalton McLean Dr. Aditya Sabharwal Amy Clegg, NP Brittainy Simmons, PA Jessica Milford,NP Lindsay Finch, PA Alma Diaz, NP Lauren Kemp, PharmD   Please be sure to bring in all your medications bottles to every appointment.    Thank you for choosing [facility]    

## 2022-11-15 NOTE — Progress Notes (Signed)
  Echocardiogram 2D Echocardiogram has been performed.  Katie [name] 11/15/2022, 10:45 AM

## 2022-11-16 NOTE — Progress Notes (Signed)
Patient ID: Katie [name], female   DOB: [birth year], 57 y.o.   MRN: [id number] PCP: Dr. Shelia Media Cardiology: Dr . Aundra Dubin  57 y.o. 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    Echo in 1/19 showed EF 60-65%, mechanical mitral valve with mean gradient 5 mmHg, and PA systolic pressure 33 mmHg with normal RV.  Echo in 8/20 showed EF 50-55%, normal RV, mechanical MV with mean gradient 6 mmHg, unable to estimate PA systolic pressure, normal RV.  Echo in 11/21 showed EF 60-65%, RV probably normal, PASP 25 mmHg, mechanical MV with mean gradient 5 mmHg.   RHC was done in 10/22 after stopping sildenafil. This showed normal filling pressures and normal PA pressure.   Echo today showed EF 55-60%, mildly decreased RV systolic function, PASP 33 mmHg, mechanical mitral valve with no MR and stable mean gradient 5 mmHg.   Patient is doing well.  Weight down 3 lbs.  No exertional dyspnea or chest pain.   No lightheadedness.  She continues to work.  She is unable to stop low dose prednisone due to worsening joint pain from MCTD.    ECG (personally reviewed): NSR, RBBB  Labs (8/11): HCT 27 => 30, creatinine 0.98, INR 4 Labs (9/11): K 4.6, creatinine 0.9, HCT 32.7, BNP 214=>146, LFTs normal, TSH normal Labs (1/12): HCT 37.9, K 3.5, creatinine 0.9 Labs (4/12): LDL 108, HDL 43, TSH normal, K 4.1, creatinine 0.8, HCT 37.9 Labs (1/13): K 3.8, creatinine 0.8 Labs (11/14): K 3.6, creatinine 0.71, LDL 94, HCT 38 Labs (12/15): Hgb 12.5 Labs (2/16): K 3.9, creatinine 0.84 Labs (8/16): K 3.9, creatinine 0.87, HCT 38.6 Labs (4/17): K 3.6, creatinine 0.88, LDL 92, HDL 38, hgb 11.7 Labs (10/17): K 3.6, creatinine 0.81, hgb 12.1 Labs (1/20): K 4.3, creatinine  0.79, LDL 107, hgb 12 Labs (6/21): creatinine 0.7 Labs (7/22): BNP 24, K 4.3, creatinine 0.78 Labs (9/22): BNP 103 Labs (10/22): K 4.4, creatinine 0.75, hgb 13.3 Labs (3/23): LDL 114, K 4, creatinine 0.7 Labs (6/23): K 3.6, creatinine 0.6, hgb 12.4  Allergies (verified):  1)  ! Methotrexate  Past Medical History: 1. Mixed connective tissue disease: diagnosis 1990. 8/10 ANA positive, anti-dsDNA positive, anti-SCL70 negative 2. 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     - PFT's 03/06/99  VC 47%  DLC0 26%    - PFT's 09/12/08  VC 43%  DLC0 45%    - PFT's 6/10 FVC 37%, FEV1 34%, ratio 69%, TLC 50%    - PFTs 11/15 FVC 39%, FEV1 35%, ratio 89%, TLC 63%, DLCO 48%    - CT chest in 2/22 showed emphysema but no ILD.  4. Nephrotic syndrome: secondary to MCTD 5. CHF: Secondary to moderate mitral stenosis and moderate mitral regurgitation in the setting of rheumatic mitral valve disease as well as severe pulmonary HTN likely due to a combination of MV disease and pulmonary arterial HTN in the setting of collagen vascular disease.   Not valvuloplasty candidate due to MR.  Patient had MV replacement with mechanical MV in 8/11.  Echo (8/11) post-op showed normally functioning mechanical MV, EF 60-65% with septal bounce, small mobile structure in the mid ventricle (? loose papillary muscle), RV-RA gradient 22 mmHg.  Echo (10/11): EF 60-65%, normally functioning mechanical mitral  valve.  Echo (7/13): EF 55-60%, mechanical mitral valve with mean gradient 4 mmHg, PA systolic pressure 29 mmHg. Echo (5/15) with EF 55-60%, mechanical mitral valve functioning normally, normal RV size and systolic function, PA systolic pressure 32 mmHg.  - Echo in 5/17 showed EF 60-65%, normal mechanical mitral valve, and PA systolic pressure 33 mmHg with normal RV.  - Echo (1/19): EF 60-65%, mild LVH, normal RV size and systolic function, mechanical mitral valve with mean gradient 6  mmHg, PASP 33 mmHg.  - Echo (8/20): EF 50-55%, normal RV, mechanical MV with mean gradient 6 mmHg, unable to estimate PA systolic pressure, normal RV.  No change from prior.  - Echo (11/21): EF 60-65%, RV probably normal, PASP 25 mmHg, mechanical MV with mean gradient 5 mmHg. - Echo (2/4): EF 55-60%, mildly decreased RV systolic function, PASP 33 mmHg, mechanical mitral valve with no MR and stable mean gradient 5 mmHg.  6.  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  CTA chest (7/10) with no evidence for pulmonary emboli or chronic pulmonary emboli.  PA pressure 72/28 mean 50 with PVR 5.7 WU on initial RHC, PA pressure 45/21 mean 30 PVR 2.3 WU on f/u RHC on Revatio 80 mg three times a day (1/11).  After MV surgery, Revatio stopped.  Increased dyspnea.  Repeat RHC with mean RA 6, PA 42/20 (mean 29), PVR 4.8 WU, CI 2.3.  Revatio 20 mg three times a day restarted.  Echo (4/65) with PA systolic pressure 29 mmHg. Echo (0/35) with PA systolic pressure 32 mmHg.  - RHC (10/22): mean RA 1, PA 28/8 mean 18, mean PCWP 7, CI 2.83, PVR 2 WU.  7.  Rheumatic fever 8.  LHC (9/10): no angiographic CAD.  9.  Warfarin anticoagulation for mechanical MV, goal INR 2.5-3.5 10.  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  11. 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 C difficile diarrhea 1/12 13. GERD 14. Holter (1/19): No significant arrhythmias.  15. COPD: CT chest in 2/22 showed emphysema but no ILD.  Suspect due to passive smoking.   Family History: Emphysema mother, smoker.  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   ROS: All systems reviewed and negative except as noted in HPI.   Current Outpatient Medications  Medication Sig Dispense Refill   acetaminophen (TYLENOL) 325 MG tablet Take  650 mg by mouth every 6 (six) hours as needed for moderate pain.     aspirin 81 MG EC tablet Take 81 mg by mouth daily.     azaTHIOprine (IMURAN) 50 MG tablet Take 50 mg by mouth daily.      Belimumab (BENLYSTA) 200 MG/ML SOSY Inject 1 Dose into the skin every Monday.     calcium carbonate (OS-CAL - DOSED IN MG OF ELEMENTAL CALCIUM) 1250 (500 Ca) MG tablet Take 2 tablets by mouth daily.     cholecalciferol (VITAMIN D3) 25 MCG (1000 UNIT) tablet Take 1,000 Units by mouth daily.     Cyanocobalamin (B-12) 1000 MCG TABS Take 1,000 mcg by mouth daily.     Folic Acid (FOLATE PO) Take 666 mcg by mouth daily.     furosemide (LASIX) 20 MG tablet Take 20 mg by mouth daily.     Magnesium 250 MG TABS Take 250 mg by mouth daily.     metoprolol succinate (TOPROL-XL) 25 MG 24  hr tablet TAKE 1/2 TABLET DAILY 45 tablet 3   Multiple Vitamin (MULTIVITAMIN) tablet Take 2 tablets by mouth daily.     potassium chloride SA (KLOR-CON M20) 20 MEQ tablet Take 1 tablet (20 mEq total) by mouth daily. 180 tablet 3   predniSONE (DELTASONE) 5 MG tablet Take 5 mg by mouth daily.     SPIRIVA RESPIMAT 2.5 MCG/ACT AERS INHALE 2 PUFFS BY MOUTH INTO THE LUNGS DAILY 4 g 5   valACYclovir (VALTREX) 1000 MG tablet Take 1,000 mg by mouth daily as needed (fever blisters).     warfarin (COUMADIN) 5 MG tablet Take as directed by the anticoagulation clinic. 135 tablet 3   No current facility-administered medications for this encounter.    BP 120/74   Wt 84.3 kg (185 lb 12.8 oz)   BMI 30.92 kg/m  General: NAD Neck: No JVD, no thyromegaly or thyroid nodule.  Lungs: Clear to auscultation bilaterally with normal respiratory effort. CV: Nondisplaced PMI.  Heart regular S1/S2 with mechanical S1, no S3/S4, 1/6 SEM RUSB.  No peripheral edema.  No carotid bruit.  Normal pedal pulses.  Abdomen: Soft, nontender, no hepatosplenomegaly, no distention.  Skin: Intact without lesions or rashes.  Neurologic: Alert and oriented x 3.  Psych: Normal  affect. Extremities: No clubbing or cyanosis.  HEENT: Normal.   Assessment/Plan:  1. Status post mechanical MVR:  Mitral valve replacement, stable from valve standpoint. Echo today showed mean gradient 5 mmHg (stable) without significant MR.  She is on coumadin goal INR 2.5 - 3.5 and ASA 81 mg daily.   - CBC today.  2.  Pulmonary HTN:  Pulmonary HTN is likely a mixed picture, from mitral valve disease as well as mixed connective tissue disease.  She had mild residual pulmonary HTN with moderately increased PVR even after mitral valve surgery on last RHC though this was improved compared to initial RHC.  The residual pulmonary HTN may be due to pulmonary vascular remodeling with chronic left atrial pressure elevation in the setting of mitral valve disease. Revatio was restarted at 20 mg tid.  Echo in 8/20 showed RV normal but unable to estimate PA systolic pressure.  Echo in 11/21 showed normal RV with PASP 25 mm Hg.  Symptomatically NYHA class I.  She is now off sildenafil.  RHC in 10/22 showed normal filling pressures and normal PA pressure (off sildenafil). Estimated PA systolic pressure on today's echo was 33 mmHg.  - She can stay off sildenafil.  - Continue lasix 20 mg daily.  BMET today.  - Continue CPAP for OSA.  3. Mixed connective tissue disorder: Patient has history of pulmonary fibrosis, but most recent CT in 2/22 showed emphysema and no significant fibrosis. Followed by rheumatology at [facility]. She has been unable to titrate completely off prednisone.  - On prednisone and azathioprine.  4. COPD: Emphysema on 2/22 CT chest.  Thought to be due to passive smoking.  - She will continue Spiriva, has had symptomatic improvement.  5. OSA: Continue CPAP.   Followup in 6 months with APP  Loralie Champagne 11/16/2022

## 2022-12-11 LAB — LAB REPORT - SCANNED
Creatinine, POC: 217.9 mg/dL
Microalb Creat Ratio: 131

## 2022-12-30 ENCOUNTER — Other Ambulatory Visit (HOSPITAL_COMMUNITY): Payer: Self-pay

## 2022-12-30 MED ORDER — METOPROLOL SUCCINATE ER 25 MG PO TB24: 12.5000 mg | ORAL_TABLET | Freq: Every day | ORAL | 3 refills | Status: DC

## 2023-01-24 ENCOUNTER — Other Ambulatory Visit (HOSPITAL_COMMUNITY): Payer: Self-pay | Admitting: Cardiology

## 2023-02-27 NOTE — Progress Notes (Unsigned)
OV 11/19/2020  Subjective:  Patient ID: Katie [name], female , DOB: 03/18/1966 , age 57 y.o. , MRN: [id number] , ADDRESS: [address] [place] [address] PCP Merri Brunette, MD Patient Care Team: Merri Brunette, MD as PCP - General (Internal Medicine)  This Provider for this visit: Treatment Team:  Attending Provider: Kalman Shan, MD    11/19/2020 -   Chief Complaint  Patient presents with   Consult    ILD consult. Chronic cough, SOB with exertion. Some wheezing     HPI Katie [name] 57 y.o. -presents for new consultation.  She is to see Dr. Sandrea Hughs but that was several years ago and therefore is a new consultation.  She has been referred by Dr. Marca Ancona based on chart review and her history.  She has mitral valve prolapse and she says on a 6-minute walk test she desaturated.  And given her lupus she has been referred here.  She gives a history of SLE since early 2s.  She is currently on immunosuppressed with prednisone, Imuran and also Benlysta infusions.  She says lupus initially involved her kidneys but does not at this point in time.  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  Also involves the heart and is the basis of mitral valve repair.  She says she also has Raynaud's.  Overall she feels lupus is well controlled.  She works as a Surveyor, mining  In terms of pulmonary in 2012 she had CT scan of the chest that reported emphysema but no obvious description of fibrosis but chest x-rays in 2015 reported fibrosis she does not have any CT scan of the chest at that time.  Her last PFTs were in 2015 that is reviewed below and visualized the image myself.  It shows restriction with a low DLCO consistent with someone with fibrosis.  In terms of her symptoms she has stable dyspnea on exertion.  She works as a Surveyor, mining.  She has chronic cough that for which she believes she will benefit from any  symptom relief.  She has had a Covid vaccine but the response to the vaccine is not known.  There is no Covid IgG check so far.  She is open to using oxygen but does not want to use it at work.  She denies any mold exposure or bird exposure or feather dust exposure or having down pillow   Echocardiogram #2021  -Normal functioning mitral valve that was repaired.  Normal ejection fraction normal right ventricular function   Sleep study 2017   -Apnea-hypopnea index 2.7  Results for Katie, [name] (MRN [id number]) as of 11/19/2020 10:00  Ref. Range 06/10/2009 20:08  Anti Nuclear Antibody (ANA) Latest Ref Range: NEGATIVE  POS (A)  ANA Pattern 1 Unknown SPECKLED  ANA Titer 1 Latest Ref Range: <1:40  1:320 (H)  ds DNA Ab Latest Ref Range: <5  69 (H)  Scleroderma (Scl-70) (ENA) Antibody, IgG Latest Ref Range: <1.0  0.5     Walk test   Simple office walk 185 feet x  3 laps goal with forehead probe 11/19/2020   O2 used ra  Number laps completed 3  Comments about pace x  Resting Pulse Ox/HR 100% and 87/min  Final Pulse Ox/HR 88% and 110/min  Desaturated </= 88% yes  Desaturated <= 3% points yes  Got Tachycardic >/= 90/min yes  Symptoms at end of test x  Miscellaneous comments x   '  CT Chest data    PFT   Results for Katie, [name] (MRN [id number]) as of 11/19/2020 10:00  Ref. Range 08/19/2020 10:33  Creatinine Latest Ref Range: 0.44 - 1.00 mg/dL 4.09  Results for Katie, [name] (MRN [id number]) as of 11/19/2020 10:00  Ref. Range 08/19/2020 10:33  Hemoglobin Latest Ref Range: 12.0 - 15.0 g/dL 95.6   ECHO 21/30/8657  IMPRESSIONS     1. Left ventricular ejection fraction, by estimation, is 60 to 65%. The  left ventricle has normal function. The left ventricle has no regional  wall motion abnormalities. Left ventricular diastolic function could not  be evaluated.   2. Right ventricular systolic function was not well visualized. The right  ventricular size is normal. There is  normal pulmonary artery systolic  pressure. The estimated right ventricular systolic pressure is 24.9 mmHg.   3. Left atrial size was mildly dilated.   4. The mitral valve has been repaired/replaced. No evidence of mitral  valve regurgitation. No evidence of mitral stenosis. The mean mitral valve  gradient is 5.2 mmHg with average heart rate of 89 bpm. There is a  mechanical valve present in the mitral  position. Procedure Date: 05/19/2010. Echo findings are consistent with  normal structure and function of the mitral valve prosthesis.   5. The aortic valve is normal in structure. Aortic valve regurgitation is  not visualized. No aortic stenosis is present.   6. The inferior vena cava is normal in size with greater than 50%  respiratory variability, suggesting right atrial pressure of 3 mmHg.   Comparison(s): No significant change from prior study. Prior images  reviewed side by side.   FINDINGS   Left Ventricle: Left ventricular ejection fraction, by estimation, is 60  to 65%. The left ventricle has normal function. The left ventricle has no  regional wall motion abnormalities. The left ventricular internal cavity  size was normal in size. There is   no left ventricular hypertrophy. Abnormal (paradoxical) septal motion  consistent with post-operative status. Left ventricular diastolic function  could not be evaluated due to mitral valve replacement. Left ventricular  diastolic function could not be  evaluated.   Right Ventricle: The right ventricular size is normal. No increase in  right ventricular wall thickness. Right ventricular systolic function was  not well visualized. There is normal pulmonary artery systolic pressure.  The tricuspid regurgitant velocity is   2.34 m/s, and with an assumed right atrial pressure of 3 mmHg, the  estimated right ventricular systolic pressure is 24.9 mmHg.   Left Atrium: Left atrial size was mildly dilated.   Right Atrium: Right atrial size  was normal in size.   Pericardium: There is no evidence of pericardial effusion.   Mitral Valve: The mitral valve has been repaired/replaced. No evidence of  mitral valve regurgitation. There is a mechanical valve present in the  mitral position. Procedure Date: 05/19/2010. Echo findings are consistent  with normal structure and function  of the mitral valve prosthesis. No evidence of mitral valve stenosis. The  mean mitral valve gradient is 5.2 mmHg with average heart rate of 89 bpm.   Tricuspid Valve: The tricuspid valve is normal in structure. Tricuspid  valve regurgitation is mild . No evidence of tricuspid stenosis.   Aortic Valve: The aortic valve is normal in structure. Aortic valve  regurgitation is not visualized. No aortic stenosis is present.   Pulmonic Valve: The pulmonic valve was normal in structure. Pulmonic  valve  regurgitation is not visualized. No evidence of pulmonic stenosis.   Aorta: The aortic root is normal in size and structure.   Venous: The inferior vena cava is normal in size with greater than 50%  respiratory variability, suggesting right atrial pressure of 3 mmHg.   IAS/Shunts: No atrial level shunt detected by color flow      OV 02/05/2021  Subjective:  Patient ID: Katie [name], female , DOB: 12/11/1965 , age 27 y.o. , MRN: [id number] , ADDRESS: [address] [place] [address] PCP Merri Brunette, MD Patient Care Team: Merri Brunette, MD as PCP - General (Internal Medicine)  This Provider for this visit: Treatment Team:  Attending Provider: Kalman Shan, MD    02/05/2021 -   Chief Complaint  Patient presents with   Follow-up    Doing ok, breathing is the same     HPI Katie [name] 57 y.o. - returns for follow-up.  She has lupus immunosuppression restricted PFTs exertional hypoxemia and exertional dyspnea.  High concern for interstitial lung disease but she had high-resolution CT chest the chest shows emphysema.  She had  pulmonary function test that still shows restriction and low DLCO but it is stable compared to 2015.  I interviewed her.  Her mom used to be a heavy smoker and she died in 03-25-2005.  During this time patient was heavily exposed to cigarette smoke at home.  The current CT is similar to Mar 26, 2011 many years ago which showed emphysema.  Patient is not using exertional oxygen because of poor customer service from [employer].  She does not want to be with that agency again.  She is willing to try other treatment for emphysema.  I had a to the ILD exposure questionnaire.  It appears that she has shortness of breath for 13 years gradually getting worse.  Symptom score is below.  She also has a chronic cough that is moderate in intensity and is been going on since 2008/03/25.  She does have lupus she does have Raynaud's.  She has fatigue she has arthralgia she is really not she does have some snoring.  Family history is positive for emphysema in her mother.  Organic and inorganic antigen exposure history is negative    CT Chest data 12/01/20   IMPRESSION: 1. Emphysema (ICD10-J43.9) with scattered pulmonary parenchymal scarring. No definitive evidence of superimposed interstitial lung disease. 2. Aortic atherosclerosis (ICD10-I70.0). Coronary artery calcification.     Electronically Signed   By: Leanna Battles M.D.   On: 12/01/2020 13:59     OV 06/11/2021  Subjective:  Patient ID: Katie [name], female , DOB: [birth year] , age 13 y.o. , MRN: [id number] , ADDRESS: [address] [place] [address] PCP Merri Brunette, MD Patient Care Team: Merri Brunette, MD as PCP - General (Internal Medicine)  This Provider for this visit: Treatment Team:  Attending Provider: Kalman Shan, MD    06/11/2021 -   Chief Complaint  Patient presents with   Follow-up    Pt states she has been doing okay since last visit. States that she still is becoming SOB with exertion.   HPI ZACHARY PELISSIER 57 y.o. -presents  for follow-up.  She was originally referred because of concern of lupus related interstitial lung disease because of the presence of lupus, immunosuppression and restrictive PFTs.  However admitted high-resolution CT chest earlier this year there is only emphysema.  She gave a strong history of passive smoking.  So when I saw  her in April 2022 started on Spiriva.  She is here for follow-up.  She tells me that the fatigue and cough is significantly improved with Spiriva but shortness of breath persists.  She says overall shortness of breath is stable but looking at the score shortness of breath might be worse.  Review of the chart indicate that in the interim Dr. [name] is the main increase Lasix for her.  There is no active smoking or passive smoking currently.  She has never been to pulmonary rehabilitation.  She is using CPAP since spring 2022.  She is also using oxygen at night.       OV 04/16/2022  Subjective:  Patient ID: Katie [name], female , DOB: Dec 23, 1965 , age 66 y.o. , MRN: [id number] , ADDRESS: [address] [place] [address] PCP Merri Brunette, MD Patient Care Team: Merri Brunette, MD as PCP - General (Internal Medicine)  This Provider for this visit: Treatment Team:  Attending Provider: Kalman Shan, MD    04/16/2022 -   Chief Complaint  Patient presents with   Follow-up    PFT performed today.  Pt states she has been doing okay since last visit and denies any complaints.    Follow-up   HPI EVOLEHT KEILLOR 57 y.o. -presents for routine follow-up.  Last seen in September 2022.  She says she cannot do pulmonary rehabilitation because of her job and logistics.  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  Overall she says she is doing fairly well.  Her main issues are that when she lies down on the right side she gets short of breath.  She says this been going on since the diagnose of pulmonary hypertension.  She also  explains that when she tries to crunch her abdomen and bent down to pick up something she gets more short of breath otherwise routine activities are fine.  Explained this is because of diaphragmatic compression.  There is no longer any passive smoking exposure.  In the past she had significant passive smoking exposure.  She had lung function today that shows severe obstruction with an FEV1 of 0.8 L / 31% and a ratio of 69.  Diffusion is also reduced at 10.7/51%.  Flow volume loop is consistent with obstruction.  Spiriva is working well for her.    OV 07/29/2022  Subjective:  Patient ID: Katie [name], female , DOB: 1966-05-31 , age 36 y.o. , MRN: [id number] , ADDRESS: [address] [place] [address] PCP Merri Brunette, MD Patient Care Team: Merri Brunette, MD as PCP - General (Internal Medicine)  This Provider for this visit: Treatment Team:  Attending Provider: Kalman Shan, MD   07/29/2022 -   Chief Complaint  Patient presents with   Follow-up    Pt states she has been doing okay since last visit and denies any complaints.     HPI ALEAHYA REITMEIER 57 y.o. -returns for follow-up.  Not seen her since July 2023.  She is stable.  The platypnea is resolved.  She believes that after she increased her CPAP the platypnea resolved.  She does not have much shortness of breath.  It is all stable.  She feels.  Was adequate enough she does not want increase the inhalers.  She is off her of audio based on cardiology notes.  She continues on Imuran and Benlysta.  She will have a flu shot today.      OV  02/28/2023  Subjective:  Patient ID: Katie [name], female , DOB: [birth year] , age 55 y.o. , MRN: [id number] , ADDRESS: [address] 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 Vickey Huger on CPAP at night since spring 2022   -Original sleep study 2017 -May 2022 sleep studyL: 02-25-2021 and showed a sleep time of 7 hours with 60% REM sleep, her AHI was astronomically high at 86 apneas and hypopneas per hour.  The maximum oxygen saturation was 98% but the nadir was only 78% she had frequent short oxygen saturations but the total desaturation time was only 2.6 minutes.  She was prescribed a CPAP with 6 to 18 cm au  -Last evaluation August 2022 by Dr. 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 Marca Ancona.  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 Marca Ancona  -CT angiogram chest July 2010 with no evidence of PE  -Status post mitral valve replacement in 2011  -On mechanical anticoagulation  -Unclear date of most recent cardiac cath but apparently improved since mitral valve replacement per Dr. [name] notes November 2021  -Most recent echo November 2021 with RV systolic pressure 25 and normal EF left ventricle.  Unable to estimate diastolic function  -Right heart cath October 2022 after stopping sildenafil showed normal PA pressures and filling pressures.  -Restricted PFTs on evaluation but CT scan of the chest 2022 with emphysema and strong history of passive smoking   -No ILD on CT chest`  -Started Spiriva April 2022  02/28/2023 -   Chief  Complaint  Patient presents with   Follow-up    F/up on cough.     HPI JAONNA SUDAR 57 y.o. -returns for follow-up.  She tells me that she is doing well but she is being bothered by cough.  I originally started Spiriva for her spastic smoking-related emphysema in April 2022 which was 2 years ago.  This brought the cough score down from 5 of 5 to 2 of 5 but now it is a 3 out of 5.  She tells me that the cough is there early in the morning and then intermittently she is coughing throughout the day.  It is chronic.  She is frustrated by this and wants better relief.  Today on exam there was some bilateral upper lobe crackles which I do not recall auscultating before.  For her lupus she continues on Imuran and prednisone but her [place] has been changed to sAPHENLO..  I noticed that she is not on Bactrim for PCP prophylaxis.  I encouraged her to talk about this with her rheumatologist.  Otherwise no medical issues.  No ER visits no surgeries no urgent care visits.  Her last imaging study was in 2022.  Explained potential  risk for lung cancer in past were smokers and also interstitial lung disease and lupus patients.  She is willing to get restaged.       SYMPTOM SCALE - emphysema, luspus 02/05/2021  06/11/2021 spiriva  O2 use ra ra  Shortness of Breath 0 -> 5 scale with 5 being worst (score 6 If unable to do)   At rest 0 0  Simple tasks - showers, clothes change, eating, shaving 0 1  Household (dishes, doing bed, laundry) 1 3  Shopping 3 4  Walking level at own pace 4 5  Walking up Stairs 5 5  Total (30-36) Dyspnea Score 13 18  How bad is your cough? 5 2  How bad is your fatigue 5 3  How bad is nausea 0 0  How bad is vomiting?  0 0  How bad is diarrhea? 0 0  How bad is anxiety? 3 2  How bad is depression 2 2      Walk test  Simple office walk 185 feet x  3 laps goal with forehead probe 11/19/2020  06/11/2021    O2 used ra ra   Number laps completed 3 3   Comments about pace x  avg   Resting Pulse Ox/HR 100% and 87/min 100% and 102   Final Pulse Ox/HR 88% and 110/min 95% and 130   Desaturated </= 88% yes no   Desaturated <= 3% points yes Yes 5 ponts   Got Tachycardic >/= 90/min yes Yes tachy even at rest   Symptoms at end of test x Moderate dyspnea   Miscellaneous comments x      PFT     Latest Ref Rng & Units 04/16/2022    9:00 AM 12/05/2020    8:58 AM 08/16/2014    9:02 AM 02/08/2014    9:57 AM  PFT Results  FVC-Pre L 1.22  1.17  1.16  1.14   FVC-Predicted Pre % 35  41  39  38   FVC-Post L  1.27  1.25  1.16   FVC-Predicted Post %  44  42  39   Pre FEV1/FVC % % 69  72  73  71   Post FEV1/FCV % %  69  76  78   FEV1-Pre L 0.84  0.84  0.85  0.82   FEV1-Predicted Pre % 31  37  35  34   FEV1-Post L  0.88  0.95  0.90   DLCO uncorrected ml/min/mmHg 10.69  9.57  11.83  11.54   DLCO UNC% % 51  45  48  47   DLCO corrected ml/min/mmHg 11.05      DLCO COR %Predicted % 53      DLVA Predicted % 107  102  104  99   TLC L  3.28  3.23  3.04   TLC % Predicted %  64  63  60   RV % Predicted %  98  112  108        has a past medical history of Abscess of right breast, C. difficile diarrhea (1/12), CAD (coronary artery disease), Cardiac tamponade, Cataract, CHF (congestive heart failure) (HCC), CHF (congestive heart failure) (HCC), Hypertensive retinopathy, Mixed connective tissue disease (HCC), Nephrotic syndrome, Pulmonary HTN (HCC), Pulmonary HTN (HCC), Raynaud's syndrome, Restrictive lung disease, Rheumatic fever, and Warfarin anticoagulation.   reports that she has never smoked. She has never used smokeless tobacco.  Past Surgical History:  Procedure Laterality Date   BREAST CYST ASPIRATION  03/09/2012  CESAREAN SECTION     COLONOSCOPY     RIGHT HEART CATH N/A 08/10/2021   Procedure: RIGHT HEART CATH;  Surgeon: Laurey Morale, MD;  Location: [facility] INVASIVE CV LAB;  Service: Cardiovascular;  Laterality: N/A;   s/p mitral valvue replacement  05/13/2010   by Dr.  [name] by minimally invasive window.    TUBAL LIGATION      Allergies  Allergen Reactions   Cephalexin Hives   Methotrexate Nausea And Vomiting    Immunization History  Administered Date(s) Administered   Influenza Split 09/22/2013, 07/11/2014   Influenza, Quadrivalent, Recombinant, Inj, Pf 07/20/2021   Influenza,inj,Quad PF,6+ Mos 05/31/2017, 07/15/2018, 07/29/2022   Influenza-Unspecified 07/25/2012, 08/22/2013, 07/29/2014, 07/17/2015, 07/06/2016   PFIZER(Purple Top)SARS-COV-2 Vaccination 06/23/2020   Pneumococcal Conjugate-13 01/04/2018   Pneumococcal Polysaccharide-23 07/23/2009, 05/10/2018   Tdap 02/10/2011   Unspecified SARS-COV-2 Vaccination 12/10/2019    Family History  Problem Relation Age of Onset   Emphysema Mother        smoker   COPD Mother    Heart disease Maternal Grandmother        side of family not given   Diabetes Maternal Grandmother    Hypertension Father    Osteoarthritis Father    Sleep apnea Father    Breast cancer Maternal Aunt      Current Outpatient Medications:    acetaminophen (TYLENOL) 325 MG tablet, Take 650 mg by mouth every 6 (six) hours as needed for moderate pain., Disp: , Rfl:    anifrolumab-fnia 300 mg in sodium chloride 0.9 %, Inject 300 mg into the vein once., Disp: , Rfl:    aspirin 81 MG EC tablet, Take 81 mg by mouth daily., Disp: , Rfl:    azaTHIOprine (IMURAN) 50 MG tablet, Take 50 mg by mouth daily. , Disp: , Rfl:    calcium carbonate (OS-CAL - DOSED IN MG OF ELEMENTAL CALCIUM) 1250 (500 Ca) MG tablet, Take 2 tablets by mouth daily., Disp: , Rfl:    cholecalciferol (VITAMIN D3) 25 MCG (1000 UNIT) tablet, Take 1,000 Units by mouth daily., Disp: , Rfl:    Cyanocobalamin (B-12) 1000 MCG TABS, Take 1,000 mcg by mouth daily., Disp: , Rfl:    Folic Acid (FOLATE PO), Take 666 mcg by mouth daily., Disp: , Rfl:    furosemide (LASIX) 20 MG tablet, Take 20 mg by mouth daily., Disp: , Rfl:    Magnesium 250 MG TABS, Take 250 mg by mouth  daily., Disp: , Rfl:    metoprolol succinate (TOPROL-XL) 25 MG 24 hr tablet, Take 0.5 tablets (12.5 mg total) by mouth daily., Disp: 45 tablet, Rfl: 3   Multiple Vitamin (MULTIVITAMIN) tablet, Take 2 tablets by mouth daily., Disp: , Rfl:    potassium chloride SA (KLOR-CON M20) 20 MEQ tablet, Take 1 tablet (20 mEq total) by mouth daily., Disp: 180 tablet, Rfl: 3   predniSONE (DELTASONE) 5 MG tablet, Take 5 mg by mouth daily., Disp: , Rfl:    SPIRIVA RESPIMAT 2.5 MCG/ACT AERS, INHALE 2 PUFFS BY MOUTH INTO THE LUNGS DAILY, Disp: 4 g, Rfl: 5   valACYclovir (VALTREX) 1000 MG tablet, Take 1,000 mg by mouth daily as needed (fever blisters)., Disp: , Rfl:    warfarin (COUMADIN) 5 MG tablet, Take as directed by the anticoagulation clinic., Disp: 135 tablet, Rfl: 3   Belimumab (BENLYSTA) 200 MG/ML SOSY, Inject 1 Dose into the skin every Monday. (Patient not taking: Reported on 02/28/2023), Disp: , Rfl:       Objective:  Vitals:   02/28/23 0944  BP: 90/70  Weight: 186 lb (84.4 kg)  Height: 5\' 5"  (1.651 m)    Estimated body mass index is 30.95 kg/m as calculated from the following:   Height as of this encounter: 5\' 5"  (1.651 m).   Weight as of this encounter: 186 lb (84.4 kg).  @WEIGHTCHANGE @  [employer]   02/28/23 0944  Weight: 186 lb (84.4 kg)     Physical Exam  General: No distress. Looks well Neuro: Alert and Oriented x 3. GCS 15. Speech normal Psych: Pleasant Resp:  Barrel Chest - no.  Wheeze - no, Crackles - YES BILATERAL UPPER LOBE, No overt respiratory distress CVS: Normal heart sounds. Murmurs - no Ext: Stigmata of Connective Tissue Disease - no HEENT: Normal upper airway. PEERL +. No post nasal drip        Assessment:       ICD-10-CM   1. Pulmonary emphysema, unspecified emphysema type (HCC)  J43.9     2. At risk from passive smoking  Z91.89     3. Chronic cough  R05.3     4. Immunosuppression due to drug therapy (HCC)  D84.821    Z79.899     5. Personal  history of systemic lupus erythematosus (SLE) (HCC)  M32.9     6. History of obstructive sleep apnea  Z86.69          Plan:     Patient Instructions     ICD-10-CM   1. Pulmonary emphysema, unspecified emphysema type (HCC)  J43.9     2. At risk from passive smoking  Z91.89     3. Chronic cough  R05.3     4. Immunosuppression due to drug therapy (HCC)  D84.821    Z79.899     5. Personal history of systemic lupus erythematosus (SLE) (HCC)  M32.9     6. History of obstructive sleep apnea  Z86.69        Overall stable. Spriiva helping the cough partially but he still have significant residual cough.  I also was able to auscultate crackles in the upper lobes in the front.  Otherwise overall stable.  Noted the change in the lupus medication.  Due to history of passive smoking and at risk for lung cancer and interstitial lung disease we will restage your lungs   Plan -Change Spiriva to Stiolto scheduled -let us see if this helps cough -Also prescribed albuterol as needed - --Do spirometry and DLCO in 3 months - Do high-resolution CT chest in 3 months.  -SLE management per Dr. Deanne Coffer (Imuran, SAPHNELO and prednisone] - Pulmonary hypertension management per Dr. Marca Ancona [ Lasix]   Follow-up - Return in 3  months; symptom score and simple walking desaturation test at follow-up      SIGNATURE    Dr. Kalman Shan, M.D., F.C.C.P,  Pulmonary and Critical Care Medicine Staff Physician, [facility] Director - Interstitial Lung Disease  Program  [facility] at [facility] [place], [place], [address]  Pager: [phone], If no answer or between  15:00h - 7:00h: call [phone] Telephone: [phone]  10:14 AM 02/28/2023

## 2023-02-27 NOTE — Patient Instructions (Signed)
ICD-10-CM   1. Need for immunization against influenza  Z23 Flu Vaccine QUAD 6mo+IM (Fluarix, Fluzone & Alfiuria Quad PF)    2. Dyspnea on exertion  R06.09     3. Pulmonary emphysema, unspecified emphysema type (HCC)  J43.9     4. History of obstructive sleep apnea  Z86.69     5. History of systemic lupus erythematosus (SLE) (HCC)  M32.9     6. Immunosuppression due to drug therapy (HCC)  D84.821    Z79.899     7. Flu vaccine need  Z23         Overall stable. Spriiva helping.     Plan - continue spiriva daily with albuterol as needed  -SLE management per Dr. Aryal (Imuran, Benlysta and prednisone] - Pulmonary hypertension management per Dr. Dalton McLean [ Lasix] -Sleep apnea management per Dr. Dohmeier [CPAP at night) - flu shot 07/29/2022 - RSV vaccine recommended for those over 60 but because you are immunosuppressed I would recommend this vaccine for you but please do talk to your rheumatologist first  Follow-up - Return in 6-9 months; symptom score and simple walking desaturation test at follow-up    

## 2023-02-28 ENCOUNTER — Encounter: Payer: Self-pay | Admitting: Internal Medicine

## 2023-02-28 ENCOUNTER — Ambulatory Visit: Payer: BC Managed Care – PPO | Admitting: Internal Medicine

## 2023-02-28 VITALS — BP 90/70 | Ht 65.0 in | Wt 186.0 lb

## 2023-02-28 DIAGNOSIS — J439 Emphysema, unspecified: Secondary | ICD-10-CM | POA: Diagnosis not present

## 2023-02-28 DIAGNOSIS — D84821 Immunodeficiency due to drugs: Secondary | ICD-10-CM | POA: Diagnosis not present

## 2023-02-28 DIAGNOSIS — M329 Systemic lupus erythematosus, unspecified: Secondary | ICD-10-CM

## 2023-02-28 DIAGNOSIS — Z79899 Other long term (current) drug therapy: Secondary | ICD-10-CM

## 2023-02-28 DIAGNOSIS — R053 Chronic cough: Secondary | ICD-10-CM

## 2023-02-28 DIAGNOSIS — Z9189 Other specified personal risk factors, not elsewhere classified: Secondary | ICD-10-CM | POA: Diagnosis not present

## 2023-02-28 DIAGNOSIS — Z8669 Personal history of other diseases of the nervous system and sense organs: Secondary | ICD-10-CM

## 2023-02-28 MED ORDER — STIOLTO RESPIMAT 2.5-2.5 MCG/ACT IN AERS: 2.0000 | INHALATION_SPRAY | Freq: Every day | RESPIRATORY_TRACT | 0 refills | Status: DC

## 2023-02-28 NOTE — Progress Notes (Signed)
[facility] - Clinic Note  03/08/2023     CHIEF COMPLAINT Patient presents for Retina Follow Up    HISTORY OF PRESENT ILLNESS: Katie [name] is a 57 y.o. female who presents to the clinic today for:   HPI     Retina Follow Up   Patient presents with  Other.  In both eyes.  This started 6 months ago.  I, the attending physician,  performed the HPI with the patient and updated documentation appropriately.        Comments   Patient here for 6 months retina follow up for VMT OU. Patient states vision doing poor, fair. Needs cataracts off . They are bothersome. On IV med once a month. Wearing old glasses. Current glasses scratched.      Last edited by Rennis Chris, MD on 03/08/2023 12:24 PM.    Patient is having a cataract eval with Dr. 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 school bus driver)  Referring physician: Diona Foley, MD [address] [place],  [place] [address]  HISTORICAL INFORMATION:   Selected notes from the MEDICAL RECORD NUMBER Referred by Dr. Baker Pierini for concern of plaquenil toxicity LEE:  Ocular Hx- PMH-   CURRENT MEDICATIONS: No current outpatient medications on file. (Ophthalmic Drugs)   No current facility-administered medications for this visit. (Ophthalmic Drugs)   Current Outpatient Medications (Other)  Medication Sig   acetaminophen (TYLENOL) 325 MG tablet Take 650 mg by mouth every 6 (six) hours as needed for moderate pain.   Anifrolumab-fnia (SAPHNELO IV) Inject into the vein.   anifrolumab-fnia 300 mg in sodium chloride 0.9 % Inject 300 mg into the vein once.   aspirin 81 MG EC tablet Take 81 mg by mouth daily.   azaTHIOprine (IMURAN) 50 MG tablet Take 50 mg by mouth daily.    calcium carbonate (OS-CAL - DOSED IN MG OF ELEMENTAL CALCIUM) 1250 (500 Ca) MG tablet Take 2 tablets by mouth daily.   cholecalciferol (VITAMIN D3) 25 MCG (1000 UNIT) tablet Take 1,000 Units by mouth daily.    Cyanocobalamin (B-12) 1000 MCG TABS Take 1,000 mcg by mouth daily.   Folic Acid (FOLATE PO) Take 666 mcg by mouth daily.   furosemide (LASIX) 20 MG tablet Take 20 mg by mouth daily.   Magnesium 250 MG TABS Take 250 mg by mouth daily.   metoprolol succinate (TOPROL-XL) 25 MG 24 hr tablet Take 0.5 tablets (12.5 mg total) by mouth daily.   Multiple Vitamin (MULTIVITAMIN) tablet Take 2 tablets by mouth daily.   potassium chloride SA (KLOR-CON M20) 20 MEQ tablet Take 1 tablet (20 mEq total) by mouth daily.   predniSONE (DELTASONE) 5 MG tablet Take 5 mg by mouth daily.   SPIRIVA RESPIMAT 2.5 MCG/ACT AERS INHALE 2 PUFFS BY MOUTH INTO THE LUNGS DAILY   Tiotropium Bromide-Olodaterol (STIOLTO RESPIMAT) 2.5-2.5 MCG/ACT AERS Inhale 2 puffs into the lungs daily.   valACYclovir (VALTREX) 1000 MG tablet Take 1,000 mg by mouth daily as needed (fever blisters).   warfarin (COUMADIN) 5 MG tablet Take as directed by the anticoagulation clinic.   Belimumab (BENLYSTA) 200 MG/ML SOSY Inject 1 Dose into the skin every Monday. (Patient not taking: Reported on 02/28/2023)   No current facility-administered medications for this visit. (Other)   REVIEW OF SYSTEMS: ROS   Positive for: Musculoskeletal, Cardiovascular, Eyes Negative for: Constitutional, Gastrointestinal, Neurological, Skin, Genitourinary, HENT, Endocrine, Respiratory, Psychiatric, Allergic/Imm, Heme/Lymph Last edited by Betsey Holiday  S, COA on 03/08/2023  9:54 AM.     ALLERGIES Allergies  Allergen Reactions   Cephalexin Hives   Methotrexate Nausea And Vomiting   PAST MEDICAL HISTORY Past Medical History:  Diagnosis Date   Abscess of right breast    sternal osteomyeliitis, pseudomonas   C. 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    Cataract    Mixed OU   CHF (congestive heart failure) (HCC)     secondary to mmoderate mitral stenosis and moderate mitral regurgitation in the setting of rheumatic vlave disease as well as severe pulmonary HTN likely due to a combination of MV disease and pulmoanary arterial HTN in the setting of collagen vascular disease. Not valvuloplasty candidate due to MR. patient had MV replacement with mechanical MV in 8/11.   CHF (congestive heart failure) (HCC)    Echo post-op showed normally functioning mechanical MV, EF 60-65% with septal bounce, small mobile structure in the mid ventricle (? loose papillary muscle), RV-RA gradient 22 mmHg. Echo (10/11): EF 60-65%, normally functioning mechanical mitral vlavue.    Hypertensive retinopathy    OU   Mixed connective tissue disease (HCC)    diagnosis 1990. 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 CTA chest (7/10) with no evidence for pulmonary emboli or chronic pulmonary emboli. PA pressure 72/28 mean 50 with PVR 5.7 WU on initial RHC, PA pressure 45/21 mean 30 PVR 2.3 WU on f/u RHC on Revatio 80 mg three times a day (1/11).  After MV surgery, Revatio stopped.   Pulmonary HTN (HCC)    increased  dyspnea. Repeat RHC with mean RA 6, PA 42/20 mean 29, PVR 4.8 WU,  2.3. Revatio 20 mg  3x a day restarted. 3/11 6 min walk 450m. 2/12 6 min walk 427 m.    Raynaud's syndrome    Restrictive lung disease    secondary to MCTD. PFTs 03/06/99 VC 47% DLCO 26%. PFTs 09/12/08 VC 43% DLCO 45%. PFTs 6/10 FVC 37%, FEV1 34%, ratio 69%, TLC 50%. using home O2 periodically   Rheumatic fever    Warfarin anticoagulation    for mechanical MV, goal INR 2.5-3.5   Past Surgical History:  Procedure Laterality Date   BREAST CYST ASPIRATION  03/09/2012   CESAREAN SECTION     COLONOSCOPY     RIGHT HEART CATH N/A 08/10/2021   Procedure: RIGHT HEART  CATH;  Surgeon: Laurey Morale, MD;  Location: [facility] INVASIVE CV LAB;  Service: Cardiovascular;  Laterality: N/A;   s/p mitral valvue replacement  05/13/2010   by Dr. [name] by minimally invasive window.    TUBAL LIGATION     FAMILY HISTORY Family History  Problem Relation Age of Onset   Emphysema Mother        smoker   COPD Mother    Heart disease Maternal Grandmother        side of family not given   Diabetes Maternal Grandmother    Hypertension Father    Osteoarthritis Father    Sleep apnea Father    Breast cancer Maternal Aunt    SOCIAL HISTORY Social History   Tobacco Use   Smoking status: Never  Smokeless tobacco: Never  Vaping Use   Vaping Use: Never used  Substance Use Topics   Alcohol use: No   Drug use: No       OPHTHALMIC EXAM:  Base Eye Exam     Visual Acuity (Snellen - Linear)       Right Left   Dist cc 20/40 -2 20/25 -2   Dist ph cc 20/30 -2     Correction: Glasses         Tonometry (Tonopen, 9:51 AM)       Right Left   Pressure 18 20         Pupils       Dark Light Shape React APD   Right 3 2 Round Brisk None   Left 3 2 Round Brisk None         Visual Fields (Counting fingers)       Left Right    Full Full         Extraocular Movement       Right Left    Full, Ortho Full, Ortho         Neuro/Psych     Oriented x3: Yes   Mood/Affect: Normal         Dilation     Both eyes: 1.0% Mydriacyl, 2.5% Phenylephrine @ 9:50 AM           Slit Lamp and Fundus Exam     Slit Lamp Exam       Right Left   Lids/Lashes Dermatochalasis - upper lid, mild Meibomian gland dysfunction Dermatochalasis - upper lid, mild Meibomian gland dysfunction   Conjunctiva/Sclera trace melanosis Mild melanosis   Cornea Trace tear film debris, trace PEE Trace Punctate epithelial erosions, mild tear film debris   Anterior Chamber Deep and clear, narrow temporal angle, No cell or flare Deep and clear, narrow temporal angle, No cell or flare    Iris Round and dilated, PI 1130 Round and dilated, PI 100   Lens 2+ Nuclear sclerosis, 2-3+ Cortical cataract, 2-3+ Posterior subcapsular cataract 2+ Nuclear sclerosis, 2-3+ Cortical cataract, 2+ Posterior subcapsular cataract   Anterior Vitreous Vitreous syneresis Vitreous syneresis         Fundus Exam       Right Left   Disc Pink and Sharp, +cupping Pink and Sharp, +cupping, mild PPP   C/D Ratio 0.7 0.8   Macula Flat, blunted foveal reflex, RPE mottling and clumping, +small central cyst, No heme Flat, blunted foveal reflex, RPE mottling and clumping, trace cystic changes centrally, No heme   Vessels attenuated, mild tortuosity attenuated, mild tortuosity   Periphery Attached, No heme Attached, no heme           Refraction     Wearing Rx       Sphere Cylinder Axis Add   Right +1.00 +1.25 029 +1.75   Left +0.25 +1.50 137 +1.75           IMAGING AND PROCEDURES  Imaging and Procedures for @TODAY @  OCT, Retina - OU - Both Eyes       Right Eye Quality was good. Central Foveal Thickness: 271. Progression has been stable. Findings include no SRF, abnormal foveal contour, intraretinal fluid, vitreous traction (persistent VMT with central cyst; no ellipsoid loss -- stable from prior).   Left Eye Quality was good. Central Foveal Thickness: 254. Progression has been stable. Findings include no SRF, abnormal foveal contour, intraretinal fluid, vitreous traction (Persistent VMT with small central cyst; no ellipsoid  loss -- stable from prior).   Notes *Images captured and stored on drive  Diagnosis / Impression:  OD: persistent VMT with central cyst; no ellipsoid loss -- stable from prior OS: Persistent VMT with small central cyst; no ellipsoid loss -- stable from prior  Clinical management:  See below  Abbreviations: NFP - Normal foveal profile. CME - cystoid macular edema. PED - pigment epithelial detachment. IRF - intraretinal fluid. SRF - subretinal fluid. EZ -  ellipsoid zone. ERM - epiretinal membrane. ORA - outer retinal atrophy. ORT - outer retinal tubulation. SRHM - subretinal hyper-reflective material            ASSESSMENT/PLAN:   ICD-10-CM   1. Vitreomacular adhesion of both eyes  H43.823 OCT, Retina - OU - Both Eyes    2. Long-term use of Plaquenil  Z79.899     3. Essential hypertension  I10     4. Anatomical narrow angle glaucoma  H40.039     5. Hypertensive retinopathy of both eyes  H35.033     6. Combined forms of age-related cataract of both eyes  H25.813     7. Bilateral dry eyes  H04.123      1,2. VMT OU  - OCT shows OD: persistent VMT with central cyst; no ellipsoid loss -- stable from prior; OS: Persistent VMT with small central cyst; no ellipsoid loss -- stable from prior  - pt denies significant vision changes or metamorphopsia  - BCVA 20/25 OU - stable  - discussed findings, prognosis, including possible development of macular hole  - continue amsler grid monitoring  - f/u in 6 months, sooner prn -- DFE, OCT  3. 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 Hypertensive retinopathy OU  - discussed importance of tight BP control  - continue to monitor  5. Narrow angle glaucoma OU  - IOP today: 18, 20  - patent PI's OU  - under the expert management of Dr. Zenaida Niece  6. Mixed cataracts OU  - The symptoms of cataract, surgical options, and treatments and risks were discussed with patient. - under the expert management of Dr. Zenaida Niece (next  appt. July 2024)  - pt reports significant glare symptoms from [facility] -- pt scheduled to f/u w/ Dr. 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 Dry eyes OU - recommend artificial tears and lubricating ointment as needed   Ophthalmic Meds Ordered this visit:  No orders of the defined types were placed in this encounter.    Return in about 6 months (around 09/08/2023) for VMT OU, DFE, OCT.  There are no Patient Instructions on file for this visit.  This document serves as a record of services personally performed by Karie Chimera, MD, PhD. It was created on their behalf by Gerilyn Nestle, COT an ophthalmic technician. The creation  of this record is the provider's dictation and/or activities during the visit.    Electronically signed by:  Gerilyn Nestle, COT 05.20.24 12:25 PM  This document serves as a record of services personally performed by Karie Chimera, MD, PhD. It was created on their behalf by Glee Arvin. Manson Passey, OA an ophthalmic technician. The creation of this record is the provider's dictation and/or activities during the visit.    Electronically signed by: Glee Arvin. Manson Passey, [place] 05.28.2024 12:25 PM   Karie Chimera, M.D., Ph.D. Diseases & Surgery of the Retina and Vitreous [facility]  I have reviewed the above documentation for accuracy and completeness, and I agree with the above. Karie Chimera, M.D., Ph.D. 03/08/23 12:27 PM   Abbreviations: M myopia (nearsighted); A astigmatism; H hyperopia (farsighted); P presbyopia; Mrx spectacle prescription;  CTL contact lenses; OD right eye; OS left eye; OU both eyes  XT exotropia; ET esotropia; PEK punctate epithelial keratitis; PEE punctate epithelial erosions; DES dry eye syndrome; MGD meibomian gland dysfunction; ATs artificial tears; PFAT's preservative free artificial  tears; NSC nuclear sclerotic cataract; PSC posterior subcapsular cataract; ERM epi-retinal membrane; PVD posterior vitreous detachment; RD retinal detachment; DM diabetes mellitus; DR diabetic retinopathy; NPDR non-proliferative diabetic retinopathy; PDR proliferative diabetic retinopathy; CSME clinically significant macular edema; DME diabetic macular edema; dbh dot blot hemorrhages; CWS cotton wool spot; POAG primary open angle glaucoma; C/D cup-to-disc ratio; HVF humphrey visual field; GVF goldmann visual field; OCT optical coherence tomography; IOP intraocular pressure; BRVO Branch retinal vein occlusion; CRVO central retinal vein occlusion; CRAO central retinal artery occlusion; BRAO branch retinal artery occlusion; RT retinal tear; SB scleral buckle; PPV pars plana vitrectomy; VH Vitreous hemorrhage; PRP panretinal laser photocoagulation; IVK intravitreal kenalog; VMT vitreomacular traction; MH Macular hole;  NVD neovascularization of the disc; NVE neovascularization elsewhere; AREDS age related eye disease study; ARMD age related macular degeneration; POAG primary open angle glaucoma; EBMD epithelial/anterior basement membrane dystrophy; ACIOL anterior chamber intraocular lens; IOL intraocular lens; PCIOL posterior chamber intraocular lens; Phaco/IOL phacoemulsification with intraocular lens placement; PRK photorefractive keratectomy; LASIK laser assisted in situ keratomileusis; HTN hypertension; DM diabetes mellitus; COPD chronic obstructive pulmonary disease

## 2023-03-08 ENCOUNTER — Encounter (INDEPENDENT_AMBULATORY_CARE_PROVIDER_SITE_OTHER): Payer: Self-pay | Admitting: Ophthalmology

## 2023-03-08 ENCOUNTER — Ambulatory Visit (INDEPENDENT_AMBULATORY_CARE_PROVIDER_SITE_OTHER): Payer: BC Managed Care – PPO | Admitting: Ophthalmology

## 2023-03-08 DIAGNOSIS — H43823 Vitreomacular adhesion, bilateral: Secondary | ICD-10-CM

## 2023-03-08 DIAGNOSIS — H35033 Hypertensive retinopathy, bilateral: Secondary | ICD-10-CM | POA: Diagnosis not present

## 2023-03-08 DIAGNOSIS — H04123 Dry eye syndrome of bilateral lacrimal glands: Secondary | ICD-10-CM

## 2023-03-08 DIAGNOSIS — I10 Essential (primary) hypertension: Secondary | ICD-10-CM

## 2023-03-08 DIAGNOSIS — H40039 Anatomical narrow angle, unspecified eye: Secondary | ICD-10-CM

## 2023-03-08 DIAGNOSIS — Z79899 Other long term (current) drug therapy: Secondary | ICD-10-CM

## 2023-03-08 DIAGNOSIS — H25813 Combined forms of age-related cataract, bilateral: Secondary | ICD-10-CM

## 2023-03-10 NOTE — Progress Notes (Signed)
PATIENT: Katie [name] DOB: July 02, 1966  REASON FOR VISIT: follow up HISTORY FROM: patient  Chief Complaint  Patient presents with   Follow-up    Pt in room 1. Here for cpap follow up. Pt reports still having daytimes sleepiness. Pt works 3rd shift on weekend she does not wear cpap on weekends.      HISTORY OF PRESENT ILLNESS:  03/21/23 ALL:  Katie [name] returns for follow up for OSA on CPAP. She was last seen 10/2022 and had sub optimal compliance. Since, she has worked on improving compliance. Previously 39%, now 71% compliant. 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 She does not use CPAP on nights that she works as a Engineer, structural. She may not sleep for 24 hours on nights she works as a Engineer, structural. She reports having a hard time getting to sleep at night during the week. She is on prednisone for Lupus. PCP tried her on cyclobenzaprine but it made her feel too groggy the next day. Melatonin helped her fall asleep but she had a hard time getting back to sleep if she woke up.     11/08/2022 ALL: Katie [name] returns for follow up for OSA on CPAP. She was last seen 04/2022 and struggling to meet compliance recommendations. Since, she reports doing fairly well until mid December. She obtained a new hairstyle that does not allow correct fit of mask/headgear. She has not used CPAP since. She has not noted any particular improvement in sleep quality but does feel that morning headaches are better when using CPAP consistently. She denies concerns with machine or supplies. She does wish to continue using CPAP.     04/28/2022 ALL: Katie [name] returns for follow up for OSA on CPAP. She was last seen 10/2021 and having difficulty with compliance. She reports that she has continued to use CPAP but is not consistent. She denies any concerns with her machine or mask. She is able to tolerate CPAP when she uses it. She states that if she is in pain, she does not use her machine. Some  nights she doesn't go to bed until late so she will not use CPAP. She sleeps well when she is using CPAP. 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 Dizziness has resolved. She feels medication side effects were contributing.     10/13/2021 ALL: Katie [name] is a 57 y.o. female here today for follow up for OSA on CPAP. HST 02/2021 showed severe OSa with total AHI of 86.2/hr.  She was last seen by Dr Vickey Huger 06/02/2021. She continued to report concerns of insomnia and fatigue. Dr Dohmeier felt that fatigue was most likely related to autoimmune disease as compliance data showed excellent management of OSA/CSA with residual AHI of 0. Headaches and sharp pains had significantly improved on CPAP. Dizziness continued with fatigue. She called 08/2021 reporting continued symptoms of dizziness, daytime sleepiness and fatigue. She reports these symptoms occur intermittently. She reports pain that prevents her from using CPAP. Tylenol usually helps pain. She also has insomnia and wakes multiple times at night to urinate. She is on daily prednisone. She is taking melatonin that does help her get to sleep. She does endorse more restful sleep and feels less fatigued when using CPAP. Most recently, she has not used CPAP consistently. Daily compliance at 53% and 4 hour at 30% over last 30 days. AHI was 0. She denies difficulty staying awake while driving. She drives a  bus employment. She has a significant cardiac history of CHF, CAD and cardiac tamponade post MVR in 05/2010. She is followed for pulmonary HTN. No longer on O2.   HISTORY: (copied from Dr Dohmeier's previous note)  Interval History:  Katie [name] is a 57 y.o. female patient who had been seen at [facility] and is,now re- referred by Dr Renne Crigler. Dizziness and sharp headaches. RV 06-02-2021: Mrs. Tenorio was last seen on 4 19-22 and had a procedure with a diagnosis of primary pulmonary hypertension on 02-1621.  Her insurance only approved of a home sleep test she  had endorsed sleepiness when she drives the bus.  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  She had a pericardial effusion and has a history of coronary artery disease pulmonary hypertension.   Her home sleep test was performed on 02-25-2021 and showed a sleep time of 7 hours with 60% REM sleep, her AHI was astronomically high at 86 apneas and hypopneas per hour.  The maximum oxygen saturation was 98% but the nadir was only 78% she had frequent short oxygen saturations but the total desaturation time was only 2.6 minutes.  She was prescribed a CPAP with 6 to 18 cm auto titration spectrum pressure 3 cm EPR and a mask of her choice.  The goal was to follow her today and see how she has been doing the 95th percentile pressure for this patient has been 7 cmH2O the residual AHI is only 3.7/h which is a good resolution so she she has been 90% compliant which gives 27 out of 30 days of use was 26 of those days over 4 hours and an average use at time on days used of 5.3 hours.  The air leak that she initially presented with [place] in June has much decreased to the months of July. She switched to a FFM, F 20 Resmed.    She continues to use oxygen at 2 L/min bled into her auto CPAP machine. She has not found to have any more headaches  (!)but remains as fatigued and sleepy as before. The apnea is not promoting this hypersomnia or fatigue any longer, last month download of CPAP showed a residual AHI of 0.0/h on the new FFM !  She is still having insomnia:  her son is 84 and in 7th grade.     REVIEW OF SYSTEMS: Out of a complete 14 system review of  symptoms, the patient complains only of the following symptoms, chronic pain, insomnia, fatigue and all other reviewed systems are negative.  ESS: 6/24  ALLERGIES: Allergies  Allergen Reactions   Cephalexin Hives   Methotrexate Nausea And Vomiting    HOME MEDICATIONS: Outpatient Medications Prior to Visit  Medication Sig Dispense Refill   acetaminophen (TYLENOL) 325 MG tablet Take 650 mg by mouth every 6 (six) hours as needed for moderate pain.     Anifrolumab-fnia (SAPHNELO IV) Inject into the vein.     anifrolumab-fnia 300 mg in sodium chloride 0.9 % Inject 300 mg into the vein once.     aspirin 81 MG EC tablet Take 81 mg by mouth daily.     azaTHIOprine (IMURAN) 50 MG tablet  Take 50 mg by mouth daily.      calcium carbonate (OS-CAL - DOSED IN MG OF ELEMENTAL CALCIUM) 1250 (500 Ca) MG tablet Take 2 tablets by mouth daily.     cholecalciferol (VITAMIN D3) 25 MCG (1000 UNIT) tablet Take 1,000 Units by mouth daily.     Cyanocobalamin (B-12) 1000 MCG TABS Take 1,000 mcg by mouth daily.     Folic Acid (FOLATE PO) Take 666 mcg by mouth daily.     furosemide (LASIX) 20 MG tablet Take 20 mg by mouth daily.     Magnesium 250 MG TABS Take 250 mg by mouth daily.     metoprolol succinate (TOPROL-XL) 25 MG 24 hr tablet Take 0.5 tablets (12.5 mg total) by mouth daily. 45 tablet 3   Multiple Vitamin (MULTIVITAMIN) tablet Take 2 tablets by mouth daily.     potassium chloride SA (KLOR-CON M20) 20 MEQ tablet Take 1 tablet (20 mEq total) by mouth daily. 180 tablet 3   predniSONE (DELTASONE) 5 MG tablet Take 5 mg by mouth daily.     SPIRIVA RESPIMAT 2.5 MCG/ACT AERS INHALE 2 PUFFS BY MOUTH INTO THE LUNGS DAILY 4 g 5   Tiotropium Bromide-Olodaterol (STIOLTO RESPIMAT) 2.5-2.5 MCG/ACT AERS Inhale 2 puffs into the lungs daily. 2 each 0   valACYclovir (VALTREX) 1000 MG tablet Take 1,000 mg by mouth daily as needed (fever blisters).     warfarin (COUMADIN) 5 MG tablet Take as directed by the anticoagulation  clinic. 135 tablet 3   Belimumab (BENLYSTA) 200 MG/ML SOSY Inject 1 Dose into the skin every Monday. (Patient not taking: Reported on 02/28/2023)     No facility-administered medications prior to visit.    PAST MEDICAL HISTORY: Past Medical History:  Diagnosis Date   Abscess of right breast    sternal osteomyeliitis, pseudomonas   C. 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    Cataract    Mixed OU   CHF (congestive heart failure) (HCC)    secondary to mmoderate mitral stenosis and moderate mitral regurgitation in the setting of rheumatic vlave disease as well as severe pulmonary HTN likely due to a combination of MV disease and pulmoanary arterial HTN in the setting of collagen vascular disease. Not valvuloplasty candidate due to MR. patient had MV replacement with mechanical MV in 8/11.   CHF (congestive heart failure) (HCC)    Echo post-op showed normally functioning mechanical MV, EF 60-65% with septal bounce, small mobile structure in the mid ventricle (? loose papillary muscle), RV-RA gradient 22 mmHg. Echo (10/11): EF 60-65%, normally functioning mechanical mitral vlavue.    Hypertensive retinopathy    OU   Mixed connective tissue disease (HCC)    diagnosis 1990. 8/10 [name] positive, anti-dDNA positive, anti-SCL70 negative   Nephrotic syndrome    secondary to SLE   Pulmonary HTN (HCC)    porbably secondary to combination of rheumatic MV disease and pulmonary arterial HYN form vollagen vascalr disease. CTA chest (7/10) with no evidence for pulmonary emboli or chronic pulmonary emboli. PA pressure 72/28 mean 50 with PVR 5.7 WU on initial RHC, PA pressure 45/21 mean 30 PVR 2.3 WU on f/u RHC on Revatio 80 mg three times a day (1/11).  After MV surgery, Revatio stopped.   Pulmonary HTN (HCC)    increased  dyspnea. Repeat RHC with mean RA 6, PA  42/20 mean 29, PVR 4.8 WU,  2.3. Revatio 20 mg  3x a day restarted. 3/11 6 min walk 420m. 2/12 6 min walk 427 m.    Raynaud's syndrome    Restrictive lung disease    secondary to MCTD. PFTs 03/06/99 VC 47% DLCO 26%. PFTs 09/12/08 VC 43% DLCO 45%. PFTs 6/10 FVC 37%, FEV1 34%, ratio 69%, TLC 50%. using home O2 periodically   Rheumatic fever    Warfarin anticoagulation    for mechanical MV, goal INR 2.5-3.5    PAST SURGICAL HISTORY: Past Surgical History:  Procedure Laterality Date   BREAST CYST ASPIRATION  03/09/2012   CESAREAN SECTION     COLONOSCOPY     RIGHT HEART CATH N/A 08/10/2021   Procedure: RIGHT HEART CATH;  Surgeon: Laurey Morale, MD;  Location: [facility] INVASIVE CV LAB;  Service: Cardiovascular;  Laterality: N/A;   s/p mitral valvue replacement  05/13/2010   by Dr. [name] by minimally invasive window.    TUBAL LIGATION      FAMILY HISTORY: Family History  Problem Relation Age of Onset   Emphysema Mother        smoker   COPD Mother    Heart disease Maternal Grandmother        side of family not given   Diabetes Maternal Grandmother    Hypertension Father    Osteoarthritis Father    Sleep apnea Father    Breast cancer Maternal Aunt     SOCIAL HISTORY: Social History   Socioeconomic History   Marital status: Single    Spouse name: Not on file   Number of children: Not on file   Years of education: Not on file   Highest education level: Not on file  Occupational History   Not on file  Tobacco Use   Smoking status: Never   Smokeless tobacco: Never  Vaping Use   Vaping Use: Never used  Substance and Sexual Activity   Alcohol use: No   Drug use: No   Sexual activity: Not Currently    Birth control/protection: Other-see comments    Comment: BTL  Other Topics Concern   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    Social Determinants of Health   Financial Resource Strain: Not on file  Food  Insecurity: Not on file  Transportation Needs: Not on file  Physical Activity: Not on file  Stress: Not on file  Social Connections: Not on file  Intimate Partner Violence: Not on file     PHYSICAL EXAM  Vitals:   03/21/23 0941  BP: 113/69  Pulse: 83  Weight: 182 lb 6.4 oz (82.7 kg)  Height: 5\' 5"  (1.651 m)     Body mass index is 30.35 kg/m.  Generalized: Well developed, in no acute distress  Cardiology: normal rate and rhythm, no murmur noted Respiratory: clear to auscultation bilaterally  Neurological examination  Mentation: Alert oriented to time, place, history taking. Follows all commands speech and language fluent Cranial nerve II-XII: Pupils were equal round reactive to light. Extraocular movements were full, visual field were full  Motor: The motor testing reveals 5 over 5 strength of all 4 extremities. Good symmetric motor tone is noted throughout.  Gait and station: Gait is normal.    DIAGNOSTIC DATA (LABS, IMAGING, TESTING) - I reviewed patient records, labs, notes, testing and imaging myself where available.      No data to display  Lab Results  Component Value Date   WBC 3.5 (L) 11/15/2022   HGB 13.2 11/15/2022   HCT 39.5 11/15/2022   MCV 99.2 11/15/2022   PLT 195 11/15/2022      Component Value Date/Time   NA 140 11/15/2022 1118   K 3.7 11/15/2022 1118   CL 105 11/15/2022 1118   CO2 28 11/15/2022 1118   GLUCOSE 86 11/15/2022 1118   BUN 8 11/15/2022 1118   CREATININE 0.83 11/15/2022 1118   CALCIUM 8.6 (L) 11/15/2022 1118   PROT 6.5 07/01/2010 0908   ALBUMIN 3.2 (L) 07/01/2010 0908   AST 29 07/01/2010 0908   ALT 16 07/01/2010 0908   ALKPHOS 80 07/01/2010 0908   BILITOT 0.5 07/01/2010 0908   GFRNONAA >60 11/15/2022 1118   GFRAA >60 05/22/2019 1233   Lab Results  Component Value Date   CHOL 149 01/14/2016   HDL 38 (L) 01/14/2016   LDLCALC 92 01/14/2016   TRIG 93 01/14/2016   CHOLHDL 3.9 01/14/2016   Lab Results   Component Value Date   HGBA1C  05/11/2010    5.5 (NOTE)                                                                       According to the ADA Clinical Practice Recommendations for 2011, when HbA1c is used as a screening test:   >=6.5%   Diagnostic of Diabetes Mellitus           (if abnormal result  is confirmed)  5.7-6.4%   Increased risk of developing Diabetes Mellitus  References:Diagnosis and Classification of Diabetes Mellitus,Diabetes Care,2011,34(Suppl 1):S62-S69 and Standards of Medical Care in         Diabetes - 2011,Diabetes Care,2011,34  (Suppl 1):S11-S61.   No results found for: "VITAMINB12" Lab Results  Component Value Date   TSH 4.08 07/01/2010     ASSESSMENT AND PLAN 57 y.o. year old female  has a past medical history of Abscess of right breast, C. difficile diarrhea (1/12), CAD (coronary artery disease), Cardiac tamponade, Cataract, CHF (congestive heart failure) (HCC), CHF (congestive heart failure) (HCC), Hypertensive retinopathy, Mixed connective tissue disease (HCC), Nephrotic syndrome, Pulmonary HTN (HCC), Pulmonary HTN (HCC), Raynaud's syndrome, Restrictive lung disease, Rheumatic fever, and Warfarin anticoagulation. here with     ICD-10-CM   1. Severe obstructive sleep apnea-hypopnea syndrome  G47.33        DANEILLE WINOKUR has improved CPAP compliance. Compliance report now reveals optimal compliance. She was encouraged to continue using CPAP nightly and for greater than 4 hours each night. We have discussed benefits of using CPAP and association of sleep apnea in worsening pain, insomnia, nocturia, headaches, etc. Risks of untreated sleep apnea review and education materials provided. Healthy lifestyle habits encouraged. She will follow up in 1 year, sooner if needed. She verbalizes understanding and agreement with this plan.    No orders of the defined types were placed in this encounter.    No orders of the defined types were placed in this encounter.      Shawnie Dapper, FNP-C 03/21/2023, 10:23 AM [facility] [address], [address] [place], [place] [address] [phone]

## 2023-03-10 NOTE — Patient Instructions (Addendum)
Please continue using your CPAP regularly. While your insurance requires that you use CPAP at least 4 hours each night on 70% of the nights, I recommend, that you not skip any nights and use it throughout the night if you can. Getting used to CPAP and staying with the treatment long term does take time and patience and discipline. Untreated obstructive sleep apnea when it is moderate to severe can have an adverse impact on cardiovascular health and raise her risk for heart disease, arrhythmias, hypertension, congestive heart failure, stroke and diabetes. Untreated obstructive sleep apnea causes sleep disruption, nonrestorative sleep, and sleep deprivation. This can have an impact on your day to day functioning and cause daytime sleepiness and impairment of cognitive function, memory loss, mood disturbance, and problems focussing. Using CPAP regularly can improve these symptoms.  We will update supply orders, today. I am proud of you for increasing compliance. Keep up the good work!  Follow up in 1 year

## 2023-03-21 ENCOUNTER — Encounter: Payer: Self-pay | Admitting: Family Medicine

## 2023-03-21 ENCOUNTER — Ambulatory Visit: Payer: BC Managed Care – PPO | Admitting: Family Medicine

## 2023-03-21 VITALS — BP 113/69 | HR 83 | Ht 65.0 in | Wt 182.4 lb

## 2023-03-21 DIAGNOSIS — G4733 Obstructive sleep apnea (adult) (pediatric): Secondary | ICD-10-CM | POA: Diagnosis not present

## 2023-05-12 ENCOUNTER — Ambulatory Visit (INDEPENDENT_AMBULATORY_CARE_PROVIDER_SITE_OTHER): Payer: BC Managed Care – PPO | Admitting: Internal Medicine

## 2023-05-12 ENCOUNTER — Other Ambulatory Visit: Payer: Self-pay | Admitting: *Deleted

## 2023-05-12 DIAGNOSIS — J439 Emphysema, unspecified: Secondary | ICD-10-CM | POA: Diagnosis not present

## 2023-05-12 LAB — PULMONARY FUNCTION TEST
DL/VA % pred: 118 %
DL/VA: 5.04 ml/min/mmHg/L
DLCO cor % pred: 59 %
DLCO cor: 12.23 ml/min/mmHg
DLCO unc % pred: 59 %
DLCO unc: 12.23 ml/min/mmHg
FEF 25-75 Pre: 0.57 L/sec
FEF2575-%Pred-Pre: 22 %
FEV1-%Pred-Pre: 34 %
FEV1-Pre: 0.92 L
FEV1FVC-%Pred-Pre: 89 %
FEV6-%Pred-Pre: 39 %
FEV6-Pre: 1.31 L
FEV6FVC-%Pred-Pre: 103 %
FVC-%Pred-Pre: 38 %
Pre FEV1/FVC ratio: 70 %
Pre FEV6/FVC Ratio: 100 %

## 2023-05-12 MED ORDER — STIOLTO RESPIMAT 2.5-2.5 MCG/ACT IN AERS: 2.0000 | INHALATION_SPRAY | Freq: Every day | RESPIRATORY_TRACT | 11 refills | Status: DC

## 2023-05-12 NOTE — Progress Notes (Signed)
Spirometry and Dlco done today. 

## 2023-05-23 ENCOUNTER — Ambulatory Visit (HOSPITAL_BASED_OUTPATIENT_CLINIC_OR_DEPARTMENT_OTHER)
Admission: RE | Admit: 2023-05-23 | Discharge: 2023-05-23 | Disposition: A | Discharge status: Home / Self Care | Payer: BC Managed Care – PPO | Source: Ambulatory Visit | Attending: Internal Medicine | Admitting: Internal Medicine

## 2023-05-23 DIAGNOSIS — J439 Emphysema, unspecified: Secondary | ICD-10-CM | POA: Insufficient documentation

## 2023-05-28 NOTE — Progress Notes (Signed)
Several findings. Nothing urgent. Please discuss at your followup  xxxxxxxxxxxxxxxxxxxxxxxxxxxxxxxxxxxxxxxxxxxxxxxxxxxx IMPRESSION: 1. Evidence of tracheobronchomalacia, see comments. 2. Moderate to severe centrilobular and paraseptal emphysema with mild diffuse bronchial wall thickening, suggesting COPD. 3. No compelling evidence of interstitial lung disease. Bland postinfectious/postinflammatory scarring throughout both lungs is unchanged. 4. Three-vessel coronary atherosclerosis. 5. Aortic Atherosclerosis (ICD10-I70.0) and Emphysema (ICD10-J43.9).   Electronically Signed   By: Delbert Phenix M.D.   On: 05/26/2023 15:04     Exam Ended: 05/23/23 09:55 Last Resulted: 05/26/23 15:04

## 2023-05-31 ENCOUNTER — Ambulatory Visit: Payer: BC Managed Care – PPO | Admitting: Primary Care

## 2023-05-31 ENCOUNTER — Ambulatory Visit: Payer: BC Managed Care – PPO | Admitting: Internal Medicine

## 2023-06-27 ENCOUNTER — Ambulatory Visit: Payer: BC Managed Care – PPO | Admitting: Internal Medicine

## 2023-08-18 ENCOUNTER — Telehealth (INDEPENDENT_AMBULATORY_CARE_PROVIDER_SITE_OTHER): Payer: Self-pay | Admitting: Otolaryngology

## 2023-08-18 NOTE — Progress Notes (Signed)
OV 11/19/2020  Subjective:  Patient ID: Katie [name], female , DOB: 09-12-1966 , age 57 y.o. , MRN: [id number] , ADDRESS: [address] [place] [address] PCP Merri Brunette, MD Patient Care Team: Merri Brunette, MD as PCP - General (Internal Medicine)  This Provider for this visit: Treatment Team:  Attending Provider: Kalman Shan, MD    11/19/2020 -   Chief Complaint  Patient presents with   Consult    ILD consult. Chronic cough, SOB with exertion. Some wheezing     HPI Katie [name] 57 y.o. -presents for new consultation.  She is to see Dr. Sandrea Hughs but that was several years ago and therefore is a new consultation.  She has been referred by Dr. Marca Ancona based on chart review and her history.  She has mitral valve prolapse and she says on a 6-minute walk test she desaturated.  And given her lupus she has been referred here.  She gives a history of SLE since early 54s.  She is currently on immunosuppressed with prednisone, Imuran and also Benlysta infusions.  She says lupus initially involved her kidneys but does not at this point in time.  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  Also involves the heart and is the basis of mitral valve repair.  She says she also has Raynaud's.  Overall she feels lupus is well controlled.  She works as a Surveyor, mining  In terms of pulmonary in 2012 she had CT scan of the chest that reported emphysema but no obvious description of fibrosis but chest x-rays in 2015 reported fibrosis she does not have any CT scan of the chest at that time.  Her last PFTs were in 2015 that is reviewed below and visualized the image myself.  It shows restriction with a low DLCO consistent with someone with fibrosis.  In terms of her symptoms she has stable dyspnea on exertion.  She works as a Surveyor, mining.  She has chronic cough that for which she believes she will benefit from any  symptom relief.  She has had a Covid vaccine but the response to the vaccine is not known.  There is no Covid IgG check so far.  She is open to using oxygen but does not want to use it at work.  She denies any mold exposure or bird exposure or feather dust exposure or having down pillow   Echocardiogram #2021  -Normal functioning mitral valve that was repaired.  Normal ejection fraction normal right ventricular function   Sleep study 2017   -Apnea-hypopnea index 2.7  Results for FALISA, MARCUCCI (MRN [id number]) as of 11/19/2020 10:00  Ref. Range 06/10/2009 20:08  Anti Nuclear Antibody (ANA) Latest Ref Range: NEGATIVE  POS (A)  ANA Pattern 1 Unknown SPECKLED  ANA Titer 1 Latest Ref Range: <1:40  1:320 (H)  ds DNA Ab Latest Ref Range: <5  69 (H)  Scleroderma (Scl-70) (ENA) Antibody, IgG Latest Ref Range: <1.0  0.5     Walk test  '  CT Chest data    PFT   Results for LATEIA, BRUMBLEY (MRN [id number]) as of 11/19/2020 10:00  Ref. Range 08/19/2020 10:33  Creatinine Latest Ref Range: 0.44 - 1.00 mg/dL 6.29  Results for DELANO, ZALAZAR (MRN [id number]) as of 11/19/2020 10:00  Ref. Range 08/19/2020 10:33  Hemoglobin Latest Ref Range: 12.0 - 15.0 g/dL 01.0   ECHO 27/25/3664  IMPRESSIONS     1. Left ventricular ejection fraction, by estimation, is 60 to 65%. The  left ventricle has normal function. The left ventricle has no regional  wall motion abnormalities. Left ventricular diastolic function could not  be evaluated.   2. Right ventricular systolic function was not well visualized. The right  ventricular size is normal. There is normal pulmonary artery systolic  pressure. The estimated right ventricular systolic pressure is 24.9 mmHg.   3. Left atrial size was mildly dilated.   4. The mitral valve has been repaired/replaced. No evidence of mitral  valve regurgitation. No evidence of mitral stenosis. The mean mitral valve  gradient is 5.2 mmHg with average heart rate of 89 bpm.  There is a  mechanical valve present in the mitral  position. Procedure Date: 05/19/2010. Echo findings are consistent with  normal structure and function of the mitral valve prosthesis.   5. The aortic valve is normal in structure. Aortic valve regurgitation is  not visualized. No aortic stenosis is present.   6. The inferior vena cava is normal in size with greater than 50%  respiratory variability, suggesting right atrial pressure of 3 mmHg.   Comparison(s): No significant change from prior study. Prior images  reviewed side by side.   FINDINGS   Left Ventricle: Left ventricular ejection fraction, by estimation, is 60  to 65%. The left ventricle has normal function. The left ventricle has no  regional wall motion abnormalities. The left ventricular internal cavity  size was normal in size. There is   no left ventricular hypertrophy. Abnormal (paradoxical) septal motion  consistent with post-operative status. Left ventricular diastolic function  could not be evaluated due to mitral valve replacement. Left ventricular  diastolic function could not be  evaluated.   Right Ventricle: The right ventricular size is normal. No increase in  right ventricular wall thickness. Right ventricular systolic function was  not well visualized. There is normal pulmonary artery systolic pressure.  The tricuspid regurgitant velocity is   2.34 m/s, and with an assumed right atrial pressure of 3 mmHg, the  estimated right ventricular systolic pressure is 24.9 mmHg.   Left Atrium: Left atrial size was mildly dilated.   Right Atrium: Right atrial size was normal in size.   Pericardium: There is no evidence of pericardial effusion.   Mitral Valve: The mitral valve has been repaired/replaced. No evidence of  mitral valve regurgitation. There is a mechanical valve present in the  mitral position. Procedure Date: 05/19/2010. Echo findings are consistent  with normal structure and function  of the  mitral valve prosthesis. No evidence of mitral valve stenosis. The  mean mitral valve gradient is 5.2 mmHg with average heart rate of 89 bpm.   Tricuspid Valve: The tricuspid valve is normal in structure. Tricuspid  valve regurgitation is mild . No evidence of tricuspid stenosis.   Aortic Valve: The aortic valve is normal in structure. Aortic valve  regurgitation is not visualized. No aortic stenosis is present.   Pulmonic Valve: The pulmonic valve was normal in structure. Pulmonic valve  regurgitation is not visualized. No evidence of pulmonic stenosis.   Aorta: The aortic root is normal in size and structure.   Venous: The inferior vena cava is normal in size with greater than 50%  respiratory variability, suggesting right atrial pressure of 3 mmHg.   IAS/Shunts: No atrial level shunt detected by color flow      OV 02/05/2021  Subjective:  Patient ID: Katie [name],  female , DOB: 10-19-1965 , age 50 y.o. , MRN: [id number] , ADDRESS: [address] [place] [address] PCP Merri Brunette, MD Patient Care Team: Merri Brunette, MD as PCP - General (Internal Medicine)  This Provider for this visit: Treatment Team:  Attending Provider: Kalman Shan, MD    02/05/2021 -   Chief Complaint  Patient presents with   Follow-up    Doing ok, breathing is the same     HPI MKENZIE TACKER 57 y.o. - returns for follow-up.  She has lupus immunosuppression restricted PFTs exertional hypoxemia and exertional dyspnea.  High concern for interstitial lung disease but she had high-resolution CT chest the chest shows emphysema.  She had pulmonary function test that still shows restriction and low DLCO but it is stable compared to 2015.  I interviewed her.  Her mom used to be a heavy smoker and she died in 09/28/2005.  During this time patient was heavily exposed to cigarette smoke at home.  The current CT is similar to 2011/09/29 many years ago which showed emphysema.  Patient is not using exertional  oxygen because of poor customer service from [employer].  She does not want to be with that agency again.  She is willing to try other treatment for emphysema.  I had a to the ILD exposure questionnaire.  It appears that she has shortness of breath for 13 years gradually getting worse.  Symptom score is below.  She also has a chronic cough that is moderate in intensity and is been going on since 09/28/08.  She does have lupus she does have Raynaud's.  She has fatigue she has arthralgia she is really not she does have some snoring.  Family history is positive for emphysema in her mother.  Organic and inorganic antigen exposure history is negative    CT Chest data 12/01/20   IMPRESSION: 1. Emphysema (ICD10-J43.9) with scattered pulmonary parenchymal scarring. No definitive evidence of superimposed interstitial lung disease. 2. Aortic atherosclerosis (ICD10-I70.0). Coronary artery calcification.     Electronically Signed   By: Leanna Battles M.D.   On: 12/01/2020 13:59     OV 06/11/2021  Subjective:  Patient ID: Katie [name], female , DOB: 03-06-1966 , age 7 y.o. , MRN: [id number] , ADDRESS: [address] [place] [address] PCP Merri Brunette, MD Patient Care Team: Merri Brunette, MD as PCP - General (Internal Medicine)  This Provider for this visit: Treatment Team:  Attending Provider: Kalman Shan, MD    06/11/2021 -   Chief Complaint  Patient presents with   Follow-up    Pt states she has been doing okay since last visit. States that she still is becoming SOB with exertion.   HPI DELLENE CUMBERLEDGE 57 y.o. -presents for follow-up.  She was originally referred because of concern of lupus related interstitial lung disease because of the presence of lupus, immunosuppression and restrictive PFTs.  However admitted high-resolution CT chest earlier this year there is only emphysema.  She gave a strong history of passive smoking.  So when I saw her in April 2022 started on  Spiriva.  She is here for follow-up.  She tells me that the fatigue and cough is significantly improved with Spiriva but shortness of breath persists.  She says overall shortness of breath is stable but looking at the score shortness of breath might be worse.  Review of the chart indicate that in the interim Dr. [name] is the main increase Lasix for her.  There is  no active smoking or passive smoking currently.  She has never been to pulmonary rehabilitation.  She is using CPAP since spring 2022.  She is also using oxygen at night.       OV 04/16/2022  Subjective:  Patient ID: Katie [name], female , DOB: 1966-09-07 , age 7 y.o. , MRN: [id number] , ADDRESS: [address] [place] [address] PCP Merri Brunette, MD Patient Care Team: Merri Brunette, MD as PCP - General (Internal Medicine)  This Provider for this visit: Treatment Team:  Attending Provider: Kalman Shan, MD    04/16/2022 -   Chief Complaint  Patient presents with   Follow-up    PFT performed today.  Pt states she has been doing okay since last visit and denies any complaints.    Follow-up   HPI NAHRIAH BENNISON 57 y.o. -presents for routine follow-up.  Last seen in September 2022.  She says she cannot do pulmonary rehabilitation because of her job and logistics.  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  Overall she says she is doing fairly well.  Her main issues are that when she lies down on the right side she gets short of breath.  She says this been going on since the diagnose of pulmonary hypertension.  She also explains that when she tries to crunch her abdomen and bent down to pick up something she gets more short of breath otherwise routine activities are fine.  Explained this is because of diaphragmatic compression.  There is no longer any passive smoking exposure.  In the past she had significant passive smoking exposure.  She had lung function today that  shows severe obstruction with an FEV1 of 0.8 L / 31% and a ratio of 69.  Diffusion is also reduced at 10.7/51%.  Flow volume loop is consistent with obstruction.  Spiriva is working well for her.    OV 07/29/2022  Subjective:  Patient ID: Katie [name], female , DOB: 27-Nov-1965 , age 70 y.o. , MRN: [id number] , ADDRESS: [address] [place] [address] PCP Merri Brunette, MD Patient Care Team: Merri Brunette, MD as PCP - General (Internal Medicine)  This Provider for this visit: Treatment Team:  Attending Provider: Kalman Shan, MD   07/29/2022 -   Chief Complaint  Patient presents with   Follow-up    Pt states she has been doing okay since last visit and denies any complaints.     HPI AMILEE PARKINS 57 y.o. -returns for follow-up.  Not seen her since July 2023.  She is stable.  The platypnea is resolved.  She believes that after she increased her CPAP the platypnea resolved.  She does not have much shortness of breath.  It is all stable.  She feels.  Was adequate enough she does not want increase the inhalers.  She is off her of audio based on cardiology notes.  She continues on Imuran and Benlysta.  She will have a flu shot today.      OV 02/28/2023  Subjective:  Patient ID: Katie [name], female , DOB: [birth year] , age 81 y.o. , MRN: [id number] , ADDRESS: [address] [place] [address] PCP Fatima Sanger, FNP Patient Care Team: Fatima Sanger, FNP as PCP - General (Internal Medicine)  This Provider for this visit: Treatment Team:  Attending Provider: Kalman Shan, MD  02/28/2023 -   Chief Complaint  Patient presents with   Follow-up  F/up on cough.     HPI LAWONDA LUBERTO 57 y.o. -returns for follow-up.  She tells me that she is doing well but she is being bothered by cough.  I originally started Spiriva for her spastic smoking-related emphysema in April 2022 which was 2 years ago.  This brought the cough score down from 5 of  5 to 2 of 5 but now it is a 3 out of 5.  She tells me that the cough is there early in the morning and then intermittently she is coughing throughout the day.  It is chronic.  She is frustrated by this and wants better relief.  Today on exam there was some bilateral upper lobe crackles which I do not recall auscultating before.  For her lupus she continues on Imuran and prednisone but her [place] has been changed to sAPHENLO..  I noticed that she is not on Bactrim for PCP prophylaxis.  I encouraged her to talk about this with her rheumatologist.  Otherwise no medical issues.  No ER visits no surgeries no urgent care visits.  Her last imaging study was in 2022.  Explained potential risk for lung cancer in past were smokers and also interstitial lung disease and lupus patients.  She is willing to get restaged.       OV 08/19/2023  Subjective:  Patient ID: Katie [name], female , DOB: Oct 05, 1966 , age 40 y.o. 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 Vickey Huger on CPAP at night since spring 2022   -Original sleep study 2017 -May 2022 sleep studyL: 02-25-2021 and showed a sleep time of 7 hours with 60% REM sleep, her AHI was astronomically high at 86 apneas and hypopneas per hour.  The maximum oxygen saturation was 98% but the nadir was only 78% she had frequent short oxygen saturations but the total desaturation time was only 2.6 minutes.  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 Marca Ancona.  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 Marca Ancona  -CT angiogram chest July 2010 with no evidence of PE  -Status post mitral valve replacement in 2011  -On mechanical anticoagulation  -Unclear date of most recent cardiac cath but apparently improved since mitral valve replacement per Dr. [name] notes November 2021  -Most recent echo November 2021 with RV systolic pressure 25 and normal EF left ventricle.  Unable to estimate diastolic function  -Right heart cath October 2022 after stopping sildenafil showed normal PA pressures and filling pressures.  -Restricted PFTs on evaluation but CT scan of the chest 2022 with emphysema and strong history of passive smoking   -  No ILD on CT chest`  -Started Spiriva April 2022 -> stiolto as of early 2024  -History of exposure to passive smoking  08/19/2023 -   Chief Complaint  Patient presents with   Follow-up    Pt states she was off stiloto for a week, which increased coughing. She is feel better now, back on inhaler      HPI MARYFER SITTON 57 y.o. -returns for follow-up.  Last seen in May 2024.  Since then she had CT scan of the chest in August 2025 personally visualized it showed it to her.  She does have emphysema this also tracheobronchomalacia.  We discussed a little bit about tracheobronchomalacia.  She continues on [employer].  She stopped Stiolto for a week couple weeks ago because she ran out of the medication but since then she is back on it.  When she was off Stiolto her cough got worse but now she is  back to baseline.  She is no longer exposed to passive smoking.  She currently feels stable with her health and she does not want to escalate any therapy.  Will try to do an exercise hypoxemia test but there is no forehead probe in our office today.  Her finger pulse ox would not pick up well  Other issues -based on her history and external medical record review. - She had a follow-up with Shawnie Dapper nurse practitioner for sleep apnea in June 2024 - She is seeing Dr. [name] for chronic systolic heart failure as recently as February 2024 and that is when her last echo was. -We will give her a flu shot today     SYMPTOM SCALE - emphysema, luspus 02/05/2021  06/11/2021 spiriva  O2 use ra ra  Shortness of Breath 0 -> 5 scale with 5 being worst (score 6 If unable to do)   At rest 0 0  Simple tasks - showers, clothes change, eating, shaving 0 1  Household (dishes, doing bed, laundry) 1 3  Shopping 3 4  Walking level at own pace 4 5  Walking up Stairs 5 5  Total (30-36) Dyspnea Score 13 18  How bad is your cough? 5 2  How bad is your fatigue 5 3  How bad is nausea 0 0  How bad is vomiting?  0 0  How bad is diarrhea? 0 0  How bad is anxiety? 3 2  How bad is depression 2 2      Walk test  Simple office walk 185 feet x  3 laps goal with forehead probe 11/19/2020  06/11/2021  08/19/2023   O2 used ra ra Could not do due to ppor tracing  Number laps completed 3 3   Comments about pace x avg   Resting Pulse Ox/HR 100% and 87/min 100% and 102   Final Pulse Ox/HR 88% and 110/min 95% and 130   Desaturated </= 88% yes no   Desaturated <= 3% points yes Yes 5 ponts   Got Tachycardic >/= 90/min yes Yes tachy even at rest   Symptoms at end of test x Moderate dyspnea   Miscellaneous comments x        PFT     Latest Ref Rng & Units 05/12/2023    3:25 PM 04/16/2022    9:00 AM 12/05/2020    8:58 AM 08/16/2014    9:02 AM 02/08/2014    9:57 AM  ILD indicators  FVC-Pre L  1.22  1.17  1.16  1.14    FVC-Predicted  Pre % 38  35  41  39  38   FVC-Post L   1.27  1.25  1.16   FVC-Predicted Post %   44  42  39   TLC L   3.28  3.23  3.04   TLC Predicted %   64  63  60   DLCO uncorrected ml/min/mmHg 12.23  10.69  9.57  11.83  11.54   DLCO UNC %Pred % 59  51  45  48  47   DLCO Corrected ml/min/mmHg 12.23  11.05      DLCO COR %Pred % 59  53          LAB RESULTS last 96 hours No results found.  LAB RESULTS last 90 days No results found for this or any previous visit (from the past 2160 hour(s)).       has a past medical history of Abscess of right breast, C. difficile diarrhea (1/12), CAD (coronary artery disease), Cardiac tamponade, Cataract, CHF (congestive heart failure) (HCC), CHF (congestive heart failure) (HCC), Hypertensive retinopathy, Mixed connective tissue disease (HCC), Nephrotic syndrome, Pulmonary HTN (HCC), Pulmonary HTN (HCC), Raynaud's syndrome, Restrictive lung disease, Rheumatic fever, and Warfarin anticoagulation.   reports that she has never smoked. She has never used smokeless tobacco.  Past Surgical History:  Procedure Laterality Date   BREAST CYST ASPIRATION  03/09/2012   CESAREAN SECTION     COLONOSCOPY     RIGHT HEART CATH N/A 08/10/2021   Procedure: RIGHT HEART CATH;  Surgeon: Laurey Morale, MD;  Location: [facility] INVASIVE CV LAB;  Service: Cardiovascular;  Laterality: N/A;   s/p mitral valvue replacement  05/13/2010   by Dr. [name] by minimally invasive window.    TUBAL LIGATION      Allergies  Allergen Reactions   Cephalexin Hives   Methotrexate Nausea And Vomiting    Immunization History  Administered Date(s) Administered   Influenza Split 09/22/2013, 07/11/2014   Influenza, Quadrivalent, Recombinant, Inj, Pf 07/20/2021   Influenza,inj,Quad PF,6+ Mos 05/31/2017, 07/15/2018, 07/29/2022   Influenza-Unspecified 07/25/2012, 08/22/2013, 07/29/2014, 07/17/2015, 07/06/2016, 07/29/2022   PFIZER(Purple Top)SARS-COV-2 Vaccination 06/23/2020   Pneumococcal  Conjugate-13 01/04/2018   Pneumococcal Polysaccharide-23 07/23/2009, 05/10/2018   Tdap 02/10/2011   Unspecified SARS-COV-2 Vaccination 12/10/2019    Family History  Problem Relation Age of Onset   Emphysema Mother        smoker   COPD Mother    Heart disease Maternal Grandmother        side of family not given   Diabetes Maternal Grandmother    Hypertension Father    Osteoarthritis Father    Sleep apnea Father    Breast cancer Maternal Aunt      Current Outpatient Medications:    acetaminophen (TYLENOL) 325 MG tablet, Take 650 mg by mouth every 6 (six) hours as needed for moderate pain., Disp: , Rfl:    Anifrolumab-fnia (SAPHNELO IV), Inject into the vein., Disp: , Rfl:    anifrolumab-fnia 300 mg in sodium chloride 0.9 %, Inject 300 mg into the vein once., Disp: , Rfl:    aspirin 81 MG EC tablet, Take 81 mg by mouth daily., Disp: , Rfl:    azaTHIOprine (IMURAN) 50 MG tablet, Take 50 mg by mouth daily. , Disp: , Rfl:    calcium carbonate (OS-CAL - DOSED IN MG OF ELEMENTAL CALCIUM) 1250 (500 Ca) MG tablet, Take 2 tablets by mouth daily., Disp: , Rfl:    cholecalciferol (VITAMIN D3) 25 MCG (1000 UNIT) tablet, Take  1,000 Units by mouth daily., Disp: , Rfl:    Cyanocobalamin (B-12) 1000 MCG TABS, Take 1,000 mcg by mouth daily., Disp: , Rfl:    Folic Acid (FOLATE PO), Take 666 mcg by mouth daily., Disp: , Rfl:    furosemide (LASIX) 20 MG tablet, Take 20 mg by mouth daily., Disp: , Rfl:    Magnesium 250 MG TABS, Take 250 mg by mouth daily., Disp: , Rfl:    metoprolol succinate (TOPROL-XL) 25 MG 24 hr tablet, Take 0.5 tablets (12.5 mg total) by mouth daily., Disp: 45 tablet, Rfl: 3   Multiple Vitamin (MULTIVITAMIN) tablet, Take 2 tablets by mouth daily., Disp: , Rfl:    potassium chloride SA (KLOR-CON M20) 20 MEQ tablet, Take 1 tablet (20 mEq total) by mouth daily., Disp: 180 tablet, Rfl: 3   predniSONE (DELTASONE) 5 MG tablet, Take 5 mg by mouth daily., Disp: , Rfl:    Tiotropium  Bromide-Olodaterol (STIOLTO RESPIMAT) 2.5-2.5 MCG/ACT AERS, Inhale 2 puffs into the lungs daily., Disp: 2 each, Rfl: 0   Tiotropium Bromide-Olodaterol (STIOLTO RESPIMAT) 2.5-2.5 MCG/ACT AERS, Inhale 2 puffs into the lungs daily., Disp: 4 g, Rfl: 11   valACYclovir (VALTREX) 1000 MG tablet, Take 1,000 mg by mouth daily as needed (fever blisters)., Disp: , Rfl:    warfarin (COUMADIN) 5 MG tablet, Take as directed by the anticoagulation clinic., Disp: 135 tablet, Rfl: 3      Objective:   Vitals:   08/19/23 0954  BP: 117/76  Pulse: 82  SpO2: 96%  Weight: 188 lb 6.4 oz (85.5 kg)  Height: 5\' 5"  (1.651 m)    Estimated body mass index is 31.35 kg/m as calculated from the following:   Height as of this encounter: 5\' 5"  (1.651 m).   Weight as of this encounter: 188 lb 6.4 oz (85.5 kg).  @WEIGHTCHANGE @  Filed Weights   08/19/23 0954  Weight: 188 lb 6.4 oz (85.5 kg)     Physical Exam   General: No distress. Looks well O2 at rest: no Cane present: no Sitting in wheel chair: no Frail: no Obese: no Neuro: Alert and Oriented x 3. GCS 15. Speech normal Psych: Pleasant Resp:  Barrel Chest - no.  Wheeze - no, Crackles - no, No overt respiratory distress CVS: Normal heart sounds. Murmurs - no Ext: Stigmata of Connective Tissue Disease - no HEENT: Normal upper airway. PEERL +. No post nasal drip        Assessment:       ICD-10-CM   1. Pulmonary emphysema, unspecified emphysema type (HCC)  J43.9     2. 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 Chronic cough  R05.3     4. Dyspnea on exertion  R06.09     5. Immunosuppression due to drug therapy (HCC)  D84.821    Z79.899     6. History of systemic lupus erythematosus (SLE) (HCC)  M32.9     7. History of obstructive sleep apnea  Z86.69     8. Flu vaccine need  Z23     9. At risk from passive smoking  Z91.89          Plan:     Patient Instructions     ICD-10-CM   1. Pulmonary emphysema, unspecified emphysema type (HCC)   J43.9     2. 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 Chronic cough  R05.3     4. Dyspnea on exertion  R06.09     5. Immunosuppression due to  drug therapy (HCC)  D84.821    Z79.899     6. History of systemic lupus erythematosus (SLE) (HCC)  M32.9     7. History of obstructive sleep apnea  Z86.69     8. Flu vaccine need  Z23     9. At risk from passive smoking  Z91.89        Pulmonary emphysema, unspecified emphysema type (HCC) Tracheobronchomalacia seen on CT chest August 2024 Chronic cough Dyspnea on exertion  -Clinically stable with help of Stiolto. -Pulmonary function tests are stable  Plan - Continue Stiolto scheduled with albuterol as needed  At risk from passive smoking  -CT scan August 2024 without any evidence of lung cancer  Plan - CT scan is indicated for shortness of breath and other issues [Target next 1 in summer 2025  Immunosuppression due to drug therapy (HCC) History of systemic lupus erythematosus (SLE) (HCC)  Plan  - Continue prednisone and Imuran through rheumatology  History of obstructive sleep apnea  Plan  - Continue CPAP through your sleep doctor  Flu vaccine need  Plan - Flu shot today  History of pulmonary hypertension and mitral replacement  -Last echo and visit with Dr. Marca Ancona was February 2024  Plan - Per Dr. Marca Ancona  Follow-up - Return in 6  months; symptom score and simple walking desaturation test at follow-up     FOLLOWUP Return in about 6 months (around 02/16/2024) for 15 min visit, copd, with Dr Marchelle Gearing, Face to Face Visit.    SIGNATURE    Dr. Kalman Shan, M.D., F.C.C.P,  Pulmonary and Critical Care Medicine Staff Physician, [facility] Director - Interstitial Lung Disease  Program  Pulmonary Fibrosis [facility] at [facility] [place], [place], [address]  Pager: [phone], If no answer or between  15:00h - 7:00h: call [phone] Telephone: [phone]  10:40 AM 08/19/2023

## 2023-08-18 NOTE — Telephone Encounter (Signed)
[place] with patient and confirmed appt time and location for 08/22/23 with Dr. Suszanne Conners.  afm

## 2023-08-18 NOTE — Patient Instructions (Addendum)
ICD-10-CM   1. Pulmonary emphysema, unspecified emphysema type (HCC)  J43.9     2. 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 Chronic cough  R05.3     4. Dyspnea on exertion  R06.09     5. Immunosuppression due to drug therapy (HCC)  D84.821    Z79.899     6. History of systemic lupus erythematosus (SLE) (HCC)  M32.9     7. History of obstructive sleep apnea  Z86.69     8. Flu vaccine need  Z23     9. At risk from passive smoking  Z91.89        Pulmonary emphysema, unspecified emphysema type (HCC) Tracheobronchomalacia seen on CT chest August 2024 Chronic cough Dyspnea on exertion  -Clinically stable with help of Stiolto. -Pulmonary function tests are stable  Plan - Continue Stiolto scheduled with albuterol as needed  At risk from passive smoking  -CT scan August 2024 without any evidence of lung cancer  Plan - CT scan is indicated for shortness of breath and other issues [Target next 1 in summer 2025  Immunosuppression due to drug therapy (HCC) History of systemic lupus erythematosus (SLE) (HCC)  Plan  - Continue prednisone and Imuran through rheumatology  History of obstructive sleep apnea  Plan  - Continue CPAP through your sleep doctor  Flu vaccine need  Plan - Flu shot today  History of pulmonary hypertension and mitral replacement  -Last echo and visit with Dr. Marca Ancona was February 2024  Plan - Per Dr. Marca Ancona  Follow-up - Return in 6  months; symptom score and simple walking desaturation test at follow-up

## 2023-08-19 ENCOUNTER — Ambulatory Visit: Payer: BC Managed Care – PPO | Admitting: Internal Medicine

## 2023-08-19 ENCOUNTER — Encounter: Payer: Self-pay | Admitting: Internal Medicine

## 2023-08-19 VITALS — BP 117/76 | HR 82 | Ht 65.0 in | Wt 188.4 lb

## 2023-08-19 DIAGNOSIS — Z79899 Other long term (current) drug therapy: Secondary | ICD-10-CM

## 2023-08-19 DIAGNOSIS — Z9189 Other specified personal risk factors, not elsewhere classified: Secondary | ICD-10-CM

## 2023-08-19 DIAGNOSIS — R053 Chronic cough: Secondary | ICD-10-CM | POA: Diagnosis not present

## 2023-08-19 DIAGNOSIS — Z8669 Personal history of other diseases of the nervous system and sense organs: Secondary | ICD-10-CM

## 2023-08-19 DIAGNOSIS — J398 Other specified diseases of upper respiratory tract: Secondary | ICD-10-CM | POA: Diagnosis not present

## 2023-08-19 DIAGNOSIS — R0609 Other forms of dyspnea: Secondary | ICD-10-CM

## 2023-08-19 DIAGNOSIS — Z23 Encounter for immunization: Secondary | ICD-10-CM

## 2023-08-19 DIAGNOSIS — J439 Emphysema, unspecified: Secondary | ICD-10-CM

## 2023-08-19 DIAGNOSIS — D84821 Immunodeficiency due to drugs: Secondary | ICD-10-CM

## 2023-08-19 DIAGNOSIS — M329 Systemic lupus erythematosus, unspecified: Secondary | ICD-10-CM

## 2023-08-22 ENCOUNTER — Encounter (INDEPENDENT_AMBULATORY_CARE_PROVIDER_SITE_OTHER): Payer: Self-pay

## 2023-08-22 ENCOUNTER — Ambulatory Visit (INDEPENDENT_AMBULATORY_CARE_PROVIDER_SITE_OTHER): Payer: BC Managed Care – PPO

## 2023-08-22 VITALS — Ht 65.0 in | Wt 186.0 lb

## 2023-08-22 DIAGNOSIS — H9042 Sensorineural hearing loss, unilateral, left ear, with unrestricted hearing on the contralateral side: Secondary | ICD-10-CM

## 2023-08-22 DIAGNOSIS — H6123 Impacted cerumen, bilateral: Secondary | ICD-10-CM

## 2023-08-22 DIAGNOSIS — R42 Dizziness and giddiness: Secondary | ICD-10-CM | POA: Diagnosis not present

## 2023-08-23 NOTE — Progress Notes (Signed)
[facility] - Clinic Note  09/06/2023     CHIEF COMPLAINT Patient presents for Retina Follow Up    HISTORY OF PRESENT ILLNESS: Katie [name] is a 57 y.o. female who presents to the clinic today for:   HPI     Retina Follow Up   Patient presents with  Other.  In both eyes.  This started 6 months ago.  I, the attending physician,  performed the HPI with the patient and updated documentation appropriately.        Comments   Patient here for 6 months retina follow up for Vitreomacular adhesion OU. Patient states vision had cataract surgery OU. No eye pain. Using combo drop TID OD. And refresh tears OU prn.      Last edited by Rennis Chris, MD on 09/06/2023 10:50 PM.     Patient feels the vision is doing well.  Referring physician: Fatima Sanger, FNP [address] [place],  [place] [address]  HISTORICAL INFORMATION:   Selected notes from the MEDICAL RECORD NUMBER Referred by Dr. Baker Pierini for concern of plaquenil toxicity LEE:  Ocular Hx- PMH-   CURRENT MEDICATIONS: No current outpatient medications on file. (Ophthalmic Drugs)   No current facility-administered medications for this visit. (Ophthalmic Drugs)   Current Outpatient Medications (Other)  Medication Sig   acetaminophen (TYLENOL) 325 MG tablet Take 650 mg by mouth every 6 (six) hours as needed for moderate pain.   Anifrolumab-fnia (SAPHNELO IV) Inject into the vein.   anifrolumab-fnia 300 mg in sodium chloride 0.9 % Inject 300 mg into the vein once.   aspirin 81 MG EC tablet Take 81 mg by mouth daily.   azaTHIOprine (IMURAN) 50 MG tablet Take 50 mg by mouth daily.    calcium carbonate (OS-CAL - DOSED IN MG OF ELEMENTAL CALCIUM) 1250 (500 Ca) MG tablet Take 2 tablets by mouth daily.   cholecalciferol (VITAMIN D3) 25 MCG (1000 UNIT) tablet Take 1,000 Units by mouth daily.   Cyanocobalamin (B-12) 1000 MCG TABS Take 1,000 mcg by mouth daily.   Folic Acid (FOLATE  PO) Take 666 mcg by mouth daily.   furosemide (LASIX) 20 MG tablet Take 20 mg by mouth daily.   Magnesium 250 MG TABS Take 250 mg by mouth daily.   metoprolol succinate (TOPROL-XL) 25 MG 24 hr tablet Take 0.5 tablets (12.5 mg total) by mouth daily.   Multiple Vitamin (MULTIVITAMIN) tablet Take 2 tablets by mouth daily.   potassium chloride SA (KLOR-CON M20) 20 MEQ tablet Take 1 tablet (20 mEq total) by mouth daily.   predniSONE (DELTASONE) 5 MG tablet Take 5 mg by mouth daily.   Tiotropium Bromide-Olodaterol (STIOLTO RESPIMAT) 2.5-2.5 MCG/ACT AERS Inhale 2 puffs into the lungs daily.   Tiotropium Bromide-Olodaterol (STIOLTO RESPIMAT) 2.5-2.5 MCG/ACT AERS Inhale 2 puffs into the lungs daily.   valACYclovir (VALTREX) 1000 MG tablet Take 1,000 mg by mouth daily as needed (fever blisters).   warfarin (COUMADIN) 5 MG tablet Take as directed by the anticoagulation clinic.   No current facility-administered medications for this visit. (Other)   REVIEW OF SYSTEMS: ROS   Positive for: Musculoskeletal, Cardiovascular, Eyes Negative for: Constitutional, Gastrointestinal, Neurological, Skin, Genitourinary, HENT, Endocrine, Respiratory, Psychiatric, Allergic/Imm, Heme/Lymph Last edited by Laddie Aquas, COA on 09/06/2023  9:50 AM.      ALLERGIES Allergies  Allergen Reactions   Cephalexin Hives   Methotrexate Nausea And Vomiting   PAST MEDICAL HISTORY Past Medical History:  Diagnosis Date  Abscess of right breast    sternal osteomyeliitis, pseudomonas   C. 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    Cataract    Mixed OU   CHF (congestive heart failure) (HCC)    secondary to mmoderate mitral stenosis and moderate mitral regurgitation in the setting of rheumatic vlave disease as well as severe pulmonary HTN likely due to a combination of MV  disease and pulmoanary arterial HTN in the setting of collagen vascular disease. Not valvuloplasty candidate due to MR. patient had MV replacement with mechanical MV in 8/11.   CHF (congestive heart failure) (HCC)    Echo post-op showed normally functioning mechanical MV, EF 60-65% with septal bounce, small mobile structure in the mid ventricle (? loose papillary muscle), RV-RA gradient 22 mmHg. Echo (10/11): EF 60-65%, normally functioning mechanical mitral vlavue.    Hypertensive retinopathy    OU   Mixed connective tissue disease (HCC)    diagnosis 1990. 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 CTA chest (7/10) with no evidence for pulmonary emboli or chronic pulmonary emboli. PA pressure 72/28 mean 50 with PVR 5.7 WU on initial RHC, PA pressure 45/21 mean 30 PVR 2.3 WU on f/u RHC on Revatio 80 mg three times a day (1/11).  After MV surgery, Revatio stopped.   Pulmonary HTN (HCC)    increased  dyspnea. Repeat RHC with mean RA 6, PA 42/20 mean 29, PVR 4.8 WU,  2.3. Revatio 20 mg  3x a day restarted. 3/11 6 min walk 451m. 2/12 6 min walk 427 m.    Raynaud's syndrome    Restrictive lung disease    secondary to MCTD. PFTs 03/06/99 VC 47% DLCO 26%. PFTs 09/12/08 VC 43% DLCO 45%. PFTs 6/10 FVC 37%, FEV1 34%, ratio 69%, TLC 50%. using home O2 periodically   Rheumatic fever    Warfarin anticoagulation    for mechanical MV, goal INR 2.5-3.5   Past Surgical History:  Procedure Laterality Date   BREAST CYST ASPIRATION  03/09/2012   CESAREAN SECTION     COLONOSCOPY     RIGHT HEART CATH N/A 08/10/2021   Procedure: RIGHT HEART CATH;  Surgeon: Laurey Morale, MD;  Location: [facility] INVASIVE CV LAB;  Service: Cardiovascular;  Laterality: N/A;   s/p mitral valvue replacement  05/13/2010   by Dr. [name] by minimally  invasive window.    TUBAL LIGATION     FAMILY HISTORY Family History  Problem Relation Age of Onset   Emphysema Mother        smoker   COPD Mother    Heart disease Maternal Grandmother        side of family not given   Diabetes Maternal Grandmother    Hypertension Father    Osteoarthritis Father    Sleep apnea Father    Breast cancer Maternal Aunt    SOCIAL HISTORY Social History   Tobacco Use   Smoking status: Never   Smokeless tobacco: Never  Vaping Use   Vaping status: Never Used  Substance Use Topics   Alcohol use: No   Drug use: No       OPHTHALMIC EXAM:  Base Eye Exam  Visual Acuity (Snellen - Linear)       Right Left   Dist [place] 20/30 +1 20/20 -1   Dist ph [place] 20/25 -1          Tonometry (Tonopen, 9:47 AM)       Right Left   Pressure 13 13         Pupils       Dark Light Shape React APD   Right 3 2 Round Brisk None   Left 3 2 Round Brisk None         Visual Fields (Counting fingers)       Left Right    Full Full         Extraocular Movement       Right Left    Full, Ortho Full, Ortho         Neuro/Psych     Oriented x3: Yes   Mood/Affect: Normal         Dilation     Both eyes: 1.0% Mydriacyl, 2.5% Phenylephrine @ 9:47 AM           Slit Lamp and Fundus Exam     External Exam       Right Left   External mild accessory lashes upper lid mild accessory lashes upper lid         Slit Lamp Exam       Right Left   Lids/Lashes Dermatochalasis - upper lid, mild Meibomian gland dysfunction Dermatochalasis - upper lid, mild Meibomian gland dysfunction   Conjunctiva/Sclera trace melanosis Mild melanosis   Cornea trace PEE, Well healed cataract wound mild tear film debris, Well healed  cataract wound   Anterior Chamber Deep and clear, narrow temporal angle, No cell or flare Deep and clear, narrow temporal angle, No cell or flare   Iris Round and dilated, PI 1130 Round and dilated, PI 100   Lens PC IOL in good  postition, 1+ central Posterior capsular opacification PC IOL in good postition, trace Posterior capsular opacification   Anterior Vitreous Vitreous syneresis Vitreous syneresis         Fundus Exam       Right Left   Disc Pink and Sharp, +cupping Pink and Sharp, +cupping, mild PPP   C/D Ratio 0.7 0.8   Macula Flat, blunted foveal reflex, RPE mottling and clumping, +small central cyst, No heme Flat, blunted foveal reflex, RPE mottling and clumping, trace cystic changes centrally, No heme   Vessels attenuated, mild tortuosity attenuated, mild tortuosity   Periphery Attached, No heme Attached, no heme           IMAGING AND PROCEDURES  Imaging and Procedures for @TODAY @  OCT, Retina - OU - Both Eyes       Right Eye Quality was good. Central Foveal Thickness: 258. Progression has been stable. Findings include no SRF, abnormal foveal contour, intraretinal fluid, vitreous traction (persistent VMT with central cysts; no ellipsoid loss -- stable from prior).   Left Eye Quality was good. Central Foveal Thickness: 252. Progression has been stable. Findings include no SRF, abnormal foveal contour, intraretinal fluid, vitreous traction (Persistent VMT with small central cysts; no ellipsoid loss -- stable from prior).   Notes *Images captured and stored on drive  Diagnosis / Impression:  OD: persistent VMT with central cysts; no ellipsoid loss -- stable from prior OS: Persistent VMT with small central cysts; no ellipsoid loss -- stable from prior  Clinical management:  See below  Abbreviations: NFP - Normal  foveal profile. CME - cystoid macular edema. PED - pigment epithelial detachment. IRF - intraretinal fluid. SRF - subretinal fluid. EZ - ellipsoid zone. ERM - epiretinal membrane. ORA - outer retinal atrophy. ORT - outer retinal tubulation. SRHM - subretinal hyper-reflective material            ASSESSMENT/PLAN:   ICD-10-CM   1. 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 Long-term use of Plaquenil  Z79.899     3. Essential hypertension  I10     4. Anatomical narrow angle glaucoma  H40.039     5. Hypertensive retinopathy of both eyes  H35.033     6. Pseudophakia of both eyes  Z96.1     7. Bilateral dry eyes  H04.123       1,2. VMT OU - OCT shows OD: persistent VMT with central cysts; no ellipsoid loss -- stable from prior; OS: Persistent VMT with small central cysts; no ellipsoid loss -- stable from prior  - pt denies significant vision changes or metamorphopsia  - BCVA 20/25 OD, 20/20 OS  - discussed findings, prognosis, including possible development of macular hole  - continue amsler grid monitoring  - f/u in 6-9 months, sooner prn -- DFE, OCT  3. History of Long term Plaquenil use  - pt reports history of taking Plaquenil for lupus (200mg  BID) for ~20 years  - pt stopped taking plaquenil beginning of June 2021  - 400 mg/day --> 4.77 mg/kg/day  - pt had VF loss at Dr. Wende Mott office consistent with toxic maculopathy - OCT today shows ?tr parafoveal ellipsoid thinning OU -- no frank EZ loss -- no significant change from prior - discussed findings, prognosis and possibility of progression despite discontinuation of medication  - monitor  - f/u in 6-9 months, DFE, OCT  4. Hypertensive retinopathy OU  - discussed importance of tight BP control  - continue to monitor  5. Narrow angle glaucoma OU  - IOP today: 13 OU  - patent PI's OU  - under the expert management of Dr. Zenaida Niece  6. Pseudophakia OU  - s/p CE/IOL, Dr. Zenaida Niece (OD 11.12.24, OS 10.01.24)  - IOL in good position, doing well  - monitor   7. Dry eyes OU - recommend artificial tears and lubricating ointment as needed   Ophthalmic Meds Ordered this visit:  No orders of the defined types were placed in this encounter.    Return in about 9 months (around 06/05/2024) for f/u VMT OU , DFE, OCT.  There are no Patient Instructions on file for this  visit.  This document serves as a record of services personally performed by Karie Chimera, MD, PhD. It was created on their behalf by Charlette Caffey, COT an ophthalmic technician. The creation of this record is the provider's dictation and/or activities during the visit.    Electronically signed by:  Charlette Caffey, COT  09/06/23 10:51 PM  Karie Chimera, M.D., Ph.D. Diseases & Surgery of the Retina and Vitreous [facility]  I have reviewed the above documentation for accuracy and completeness, and I agree with the above. Karie Chimera, M.D., Ph.D. 09/06/23 10:52 PM   Abbreviations: M myopia (nearsighted); A astigmatism; H hyperopia (farsighted); P presbyopia; Mrx spectacle prescription;  CTL contact lenses; OD right eye; OS left eye; OU both eyes  XT exotropia; ET esotropia; PEK punctate epithelial keratitis; PEE punctate epithelial erosions; DES dry  eye syndrome; MGD meibomian gland dysfunction; ATs artificial tears; PFAT's preservative free artificial tears; NSC nuclear sclerotic cataract; PSC posterior subcapsular cataract; ERM epi-retinal membrane; PVD posterior vitreous detachment; RD retinal detachment; DM diabetes mellitus; DR diabetic retinopathy; NPDR non-proliferative diabetic retinopathy; PDR proliferative diabetic retinopathy; CSME clinically significant macular edema; DME diabetic macular edema; dbh dot blot hemorrhages; CWS cotton wool spot; POAG primary open angle glaucoma; C/D cup-to-disc ratio; HVF humphrey visual field; GVF goldmann visual field; OCT optical coherence tomography; IOP intraocular pressure; BRVO Branch retinal vein occlusion; CRVO central retinal vein occlusion; CRAO central retinal artery occlusion; BRAO branch retinal artery occlusion; RT retinal tear; SB scleral buckle; PPV pars plana vitrectomy; VH Vitreous hemorrhage; PRP panretinal laser photocoagulation; IVK intravitreal kenalog; VMT vitreomacular traction; MH Macular hole;   NVD neovascularization of the disc; NVE neovascularization elsewhere; AREDS age related eye disease study; ARMD age related macular degeneration; POAG primary open angle glaucoma; EBMD epithelial/anterior basement membrane dystrophy; ACIOL anterior chamber intraocular lens; IOL intraocular lens; PCIOL posterior chamber intraocular lens; Phaco/IOL phacoemulsification with intraocular lens placement; PRK photorefractive keratectomy; LASIK laser assisted in situ keratomileusis; HTN hypertension; DM diabetes mellitus; COPD chronic obstructive pulmonary disease

## 2023-08-24 DIAGNOSIS — R42 Dizziness and giddiness: Secondary | ICD-10-CM | POA: Insufficient documentation

## 2023-08-24 DIAGNOSIS — H9042 Sensorineural hearing loss, unilateral, left ear, with unrestricted hearing on the contralateral side: Secondary | ICD-10-CM | POA: Insufficient documentation

## 2023-08-24 DIAGNOSIS — H6123 Impacted cerumen, bilateral: Secondary | ICD-10-CM | POA: Insufficient documentation

## 2023-08-24 NOTE — Progress Notes (Signed)
Patient ID: Katie [name], female   DOB: October 16, 1965, 57 y.o.   MRN: [id number]  Follow-up: Recurrent dizziness, left ear hearing loss  HPI: The patient is a 57 year old female who returns today for her follow-up evaluation.  The patient was previously seen for recurrent dizziness.  She has been experiencing recurrent dizziness since 2017.  Her symptoms include recurrent spinning vertigo, asymmetric left ear low-frequency sensorineural hearing loss, left aural pressure, and left ear tinnitus.  She was diagnosed with left ear Mnire's disease.  She was treated with low-salt diet and Lasix diuretic.  The patient returns today reporting normal spinning vertigo.  She still has occasional off-balance sensation.  She also complains of persistent left ear pressure.  She denies any otorrhea.  Her left ear hearing is stable.  Exam: General: Communicates without difficulty, well nourished, no acute distress. Head: Normocephalic, no evidence injury, no tenderness, facial buttresses intact without stepoff. Face/sinus: No tenderness to palpation and percussion. Facial movement is normal and symmetric. Eyes: PERRL, EOMI. No scleral icterus, conjunctivae clear. Neuro: CN II exam reveals vision grossly intact.  No nystagmus at any point of gaze. EAC: Bilateral cerumen impaction.  Under the operating microscope, the cerumen is carefully removed with a combination of cerumen currette, alligator forceps, and suction catheters.  After the cerumen is removed, the TMs are noted to be normal.  Nose: External evaluation reveals normal support and skin without lesions.  Dorsum is intact.  Anterior rhinoscopy reveals pink mucosa over anterior aspect of inferior turbinates and intact septum.  No purulence noted. Oral:  Oral cavity and oropharynx are intact, symmetric, without erythema or edema.  Mucosa is moist without lesions. Neck: Full range of motion without pain.  There is no significant lymphadenopathy.  No masses palpable.   Thyroid bed within normal limits to palpation.  Parotid glands and submandibular glands equal bilaterally without mass.  Trachea is midline. Neuro:  CN 2-12 grossly intact. Gait wide-based. Vestibular: No nystagmus at any point of gaze. Dix Hallpike negative. Vestibular: There is no nystagmus with pneumatic pressure on either tympanic membrane or Valsalva. The cerebellar examination is unremarkable.   Assessment 1.  Recurrent dizziness, possibly a result of her left ear Mnire's disease. 2.  Bilateral cerumen impaction. 3.  Fluctuating left ear hearing loss.  Currently she denies any hearing difficulty. 4.  The patient's ear canals, tympanic membranes, and middle ear spaces are all normal.   Plan 1.  Otomicroscopy with bilateral cerumen disimpaction. 2.  The physical exam findings of the hearing test results are reviewed with the patient. 3.  Continue with the 1500 mg low-salt diet. 4.  The patient will return for reevaluation in 6 months, sooner if needed.

## 2023-09-06 ENCOUNTER — Ambulatory Visit (INDEPENDENT_AMBULATORY_CARE_PROVIDER_SITE_OTHER): Payer: BC Managed Care – PPO | Admitting: Ophthalmology

## 2023-09-06 ENCOUNTER — Encounter (INDEPENDENT_AMBULATORY_CARE_PROVIDER_SITE_OTHER): Payer: Self-pay | Admitting: Ophthalmology

## 2023-09-06 DIAGNOSIS — Z79899 Other long term (current) drug therapy: Secondary | ICD-10-CM | POA: Diagnosis not present

## 2023-09-06 DIAGNOSIS — I10 Essential (primary) hypertension: Secondary | ICD-10-CM

## 2023-09-06 DIAGNOSIS — H35033 Hypertensive retinopathy, bilateral: Secondary | ICD-10-CM

## 2023-09-06 DIAGNOSIS — H25813 Combined forms of age-related cataract, bilateral: Secondary | ICD-10-CM

## 2023-09-06 DIAGNOSIS — Z961 Presence of intraocular lens: Secondary | ICD-10-CM

## 2023-09-06 DIAGNOSIS — H04123 Dry eye syndrome of bilateral lacrimal glands: Secondary | ICD-10-CM

## 2023-09-06 DIAGNOSIS — H40039 Anatomical narrow angle, unspecified eye: Secondary | ICD-10-CM

## 2023-09-06 DIAGNOSIS — H43823 Vitreomacular adhesion, bilateral: Secondary | ICD-10-CM

## 2023-09-13 NOTE — Progress Notes (Signed)
Patient ID: Katie [name], female   DOB: Aug 06, 1966, 57 y.o.   MRN: [id number] PCP: Dr. Renne Crigler Cardiology: Dr . [name]  Chief Complaint: Pulmonary Hypertension  57 y.o. 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    Echo in 1/19 showed EF 60-65%, mechanical mitral valve with mean gradient 5 mmHg, and PA systolic pressure 33 mmHg with normal RV.  Echo in 8/20 showed EF 50-55%, normal RV, mechanical MV with mean gradient 6 mmHg, unable to estimate PA systolic pressure, normal RV.  Echo in 11/21 showed EF 60-65%, RV probably normal, PASP 25 mmHg, mechanical MV with mean gradient 5 mmHg.   RHC was done in 10/22 after stopping sildenafil. This showed normal filling pressures and normal PA pressure.   Echo showed EF 55-60%, mildly decreased RV systolic function, PASP 33 mmHg, mechanical mitral valve with no MR and stable mean gradient 5 mmHg.   Today she returns for pulmonary hypertension follow up.Overall feeling fine but has some joint pain. Denies SOB/PND/Orthopnea. Able to walk up steps. Uses CPAP every night.  Appetite ok. No fever or chills. Weight at home trending up but she feels like its due to menopause. Taking all medications. Takes lasix as needed.  Retired in October  2024.   Labs (8/11): HCT 27 => 30, creatinine 0.98, INR 4 Labs (9/11): K 4.6, creatinine 0.9, HCT 32.7, BNP 214=>146, LFTs normal, TSH normal Labs (1/12): HCT 37.9, K 3.5, creatinine 0.9 Labs (4/12): LDL 108, HDL 43, TSH normal, K 4.1, creatinine 0.8, HCT 37.9 Labs (1/13): K 3.8, creatinine 0.8 Labs (11/14): K 3.6, creatinine 0.71, LDL 94, HCT 38 Labs (12/15): Hgb 12.5 Labs (2/16): K 3.9, creatinine 0.84 Labs (8/16): K 3.9, creatinine 0.87, HCT 38.6 Labs  (4/17): K 3.6, creatinine 0.88, LDL 92, HDL 38, hgb 11.7 Labs (10/17): K 3.6, creatinine 0.81, hgb 12.1 Labs (1/20): K 4.3, creatinine 0.79, LDL 107, hgb 12 Labs (6/21): creatinine 0.7 Labs (7/22): BNP 24, K 4.3, creatinine 0.78 Labs (9/22): BNP 103 Labs (10/22): K 4.4, creatinine 0.75, hgb 13.3 Labs (3/23): LDL 114, K 4, creatinine 0.7 Labs (6/23): K 3.6, creatinine 0.6, hgb 12.4 Labs (11/15/2022): K 3.7 Creatinine 0.8  Allergies (verified):  1)  ! Methotrexate  Past Medical History: 1. Mixed connective tissue disease: diagnosis 1990. 8/10 ANA positive, anti-dsDNA positive, anti-SCL70 negative 2. 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     - PFT's 03/06/99  VC 47%  DLC0 26%    - PFT's 09/12/08  VC 43%  DLC0 45%    - PFT's 6/10 FVC 37%, FEV1 34%, ratio 69%, TLC 50%    - PFTs 11/15 FVC 39%, FEV1 35%, ratio 89%, TLC 63%, DLCO 48%    - CT chest in 2/22 showed emphysema but no ILD.  4. Nephrotic syndrome: secondary to MCTD 5. CHF: Secondary to moderate mitral stenosis and moderate mitral regurgitation in the setting of rheumatic mitral valve disease as well as severe pulmonary HTN likely due to a combination of MV disease and pulmonary arterial HTN in the setting of collagen vascular disease.   Not valvuloplasty candidate due to MR.  Patient had MV replacement with mechanical MV in 8/11.  Echo (8/11) post-op showed normally functioning mechanical MV, EF 60-65% with septal bounce, small mobile structure  in the mid ventricle (? loose papillary muscle), RV-RA gradient 22 mmHg.  Echo (10/11): EF 60-65%, normally functioning mechanical mitral valve.  Echo (7/13): EF 55-60%, mechanical mitral valve with mean gradient 4 mmHg, PA systolic pressure 29 mmHg. Echo (5/15) with EF 55-60%, mechanical mitral valve functioning normally, normal RV size and systolic function, PA systolic pressure 32 mmHg.  - Echo in 5/17 showed EF 60-65%, normal mechanical mitral  valve, and PA systolic pressure 33 mmHg with normal RV.  - Echo (1/19): EF 60-65%, mild LVH, normal RV size and systolic function, mechanical mitral valve with mean gradient 6 mmHg, PASP 33 mmHg.  - Echo (8/20): EF 50-55%, normal RV, mechanical MV with mean gradient 6 mmHg, unable to estimate PA systolic pressure, normal RV.  No change from prior.  - Echo (11/21): EF 60-65%, RV probably normal, PASP 25 mmHg, mechanical MV with mean gradient 5 mmHg. - Echo (2/4): EF 55-60%, mildly decreased RV systolic function, PASP 33 mmHg, mechanical mitral valve with no MR and stable mean gradient 5 mmHg.  6.  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  CTA chest (7/10) with no evidence for pulmonary emboli or chronic pulmonary emboli.  PA pressure 72/28 mean 50 with PVR 5.7 WU on initial RHC, PA pressure 45/21 mean 30 PVR 2.3 WU on f/u RHC on Revatio 80 mg three times a day (1/11).  After MV surgery, Revatio stopped.  Increased dyspnea.  Repeat RHC with mean RA 6, PA 42/20 (mean 29), PVR 4.8 WU, CI 2.3.  Revatio 20 mg three times a day restarted.  Echo (7/13) with PA systolic pressure 29 mmHg. Echo (5/15) with PA systolic pressure 32 mmHg.  - RHC (10/22): mean RA 1, PA 28/8 mean 18, mean PCWP 7, CI 2.83, PVR 2 WU.  7.  Rheumatic fever 8.  LHC (9/10): no angiographic CAD.  9.  Warfarin anticoagulation for mechanical MV, goal INR 2.5-3.5 10.  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  11. 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 C difficile diarrhea 1/12 13. GERD 14. Holter (1/19): No significant arrhythmias.  15. COPD: CT chest in 2/22 showed emphysema but no ILD.  Suspect due to passive smoking.   Family History: Emphysema mother, smoker.  Grandmother with "heart disease"  Social History: Never smoked Retired school bus driver Has son born in 4/09.    ROS: All systems reviewed and negative except as noted in HPI.   Current Outpatient Medications  Medication Sig Dispense Refill   acetaminophen (TYLENOL) 325 MG tablet Take 650 mg by mouth every 6 (six) hours as needed for moderate pain.     Anifrolumab-fnia (SAPHNELO IV) Inject into the vein.     anifrolumab-fnia 300 mg in sodium chloride 0.9 % Inject 300 mg into the vein once.     aspirin 81 MG EC tablet Take 81 mg by mouth daily.     azaTHIOprine (IMURAN) 50 MG tablet Take 50 mg by mouth daily.      calcium carbonate (OS-CAL - DOSED IN MG OF ELEMENTAL CALCIUM) 1250 (500 Ca) MG tablet Take 2 tablets by mouth daily.     cholecalciferol (VITAMIN D3) 25 MCG (1000 UNIT) tablet Take 1,000 Units by mouth daily.     Cyanocobalamin (B-12) 1000 MCG TABS Take 1,000 mcg by mouth daily.     Folic Acid (FOLATE PO) Take 666 mcg by mouth daily.  furosemide (LASIX) 20 MG tablet Take 20 mg by mouth as needed for fluid.     Magnesium 250 MG TABS Take 250 mg by mouth daily.     metoprolol succinate (TOPROL-XL) 25 MG 24 hr tablet Take 0.5 tablets (12.5 mg total) by mouth daily. 45 tablet 3   Multiple Vitamin (MULTIVITAMIN) tablet Take 2 tablets by mouth daily.     potassium chloride SA (KLOR-CON M20) 20 MEQ tablet Take 1 tablet (20 mEq total) by mouth daily. 180 tablet 3   predniSONE (DELTASONE) 5 MG tablet Take 5 mg by mouth daily.     Tiotropium Bromide-Olodaterol (STIOLTO RESPIMAT) 2.5-2.5 MCG/ACT AERS Inhale 2 puffs into the lungs daily. 2 each 0   Tiotropium Bromide-Olodaterol (STIOLTO RESPIMAT) 2.5-2.5 MCG/ACT AERS Inhale 2 puffs into the lungs daily. 4 g 11   valACYclovir (VALTREX) 1000 MG tablet Take 1,000 mg by mouth daily as needed (fever blisters).     warfarin (COUMADIN) 5 MG tablet Take as directed by the anticoagulation clinic. 135 tablet 3   No current facility-administered medications for this encounter.    BP 108/64   Pulse 84   Wt 86.7 kg (191 lb 3.2 oz)   SpO2 94%   BMI 31.82  kg/m  Wt Readings from Last 3 Encounters:  09/14/23 86.7 kg (191 lb 3.2 oz)  08/22/23 84.4 kg (186 lb)  08/19/23 85.5 kg (188 lb 6.4 oz)    General:  Well appearing. No resp difficulty HEENT: normal Neck: supple. no JVD. Carotids 2+ bilat; no bruits. No lymphadenopathy or thryomegaly appreciated. Cor: PMI nondisplaced. Regular rate & rhythm. No rubs, gallops or murmurs. Lungs: clear Abdomen: soft, nontender, nondistended. No hepatosplenomegaly. No bruits or masses. Good bowel sounds. Extremities: no cyanosis, clubbing, rash, edema Neuro: alert & orientedx3, cranial nerves grossly intact. moves all 4 extremities w/o difficulty. Affect pleasant  EKG: NSR 83  RBBB   Assessment/Plan:  1. Status post mechanical MVR:  Mitral valve replacement, stable from valve standpoint. Echo 11/2022 showed mean gradient 5 mmHg (stable) without significant MR.  She is on coumadin goal INR 2.5 - 3.5 and ASA 81 mg daily. INR followed at [facility].   No bleeding issues.  2.  Pulmonary HTN:  Pulmonary HTN is likely a mixed picture, from mitral valve disease as well as mixed connective tissue disease.  She had mild residual pulmonary HTN with moderately increased PVR even after mitral valve surgery on last RHC though this was improved compared to initial RHC.  The residual pulmonary HTN may be due to pulmonary vascular remodeling with chronic left atrial pressure elevation in the setting of mitral valve disease. Revatio was restarted at 20 mg tid.  Echo in 8/20 showed RV normal but unable to estimate PA systolic pressure.  Echo in 11/21 showed normal RV with PASP 25 mm Hg.   She is now off sildenafil.  RHC in 10/22 showed normal filling pressures and normal PA pressure (off sildenafil). Estimated PA systolic pressure on today's echo was 33 mmHg. - NYHA I. Volume status stable. Can use lasix as needed.   - Now off sildenafil.   - Continue CPAP for OSA.  3. Mixed connective tissue disorder: Patient has history  of pulmonary fibrosis, but most recent CT in 2/22 showed emphysema and no significant fibrosis. Followed by rheumatology at [facility]. Unable to come off prednisone.  - On prednisone and azathioprine.  4. COPD: Emphysema on 2/22 CT chest.  Thought to be due to passive smoking.  -  She will continue Spiriva, has had symptomatic improvement.  5. OSA: Continue CPAP nightly.    Follow up 6 months with Dr [name] and an ECHO   Lora Glomski NP-C  9:04 AM

## 2023-09-14 ENCOUNTER — Ambulatory Visit (HOSPITAL_COMMUNITY)
Admission: RE | Admit: 2023-09-14 | Discharge: 2023-09-14 | Disposition: A | Discharge status: Home / Self Care | Payer: BC Managed Care – PPO | Source: Ambulatory Visit | Attending: Adult Health | Admitting: Adult Health

## 2023-09-14 ENCOUNTER — Encounter (HOSPITAL_COMMUNITY): Payer: Self-pay

## 2023-09-14 VITALS — BP 108/64 | HR 84 | Wt 191.2 lb

## 2023-09-14 DIAGNOSIS — Z7901 Long term (current) use of anticoagulants: Secondary | ICD-10-CM | POA: Diagnosis not present

## 2023-09-14 DIAGNOSIS — I059 Rheumatic mitral valve disease, unspecified: Secondary | ICD-10-CM

## 2023-09-14 DIAGNOSIS — G4733 Obstructive sleep apnea (adult) (pediatric): Secondary | ICD-10-CM | POA: Diagnosis not present

## 2023-09-14 DIAGNOSIS — I272 Pulmonary hypertension, unspecified: Secondary | ICD-10-CM | POA: Insufficient documentation

## 2023-09-14 DIAGNOSIS — I052 Rheumatic mitral stenosis with insufficiency: Secondary | ICD-10-CM | POA: Insufficient documentation

## 2023-09-14 DIAGNOSIS — J439 Emphysema, unspecified: Secondary | ICD-10-CM | POA: Insufficient documentation

## 2023-09-14 DIAGNOSIS — M2550 Pain in unspecified joint: Secondary | ICD-10-CM | POA: Insufficient documentation

## 2023-09-14 DIAGNOSIS — J8410 Pulmonary fibrosis, unspecified: Secondary | ICD-10-CM | POA: Insufficient documentation

## 2023-09-14 DIAGNOSIS — F17210 Nicotine dependence, cigarettes, uncomplicated: Secondary | ICD-10-CM | POA: Insufficient documentation

## 2023-09-14 DIAGNOSIS — Z7982 Long term (current) use of aspirin: Secondary | ICD-10-CM | POA: Diagnosis not present

## 2023-09-14 DIAGNOSIS — Z79899 Other long term (current) drug therapy: Secondary | ICD-10-CM | POA: Insufficient documentation

## 2023-09-14 DIAGNOSIS — I2729 Other secondary pulmonary hypertension: Secondary | ICD-10-CM | POA: Diagnosis not present

## 2023-09-14 DIAGNOSIS — Z79624 Long term (current) use of inhibitors of nucleotide synthesis: Secondary | ICD-10-CM | POA: Diagnosis not present

## 2023-09-14 DIAGNOSIS — Z952 Presence of prosthetic heart valve: Secondary | ICD-10-CM | POA: Insufficient documentation

## 2023-09-14 DIAGNOSIS — I5022 Chronic systolic (congestive) heart failure: Secondary | ICD-10-CM | POA: Diagnosis present

## 2023-09-14 NOTE — Patient Instructions (Signed)
Medication Changes:  No Changes In Medications at this time.   Follow-Up in: 6 months with an Echo PLEASE CALL OUR OFFICE AROUND APRIL 2025 TO GET SCHEDULED FOR YOUR APPOINTMENT. PHONE NUMBER IS [phone] OPTION 2   At the [facility], you and your health needs are our priority. We have a designated team specialized in the treatment of Heart Failure. This Care Team includes your primary Heart Failure Specialized Cardiologist (physician), Advanced Practice Providers (APPs- Physician Assistants and Nurse Practitioners), and Pharmacist who all work together to provide you with the care you need, when you need it.   You may see any of the following providers on your designated Care Team at your next follow up:  Dr. Arvilla Meres Dr. Marca Ancona Dr. Dorthula Nettles Dr. Theresia Bough Tonye Becket, NP Robbie Lis, [place] [facility] [place], [place] Brynda Peon, NP [place] Lee, NP Karle Plumber, PharmD   Please be sure to bring in all your medications bottles to every appointment.   Need to Contact us:  If you have any questions or concerns before your next appointment please send [place] a message through [place] or call our office at [phone].    TO LEAVE A MESSAGE FOR THE NURSE SELECT OPTION 2, PLEASE LEAVE A MESSAGE INCLUDING: YOUR NAME DATE OF BIRTH CALL BACK NUMBER REASON FOR CALL**this is important as we prioritize the call backs  YOU WILL RECEIVE A CALL BACK THE SAME DAY AS LONG AS YOU CALL BEFORE 4:00 PM

## 2023-11-08 NOTE — Progress Notes (Unsigned)
Aleen Sells D.[facility] [address] [place] [address] Phone: [phone]   Assessment and Plan:     There are no diagnoses linked to this encounter.  ***   Pertinent previous records reviewed include ***    Follow Up: ***     Subjective:   I, Katie [name], am serving as a Neurosurgeon for Doctor Richardean Sale   Chief Complaint: right bicep tear with hematoma   HPI:    01/14/22 Patient is a 58 year old female complaining of right bicep pain. Patient states cant extender her arm has lots of bruising , 2 weeks ago she thinks she bumped into something and then the bruising came and the bruising became worst over the week, no numbness or tingling , has been taking tylenol, decreased ROM,    01/19/2022 Patient states that the pain is subsiding and the swelling has gone down    02/16/2022 Patient states that she is wonderful   11/09/2023 Patient states   Relevant Historical Information: Complicated past medical history including pulmonary hypertension, cardiomyopathies, CAD, CHF, lupus, warfarin anticoagulation  Additional pertinent review of systems negative.   Current Outpatient Medications:    acetaminophen (TYLENOL) 325 MG tablet, Take 650 mg by mouth every 6 (six) hours as needed for moderate pain., Disp: , Rfl:    Anifrolumab-fnia (SAPHNELO IV), Inject into the vein., Disp: , Rfl:    anifrolumab-fnia 300 mg in sodium chloride 0.9 %, Inject 300 mg into the vein once., Disp: , Rfl:    aspirin 81 MG EC tablet, Take 81 mg by mouth daily., Disp: , Rfl:    azaTHIOprine (IMURAN) 50 MG tablet, Take 50 mg by mouth daily. , Disp: , Rfl:    calcium carbonate (OS-CAL - DOSED IN MG OF ELEMENTAL CALCIUM) 1250 (500 Ca) MG tablet, Take 2 tablets by mouth daily., Disp: , Rfl:    cholecalciferol (VITAMIN D3) 25 MCG (1000 UNIT) tablet, Take 1,000 Units by mouth daily., Disp: , Rfl:    Cyanocobalamin (B-12) 1000 MCG TABS, Take 1,000 mcg by mouth  daily., Disp: , Rfl:    Folic Acid (FOLATE PO), Take 666 mcg by mouth daily., Disp: , Rfl:    furosemide (LASIX) 20 MG tablet, Take 20 mg by mouth as needed for fluid., Disp: , Rfl:    Magnesium 250 MG TABS, Take 250 mg by mouth daily., Disp: , Rfl:    metoprolol succinate (TOPROL-XL) 25 MG 24 hr tablet, Take 0.5 tablets (12.5 mg total) by mouth daily., Disp: 45 tablet, Rfl: 3   Multiple Vitamin (MULTIVITAMIN) tablet, Take 2 tablets by mouth daily., Disp: , Rfl:    potassium chloride SA (KLOR-CON M20) 20 MEQ tablet, Take 1 tablet (20 mEq total) by mouth daily., Disp: 180 tablet, Rfl: 3   predniSONE (DELTASONE) 5 MG tablet, Take 5 mg by mouth daily., Disp: , Rfl:    Tiotropium Bromide-Olodaterol (STIOLTO RESPIMAT) 2.5-2.5 MCG/ACT AERS, Inhale 2 puffs into the lungs daily., Disp: 2 each, Rfl: 0   Tiotropium Bromide-Olodaterol (STIOLTO RESPIMAT) 2.5-2.5 MCG/ACT AERS, Inhale 2 puffs into the lungs daily., Disp: 4 g, Rfl: 11   valACYclovir (VALTREX) 1000 MG tablet, Take 1,000 mg by mouth daily as needed (fever blisters)., Disp: , Rfl:    warfarin (COUMADIN) 5 MG tablet, Take as directed by the anticoagulation clinic., Disp: 135 tablet, Rfl: 3   Objective:     There were no vitals filed for this visit.    There is no  height or weight on file to calculate BMI.    Physical Exam:    ***   Electronically signed by:  Aleen Sells D[facility] 9:21 AM 11/08/23

## 2023-11-09 ENCOUNTER — Ambulatory Visit: Payer: 59 | Admitting: Sports Medicine

## 2023-11-09 VITALS — BP 120/84 | Ht 65.0 in | Wt 192.0 lb

## 2023-11-09 DIAGNOSIS — M25511 Pain in right shoulder: Secondary | ICD-10-CM | POA: Diagnosis not present

## 2023-11-09 DIAGNOSIS — M7551 Bursitis of right shoulder: Secondary | ICD-10-CM | POA: Diagnosis not present

## 2023-11-09 NOTE — Patient Instructions (Signed)
Shoulder HEP 4 week follow up

## 2023-11-23 ENCOUNTER — Other Ambulatory Visit (HOSPITAL_COMMUNITY): Payer: Self-pay | Admitting: Cardiology

## 2023-11-23 MED ORDER — POTASSIUM CHLORIDE CRYS ER 20 MEQ PO TBCR: 20.0000 meq | EXTENDED_RELEASE_TABLET | Freq: Every day | ORAL | 3 refills | Status: DC

## 2023-12-06 NOTE — Progress Notes (Unsigned)
 Aleen Sells D.[facility] [address] [place] [address] Phone: [phone]   Assessment and Plan:     There are no diagnoses linked to this encounter.  ***   Pertinent previous records reviewed include ***    Follow Up: ***     Subjective:   I, Lauren Modisette, am serving as a Neurosurgeon for Doctor Richardean Sale   Chief Complaint: right bicep tear with hematoma   HPI:    01/14/22 Patient is a 58 year old female complaining of right bicep pain. Patient states cant extender her arm has lots of bruising , 2 weeks ago she thinks she bumped into something and then the bruising came and the bruising became worst over the week, no numbness or tingling , has been taking tylenol, decreased ROM,    01/19/2022 Patient states that the pain is subsiding and the swelling has gone down    02/16/2022 Patient states that she is wonderful    11/09/2023 Patient states that she has right shoulder pain. Pain radiates down her arm. Decreased ROM . Pain for about 3 weeks. No MOI. Tylenol for the pain and that helps a little. Pain is worse at night. She is a side sleeper and not able to sleep through the night. No numbness or tingling   12/07/2023 Patient states   Relevant Historical Information: Complicated past medical history including pulmonary hypertension, cardiomyopathies, CAD, CHF, lupus, warfarin anticoagulation  Additional pertinent review of systems negative.   Current Outpatient Medications:    acetaminophen (TYLENOL) 325 MG tablet, Take 650 mg by mouth every 6 (six) hours as needed for moderate pain., Disp: , Rfl:    Anifrolumab-fnia (SAPHNELO IV), Inject into the vein., Disp: , Rfl:    anifrolumab-fnia 300 mg in sodium chloride 0.9 %, Inject 300 mg into the vein once., Disp: , Rfl:    aspirin 81 MG EC tablet, Take 81 mg by mouth daily., Disp: , Rfl:    azaTHIOprine (IMURAN) 50 MG tablet, Take 50 mg by mouth daily. , Disp: , Rfl:     calcium carbonate (OS-CAL - DOSED IN MG OF ELEMENTAL CALCIUM) 1250 (500 Ca) MG tablet, Take 2 tablets by mouth daily., Disp: , Rfl:    cholecalciferol (VITAMIN D3) 25 MCG (1000 UNIT) tablet, Take 1,000 Units by mouth daily., Disp: , Rfl:    Cyanocobalamin (B-12) 1000 MCG TABS, Take 1,000 mcg by mouth daily., Disp: , Rfl:    Folic Acid (FOLATE PO), Take 666 mcg by mouth daily., Disp: , Rfl:    furosemide (LASIX) 20 MG tablet, Take 20 mg by mouth as needed for fluid., Disp: , Rfl:    Magnesium 250 MG TABS, Take 250 mg by mouth daily., Disp: , Rfl:    metoprolol succinate (TOPROL-XL) 25 MG 24 hr tablet, Take 0.5 tablets (12.5 mg total) by mouth daily., Disp: 45 tablet, Rfl: 3   Multiple Vitamin (MULTIVITAMIN) tablet, Take 2 tablets by mouth daily., Disp: , Rfl:    potassium chloride SA (KLOR-CON M20) 20 MEQ tablet, Take 1 tablet (20 mEq total) by mouth daily., Disp: 90 tablet, Rfl: 3   predniSONE (DELTASONE) 5 MG tablet, Take 5 mg by mouth daily., Disp: , Rfl:    Tiotropium Bromide-Olodaterol (STIOLTO RESPIMAT) 2.5-2.5 MCG/ACT AERS, Inhale 2 puffs into the lungs daily., Disp: 2 each, Rfl: 0   Tiotropium Bromide-Olodaterol (STIOLTO RESPIMAT) 2.5-2.5 MCG/ACT AERS, Inhale 2 puffs into the lungs daily., Disp: 4 g, Rfl: 11   valACYclovir (  VALTREX) 1000 MG tablet, Take 1,000 mg by mouth daily as needed (fever blisters)., Disp: , Rfl:    warfarin (COUMADIN) 5 MG tablet, Take as directed by the anticoagulation clinic., Disp: 135 tablet, Rfl: 3   Objective:     There were no vitals filed for this visit.    There is no height or weight on file to calculate BMI.    Physical Exam:    ***   Electronically signed by:  Aleen Sells [facility] 7:29 AM 12/06/23

## 2023-12-07 ENCOUNTER — Ambulatory Visit (INDEPENDENT_AMBULATORY_CARE_PROVIDER_SITE_OTHER): Payer: 59 | Admitting: Sports Medicine

## 2023-12-07 VITALS — BP 120/84 | Ht 65.0 in | Wt 192.0 lb

## 2023-12-07 DIAGNOSIS — M7551 Bursitis of right shoulder: Secondary | ICD-10-CM

## 2023-12-07 DIAGNOSIS — M25511 Pain in right shoulder: Secondary | ICD-10-CM

## 2023-12-14 ENCOUNTER — Other Ambulatory Visit (HOSPITAL_COMMUNITY): Payer: Self-pay | Admitting: Cardiology

## 2024-01-11 ENCOUNTER — Other Ambulatory Visit: Payer: Self-pay | Admitting: Registered Nurse

## 2024-01-11 DIAGNOSIS — N6320 Unspecified lump in the left breast, unspecified quadrant: Secondary | ICD-10-CM

## 2024-01-30 ENCOUNTER — Ambulatory Visit
Admission: RE | Admit: 2024-01-30 | Discharge: 2024-01-30 | Disposition: A | Discharge status: Home / Self Care | Source: Ambulatory Visit | Attending: Registered Nurse | Admitting: Registered Nurse

## 2024-01-30 DIAGNOSIS — N6320 Unspecified lump in the left breast, unspecified quadrant: Secondary | ICD-10-CM

## 2024-02-01 ENCOUNTER — Other Ambulatory Visit: Payer: Self-pay | Admitting: Registered Nurse

## 2024-02-01 DIAGNOSIS — N6320 Unspecified lump in the left breast, unspecified quadrant: Secondary | ICD-10-CM

## 2024-02-16 ENCOUNTER — Telehealth: Payer: Self-pay | Admitting: *Deleted

## 2024-02-16 NOTE — Telephone Encounter (Signed)
 LVM to confirm appt for 5/12 - included address and time - [facility] 02/16/24

## 2024-02-20 ENCOUNTER — Ambulatory Visit (INDEPENDENT_AMBULATORY_CARE_PROVIDER_SITE_OTHER): Payer: BC Managed Care – PPO | Admitting: Otolaryngology

## 2024-02-20 VITALS — BP 127/87 | HR 90 | Ht 65.0 in | Wt 194.0 lb

## 2024-02-20 DIAGNOSIS — H9312 Tinnitus, left ear: Secondary | ICD-10-CM

## 2024-02-20 DIAGNOSIS — R42 Dizziness and giddiness: Secondary | ICD-10-CM

## 2024-02-20 DIAGNOSIS — H8102 Meniere's disease, left ear: Secondary | ICD-10-CM | POA: Diagnosis not present

## 2024-02-20 DIAGNOSIS — H9311 Tinnitus, right ear: Secondary | ICD-10-CM

## 2024-02-20 DIAGNOSIS — H9042 Sensorineural hearing loss, unilateral, left ear, with unrestricted hearing on the contralateral side: Secondary | ICD-10-CM

## 2024-02-21 DIAGNOSIS — H9312 Tinnitus, left ear: Secondary | ICD-10-CM | POA: Insufficient documentation

## 2024-02-21 NOTE — Progress Notes (Signed)
 Follow-up: Recurrent dizziness, left ear hearing loss, left ear Mnire's disease  HPI: The patient is a 58 year old female who returns today for follow-up evaluation.  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  She was diagnosed with left ear Mnire's disease.  She was treated with a low-salt diet and Lasix  diuretic.  The patient returns today reporting no recurrent dizziness for the past 6 months.  She also denies any recent change in her hearing.  She still has occasional left ear tinnitus and left aural pressure.  Exam: General: Communicates without difficulty, well nourished, no acute distress. Head: Normocephalic, no evidence injury, no tenderness, facial buttresses intact without stepoff. Face/sinus: No tenderness to palpation and percussion. Facial movement is normal and symmetric. Eyes: PERRL, EOMI. No scleral icterus, conjunctivae clear. Neuro: CN II exam reveals vision grossly intact.  No nystagmus at any point of gaze.  Ears: Both ear canals and tympanic membranes are noted to be normal.  No middle ear effusion is noted.  Nose: External evaluation reveals normal support and skin without lesions.  Dorsum is intact.  Anterior rhinoscopy reveals pink mucosa over anterior aspect of inferior turbinates and intact septum.  No purulence noted. Oral:  Oral cavity and oropharynx are intact, symmetric, without erythema or edema.  Mucosa is moist without lesions. Neck: Full range of motion without pain.  There is no significant lymphadenopathy.  No masses palpable.  Thyroid bed within normal limits to palpation.  Parotid glands and submandibular glands equal bilaterally without mass.  Trachea is midline. Neuro:  CN 2-12 grossly intact. Gait wide-based. Vestibular: No nystagmus at any point of gaze. Dix Hallpike negative. Vestibular: There is no nystagmus with pneumatic pressure on either tympanic  membrane or Valsalva. The cerebellar examination is unremarkable.    Assessment: 1.  The patient's left ear Mnire's disease is currently under control. 2.  The patient has not experienced any significant dizziness over the past 6 months. 3.  Subjectively stable left ear tinnitus, hearing loss, and aural pressure.  Plan: 1.  The physical exam findings are reviewed with the patient. 2.  Continue with the 1500 mg low-salt diet. 3.  The patient will return for reevaluation in 6 months, sooner if needed.

## 2024-03-09 ENCOUNTER — Other Ambulatory Visit (HOSPITAL_COMMUNITY): Payer: Self-pay | Admitting: Cardiology

## 2024-03-09 MED ORDER — POTASSIUM CHLORIDE CRYS ER 20 MEQ PO TBCR: 20.0000 meq | EXTENDED_RELEASE_TABLET | Freq: Every day | ORAL | 3 refills | Status: DC

## 2024-03-09 MED ORDER — METOPROLOL SUCCINATE ER 25 MG PO TB24: 12.5000 mg | ORAL_TABLET | Freq: Every day | ORAL | 3 refills | Status: AC

## 2024-03-14 ENCOUNTER — Ambulatory Visit: Payer: Self-pay | Admitting: Internal Medicine

## 2024-03-14 NOTE — Telephone Encounter (Signed)
Please advise Dr. Chase Caller.

## 2024-03-14 NOTE — Telephone Encounter (Signed)
 FYI Only or Action Required?: Action required by provider  Patient is followed in Pulmonology for emphysema and chronic cough, last seen on 08/19/2023 by Maire Scot, MD. Called Nurse Triage reporting Cough, Leg Swelling, and Breathing Problem. Symptoms began about a month ago. Interventions attempted: Maintenance inhaler. Symptoms are: rapidly worsening.  Triage Disposition: Go to ED or PCP/Alternative with Approval  Patient/caregiver understands and will follow disposition?: No, refuses disposition - Pt stating she cannot go to ED right now, advised go to ED asap, advised seek immediate care if worsening, pt requesting call back from pulm.       Copied from CRM [id number]. Topic: Clinical - Red Word Triage >> Mar 14, 2024  1:47 PM Crist Dominion wrote: Red Word that prompted transfer to Nurse Triage: Worsening cough.  Reason for Disposition  Patient sounds very sick or weak to the triager  Answer Assessment - Initial Assessment Questions E2C2 Pulmonary Triage - Initial Assessment Questions "Chief Complaint (e.g., cough, sob, wheezing, fever, chills, sweat or additional symptoms) *Go to specific symptom protocol after initial questions. denies worse SOB than normal, but when lay down get a funny feeling like going to lose my breath, doesn't happen, don't know if I'm anxious, funny feeling concerning my breathing, not like I'm gasping, leaves but comes back, not out of breath at all, just a weird feeling I have, wondering if pulmonary hypertension trying to come back, increased cough No chest pain or dizziness Use CPAP at night Feels similar to when first diagnosed with pulmonary hypertension  "Have you used your inhalers/maintenance medication?" Yes If yes, "What medications?" Stiolto, not using rescue inhaler or nebulizer  OXYGEN: "Do you wear supplemental oxygen?" No  "Do you monitor your oxygen levels?" No If yes, "What is your reading (oxygen level) today?" Not able to read it  because I have Raynaud's  1. ONSET: "When did the cough begin?"      Cough for about a month but been definitely worse of course of month 2. SEVERITY: "How bad is the cough today?"      Same as past few days but increased from normal cough been having 3. SPUTUM: "Describe the color of your sputum" (none, dry cough; clear, white, yellow, green)     none 4. HEMOPTYSIS: "Are you coughing up any blood?" If so ask: "How much?" (flecks, streaks, tablespoons, etc.)     none 5. DIFFICULTY BREATHING: "Are you having difficulty breathing?" If Yes, ask: "How bad is it?" (e.g., mild, moderate, severe)    - MILD: No SOB at rest, mild SOB with walking, speaks normally in sentences, can lie down, no retractions, pulse < 100.    - MODERATE: SOB at rest, SOB with minimal exertion and prefers to sit, cannot lie down flat, speaks in phrases, mild retractions, audible wheezing, pulse 100-120.    - SEVERE: Very SOB at rest, speaks in single words, struggling to breathe, sitting hunched forward, retractions, pulse > 120      No wheezing or heart racing, little bit trouble laying down, think pulmonary hypertension coming back 6. FEVER: "Do you have a fever?" If Yes, ask: "What is your temperature, how was it measured, and when did it start?"     no 7. CARDIAC HISTORY: "Do you have any history of heart disease?" (e.g., heart attack, congestive heart failure)      significant 8. LUNG HISTORY: "Do you have any history of lung disease?"  (e.g., pulmonary embolus, asthma, emphysema)     significant 9. PE  RISK FACTORS: "Do you have a history of blood clots?" (or: recent major surgery, recent prolonged travel, bedridden)     no 10. OTHER SYMPTOMS: "Do you have any other symptoms?" (e.g., runny nose, wheezing, chest pain)       Leg swelling both legs even to my feet are swollen, when lay down only my feet and ankles go down but the leg part itself doesn't go down, pitting edema  Protocols used: Cough - Acute  Non-Productive-A-AH

## 2024-03-15 NOTE — Telephone Encounter (Signed)
 Katie [name]\ = tried to call the patient but went to voicemail did not leave message.  If she is having worsening symptoms but not willing to go to the ED then try to get her video visit with one of her nurse practitioners or she should try to see either video at an urgent care or onsite or an urgent care or primary care.

## 2024-03-15 NOTE — Progress Notes (Signed)
 PATIENT: Katie [name] DOB: 07/30/1966  REASON FOR VISIT: follow up HISTORY FROM: patient  Chief Complaint  Patient presents with   Follow-up    Pt in room 1. Alone. Here for cpap follow up. Pt reports she doesn't like cpap machine, does not wear nightly. 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      HISTORY OF PRESENT ILLNESS:  03/20/24 ALL:  Katie [name] returns for follow up for OSA on CPAP. She reports not using her machine as consistently over the past few months. She does not like using therapy. She uses a FFM and feels her mask fits well but she doesn't like the headgear and tubing. She fights with the tubing at night. She has a hard time getting to sleep. She is having difficulty with worsening cough, shob, weakness and swelling of her legs. She has not seen PCP recently. She was seen by pulmonology yesterday and inhalers were switched. She has an ECHO upcoming. She has an appt wth rheumatology this month. She is under more stress. She is the primary caregiver for her aunt who has dementia. She is also helping care for her Godmother. She retired from driving bus.     03/14/5408 ALL:  Katie [name] returns for follow up for OSA on CPAP. She was last seen 10/2022 and had sub optimal compliance. Since, she has worked on improving compliance. Previously 39%, now 71% compliant. 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 She does not use CPAP on nights that she works as a Engineer, structural. She may not sleep for 24 hours on nights she works as a Engineer, structural. She reports having a hard time getting to sleep at night during the week. She is on prednisone for Lupus. PCP tried her on cyclobenzaprine but it made her feel too groggy the next day. Melatonin helped her fall asleep but she had a hard time getting back to sleep if she woke up.      11/08/2022 ALL: Katie [name] returns for follow up for OSA on CPAP. She was last seen 04/2022 and struggling to  meet compliance recommendations. Since, she reports doing fairly well until mid December. She obtained a new hairstyle that does not allow correct fit of mask/headgear. She has not used CPAP since. She has not noted any particular improvement in sleep quality but does feel that morning headaches are better when using CPAP consistently. She denies concerns with machine or supplies. She does wish to continue using CPAP.     04/28/2022 ALL: Katie [name] returns for follow up for OSA on CPAP. She was last seen 10/2021 and having difficulty with compliance. She reports that she has continued to use CPAP but is not consistent. She denies any concerns with her machine or mask. She is able to tolerate CPAP when she uses it. She states that if she is in pain, she does not use her machine. Some nights she doesn't go to bed until late so she will not use CPAP. She sleeps well when she is using CPAP. 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 Dizziness has resolved. She feels medication side effects were contributing.     10/13/2021 ALL: Katie [name] is a 58 y.o. female here today for follow up for OSA on CPAP. HST 02/2021 showed severe OSA with total AHI of 86.2/hr.  She was last seen by Dr Albertina Hugger 06/02/2021. She continued to report concerns of insomnia  and fatigue. Dr Dohmeier felt that fatigue was most likely related to autoimmune disease as compliance data showed excellent management of OSA/CSA with residual AHI of 0. Headaches and sharp pains had significantly improved on CPAP. Dizziness continued with fatigue. She called 08/2021 reporting continued symptoms of dizziness, daytime sleepiness and fatigue. She reports these symptoms occur intermittently. She reports pain that prevents her from using CPAP. Tylenol  usually helps pain. She also has insomnia and wakes multiple times at night to urinate. She is on daily prednisone. She is taking melatonin that does help her get to sleep. She does endorse more restful  sleep and feels less fatigued when using CPAP. Most recently, she has not used CPAP consistently. Daily compliance at 53% and 4 hour at 30% over last 30 days. AHI was 0. She denies difficulty staying awake while driving. She drives a bus employment. She has a significant cardiac history of CHF, CAD and cardiac tamponade post MVR in 05/2010. She is followed for pulmonary HTN. No longer on O2.   HISTORY: (copied from Dr Dohmeier's previous note)  Interval History:  Katie [name] is a 58 y.o. female patient who had been seen at [facility] and is,now re- referred by Dr Schuyler Custard. Dizziness and sharp headaches. RV 06-02-2021: Katie [name] was last seen on 4 19-22 and had a procedure with a diagnosis of primary pulmonary hypertension on 02-1621.  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  She had a pericardial effusion and has a history of coronary artery disease pulmonary hypertension.   Her home sleep test was performed on 02-25-2021 and showed a sleep time of 7 hours with 60% REM sleep, her AHI was astronomically high at 86 apneas and hypopneas per hour.  The maximum oxygen saturation was 98% but the nadir was only 78% she had frequent short oxygen saturations but the total desaturation time was only 2.6 minutes.  She was prescribed a CPAP with 6 to 18 cm auto titration spectrum pressure 3 cm EPR and a mask of her choice.  The goal was to follow her today and see how she has been doing the 95th percentile pressure for this patient has been 7 cmH2O the  residual AHI is only 3.7/h which is a good resolution so she she has been 90% compliant which gives 27 out of 30 days of use was 26 of those days over 4 hours and an average use at time on days used of 5.3 hours.  The air leak that she initially presented with us  in June has much decreased to the months of July. She switched to a FFM, F 20 Resmed.    She continues to use oxygen at 2 L/min bled into her auto CPAP machine. She has not found to have any more headaches  (!)but remains as fatigued and sleepy as before. The apnea is not promoting this hypersomnia or fatigue any longer, last month download of CPAP showed a residual AHI of 0.0/h on the new FFM !  She is still having insomnia:  her son is 83 and in  7th grade.     REVIEW OF SYSTEMS: Out of a complete 14 system review of symptoms, the patient complains only of the following symptoms, chronic pain, insomnia, fatigue and all other reviewed systems are negative.  ESS: 2/24, previously 6/24  ALLERGIES: Allergies  Allergen Reactions   Cephalexin Hives   Methotrexate Nausea And Vomiting    HOME MEDICATIONS: Outpatient Medications Prior to Visit  Medication Sig Dispense Refill   acetaminophen  (TYLENOL ) 325 MG tablet Take 650 mg by mouth every 6 (six) hours as needed for moderate pain.     anifrolumab-fnia 300 mg in sodium chloride  0.9 % Inject 300 mg into the vein once.     aspirin  81 MG EC tablet Take 81 mg by mouth daily.     azaTHIOprine (IMURAN) 50 MG tablet Take 50 mg by mouth daily.      calcium carbonate (OS-CAL - DOSED IN MG OF ELEMENTAL CALCIUM) 1250 (500 Ca) MG tablet Take 2 tablets by mouth daily.     cholecalciferol (VITAMIN D3) 25 MCG (1000 UNIT) tablet Take 1,000 Units by mouth daily.     Cyanocobalamin (B-12) 1000 MCG TABS Take 1,000 mcg by mouth daily.     Fluticasone-Umeclidin-Vilant (TRELEGY ELLIPTA) 100-62.5-25 MCG/ACT AEPB Inhale 1 puff into the lungs daily. 60 each 3   Fluticasone-Umeclidin-Vilant (TRELEGY ELLIPTA)  100-62.5-25 MCG/ACT AEPB Inhale 1 puff into the lungs daily at 2 PM.     Folic Acid (FOLATE PO) Take 666 mcg by mouth daily.     furosemide  (LASIX ) 20 MG tablet Take 20 mg by mouth as needed for fluid.     Magnesium  250 MG TABS Take 250 mg by mouth daily.     metoprolol  succinate (TOPROL -XL) 25 MG 24 hr tablet Take 0.5 tablets (12.5 mg total) by mouth daily. 45 tablet 3   Multiple Vitamin (MULTIVITAMIN) tablet Take 2 tablets by mouth daily.     potassium chloride  SA (KLOR-CON  M20) 20 MEQ tablet Take 1 tablet (20 mEq total) by mouth daily. 90 tablet 3   predniSONE (DELTASONE) 5 MG tablet Take 5 mg by mouth daily.     Tiotropium Bromide-Olodaterol (STIOLTO RESPIMAT ) 2.5-2.5 MCG/ACT AERS Inhale 2 puffs into the lungs daily. 2 each 0   valACYclovir (VALTREX) 1000 MG tablet Take 1,000 mg by mouth daily as needed (fever blisters).     warfarin (COUMADIN ) 5 MG tablet Take as directed by the anticoagulation clinic. 135 tablet 3   Anifrolumab-fnia (SAPHNELO IV) Inject into the vein. (Patient not taking: Reported on 03/20/2024)     No facility-administered medications prior to visit.    PAST MEDICAL HISTORY: Past Medical History:  Diagnosis Date   Abscess of right breast    sternal osteomyeliitis, pseudomonas   C. 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    Cataract    Mixed OU   CHF (congestive heart failure) (HCC)    secondary to mmoderate mitral stenosis and moderate mitral regurgitation in the setting of rheumatic vlave disease as well as severe pulmonary HTN likely due to a combination of MV disease and pulmoanary arterial HTN in the setting of collagen vascular disease. Not valvuloplasty candidate due to MR. patient had MV replacement with mechanical MV in 8/11.   CHF (congestive heart failure) (HCC)    Echo post-op showed normally functioning  mechanical MV, EF 60-65% with septal bounce, small  mobile structure in the mid ventricle (? loose papillary muscle), RV-RA gradient 22 mmHg. Echo (10/11): EF 60-65%, normally functioning mechanical mitral vlavue.    Hypertensive retinopathy    OU   Mixed connective tissue disease (HCC)    diagnosis 1990. 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 CTA chest (7/10) with no evidence for pulmonary emboli or chronic pulmonary emboli. PA pressure 72/28 mean 50 with PVR 5.7 WU on initial RHC, PA pressure 45/21 mean 30 PVR 2.3 WU on f/u RHC on Revatio  80 mg three times a day (1/11).  After MV surgery, Revatio  stopped.   Pulmonary HTN (HCC)    increased  dyspnea. Repeat RHC with mean RA 6, PA 42/20 mean 29, PVR 4.8 WU,  2.3. Revatio  20 mg  3x a day restarted. 3/11 6 min walk 496m. 2/12 6 min walk 427 m.    Raynaud's syndrome    Restrictive lung disease    secondary to MCTD. PFTs 03/06/99 VC 47% DLCO 26%. PFTs 09/12/08 VC 43% DLCO 45%. PFTs 6/10 FVC 37%, FEV1 34%, ratio 69%, TLC 50%. using home O2 periodically   Rheumatic fever    Warfarin anticoagulation    for mechanical MV, goal INR 2.5-3.5    PAST SURGICAL HISTORY: Past Surgical History:  Procedure Laterality Date   BREAST CYST ASPIRATION  03/09/2012   CESAREAN SECTION     COLONOSCOPY     RIGHT HEART CATH N/A 08/10/2021   Procedure: RIGHT HEART CATH;  Surgeon: Darlis Eisenmenger, MD;  Location: [facility] INVASIVE CV LAB;  Service: Cardiovascular;  Laterality: N/A;   s/p mitral valvue replacement  05/13/2010   by Dr. Alva Jewels by minimally invasive window.    TUBAL LIGATION      FAMILY HISTORY: Family History  Problem Relation Age of Onset   Emphysema Mother        smoker   COPD Mother    Heart disease Maternal Grandmother        side of family not given    Diabetes Maternal Grandmother    Hypertension Father    Osteoarthritis Father    Sleep apnea Father    Breast cancer Maternal Aunt     SOCIAL HISTORY: Social History   Socioeconomic History   Marital status: Single    Spouse name: Not on file   Number of children: Not on file   Years of education: Not on file   Highest education level: Not on file  Occupational History   Not on file  Tobacco Use   Smoking status: Never    Passive exposure: Past   Smokeless tobacco: Never  Vaping Use   Vaping status: Never Used  Substance and Sexual Activity   Alcohol use: No   Drug use: No   Sexual activity: Not Currently    Birth control/protection: Other-see comments    Comment: BTL  Other Topics Concern   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    Social Drivers of Corporate investment banker Strain: Not on file  Food Insecurity: Not on file  Transportation Needs: Not on file  Physical Activity: Not on file  Stress: Not on file  Social Connections: Not on file  Intimate Partner Violence: Not on file  PHYSICAL EXAM  Vitals:   03/20/24 1012  BP: 118/68  Pulse: 89  Weight: 199 lb (90.3 kg)  Height: 5\' 5"  (1.651 m)      Body mass index is 33.12 kg/m.  Generalized: Well developed, in no acute distress  Cardiology: normal rate and rhythm, no murmur noted Respiratory: clear to auscultation bilaterally  Neurological examination  Mentation: Alert oriented to time, place, history taking. Follows all commands speech and language fluent Cranial nerve II-XII: Pupils were equal round reactive to light. Extraocular movements were full, visual field were full  Motor: The motor testing reveals 5 over 5 strength of all 4 extremities. Good symmetric motor tone is noted throughout.  Gait and station: Gait is arthritic.    DIAGNOSTIC DATA (LABS, IMAGING, TESTING) - I reviewed patient records, labs, notes, testing  and imaging myself where available.      No data to display           Lab Results  Component Value Date   WBC 3.5 (L) 11/15/2022   HGB 13.2 11/15/2022   HCT 39.5 11/15/2022   MCV 99.2 11/15/2022   PLT 195 11/15/2022      Component Value Date/Time   NA 140 11/15/2022 1118   K 3.7 11/15/2022 1118   CL 105 11/15/2022 1118   CO2 28 11/15/2022 1118   GLUCOSE 86 11/15/2022 1118   BUN 8 11/15/2022 1118   CREATININE 0.83 11/15/2022 1118   CALCIUM 8.6 (L) 11/15/2022 1118   PROT 6.5 07/01/2010 0908   ALBUMIN 3.2 (L) 07/01/2010 0908   AST 29 07/01/2010 0908   ALT 16 07/01/2010 0908   ALKPHOS 80 07/01/2010 0908   BILITOT 0.5 07/01/2010 0908   GFRNONAA >60 11/15/2022 1118   GFRAA >60 05/22/2019 1233   Lab Results  Component Value Date   CHOL 149 01/14/2016   HDL 38 (L) 01/14/2016   LDLCALC 92 01/14/2016   TRIG 93 01/14/2016   CHOLHDL 3.9 01/14/2016   Lab Results  Component Value Date   HGBA1C  05/11/2010    5.5 (NOTE)                                                                       According to the ADA Clinical Practice Recommendations for 2011, when HbA1c is used as a screening test:   >=6.5%   Diagnostic of Diabetes Mellitus           (if abnormal result  is confirmed)  5.7-6.4%   Increased risk of developing Diabetes Mellitus  References:Diagnosis and Classification of Diabetes Mellitus,Diabetes Care,2011,34(Suppl 1):S62-S69 and Standards of Medical Care in         Diabetes - 2011,Diabetes Care,2011,34  (Suppl 1):S11-S61.   No results found for: "VITAMINB12" Lab Results  Component Value Date   TSH 4.08 07/01/2010     ASSESSMENT AND PLAN 58 y.o. year old female  has a past medical history of Abscess of right breast, C. difficile diarrhea (1/12), CAD (coronary artery disease), Cardiac tamponade, Cataract, CHF (congestive heart failure) (HCC), CHF (congestive heart failure) (HCC), Hypertensive retinopathy, Mixed connective tissue disease (HCC), Nephrotic syndrome,  Pulmonary HTN (HCC), Pulmonary HTN (HCC), Raynaud's syndrome, Restrictive lung disease, Rheumatic fever, and Warfarin anticoagulation. here with  ICD-10-CM   1. Severe obstructive sleep apnea-hypopnea syndrome  G47.33 For home use only DME continuous positive airway pressure (CPAP)      Katie [name] had previously improved CPAP compliance but admits to lower compliance over the past few months. She endorses more pain and stress that contribute. Current compliance report shows sub optimal use. She is motivated to continue therapy. We discussed use of extended release melatonin to help her stay asleep. I encouraged her to start a non weight bearing exercise program. She has a stationary bike that she plans to use. She was encouraged to continue using CPAP nightly and for greater than 4 hours each night. We have discussed benefits of using CPAP and association of sleep apnea in worsening pain, insomnia, nocturia, headaches, etc. Risks of untreated sleep apnea review and education materials provided. Healthy lifestyle habits encouraged. She will follow up in 6 months, sooner if needed. She verbalizes understanding and agreement with this plan.    Orders Placed This Encounter  Procedures   For home use only DME continuous positive airway pressure (CPAP)    Heated Humidity with all supplies as needed    Length of Need:   Lifetime    Patient has OSA or probable OSA:   Yes    Is the patient currently using CPAP in the home:   Yes    Settings:   Other see comments    CPAP supplies needed:   Mask, headgear, cushions, filters, heated tubing and water chamber     No orders of the defined types were placed in this encounter.   I personally spent a total of 30 minutes in the care of the patient today including preparing to see the patient, getting/reviewing separately obtained history, performing a medically appropriate exam/evaluation, counseling and educating, placing orders, documenting clinical  information in the EHR, communicating results, and coordinating care.   Terrilyn Fick, FNP-C 03/20/2024, 10:44 AM [facility] [address] [place], [place] [address] [phone]

## 2024-03-15 NOTE — Patient Instructions (Addendum)
 Please continue using your CPAP regularly. While your insurance requires that you use CPAP at least 4 hours each night on 70% of the nights, I recommend, that you not skip any nights and use it throughout the night if you can. Getting used to CPAP and staying with the treatment long term does take time and patience and discipline. Untreated obstructive sleep apnea when it is moderate to severe can have an adverse impact on cardiovascular health and raise her risk for heart disease, arrhythmias, hypertension, congestive heart failure, stroke and diabetes. Untreated obstructive sleep apnea causes sleep disruption, nonrestorative sleep, and sleep deprivation. This can have an impact on your day to day functioning and cause daytime sleepiness and impairment of cognitive function, memory loss, mood disturbance, and problems focussing. Using CPAP regularly can improve these symptoms.  We will update supply orders, today. Please continue focusing on your compliance. Make sure you are taking care of yourself. Consider extended release melatonin. Try to get regular exercise.   Follow up in 5-6 months

## 2024-03-16 ENCOUNTER — Ambulatory Visit: Payer: Self-pay | Admitting: Registered Nurse

## 2024-03-16 NOTE — Telephone Encounter (Signed)
 Patient states she had called yesterday and was told to go to ED. Patient states at the time she could not as she was working. Patient has an appointment with Dr. Bertrum Brodie per patient on June 20th. This RN does not see appointment scheduled. Patient requests call from clinic.

## 2024-03-16 NOTE — Telephone Encounter (Signed)
 I called the pt and there was no answer- LMTCB. ?

## 2024-03-19 ENCOUNTER — Encounter: Payer: Self-pay | Admitting: Pulmonary Disease

## 2024-03-19 ENCOUNTER — Ambulatory Visit: Admitting: Pulmonary Disease

## 2024-03-19 ENCOUNTER — Telehealth: Payer: Self-pay

## 2024-03-19 VITALS — BP 116/76 | HR 89 | Temp 98.2°F | Ht 65.0 in | Wt 198.0 lb

## 2024-03-19 DIAGNOSIS — J439 Emphysema, unspecified: Secondary | ICD-10-CM

## 2024-03-19 DIAGNOSIS — R942 Abnormal results of pulmonary function studies: Secondary | ICD-10-CM | POA: Diagnosis not present

## 2024-03-19 MED ORDER — TRELEGY ELLIPTA 100-62.5-25 MCG/ACT IN AEPB: 1.0000 | INHALATION_SPRAY | Freq: Every day | RESPIRATORY_TRACT | Status: DC

## 2024-03-19 MED ORDER — TRELEGY ELLIPTA 100-62.5-25 MCG/ACT IN AEPB: 1.0000 | INHALATION_SPRAY | Freq: Every day | RESPIRATORY_TRACT | 3 refills | Status: DC

## 2024-03-19 NOTE — Progress Notes (Signed)
 Katie [name]    [id number]    04-25-1966  Primary Care Physician:Prevost, Arlice Lai, FNP  Referring Physician: Jhon Moselle, FNP [address] [place],  [place] [address]  Chief complaint:   Patient with increasing cough, shortness of breath  HPI:  Patient with COPD, increasing cough, shortness of breath I previously discussed escalating inhalers with Dr. Bertrum Brodie  Has been on Stiolto for about a year Previously had been on Spiriva  prior to that  She does not feel acutely ill No fevers, no chills, no wheezing just more coughing recently  Has a history of pulmonary hypertension, recently saw cardiology in January, follow-up appointment soon  History of systolic heart failure, past history of mitral valve repair, pulmonary hypertension. 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   Anifrolumab-fnia (SAPHNELO IV) Inject into the vein.   anifrolumab-fnia 300 mg in sodium chloride  0.9 % Inject 300 mg into the vein once.   aspirin  81 MG EC tablet Take 81 mg by mouth daily.   azaTHIOprine (IMURAN) 50 MG tablet Take 50 mg by mouth daily.    calcium carbonate (OS-CAL - DOSED IN MG OF ELEMENTAL CALCIUM) 1250 (500 Ca) MG tablet Take 2 tablets by mouth daily.   cholecalciferol (VITAMIN D3) 25 MCG (1000 UNIT) tablet Take 1,000 Units by mouth daily.   Cyanocobalamin (B-12) 1000 MCG TABS Take 1,000 mcg by mouth daily.   Fluticasone-Umeclidin-Vilant (TRELEGY ELLIPTA) 100-62.5-25 MCG/ACT AEPB Inhale 1 puff into the lungs daily.   Folic Acid (FOLATE PO) Take 666 mcg by mouth daily.   furosemide  (LASIX ) 20 MG tablet Take 20 mg by mouth as needed for fluid.   Magnesium  250 MG TABS Take 250 mg by mouth daily.   metoprolol  succinate  (TOPROL -XL) 25 MG 24 hr tablet Take 0.5 tablets (12.5 mg total) by mouth daily.   Multiple Vitamin (MULTIVITAMIN) tablet Take 2 tablets by mouth daily.   potassium chloride  SA (KLOR-CON  M20) 20 MEQ tablet Take 1 tablet (20 mEq total) by mouth daily.   predniSONE (DELTASONE) 5 MG tablet Take 5 mg by mouth daily.   valACYclovir (VALTREX) 1000 MG tablet Take 1,000 mg by mouth daily as needed (fever blisters).   warfarin (COUMADIN ) 5 MG tablet Take as directed by the anticoagulation clinic.   [DISCONTINUED] Tiotropium Bromide-Olodaterol (STIOLTO RESPIMAT ) 2.5-2.5 MCG/ACT AERS Inhale 2 puffs into the lungs daily.   Tiotropium Bromide-Olodaterol (STIOLTO RESPIMAT ) 2.5-2.5 MCG/ACT AERS Inhale 2 puffs into the lungs daily.   No facility-administered encounter medications on file as of 03/19/2024.    Allergies as of 03/19/2024 - Review Complete 03/19/2024  Allergen Reaction Noted   Cephalexin Hives 04/22/2016   Methotrexate Nausea And Vomiting     Past Medical History:  Diagnosis Date   Abscess of right breast    sternal osteomyeliitis, pseudomonas   C. 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    Cataract    Mixed OU   CHF (congestive heart failure) (HCC)    secondary to mmoderate mitral stenosis and moderate mitral regurgitation in the setting of rheumatic vlave disease as well  as severe pulmonary HTN likely due to a combination of MV disease and pulmoanary arterial HTN in the setting of collagen vascular disease. Not valvuloplasty candidate due to MR. patient had MV replacement with mechanical MV in 8/11.   CHF (congestive heart failure) (HCC)    Echo post-op showed normally functioning mechanical MV, EF 60-65% with septal bounce, small mobile structure in the mid ventricle (? loose papillary muscle), RV-RA gradient 22 mmHg. Echo (10/11): EF 60-65%,  normally functioning mechanical mitral vlavue.    Hypertensive retinopathy    OU   Mixed connective tissue disease (HCC)    diagnosis 1990. 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 CTA chest (7/10) with no evidence for pulmonary emboli or chronic pulmonary emboli. PA pressure 72/28 mean 50 with PVR 5.7 WU on initial RHC, PA pressure 45/21 mean 30 PVR 2.3 WU on f/u RHC on Revatio  80 mg three times a day (1/11).  After MV surgery, Revatio  stopped.   Pulmonary HTN (HCC)    increased  dyspnea. Repeat RHC with mean RA 6, PA 42/20 mean 29, PVR 4.8 WU,  2.3. Revatio  20 mg  3x a day restarted. 3/11 6 min walk 416m. 2/12 6 min walk 427 m.    Raynaud's syndrome    Restrictive lung disease    secondary to MCTD. PFTs 03/06/99 VC 47% DLCO 26%. PFTs 09/12/08 VC 43% DLCO 45%. PFTs 6/10 FVC 37%, FEV1 34%, ratio 69%, TLC 50%. using home O2 periodically   Rheumatic fever    Warfarin anticoagulation    for mechanical MV, goal INR 2.5-3.5    Past Surgical History:  Procedure Laterality Date   BREAST CYST ASPIRATION  03/09/2012   CESAREAN SECTION     COLONOSCOPY     RIGHT HEART CATH N/A 08/10/2021   Procedure: RIGHT HEART CATH;  Surgeon: Darlis Eisenmenger, MD;  Location: [facility] INVASIVE CV LAB;  Service: Cardiovascular;  Laterality: N/A;   s/p mitral valvue replacement  05/13/2010   by Dr. Alva Jewels by minimally invasive window.    TUBAL LIGATION      Family History  Problem Relation Age of Onset   Emphysema Mother        smoker   COPD Mother    Heart disease Maternal Grandmother        side of family not given   Diabetes Maternal Grandmother    Hypertension Father    Osteoarthritis Father    Sleep apnea Father    Breast cancer Maternal Aunt     Social History   Socioeconomic History   Marital status:  Single    Spouse name: Not on file   Number of children: Not on file   Years of education: Not on file   Highest education level: Not on file  Occupational History   Not on file  Tobacco Use   Smoking status: Never    Passive exposure: Past   Smokeless tobacco: Never  Vaping Use   Vaping status: Never Used  Substance and Sexual Activity   Alcohol use: No   Drug use: No   Sexual activity: Not Currently    Birth control/protection: Other-see comments    Comment: BTL  Other Topics Concern   Not on file  Social History Narrative   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    Social Drivers of Corporate investment banker Strain: Not on file  Food Insecurity: Not on file  Transportation Needs: Not on file  Physical Activity: Not on file  Stress: Not on file  Social Connections: Not on file  Intimate Partner Violence: Not on file    Review of Systems  Respiratory:  Positive for cough and shortness of breath.     Vitals:   03/19/24 1506  BP: 116/76  Pulse: 89  Temp: 98.2 F (36.8 C)  SpO2: 94%     Physical Exam Constitutional:      Appearance: Normal appearance.  HENT:     Head: Normocephalic.     Mouth/Throat:     Mouth: Mucous membranes are moist.  Eyes:     General: No scleral icterus. Cardiovascular:     Rate and Rhythm: Normal rate and regular rhythm.     Heart sounds: No murmur heard.    No friction rub.  Pulmonary:     Effort: No respiratory distress.     Breath sounds: No stridor. No wheezing or rhonchi.  Musculoskeletal:     Cervical back: No rigidity or tenderness.  Neurological:     Mental Status: She is alert.  Psychiatric:        Mood and Affect: Mood normal.    Data Reviewed: Most recent PFT 05/12/2023-reviewed by myself-severe restriction suggested by [facility] of 38%, moderately reduced diffusing capacity  CT scan of the chest 05/23/2023 with evidence of emphysema-centrilobular and paraseptal evidence of postinflammatory  scarring  Echocardiogram 11/15/2022-ejection fraction of 55 to 60%, right ventricular systolic pressure of 33.2  Did review last couple of office visits by Dr. Bertrum Brodie in our records  Assessment:  Chronic obstructive pulmonary disease/emphysema  Abnormal pulmonary function test with severe restriction  History of pulmonary hypertension  Worsening shortness of breath and cough  Plan/Recommendations: Prescription for Trelegy 100 sent to pharmacy, will provide her with samples in place of Stiolto  I was going to provide a prescription for prednisone 20 mg to be used for 5 to 7 days, she states she does have prednisone at home to use encouraged to use it for about 5 to 7 days  States she is not feeling acutely ill at the present time, no need for antibiotics  Continue graded activities as tolerated  Follow-up in 3 months  Encouraged to call with significant concerns   Myer Artis MD [facility] 03/19/2024, 3:22 PM  CC: Jhon Moselle, FNP

## 2024-03-19 NOTE — Patient Instructions (Signed)
 We will switch her from Stiolto to Trelegy  Use about 5 to 7 days of steroids and stop  Call us  with significant concerns  Follow-up appointment in about 3 months with Dr. Bertrum Brodie

## 2024-03-19 NOTE — Telephone Encounter (Signed)
 Copied from CRM [id number]. Topic: Clinical - Red Word Triage >> Mar 14, 2024  1:47 PM Crist Dominion wrote: Red Word that prompted transfer to Nurse Triage: Worsening cough. >> Mar 16, 2024  2:46 PM Whitney O wrote: Patient is returning call she received from nurse leslie . I believe the office is close now going to transfer patient to nurse triage   Patient has a office visit with Dr.Olalere on 03/19/2024.Nothing else further needed.

## 2024-03-19 NOTE — Telephone Encounter (Signed)
 LVM for pt to bring CPAP Machine with her to her appointment tomorrow.

## 2024-03-19 NOTE — Telephone Encounter (Signed)
 I called and spoke with the pt and scheduled her acute visit with Dr. Gaynell Keeler for today at 3 pm for eval of increased cough. Nothing further needed.

## 2024-03-20 ENCOUNTER — Encounter: Payer: Self-pay | Admitting: Family Medicine

## 2024-03-20 ENCOUNTER — Ambulatory Visit: Payer: BC Managed Care – PPO | Admitting: Family Medicine

## 2024-03-20 VITALS — BP 118/68 | HR 89 | Ht 65.0 in | Wt 199.0 lb

## 2024-03-20 DIAGNOSIS — G4733 Obstructive sleep apnea (adult) (pediatric): Secondary | ICD-10-CM | POA: Diagnosis not present

## 2024-03-20 NOTE — Addendum Note (Signed)
 Addended by: Terrilyn Fick L on: 03/20/2024 02:36 PM   Modules accepted: Level of Service

## 2024-03-20 NOTE — Progress Notes (Signed)
 Katie [name]

## 2024-03-30 ENCOUNTER — Ambulatory Visit (HOSPITAL_COMMUNITY)
Admission: RE | Admit: 2024-03-30 | Discharge: 2024-03-30 | Disposition: A | Discharge status: Home / Self Care | Source: Ambulatory Visit | Attending: Adult Health | Admitting: Adult Health

## 2024-03-30 DIAGNOSIS — G4730 Sleep apnea, unspecified: Secondary | ICD-10-CM | POA: Diagnosis not present

## 2024-03-30 DIAGNOSIS — Z952 Presence of prosthetic heart valve: Secondary | ICD-10-CM | POA: Diagnosis not present

## 2024-03-30 DIAGNOSIS — I059 Rheumatic mitral valve disease, unspecified: Secondary | ICD-10-CM | POA: Diagnosis present

## 2024-03-30 DIAGNOSIS — I509 Heart failure, unspecified: Secondary | ICD-10-CM | POA: Insufficient documentation

## 2024-03-30 DIAGNOSIS — I2510 Atherosclerotic heart disease of native coronary artery without angina pectoris: Secondary | ICD-10-CM | POA: Diagnosis not present

## 2024-03-30 DIAGNOSIS — Z48812 Encounter for surgical aftercare following surgery on the circulatory system: Secondary | ICD-10-CM | POA: Diagnosis not present

## 2024-03-30 DIAGNOSIS — I349 Nonrheumatic mitral valve disorder, unspecified: Secondary | ICD-10-CM | POA: Diagnosis not present

## 2024-03-30 LAB — ECHOCARDIOGRAM COMPLETE
AR max vel: 1.84 cm2
AV Area VTI: 1.53 cm2
AV Area mean vel: 1.71 cm2
AV Mean grad: 3 mmHg
AV Peak grad: 5.6 mmHg
Ao pk vel: 1.18 m/s
Area-P 1/2: 2.99 cm2
MV VTI: 1.38 cm2
S' Lateral: 2.3 cm

## 2024-03-30 NOTE — Progress Notes (Signed)
*  PRELIMINARY RESULTS* Echocardiogram 2D Echocardiogram has been performed.  Glenna Lango 03/30/2024, 10:03 AM

## 2024-04-02 ENCOUNTER — Ambulatory Visit (HOSPITAL_COMMUNITY): Payer: Self-pay | Admitting: Adult Health

## 2024-04-03 ENCOUNTER — Ambulatory Visit (HOSPITAL_COMMUNITY)
Admission: RE | Admit: 2024-04-03 | Discharge: 2024-04-03 | Disposition: A | Discharge status: Home / Self Care | Source: Ambulatory Visit | Attending: Physician Assistant | Admitting: Physician Assistant

## 2024-04-03 VITALS — BP 114/80 | HR 89 | Wt 196.0 lb

## 2024-04-03 DIAGNOSIS — Z79624 Long term (current) use of inhibitors of nucleotide synthesis: Secondary | ICD-10-CM | POA: Diagnosis not present

## 2024-04-03 DIAGNOSIS — Z79899 Other long term (current) drug therapy: Secondary | ICD-10-CM | POA: Insufficient documentation

## 2024-04-03 DIAGNOSIS — R059 Cough, unspecified: Secondary | ICD-10-CM | POA: Insufficient documentation

## 2024-04-03 DIAGNOSIS — Z7901 Long term (current) use of anticoagulants: Secondary | ICD-10-CM | POA: Diagnosis not present

## 2024-04-03 DIAGNOSIS — Z7982 Long term (current) use of aspirin: Secondary | ICD-10-CM | POA: Insufficient documentation

## 2024-04-03 DIAGNOSIS — G4733 Obstructive sleep apnea (adult) (pediatric): Secondary | ICD-10-CM | POA: Diagnosis not present

## 2024-04-03 DIAGNOSIS — J439 Emphysema, unspecified: Secondary | ICD-10-CM | POA: Insufficient documentation

## 2024-04-03 DIAGNOSIS — M351 Other overlap syndromes: Secondary | ICD-10-CM | POA: Diagnosis not present

## 2024-04-03 DIAGNOSIS — I272 Pulmonary hypertension, unspecified: Secondary | ICD-10-CM | POA: Diagnosis not present

## 2024-04-03 DIAGNOSIS — Z952 Presence of prosthetic heart valve: Secondary | ICD-10-CM | POA: Diagnosis not present

## 2024-04-03 DIAGNOSIS — R600 Localized edema: Secondary | ICD-10-CM | POA: Insufficient documentation

## 2024-04-03 DIAGNOSIS — Z7952 Long term (current) use of systemic steroids: Secondary | ICD-10-CM | POA: Insufficient documentation

## 2024-04-03 DIAGNOSIS — I5032 Chronic diastolic (congestive) heart failure: Secondary | ICD-10-CM

## 2024-04-03 DIAGNOSIS — R0600 Dyspnea, unspecified: Secondary | ICD-10-CM | POA: Diagnosis not present

## 2024-04-03 NOTE — Addendum Note (Signed)
 Encounter addended by: Colletta Manuelita Garre, PA-C on: 04/03/2024 1:55 PM  Actions taken: Clinical Note Signed

## 2024-04-03 NOTE — Progress Notes (Addendum)
 ADVANCED HEART FAILURE CLINIC NOTE  PCP: Dr. Clarice HF Cardiology: Dr . Rolan  Chief Complaint: Pulmonary Hypertension  58 y.o. 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    Echo in 1/19 showed EF 60-65%, mechanical mitral valve with mean gradient 5 mmHg, and PA systolic pressure 33 mmHg with normal RV.  Echo in 8/20 showed EF 50-55%, normal RV, mechanical MV with mean gradient 6 mmHg, unable to estimate PA systolic pressure, normal RV.  Echo in 11/21 showed EF 60-65%, RV probably normal, PASP 25 mmHg, mechanical MV with mean gradient 5 mmHg.   RHC was done in 10/22 after stopping sildenafil . This showed normal filling pressures and normal PA pressure.   Echo 2/24 EF 55-60%, mildly decreased RV systolic function, PASP 33 mmHg, mechanical mitral valve with no MR and stable mean gradient 5 mmHg.   Echo 06/25: EF 55-60%, RV mildly reduced, PASP 40 mmHg, mitral valve prosthesis okay  Here today for 6 month pulmonary hypertension and mitral valve disease follow-up. She noticed more shortness of breath, increased cough and lower extremity edema about a month ago. She started taking lasix  every day (previously PRN). She saw pulmonology about 2 weeks ago and was started on Trelegy. Prednisone was also increased for 5-7 days. The patient states she saw Rheumatology as well and prednisone was increased d/t leg weakness  with concern for disease flare (unable to view records). She states all of the above symptoms have improved with the exception of her leg edema. No orthopnea or PND. Weight up 5 lb from last visit in December.   Family History: Emphysema mother, smoker.  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   ROS: All systems reviewed and negative except as noted in HPI.   Current Outpatient Medications  Medication Sig Dispense Refill   acetaminophen  (TYLENOL ) 325 MG tablet Take 650 mg by mouth every 6 (six) hours as needed for moderate pain.     anifrolumab-fnia 300 mg in sodium chloride  0.9 % Inject 300 mg into the vein once.     aspirin  81 MG EC tablet Take 81 mg by mouth daily.     azaTHIOprine (IMURAN) 50 MG tablet Take 50 mg by mouth daily.      calcium carbonate (OS-CAL - DOSED IN MG OF ELEMENTAL CALCIUM) 1250 (500 Ca) MG tablet Take 2 tablets by mouth daily.     cholecalciferol (VITAMIN D3) 25 MCG (1000 UNIT) tablet Take 1,000 Units by mouth daily.     Cyanocobalamin (B-12) 1000 MCG TABS Take 1,000 mcg by mouth daily.     Fluticasone-Umeclidin-Vilant (TRELEGY ELLIPTA ) 100-62.5-25 MCG/ACT AEPB Inhale 1 puff into the lungs daily. 60 each 3   Folic Acid (FOLATE PO) Take 666 mcg by mouth daily.     furosemide  (LASIX ) 20 MG tablet Take 20 mg by mouth as needed for fluid.     Magnesium  250 MG TABS Take 250 mg by mouth daily.     metoprolol  succinate (TOPROL -XL) 25 MG 24 hr tablet Take 0.5 tablets (12.5 mg total) by mouth daily. 45 tablet 3   Multiple Vitamin (MULTIVITAMIN) tablet Take 2 tablets by mouth daily.     potassium chloride  SA (KLOR-CON  M20) 20 MEQ tablet Take 1 tablet (20 mEq total) by mouth daily. 90  tablet 3   predniSONE (DELTASONE) 5 MG tablet Take 5 mg by mouth daily.     valACYclovir (VALTREX) 1000 MG tablet Take 1,000 mg by mouth daily as needed (fever blisters).     warfarin (COUMADIN ) 5 MG tablet Take as directed by the anticoagulation clinic. 135 tablet 3   No current facility-administered medications for this encounter.    BP 114/80   Pulse 89   Wt 88.9 kg (196 lb)   SpO2 96%   BMI 32.62 kg/m  Wt Readings from Last 3 Encounters:  04/03/24 88.9 kg (196 lb)  03/20/24 90.3 kg (199 lb)  03/19/24 89.8 kg (198 lb)    General:  Well appearing. Neck: JVP to  midneck Cor: Regular rate & rhythm. Mechanical S1. No murmur. Lungs: clear Abdomen: obese, soft, nontender, nondistended.  Extremities: 1-2+ edema Neuro: alert & orientedx3. Affect pleasant   Assessment/Plan:  1. Status post mechanical MVR:  Mitral valve replacement, stable from valve standpoint. Echo 06/25 with preserved EF and normal functioning mitral valve prosthesis.  She is on coumadin  goal INR 2.5 - 3.5 and ASA 81 mg daily. INR [facility] at [facility].   No bleeding issues.  2.  Chronic diastolic CHF/Pulmonary HTN:  Pulmonary HTN is likely a mixed picture, from mitral valve disease as well as mixed connective tissue disease.  She had mild residual pulmonary HTN with moderately increased PVR even after mitral valve surgery on last RHC though this was improved compared to initial RHC.  The residual pulmonary HTN may be due to pulmonary vascular remodeling with chronic left atrial pressure elevation in the setting of mitral valve disease. Revatio  was restarted at 20 mg tid.  Echo in 8/20 showed RV normal but unable to estimate PA systolic pressure.  Echo in 11/21 showed normal RV with PASP 25 mm Hg.   She is now off sildenafil .  RHC in 10/22 showed normal filling pressures and normal PA pressure (off sildenafil ). PASP estimated at 40 mmHg and RV mildly reduced on most recent echo in 06/25.  - NYHA III. - Recently notes more dyspnea, cough and lower extremity edema. ReDS only 27% but appears volume up on exam, looks more right sided. All but her edema has improved with increasing diuretic, prednisone and new inhaler. Will increase lasix  to 40 mg daily X 1 week then reduce back to 40 mg daily. CMET at [facility] on 06/19 with stable Scr at 0.7, K 4.9 and CO2 24. LFTs WNL. Check BMET/BNP in 1 week. - Depending on her progress may need to consider repeat RHC. - Now off sildenafil .   - Continue CPAP for OSA.  3. Mixed connective tissue disorder: Patient has history of pulmonary fibrosis, but most  recent CT in 2/22 showed emphysema and no significant fibrosis. Followed by rheumatology at [facility]. Unable to come off prednisone. Will request most recent note for review. - On prednisone and azathioprine. Now also on Saphnelo once monthly infusion. 4. COPD: Emphysema on 2/22 CT chest.  Thought to be due to passive smoking.  - Per pulmonary - Recently started Trelegy 5. OSA: Continue CPAP - Follows with [facility] - Only about 53% compliant recently  Follow up 4-6 weeks with Dr. Rolan  [facility], [facility] PA-C 10:16 AM

## 2024-04-03 NOTE — Patient Instructions (Signed)
 INCREASE lasix  to 40 mg daily for 1 week, then go back to 20 mg daily.  Blood work in 1 week.  Your physician recommends that you schedule a follow-up appointment as scheduled.  If you have any questions or concerns before your next appointment please send us  a message through [place] or call our office at [phone].    TO LEAVE A MESSAGE FOR THE NURSE SELECT OPTION 2, PLEASE LEAVE A MESSAGE INCLUDING: YOUR NAME DATE OF BIRTH CALL BACK NUMBER REASON FOR CALL**this is important as we prioritize the call backs  YOU WILL RECEIVE A CALL BACK THE SAME DAY AS LONG AS YOU CALL BEFORE 4:00 PM  At the [facility], you and your health needs are our priority. As part of our continuing mission to provide you with exceptional heart care, we have created designated Provider Care Teams. These Care Teams include your primary Cardiologist (physician) and Advanced Practice Providers (APPs- Physician Assistants and Nurse Practitioners) who all work together to provide you with the care you need, when you need it.   You may see any of the following providers on your designated Care Team at your next follow up: Dr Toribio Fuel Dr Ezra Shuck Dr. Ria Commander Dr. Morene Brownie Amy Lenetta, NP Caffie Shed, [place] [facility] [place], [place] Beckey Coe, NP [place] Lee, NP Ellouise Class, NP Tinnie Redman, PharmD Jaun Bash, PharmD   Please be sure to bring in all your medications bottles to every appointment.    Thank you for choosing [facility]

## 2024-04-03 NOTE — Progress Notes (Signed)
 ReDS Vest / Clip - 04/03/24 1000       ReDS Vest / Clip   Station Marker B    Ruler Value 31    ReDS Value Range Low volume    ReDS Actual Value 27

## 2024-04-09 ENCOUNTER — Ambulatory Visit: Payer: Self-pay | Admitting: Physician Assistant

## 2024-04-09 ENCOUNTER — Ambulatory Visit (HOSPITAL_COMMUNITY)
Admission: RE | Admit: 2024-04-09 | Discharge: 2024-04-09 | Disposition: A | Discharge status: Home / Self Care | Source: Ambulatory Visit | Attending: Cardiology | Admitting: Cardiology

## 2024-04-09 DIAGNOSIS — I5032 Chronic diastolic (congestive) heart failure: Secondary | ICD-10-CM | POA: Diagnosis present

## 2024-04-09 LAB — BASIC METABOLIC PANEL WITH GFR
Anion gap: 8 (ref 5–15)
BUN: 15 mg/dL (ref 6–20)
CO2: 28 mmol/L (ref 22–32)
Calcium: 8.9 mg/dL (ref 8.9–10.3)
Chloride: 102 mmol/L (ref 98–111)
Creatinine, Ser: 0.8 mg/dL (ref 0.44–1.00)
GFR, Estimated: >60 mL/min (ref >60)
Glucose, Bld: 92 mg/dL (ref 70–99)
Potassium: 4.4 mmol/L (ref 3.5–5.1)
Sodium: 138 mmol/L (ref 135–145)

## 2024-04-09 LAB — BRAIN NATRIURETIC PEPTIDE: B Natriuretic Peptide: 77.6 pg/mL (ref 0.0–100.0)

## 2024-04-11 ENCOUNTER — Ambulatory Visit: Payer: Self-pay | Admitting: Registered Nurse

## 2024-04-11 NOTE — Telephone Encounter (Signed)
 FYI Dr Marchelle Gearing

## 2024-04-11 NOTE — Telephone Encounter (Signed)
 FYI Only or Action Required?: FYI only for provider.  Patient is followed in Pulmonology for COPD, last seen on 03/19/2024 by Neda Jennet LABOR, MD. Called Nurse Triage reporting Appointment. Triage Disposition: Information or Advice Only Call  Patient/caregiver understands and will follow disposition?: Yes           Copied from CRM [id number]. Topic: Clinical - Red Word Triage >> Apr 11, 2024  4:38 PM Katie [name] wrote: Red Word that prompted transfer to Nurse Triage: Pt has been having SOB and constant cough for about 2 or 3 weeks. Pt stated it has subsided, but to be on the safe side I'm sending to NT. Pt sees Katie [name] in [place]. Pt is requesting a follow up appt (67mo).  Pt saw rheumatologist, cardiologist, and was told to see her pulm provider. Reason for Disposition  Health Information question, no triage required and triager able to answer question  Answer Assessment - Initial Assessment Questions 1. REASON FOR CALL or QUESTION: What is your reason for calling today? or How can I best help you? or What question do you have that I can help answer?     Patient states she is having no symptoms. Pt denies SOB. Pt states she just wants to schedule a follow up visit with Katie [name]. This RN scheduled pt for first available appt on 9/2.  Protocols used: Information Only Call - No Triage-A-AH

## 2024-04-12 NOTE — Telephone Encounter (Signed)
 Ok thanks.CLosing notes

## 2024-05-01 ENCOUNTER — Other Ambulatory Visit (HOSPITAL_COMMUNITY): Payer: Self-pay

## 2024-05-01 ENCOUNTER — Inpatient Hospital Stay (HOSPITAL_COMMUNITY)
Admission: RE | Admit: 2024-05-01 | Discharge: 2024-05-01 | Discharge status: Home / Self Care | Source: Ambulatory Visit | Attending: Cardiology | Admitting: Cardiology

## 2024-05-01 ENCOUNTER — Ambulatory Visit (HOSPITAL_COMMUNITY): Payer: Self-pay | Admitting: Cardiology

## 2024-05-01 VITALS — BP 112/76 | HR 92 | Ht 65.0 in | Wt 197.4 lb

## 2024-05-01 DIAGNOSIS — I272 Pulmonary hypertension, unspecified: Secondary | ICD-10-CM

## 2024-05-01 DIAGNOSIS — Z952 Presence of prosthetic heart valve: Secondary | ICD-10-CM | POA: Insufficient documentation

## 2024-05-01 DIAGNOSIS — E785 Hyperlipidemia, unspecified: Secondary | ICD-10-CM

## 2024-05-01 DIAGNOSIS — Z7901 Long term (current) use of anticoagulants: Secondary | ICD-10-CM | POA: Insufficient documentation

## 2024-05-01 DIAGNOSIS — I2510 Atherosclerotic heart disease of native coronary artery without angina pectoris: Secondary | ICD-10-CM | POA: Insufficient documentation

## 2024-05-01 DIAGNOSIS — Z7952 Long term (current) use of systemic steroids: Secondary | ICD-10-CM | POA: Insufficient documentation

## 2024-05-01 DIAGNOSIS — Z79624 Long term (current) use of inhibitors of nucleotide synthesis: Secondary | ICD-10-CM | POA: Diagnosis not present

## 2024-05-01 DIAGNOSIS — Z79899 Other long term (current) drug therapy: Secondary | ICD-10-CM | POA: Insufficient documentation

## 2024-05-01 DIAGNOSIS — I5032 Chronic diastolic (congestive) heart failure: Secondary | ICD-10-CM | POA: Insufficient documentation

## 2024-05-01 DIAGNOSIS — Z7982 Long term (current) use of aspirin: Secondary | ICD-10-CM | POA: Insufficient documentation

## 2024-05-01 DIAGNOSIS — J439 Emphysema, unspecified: Secondary | ICD-10-CM | POA: Diagnosis not present

## 2024-05-01 DIAGNOSIS — G4733 Obstructive sleep apnea (adult) (pediatric): Secondary | ICD-10-CM | POA: Insufficient documentation

## 2024-05-01 DIAGNOSIS — I5022 Chronic systolic (congestive) heart failure: Secondary | ICD-10-CM

## 2024-05-01 LAB — CBC
HCT: 43.2 % (ref 36.0–46.0)
Hemoglobin: 14 g/dL (ref 12.0–15.0)
MCH: 31.1 pg (ref 26.0–34.0)
MCHC: 32.4 g/dL (ref 30.0–36.0)
MCV: 96 fL (ref 80.0–100.0)
Platelets: 209 K/uL (ref 150–400)
RBC: 4.5 MIL/uL (ref 3.87–5.11)
RDW: 14.6 % (ref 11.5–15.5)
WBC: 6.3 K/uL (ref 4.0–10.5)
nRBC: 0 % (ref 0.0–0.2)

## 2024-05-01 LAB — BASIC METABOLIC PANEL WITH GFR
Anion gap: 7 (ref 5–15)
BUN: 10 mg/dL (ref 6–20)
CO2: 28 mmol/L (ref 22–32)
Calcium: 9 mg/dL (ref 8.9–10.3)
Chloride: 104 mmol/L (ref 98–111)
Creatinine, Ser: 0.71 mg/dL (ref 0.44–1.00)
GFR, Estimated: >60 mL/min (ref >60)
Glucose, Bld: 94 mg/dL (ref 70–99)
Potassium: 4.5 mmol/L (ref 3.5–5.1)
Sodium: 139 mmol/L (ref 135–145)

## 2024-05-01 LAB — LIPID PANEL
Cholesterol: 155 mg/dL (ref 0–200)
HDL: 45 mg/dL (ref >40)
LDL Cholesterol: 98 mg/dL (ref 0–99)
Total CHOL/HDL Ratio: 3.4 ratio
Triglycerides: 60 mg/dL (ref <150)
VLDL: 12 mg/dL (ref 0–40)

## 2024-05-01 LAB — BRAIN NATRIURETIC PEPTIDE: B Natriuretic Peptide: 167 pg/mL — ABNORMAL HIGH (ref 0.0–100.0)

## 2024-05-01 MED ORDER — POTASSIUM CHLORIDE CRYS ER 20 MEQ PO TBCR: 40.0000 meq | EXTENDED_RELEASE_TABLET | Freq: Every day | ORAL | 3 refills | Status: AC

## 2024-05-01 MED ORDER — FUROSEMIDE 20 MG PO TABS: 40.0000 mg | ORAL_TABLET | Freq: Every day | ORAL | 3 refills | Status: AC

## 2024-05-01 NOTE — Patient Instructions (Signed)
 CHANGE Lasix  to 40 mg daily.  CHANGE Potassium to 40 mEq ( 2 Tab) daily.  Labs done today, your results will be available in MyChart, we will contact you for abnormal readings.   You are scheduled for a Cardiac Catheterization on Thursday, August 7 with Dr. Ezra Shuck.  1. Please arrive at the [facility] (Main Entrance A) at [facility]: [address] [place], [place] [address] at 7:00 AM (This time is 2 hour(s) before your procedure to ensure your preparation).   Free valet parking service is available. You will check in at ADMITTING. The support person will be asked to wait in the waiting room.  It is OK to have someone drop you off and come back when you are ready to be discharged.    Special note: Every effort is made to have your procedure done on time. Please understand that emergencies sometimes delay scheduled procedures.  2. Diet: Do not eat solid foods after midnight.  The patient may have clear liquids until 5am upon the day of the procedure.  3. Medication instructions in preparation for your procedure:   Contrast Allergy: No  HOLD YOUR WARFARIN THE EVENING BEFORE YOUR PROCEDURE  HOLD YOUR LASIX  THE MORNING OF THE PROCEDURE.  On the morning of your procedure, take any morning medicines NOT listed above.  You may use sips of water.  5. Plan to go home the same day, you will only stay overnight if medically necessary. 6. Bring a current list of your medications and current insurance cards. 7. You MUST have a responsible person to drive you home. 8. Someone MUST be with you the first 24 hours after you arrive home or your discharge will be delayed. 9. Please wear clothes that are easy to get on and off and wear slip-on shoes.  Your physician recommends that you schedule a follow-up appointment in: 6 Weeks.  If you have any questions or concerns before your next appointment please send us  a message through [place] or call our office at [phone].    TO  LEAVE A MESSAGE FOR THE NURSE SELECT OPTION 2, PLEASE LEAVE A MESSAGE INCLUDING: YOUR NAME DATE OF BIRTH CALL BACK NUMBER REASON FOR CALL**this is important as we prioritize the call backs  YOU WILL RECEIVE A CALL BACK THE SAME DAY AS LONG AS YOU CALL BEFORE 4:00 PM  At the [facility], you and your health needs are our priority. As part of our continuing mission to provide you with exceptional heart care, we have created designated Provider Care Teams. These Care Teams include your primary Cardiologist (physician) and Advanced Practice Providers (APPs- Physician Assistants and Nurse Practitioners) who all work together to provide you with the care you need, when you need it.   You may see any of the following providers on your designated Care Team at your next follow up: Dr Toribio Fuel Dr Ezra Shuck Dr. Ria Commander Dr. Morene Brownie Amy Lenetta, NP Caffie Shed, [place] [facility] [place], [place] Beckey Coe, NP [place] Lee, NP Ellouise Class, NP Tinnie Redman, PharmD Jaun Bash, PharmD   Please be sure to bring in all your medications bottles to every appointment.    Thank you for choosing [facility]

## 2024-05-01 NOTE — Progress Notes (Signed)
 ADVANCED HEART FAILURE CLINIC NOTE  PCP: Dr. Clarice HF Cardiology: Dr. Rolan  Chief Complaint: Pulmonary Hypertension  58 y.o. 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    Echo in 1/19 showed EF 60-65%, mechanical mitral valve with mean gradient 5 mmHg, and PA systolic pressure 33 mmHg with normal RV.  Echo in 8/20 showed EF 50-55%, normal RV, mechanical MV with mean gradient 6 mmHg, unable to estimate PA systolic pressure, normal RV.  Echo in 11/21 showed EF 60-65%, RV probably normal, PASP 25 mmHg, mechanical MV with mean gradient 5 mmHg.   RHC was done in 10/22 after stopping sildenafil . This showed normal filling pressures and normal PA pressure.   Echo 2/24 EF 55-60%, mildly decreased RV systolic function, PASP 33 mmHg, mechanical mitral valve with no MR and stable mean gradient 5 mmHg.   Echo 6/25 showed EF 55-60%, RV mildly reduced systolic function, PASP 40 mmHg, mechanical mitral valve with mean gradient 2 mmHg and normal IVC.   At last appointment, patient was thought to be volume overloaded.  She was started on Lasix  20 mg daily.  Breathing is now better.  She is not short of breath walking on flat ground or up 1 flight of stairs.  She gets short of breath if she rushes.  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  Weight is up 1 lb.  No chest pain.  No orthopnea/PND.   ECG (personally reviewed): NSR, blocked PAC, RBBB  Labs (6/25): K 4.4, creatinine 0.8, BNP 78  Past Medical History: 1. Mixed connective tissue disease: diagnosis 1990. 8/10 ANA positive, anti-dsDNA positive, anti-SCL70 negative 2. 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     -  PFT's 03/06/99  VC 47%  DLC0 26%    - PFT's 09/12/08  VC 43%  DLC0 45%    - PFT's 6/10 FVC 37%, FEV1 34%, ratio 69%, TLC 50%    - PFTs 11/15 FVC 39%, FEV1 35%, ratio 89%, TLC 63%, DLCO 48%    - CT chest in 2/22 showed emphysema but no ILD.  4. Nephrotic syndrome: secondary to MCTD 5. CHF: Secondary to moderate mitral stenosis and moderate mitral regurgitation in the setting of rheumatic mitral valve disease as well as severe pulmonary HTN likely due to a combination of MV disease and pulmonary arterial HTN in the setting of collagen vascular disease.   Not valvuloplasty candidate due to MR.  Patient had MV replacement with mechanical MV in 8/11.  Echo (8/11) post-op showed normally functioning mechanical MV, EF 60-65% with septal bounce, small mobile structure in the mid ventricle (? loose papillary muscle), RV-RA gradient 22 mmHg.  Echo (10/11): EF 60-65%, normally functioning mechanical mitral valve.  Echo (7/13): EF 55-60%, mechanical mitral valve with mean gradient 4 mmHg, PA systolic pressure 29 mmHg. Echo (5/15) with EF 55-60%, mechanical mitral valve functioning normally, normal RV size and systolic function, PA systolic pressure 32 mmHg.  - Echo in 5/17 showed EF 60-65%, normal mechanical mitral valve, and PA systolic pressure 33 mmHg with normal RV.  - Echo (1/19): EF 60-65%, mild LVH, normal RV size and systolic function, mechanical mitral valve with mean gradient 6 mmHg, PASP 33 mmHg.  - Echo (8/20): EF 50-55%, normal  RV, mechanical MV with mean gradient 6 mmHg, unable to estimate PA systolic pressure, normal RV.  No change from prior.  - Echo (11/21): EF 60-65%, RV probably normal, PASP 25 mmHg, mechanical MV with mean gradient 5 mmHg. - Echo (2/24): EF 55-60%, mildly decreased RV systolic function, PASP 33 mmHg, mechanical mitral valve with no MR and stable mean gradient 5 mmHg.  - Echo (6/25): EF 55-60%, RV mildly reduced systolic function, PASP 40 mmHg, mechanical mitral valve with mean  gradient 2 mmHg and normal IVC.  6.  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  CTA chest (7/10) with no evidence for pulmonary emboli or chronic pulmonary emboli.  PA pressure 72/28 mean 50 with PVR 5.7 WU on initial RHC, PA pressure 45/21 mean 30 PVR 2.3 WU on f/u RHC on Revatio  80 mg three times a day (1/11).  After MV surgery, Revatio  stopped.  Increased dyspnea.  Repeat RHC with mean RA 6, PA 42/20 (mean 29), PVR 4.8 WU, CI 2.3.  Revatio  20 mg three times a day restarted.  Echo (7/13) with PA systolic pressure 29 mmHg. Echo (5/15) with PA systolic pressure 32 mmHg.  - RHC (10/22): mean RA 1, PA 28/8 mean 18, mean PCWP 7, CI 2.83, PVR 2 WU.  7.  Rheumatic fever 8.  LHC (9/10): no angiographic CAD.  9.  Warfarin anticoagulation for mechanical MV, goal INR 2.5-3.5 10. 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  11. 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 C difficile diarrhea 1/12 13. GERD 14. Holter (1/19): No significant arrhythmias.  15. COPD: CT chest in 2/22 showed emphysema but no ILD.  Suspect due to passive smoking.  - High resolution CT chest (8/24): Tracheobronchomalacia, moderate to severe emphysema, no ILD, +coronary calcification.   Family History: Emphysema mother, smoker.  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   ROS: All systems reviewed and negative except as noted in HPI.   Current Outpatient Medications  Medication Sig Dispense Refill   acetaminophen  (TYLENOL ) 325 MG tablet Take 650 mg by mouth every 6 (six) hours as needed for moderate pain. (Patient taking differently: Take 650 mg by mouth as needed for moderate pain (pain score 4-6).)     anifrolumab-fnia 300 mg in sodium chloride  0.9 % Inject 300 mg into the vein once.     aspirin  81  MG EC tablet Take 81 mg by mouth daily.     azaTHIOprine (IMURAN) 50 MG tablet Take 50 mg by mouth daily.      Calcium  Carb-Cholecalciferol (CALCIUM  600+D3 PO) Take 1 tablet by mouth every morning.     cholecalciferol (VITAMIN D3) 25 MCG (1000 UNIT) tablet Take 1,000 Units by mouth daily.     Cyanocobalamin (B-12) 1000 MCG TABS Take 1,000 mcg by mouth daily.     Fluticasone-Umeclidin-Vilant (TRELEGY ELLIPTA ) 100-62.5-25 MCG/ACT AEPB Inhale 1 puff into the lungs daily. 60 each 3   Folic Acid (FOLATE PO) Take 666 mcg by mouth daily.     Magnesium  250 MG TABS Take 250 mg by mouth daily.     metoprolol  succinate (TOPROL -XL) 25 MG 24 hr tablet Take 0.5 tablets (12.5 mg total) by mouth daily. 45 tablet 3   Multiple Vitamin (MULTIVITAMIN) tablet Take 2 tablets by mouth daily.     predniSONE (DELTASONE) 5 MG tablet Take 5 mg by mouth  daily.     valACYclovir (VALTREX) 1000 MG tablet Take 1,000 mg by mouth daily as needed (fever blisters).     warfarin (COUMADIN ) 5 MG tablet Take as directed by the anticoagulation clinic. 135 tablet 3   furosemide  (LASIX ) 20 MG tablet Take 2 tablets (40 mg total) by mouth daily. 180 tablet 3   potassium chloride  SA (KLOR-CON  M20) 20 MEQ tablet Take 2 tablets (40 mEq total) by mouth daily. 180 tablet 3   No current facility-administered medications for this encounter.    BP 112/76   Pulse 92   Ht 5' 5 (1.651 m)   Wt 89.5 kg (197 lb 6.4 oz)   SpO2 97%   BMI 32.85 kg/m  Wt Readings from Last 3 Encounters:  05/01/24 89.5 kg (197 lb 6.4 oz)  04/03/24 88.9 kg (196 lb)  03/20/24 90.3 kg (199 lb)  General: NAD Neck: JVP 8-9 cm, no thyromegaly or thyroid nodule.  Lungs: Clear to auscultation bilaterally with normal respiratory effort. CV: Nondisplaced PMI.  Heart regular S1/S2 with mechanical S1, no S3/S4, no murmur.  1+ ankle edema.  No carotid bruit.  Normal pedal pulses.  Abdomen: Soft, nontender, no hepatosplenomegaly, no distention.  Skin: Intact without  lesions or rashes.  Neurologic: Alert and oriented x 3.  Psych: Normal affect. Extremities: No clubbing or cyanosis.  HEENT: Normal.   Assessment/Plan:  1. Status post mechanical MVR:  Mitral valve replacement, stable from valve standpoint. Echo 06/25 with preserved EF and normal functioning mitral valve prosthesis.  She is on coumadin  goal INR 2.5 - 3.5 and ASA 81 mg daily. INR followed at [facility].   No bleeding issues.  2.  Chronic diastolic CHF/Pulmonary HTN:  Pulmonary HTN was likely a mixed picture, from mitral valve disease as well as mixed connective tissue disease and possibly WHO group 3 from COPD.  She had mild residual pulmonary HTN with moderately increased PVR even after mitral valve surgery.  The residual pulmonary HTN may be due to pulmonary vascular remodeling with chronic left atrial pressure elevation in the setting of mitral valve disease. Revatio  was restarted at 20 mg tid.  Echo in 8/20 showed RV normal but unable to estimate PA systolic pressure.  Echo in 11/21 showed normal RV with PASP 25 mm Hg.   She is now off sildenafil .  RHC in 10/22 showed normal filling pressures and normal PA pressure (off sildenafil ). However, PASP estimated at 40 mmHg and RV mildly reduced on most recent echo in 6/25. NYHA class II now, doing better on Lasix  20 mg daily but still looks at least mildly volume overloaded.  - Increase Lasix  to 40 mg daily and KCl to 40 mEq daily. BMET/BNP today, BMET in 10 days.  - With elevated PA pressure by echo and volume overload, I am going to arrange for RHC to assess filling pressures and PA pressure.  We discussed risks/benefits and she agrees to procedure.  - Continue CPAP for OSA.  3. Mixed connective tissue disorder: Patient has history of pulmonary fibrosis, but most recent CT in 2/22 showed emphysema and no significant fibrosis. Followed by rheumatology at [facility]. Unable to come off prednisone. Will request most recent note for  review. - On prednisone and azathioprine. Now also on Saphnelo once monthly infusion. 4. COPD: Emphysema on 2/22 CT chest.  Thought to be due to passive smoking.  HRCT in 8/24 with no definite ILD but tracheobronchomalacia and moderate-severe emphysema noted.  -  Trelegy has helped  5. OSA: Continue CPAP 6. Hyperlipidemia: With coronary calcification on CT, would like to see LDL < 55.  - Check lipids, will start statin depending on result.   Follow up in 6 weeks with APP.   I spent 41 minutes reviewing records, interviewing/examining patient, and managing orders.   Ezra Shuck  05/01/2024

## 2024-05-01 NOTE — H&P (View-Only) (Signed)
 ADVANCED HEART FAILURE CLINIC NOTE  PCP: Dr. Clarice HF Cardiology: Dr. Rolan  Chief Complaint: Pulmonary Hypertension  58 y.o. 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    Echo in 1/19 showed EF 60-65%, mechanical mitral valve with mean gradient 5 mmHg, and PA systolic pressure 33 mmHg with normal RV.  Echo in 8/20 showed EF 50-55%, normal RV, mechanical MV with mean gradient 6 mmHg, unable to estimate PA systolic pressure, normal RV.  Echo in 11/21 showed EF 60-65%, RV probably normal, PASP 25 mmHg, mechanical MV with mean gradient 5 mmHg.   RHC was done in 10/22 after stopping sildenafil . This showed normal filling pressures and normal PA pressure.   Echo 2/24 EF 55-60%, mildly decreased RV systolic function, PASP 33 mmHg, mechanical mitral valve with no MR and stable mean gradient 5 mmHg.   Echo 6/25 showed EF 55-60%, RV mildly reduced systolic function, PASP 40 mmHg, mechanical mitral valve with mean gradient 2 mmHg and normal IVC.   At last appointment, patient was thought to be volume overloaded.  She was started on Lasix  20 mg daily.  Breathing is now better.  She is not short of breath walking on flat ground or up 1 flight of stairs.  She gets short of breath if she rushes.  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  Weight is up 1 lb.  No chest pain.  No orthopnea/PND.   ECG (personally reviewed): NSR, blocked PAC, RBBB  Labs (6/25): K 4.4, creatinine 0.8, BNP 78  Past Medical History: 1. Mixed connective tissue disease: diagnosis 1990. 8/10 ANA positive, anti-dsDNA positive, anti-SCL70 negative 2. 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     -  PFT's 03/06/99  VC 47%  DLC0 26%    - PFT's 09/12/08  VC 43%  DLC0 45%    - PFT's 6/10 FVC 37%, FEV1 34%, ratio 69%, TLC 50%    - PFTs 11/15 FVC 39%, FEV1 35%, ratio 89%, TLC 63%, DLCO 48%    - CT chest in 2/22 showed emphysema but no ILD.  4. Nephrotic syndrome: secondary to MCTD 5. CHF: Secondary to moderate mitral stenosis and moderate mitral regurgitation in the setting of rheumatic mitral valve disease as well as severe pulmonary HTN likely due to a combination of MV disease and pulmonary arterial HTN in the setting of collagen vascular disease.   Not valvuloplasty candidate due to MR.  Patient had MV replacement with mechanical MV in 8/11.  Echo (8/11) post-op showed normally functioning mechanical MV, EF 60-65% with septal bounce, small mobile structure in the mid ventricle (? loose papillary muscle), RV-RA gradient 22 mmHg.  Echo (10/11): EF 60-65%, normally functioning mechanical mitral valve.  Echo (7/13): EF 55-60%, mechanical mitral valve with mean gradient 4 mmHg, PA systolic pressure 29 mmHg. Echo (5/15) with EF 55-60%, mechanical mitral valve functioning normally, normal RV size and systolic function, PA systolic pressure 32 mmHg.  - Echo in 5/17 showed EF 60-65%, normal mechanical mitral valve, and PA systolic pressure 33 mmHg with normal RV.  - Echo (1/19): EF 60-65%, mild LVH, normal RV size and systolic function, mechanical mitral valve with mean gradient 6 mmHg, PASP 33 mmHg.  - Echo (8/20): EF 50-55%, normal  RV, mechanical MV with mean gradient 6 mmHg, unable to estimate PA systolic pressure, normal RV.  No change from prior.  - Echo (11/21): EF 60-65%, RV probably normal, PASP 25 mmHg, mechanical MV with mean gradient 5 mmHg. - Echo (2/24): EF 55-60%, mildly decreased RV systolic function, PASP 33 mmHg, mechanical mitral valve with no MR and stable mean gradient 5 mmHg.  - Echo (6/25): EF 55-60%, RV mildly reduced systolic function, PASP 40 mmHg, mechanical mitral valve with mean  gradient 2 mmHg and normal IVC.  6.  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  CTA chest (7/10) with no evidence for pulmonary emboli or chronic pulmonary emboli.  PA pressure 72/28 mean 50 with PVR 5.7 WU on initial RHC, PA pressure 45/21 mean 30 PVR 2.3 WU on f/u RHC on Revatio  80 mg three times a day (1/11).  After MV surgery, Revatio  stopped.  Increased dyspnea.  Repeat RHC with mean RA 6, PA 42/20 (mean 29), PVR 4.8 WU, CI 2.3.  Revatio  20 mg three times a day restarted.  Echo (7/13) with PA systolic pressure 29 mmHg. Echo (5/15) with PA systolic pressure 32 mmHg.  - RHC (10/22): mean RA 1, PA 28/8 mean 18, mean PCWP 7, CI 2.83, PVR 2 WU.  7.  Rheumatic fever 8.  LHC (9/10): no angiographic CAD.  9.  Warfarin anticoagulation for mechanical MV, goal INR 2.5-3.5 10. 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  11. 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 C difficile diarrhea 1/12 13. GERD 14. Holter (1/19): No significant arrhythmias.  15. COPD: CT chest in 2/22 showed emphysema but no ILD.  Suspect due to passive smoking.  - High resolution CT chest (8/24): Tracheobronchomalacia, moderate to severe emphysema, no ILD, +coronary calcification.   Family History: Emphysema mother, smoker.  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   ROS: All systems reviewed and negative except as noted in HPI.   Current Outpatient Medications  Medication Sig Dispense Refill   acetaminophen  (TYLENOL ) 325 MG tablet Take 650 mg by mouth every 6 (six) hours as needed for moderate pain. (Patient taking differently: Take 650 mg by mouth as needed for moderate pain (pain score 4-6).)     anifrolumab-fnia 300 mg in sodium chloride  0.9 % Inject 300 mg into the vein once.     aspirin  81  MG EC tablet Take 81 mg by mouth daily.     azaTHIOprine (IMURAN) 50 MG tablet Take 50 mg by mouth daily.      Calcium  Carb-Cholecalciferol (CALCIUM  600+D3 PO) Take 1 tablet by mouth every morning.     cholecalciferol (VITAMIN D3) 25 MCG (1000 UNIT) tablet Take 1,000 Units by mouth daily.     Cyanocobalamin (B-12) 1000 MCG TABS Take 1,000 mcg by mouth daily.     Fluticasone-Umeclidin-Vilant (TRELEGY ELLIPTA ) 100-62.5-25 MCG/ACT AEPB Inhale 1 puff into the lungs daily. 60 each 3   Folic Acid (FOLATE PO) Take 666 mcg by mouth daily.     Magnesium  250 MG TABS Take 250 mg by mouth daily.     metoprolol  succinate (TOPROL -XL) 25 MG 24 hr tablet Take 0.5 tablets (12.5 mg total) by mouth daily. 45 tablet 3   Multiple Vitamin (MULTIVITAMIN) tablet Take 2 tablets by mouth daily.     predniSONE (DELTASONE) 5 MG tablet Take 5 mg by mouth  daily.     valACYclovir (VALTREX) 1000 MG tablet Take 1,000 mg by mouth daily as needed (fever blisters).     warfarin (COUMADIN ) 5 MG tablet Take as directed by the anticoagulation clinic. 135 tablet 3   furosemide  (LASIX ) 20 MG tablet Take 2 tablets (40 mg total) by mouth daily. 180 tablet 3   potassium chloride  SA (KLOR-CON  M20) 20 MEQ tablet Take 2 tablets (40 mEq total) by mouth daily. 180 tablet 3   No current facility-administered medications for this encounter.    BP 112/76   Pulse 92   Ht 5' 5 (1.651 m)   Wt 89.5 kg (197 lb 6.4 oz)   SpO2 97%   BMI 32.85 kg/m  Wt Readings from Last 3 Encounters:  05/01/24 89.5 kg (197 lb 6.4 oz)  04/03/24 88.9 kg (196 lb)  03/20/24 90.3 kg (199 lb)  General: NAD Neck: JVP 8-9 cm, no thyromegaly or thyroid nodule.  Lungs: Clear to auscultation bilaterally with normal respiratory effort. CV: Nondisplaced PMI.  Heart regular S1/S2 with mechanical S1, no S3/S4, no murmur.  1+ ankle edema.  No carotid bruit.  Normal pedal pulses.  Abdomen: Soft, nontender, no hepatosplenomegaly, no distention.  Skin: Intact without  lesions or rashes.  Neurologic: Alert and oriented x 3.  Psych: Normal affect. Extremities: No clubbing or cyanosis.  HEENT: Normal.   Assessment/Plan:  1. Status post mechanical MVR:  Mitral valve replacement, stable from valve standpoint. Echo 06/25 with preserved EF and normal functioning mitral valve prosthesis.  She is on coumadin  goal INR 2.5 - 3.5 and ASA 81 mg daily. INR followed at [facility].   No bleeding issues.  2.  Chronic diastolic CHF/Pulmonary HTN:  Pulmonary HTN was likely a mixed picture, from mitral valve disease as well as mixed connective tissue disease and possibly WHO group 3 from COPD.  She had mild residual pulmonary HTN with moderately increased PVR even after mitral valve surgery.  The residual pulmonary HTN may be due to pulmonary vascular remodeling with chronic left atrial pressure elevation in the setting of mitral valve disease. Revatio  was restarted at 20 mg tid.  Echo in 8/20 showed RV normal but unable to estimate PA systolic pressure.  Echo in 11/21 showed normal RV with PASP 25 mm Hg.   She is now off sildenafil .  RHC in 10/22 showed normal filling pressures and normal PA pressure (off sildenafil ). However, PASP estimated at 40 mmHg and RV mildly reduced on most recent echo in 6/25. NYHA class II now, doing better on Lasix  20 mg daily but still looks at least mildly volume overloaded.  - Increase Lasix  to 40 mg daily and KCl to 40 mEq daily. BMET/BNP today, BMET in 10 days.  - With elevated PA pressure by echo and volume overload, I am going to arrange for RHC to assess filling pressures and PA pressure.  We discussed risks/benefits and she agrees to procedure.  - Continue CPAP for OSA.  3. Mixed connective tissue disorder: Patient has history of pulmonary fibrosis, but most recent CT in 2/22 showed emphysema and no significant fibrosis. Followed by rheumatology at [facility]. Unable to come off prednisone. Will request most recent note for  review. - On prednisone and azathioprine. Now also on Saphnelo once monthly infusion. 4. COPD: Emphysema on 2/22 CT chest.  Thought to be due to passive smoking.  HRCT in 8/24 with no definite ILD but tracheobronchomalacia and moderate-severe emphysema noted.  -  Trelegy has helped  5. OSA: Continue CPAP 6. Hyperlipidemia: With coronary calcification on CT, would like to see LDL < 55.  - Check lipids, will start statin depending on result.   Follow up in 6 weeks with APP.   I spent 41 minutes reviewing records, interviewing/examining patient, and managing orders.   Ezra Shuck  05/01/2024

## 2024-05-02 MED ORDER — ROSUVASTATIN CALCIUM 10 MG PO TABS: 10.0000 mg | ORAL_TABLET | Freq: Every day | ORAL | 3 refills | Status: AC

## 2024-05-15 ENCOUNTER — Telehealth (HOSPITAL_COMMUNITY): Payer: Self-pay

## 2024-05-17 ENCOUNTER — Telehealth (HOSPITAL_COMMUNITY): Payer: Self-pay

## 2024-05-17 ENCOUNTER — Ambulatory Visit (HOSPITAL_COMMUNITY)
Admission: RE | Admit: 2024-05-17 | Discharge: 2024-05-17 | Disposition: A | Discharge status: Home / Self Care | Attending: Cardiology | Admitting: Cardiology

## 2024-05-17 ENCOUNTER — Telehealth (HOSPITAL_COMMUNITY): Payer: Self-pay | Admitting: Pharmacist

## 2024-05-17 ENCOUNTER — Other Ambulatory Visit (HOSPITAL_COMMUNITY): Payer: Self-pay

## 2024-05-17 ENCOUNTER — Other Ambulatory Visit: Payer: Self-pay

## 2024-05-17 ENCOUNTER — Encounter (HOSPITAL_COMMUNITY)
Admission: RE | Disposition: A | Discharge status: Home / Self Care | Payer: Self-pay | Source: Home / Self Care | Attending: Cardiology

## 2024-05-17 ENCOUNTER — Other Ambulatory Visit (HOSPITAL_BASED_OUTPATIENT_CLINIC_OR_DEPARTMENT_OTHER): Payer: Self-pay

## 2024-05-17 DIAGNOSIS — Z7901 Long term (current) use of anticoagulants: Secondary | ICD-10-CM | POA: Insufficient documentation

## 2024-05-17 DIAGNOSIS — Z79899 Other long term (current) drug therapy: Secondary | ICD-10-CM | POA: Insufficient documentation

## 2024-05-17 DIAGNOSIS — E785 Hyperlipidemia, unspecified: Secondary | ICD-10-CM | POA: Diagnosis not present

## 2024-05-17 DIAGNOSIS — J449 Chronic obstructive pulmonary disease, unspecified: Secondary | ICD-10-CM | POA: Diagnosis not present

## 2024-05-17 DIAGNOSIS — Z952 Presence of prosthetic heart valve: Secondary | ICD-10-CM | POA: Diagnosis not present

## 2024-05-17 DIAGNOSIS — I272 Pulmonary hypertension, unspecified: Secondary | ICD-10-CM | POA: Diagnosis present

## 2024-05-17 DIAGNOSIS — J8410 Pulmonary fibrosis, unspecified: Secondary | ICD-10-CM | POA: Diagnosis not present

## 2024-05-17 DIAGNOSIS — I2510 Atherosclerotic heart disease of native coronary artery without angina pectoris: Secondary | ICD-10-CM | POA: Diagnosis not present

## 2024-05-17 DIAGNOSIS — G4733 Obstructive sleep apnea (adult) (pediatric): Secondary | ICD-10-CM | POA: Insufficient documentation

## 2024-05-17 DIAGNOSIS — Z79624 Long term (current) use of inhibitors of nucleotide synthesis: Secondary | ICD-10-CM | POA: Insufficient documentation

## 2024-05-17 DIAGNOSIS — I270 Primary pulmonary hypertension: Secondary | ICD-10-CM

## 2024-05-17 DIAGNOSIS — Z7982 Long term (current) use of aspirin: Secondary | ICD-10-CM | POA: Diagnosis not present

## 2024-05-17 HISTORY — PX: RIGHT HEART CATH: CATH118263

## 2024-05-17 LAB — BASIC METABOLIC PANEL WITH GFR
Anion gap: 7 (ref 5–15)
BUN: 13 mg/dL (ref 6–20)
CO2: 30 mmol/L (ref 22–32)
Calcium: 8.7 mg/dL — ABNORMAL LOW (ref 8.9–10.3)
Chloride: 103 mmol/L (ref 98–111)
Creatinine, Ser: 0.89 mg/dL (ref 0.44–1.00)
GFR, Estimated: >60 mL/min (ref >60)
Glucose, Bld: 89 mg/dL (ref 70–99)
Potassium: 4.5 mmol/L (ref 3.5–5.1)
Sodium: 140 mmol/L (ref 135–145)

## 2024-05-17 LAB — POCT I-STAT 7, (LYTES, BLD GAS, ICA,H+H)
Acid-Base Excess: 2 mmol/L (ref 0.0–2.0)
Acid-Base Excess: 2 mmol/L (ref 0.0–2.0)
Bicarbonate: 29.5 mmol/L — ABNORMAL HIGH (ref 20.0–28.0)
Bicarbonate: 29.6 mmol/L — ABNORMAL HIGH (ref 20.0–28.0)
Calcium, Ion: 1.2 mmol/L (ref 1.15–1.40)
Calcium, Ion: 1.15 mmol/L (ref 1.15–1.40)
HCT: 42 % (ref 36.0–46.0)
HCT: 42 % (ref 36.0–46.0)
Hemoglobin: 14.3 g/dL (ref 12.0–15.0)
Hemoglobin: 14.3 g/dL (ref 12.0–15.0)
O2 Saturation: 69 %
O2 Saturation: 67 %
Potassium: 3.7 mmol/L (ref 3.5–5.1)
Potassium: 3.6 mmol/L (ref 3.5–5.1)
Sodium: 143 mmol/L (ref 135–145)
Sodium: 145 mmol/L (ref 135–145)
TCO2: 31 mmol/L (ref 22–32)
TCO2: 31 mmol/L (ref 22–32)
pCO2 arterial: 55.3 mmHg — ABNORMAL HIGH (ref 32–48)
pCO2 arterial: 55.1 mmHg — ABNORMAL HIGH (ref 32–48)
pH, Arterial: 7.335 — ABNORMAL LOW (ref 7.35–7.45)
pH, Arterial: 7.338 — ABNORMAL LOW (ref 7.35–7.45)
pO2, Arterial: 39 mmHg — CL (ref 83–108)
pO2, Arterial: 38 mmHg — CL (ref 83–108)

## 2024-05-17 LAB — PROTIME-INR
INR: 3.1 — ABNORMAL HIGH (ref 0.8–1.2)
Prothrombin Time: 33.4 s — ABNORMAL HIGH (ref 11.4–15.2)

## 2024-05-17 SURGERY — RIGHT HEART CATH
Anesthesia: LOCAL

## 2024-05-17 MED ORDER — SODIUM CHLORIDE 0.9% FLUSH: 3.0000 mL | Freq: Two times a day (BID) | INTRAVENOUS | Status: DC

## 2024-05-17 MED ORDER — LABETALOL HCL 5 MG/ML IV SOLN: 10.0000 mg | INTRAVENOUS | Status: DC | PRN

## 2024-05-17 MED ORDER — ACETAMINOPHEN 325 MG PO TABS: 650.0000 mg | ORAL_TABLET | ORAL | Status: DC | PRN

## 2024-05-17 MED ORDER — SODIUM CHLORIDE 0.9% FLUSH
3.0000 mL | INTRAVENOUS | Status: DC | PRN
Start: 2024-05-17 — End: 2024-05-17

## 2024-05-17 MED ORDER — LIDOCAINE HCL (PF) 1 % IJ SOLN
INTRAMUSCULAR | Status: AC
  Filled 2024-05-17: qty 30

## 2024-05-17 MED ORDER — SODIUM CHLORIDE 0.9 % IV SOLN: 250.0000 mL | INTRAVENOUS | Status: DC | PRN

## 2024-05-17 MED ORDER — TADALAFIL 20 MG PO TABS
20.0000 mg | ORAL_TABLET | Freq: Every day | ORAL | 11 refills | Status: AC
  Filled 2024-05-17: qty 30, 30d supply, fill #0
  Filled 2024-06-12: qty 30, 30d supply, fill #1
  Filled 2024-07-12: qty 30, 30d supply, fill #2
  Filled 2024-08-11: qty 30, 30d supply, fill #3
  Filled 2024-10-02 (×2): qty 30, 30d supply, fill #4
  Filled 2024-10-27: qty 30, 30d supply, fill #5

## 2024-05-17 MED ORDER — HEPARIN (PORCINE) IN NACL 1000-0.9 UT/500ML-% IV SOLN
INTRAVENOUS | Status: DC | PRN
Start: 2024-05-17 — End: 2024-05-17
  Administered 2024-05-17: 500 mL

## 2024-05-17 MED ORDER — HYDRALAZINE HCL 20 MG/ML IJ SOLN: 10.0000 mg | INTRAMUSCULAR | Status: DC | PRN

## 2024-05-17 MED ORDER — ONDANSETRON HCL 4 MG/2ML IJ SOLN: 4.0000 mg | Freq: Four times a day (QID) | INTRAMUSCULAR | Status: DC | PRN

## 2024-05-17 MED ORDER — LIDOCAINE HCL (PF) 1 % IJ SOLN
INTRAMUSCULAR | Status: DC | PRN
  Administered 2024-05-17: 2 mL

## 2024-05-17 MED ORDER — SODIUM CHLORIDE 0.9 % IV SOLN: INTRAVENOUS | Status: DC

## 2024-05-17 SURGICAL SUPPLY — 5 items
CATH BALLN WEDGE 5F 110CM (CATHETERS) IMPLANT
PACK CARDIAC CATHETERIZATION (CUSTOM PROCEDURE TRAY) ×1 IMPLANT
SHEATH GLIDE SLENDER 4/5FR (SHEATH) IMPLANT
TRANSDUCER W/STOPCOCK (MISCELLANEOUS) IMPLANT
TUBING ART PRESS 72 MALE/FEM (TUBING) IMPLANT

## 2024-05-17 NOTE — Discharge Instructions (Signed)

## 2024-05-17 NOTE — Telephone Encounter (Signed)
 Received message from Dr. Rolan that he would like patient started on tadalafil  20 mg daily for [facility] based off elevated PA pressures from RHC today. Attempted to call patient to inform her of plan. Left VM.   Tinnie Redman, PharmD, BCPS, BCCP, CPP Heart Failure Clinic Pharmacist [phone]

## 2024-05-17 NOTE — Telephone Encounter (Signed)
 Patient returned call regarding tadalafil . She would like the medication filled at [facility] and shipped to her. Address in chart was confirmed. Medication sent to [facility].  Tinnie Redman, PharmD, BCPS, BCCP, CPP Heart Failure Clinic Pharmacist [phone]

## 2024-05-17 NOTE — Interval H&P Note (Signed)
 History and Physical Interval Note:  05/17/2024 9:37 AM  Katie [name] Search  has presented today for surgery, with the diagnosis of hp.  The various methods of treatment have been discussed with the patient and family. After consideration of risks, benefits and other options for treatment, the patient has consented to  Procedure(s): RIGHT HEART CATH (N/A) as a surgical intervention.  The patient's history has been reviewed, patient examined, no change in status, stable for surgery.  I have reviewed the patient's chart and labs.  Questions were answered to the patient's satisfaction.     Angelique Chevalier [employer]

## 2024-05-17 NOTE — Telephone Encounter (Signed)
 Advanced Heart Failure Patient Advocate Encounter  Prior authorization for Tadalafil  [facility]) has been submitted and approved. Test billing returns rejection: required fill with specialty pharmacy.  Key: [id number] Effective: 05/17/2024 to 05/17/2025  Rachel DEL, CPhT Rx Patient Advocate Phone: [phone]

## 2024-05-18 ENCOUNTER — Other Ambulatory Visit: Payer: Self-pay

## 2024-05-18 ENCOUNTER — Encounter (HOSPITAL_COMMUNITY): Payer: Self-pay | Admitting: Cardiology

## 2024-05-28 ENCOUNTER — Telehealth (HOSPITAL_COMMUNITY): Payer: Self-pay | Admitting: Cardiology

## 2024-05-28 NOTE — Telephone Encounter (Signed)
 BP in 90s is not terribly low if not lightheaded.  Move tadalafil  to evening.

## 2024-05-28 NOTE — Telephone Encounter (Signed)
 Patient called to report during infusion today b/p dropped to 92/50  -denies symptoms (light headed or dizzy) -reports b/p is normally low SBP 100's -reports recent increase in furosemide  -please advise if changes are needed   Patient also wanted to report muscle weakness with new medication Reports she takes meds in the AM  -please advise if changes are needed

## 2024-05-30 NOTE — Telephone Encounter (Signed)
 Pt aware and voiced understanding

## 2024-06-04 ENCOUNTER — Encounter (INDEPENDENT_AMBULATORY_CARE_PROVIDER_SITE_OTHER): Admitting: Ophthalmology

## 2024-06-05 ENCOUNTER — Encounter (INDEPENDENT_AMBULATORY_CARE_PROVIDER_SITE_OTHER): Payer: BC Managed Care – PPO | Admitting: Ophthalmology

## 2024-06-06 NOTE — Progress Notes (Signed)
 [facility] - Clinic Note  06/08/2024     CHIEF COMPLAINT Patient presents for Retina Follow Up    HISTORY OF PRESENT ILLNESS: Katie [name] is a 58 y.o. female who presents to the clinic today for:   HPI     Retina Follow Up   Diagnosis: vitreomacular traction.  In both eyes.  This started 9 months ago.  I, the attending physician,  performed the HPI with the patient and updated documentation appropriately.        Comments   Pt states no concerns with vision. Pt had yag laser sx in OD yesterday, left eye will be done in 3 wks. Pt uses refresh every day ou.      Last edited by Valdemar Rogue, MD on 06/10/2024  1:23 AM.     Patient states that she had a YAG OD yesterday 08.28.25. She feels the laser has helped.   Referring physician: Royden Ronal Czar, FNP [address] [place],  [place] [address]  HISTORICAL INFORMATION:   Selected notes from the MEDICAL RECORD NUMBER Referred by Dr. Medford Ferrier for concern of plaquenil toxicity LEE:  Ocular Hx- PMH-   CURRENT MEDICATIONS: No current outpatient medications on file. (Ophthalmic Drugs)   No current facility-administered medications for this visit. (Ophthalmic Drugs)   Current Outpatient Medications (Other)  Medication Sig   acetaminophen  (TYLENOL ) 325 MG tablet Take 650 mg by mouth every 6 (six) hours as needed for moderate pain.   anifrolumab-fnia 300 mg in sodium chloride  0.9 % Inject 300 mg into the vein every 30 (thirty) days. Saphnelo   aspirin  81 MG EC tablet Take 81 mg by mouth daily.   azaTHIOprine (IMURAN) 50 MG tablet Take 50 mg by mouth daily.    Calcium  Carb-Cholecalciferol (CALCIUM  600+D3 PO) Take 1 tablet by mouth every morning.   cholecalciferol (VITAMIN D3) 25 MCG (1000 UNIT) tablet Take 1,000 Units by mouth daily.   Cyanocobalamin (B-12) 1000 MCG TABS Take 1,000 mcg by mouth daily.   Fluticasone-Umeclidin-Vilant (TRELEGY ELLIPTA ) 100-62.5-25 MCG/ACT AEPB  Inhale 1 puff into the lungs daily.   folic acid (FOLVITE) 800 MCG tablet Take 800 mcg by mouth daily.   furosemide  (LASIX ) 20 MG tablet Take 2 tablets (40 mg total) by mouth daily.   Magnesium  250 MG TABS Take 250 mg by mouth daily.   metoprolol  succinate (TOPROL -XL) 25 MG 24 hr tablet Take 0.5 tablets (12.5 mg total) by mouth daily.   Multiple Vitamin (MULTIVITAMIN) tablet Take 1 tablet by mouth daily.   potassium chloride  SA (KLOR-CON  M20) 20 MEQ tablet Take 2 tablets (40 mEq total) by mouth daily.   predniSONE (DELTASONE) 5 MG tablet Take 5 mg by mouth daily.   rosuvastatin  (CRESTOR ) 10 MG tablet Take 1 tablet (10 mg total) by mouth daily.   tadalafil  (CIALIS ) 20 MG tablet Take 1 tablet (20 mg total) by mouth daily.   valACYclovir (VALTREX) 1000 MG tablet Take 1,000 mg by mouth daily as needed (fever blisters).   warfarin (COUMADIN ) 5 MG tablet Take as directed by the anticoagulation clinic. (Patient taking differently: Take 5-7.5 mg by mouth See admin instructions. Take as directed by the anticoagulation clinic. 7.5 mg Tues Thur and Sat, 5 mg all other days)   No current facility-administered medications for this visit. (Other)   REVIEW OF SYSTEMS: ROS   Positive for: Musculoskeletal, Cardiovascular, Eyes Negative for: Constitutional, Gastrointestinal, Neurological, Skin, Genitourinary, HENT, Endocrine, Respiratory, Psychiatric, Allergic/Imm, Heme/Lymph Last edited by  Elnor Avelina RAMAN, COT on 06/08/2024  9:30 AM.       ALLERGIES Allergies  Allergen Reactions   Cephalexin Hives   Methotrexate Nausea And Vomiting   PAST MEDICAL HISTORY Past Medical History:  Diagnosis Date   Abscess of right breast    sternal osteomyeliitis, pseudomonas   C. 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    Cataract    Mixed OU   CHF (congestive heart  failure) (HCC)    secondary to mmoderate mitral stenosis and moderate mitral regurgitation in the setting of rheumatic vlave disease as well as severe pulmonary HTN likely due to a combination of MV disease and pulmoanary arterial HTN in the setting of collagen vascular disease. Not valvuloplasty candidate due to MR. patient had MV replacement with mechanical MV in 8/11.   CHF (congestive heart failure) (HCC)    Echo post-op showed normally functioning mechanical MV, EF 60-65% with septal bounce, small mobile structure in the mid ventricle (? loose papillary muscle), RV-RA gradient 22 mmHg. Echo (10/11): EF 60-65%, normally functioning mechanical mitral vlavue.    Hypertensive retinopathy    OU   Mixed connective tissue disease (HCC)    diagnosis 1990. 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 CTA chest (7/10) with no evidence for pulmonary emboli or chronic pulmonary emboli. PA pressure 72/28 mean 50 with PVR 5.7 WU on initial RHC, PA pressure 45/21 mean 30 PVR 2.3 WU on f/u RHC on Revatio  80 mg three times a day (1/11).  After MV surgery, Revatio  stopped.   Pulmonary HTN (HCC)    increased  dyspnea. Repeat RHC with mean RA 6, PA 42/20 mean 29, PVR 4.8 WU,  2.3. Revatio  20 mg  3x a day restarted. 3/11 6 min walk 464m. 2/12 6 min walk 427 m.    Raynaud's syndrome    Restrictive lung disease    secondary to MCTD. PFTs 03/06/99 VC 47% DLCO 26%. PFTs 09/12/08 VC 43% DLCO 45%. PFTs 6/10 FVC 37%, FEV1 34%, ratio 69%, TLC 50%. using home O2 periodically   Rheumatic fever    Warfarin anticoagulation    for mechanical MV, goal INR 2.5-3.5   Past Surgical History:  Procedure Laterality Date   BREAST CYST ASPIRATION  03/09/2012   CESAREAN SECTION     COLONOSCOPY     RIGHT HEART CATH N/A 08/10/2021    Procedure: RIGHT HEART CATH;  Surgeon: Rolan Ezra RAMAN, MD;  [facility] IN[facility]AS[facility] CV LAB;  Service: Cardiovascular;  Laterality: N/A;   RIGHT HEART CATH N/A 05/17/2024   Procedure: RIGHT HEART CATH;  Surgeon: Rolan Ezra RAMAN, MD;  Location: [facility] INVASIVE CV LAB;  Service: Cardiovascular;  Laterality: N/A;   s/p mitral valvue replacement  05/13/2010   by Dr. Dusty by minimally invasive window.    TUBAL LIGATION     FAMILY HISTORY Family History  Problem Relation Age of Onset   Emphysema Mother        smoker   COPD Mother    Heart disease Maternal Grandmother        side of family not given   Diabetes Maternal Grandmother    Hypertension Father  Osteoarthritis Father    Sleep apnea Father    Breast cancer Maternal Aunt    SOCIAL HISTORY Social History   Tobacco Use   Smoking status: Never    Passive exposure: Past   Smokeless tobacco: Never  Vaping Use   Vaping status: Never Used  Substance Use Topics   Alcohol use: No   Drug use: No       OPHTHALMIC EXAM:  Base Eye Exam     Visual Acuity (Snellen - Linear)       Right Left   Dist [place] 20/25 -2 20/20   Dist ph [place] NI          Tonometry (Tonopen, 9:24 AM)       Right Left   Pressure 20 19         Pupils       Pupils Dark Light   Right PERRL 3 2   Left PERRL           Neuro/Psych     Oriented x3: Yes   Mood/Affect: Normal         Dilation     Both eyes: 1.0% Mydriacyl, 2.5% Phenylephrine @ 9:24 AM           Slit Lamp and Fundus Exam     External Exam       Right Left   External mild accessory lashes upper lid mild accessory lashes upper lid         Slit Lamp Exam       Right Left   Lids/Lashes Dermatochalasis - upper lid, mild Meibomian gland dysfunction Dermatochalasis - upper lid, mild Meibomian gland dysfunction   Conjunctiva/Sclera trace melanosis Mild melanosis   Cornea trace PEE, Well healed cataract wound mild tear film debris, Well healed cataract wound, 1+ Punctate  epithelial erosions   Anterior Chamber Deep and clear, narrow temporal angle, No cell or flare Deep and clear, narrow temporal angle, No cell or flare   Iris Round and dilated, PI 1130 Round and dilated, PI 100   Lens PC IOL in good postion with open PC PC IOL in good postition, trace Posterior capsular opacification   Anterior Vitreous Vitreous syneresis Vitreous syneresis         Fundus Exam       Right Left   Disc trace pallor, Sharp, +cupping trace pallor, Sharp, +cupping, mild PPP   C/D Ratio 0.7 0.8   Macula Flat, blunted foveal reflex, RPE mottling and clumping, +small central cyst, No heme Flat, blunted foveal reflex, RPE mottling and clumping, trace cystic changes centrally- improved, No heme   Vessels attenuated, mild tortuosity attenuated, mild tortuosity   Periphery Attached, No heme Attached, no heme           IMAGING AND PROCEDURES  Imaging and Procedures for @TODAY @  OCT, Retina - OU - Both Eyes       Right Eye Quality was good. Central Foveal Thickness: 259. Progression has improved. Findings include no SRF, abnormal foveal contour, intraretinal fluid, vitreous traction (persistent VMT with central cysts; no ellipsoid loss -- mild interval release).   Left Eye Quality was good. Central Foveal Thickness: 216. Progression has improved. Findings include no SRF, abnormal foveal contour, intraretinal fluid, vitreous traction (Interval release of VMT with interval improvement of foveal contour and central cystic changes; mild ellipsoid thinning parafoveal).   Notes *Images captured and stored on drive  Diagnosis / Impression:  OD: persistent VMT with central cysts; no ellipsoid loss -  mild interval release OS: Persistent VMT with Interval release of VMT with interval improvement of foveal contour and central cystic changes; no ellipsoid loss -- mild ellipsoid thinning parafoveal  Clinical management:  See below  Abbreviations: NFP - Normal foveal profile. CME -  cystoid macular edema. PED - pigment epithelial detachment. IRF - intraretinal fluid. SRF - subretinal fluid. EZ - ellipsoid zone. ERM - epiretinal membrane. ORA - outer retinal atrophy. ORT - outer retinal tubulation. SRHM - subretinal hyper-reflective material            ASSESSMENT/PLAN:   ICD-10-CM   1. Vitreomacular adhesion of both eyes  H43.823 OCT, Retina - OU - Both Eyes    2. Long-term use of Plaquenil  Z79.899     3. Essential hypertension  I10     4. Anatomical narrow angle glaucoma  H40.039     5. Hypertensive retinopathy of both eyes  H35.033     6. Pseudophakia of both eyes  Z96.1     7. Bilateral dry eyes  H04.123      1,2. VMT OU - OCT shows OD: persistent VMT with central cysts; no ellipsoid loss - mild interval release; OS: Persistent VMT with Interval release of VMT with interval improvement of foveal contour and central cystic changes; no ellipsoid loss -- mild ellipsoid thinning parafoveal  - pt denies significant vision changes or metamorphopsia  - BCVA 20/25 OD, 20/20 OS - discussed findings, prognosis, including possible development of macular hole  - continue amsler grid monitoring  - f/u in 9 months, sooner prn -- DFE, OCT  3. 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 Hypertensive retinopathy OU  - discussed importance of tight BP control  - continue to monitor  5. Narrow angle glaucoma OU  - IOP today: 20, 19  - patent PI's OU  - under the expert management of  Dr. Fleeta  6. Pseudophakia OU  - s/p CE/IOL, Dr. Fleeta (OD 11.12.24, OS 10.01.24)  - IOL in good position, doing well  - YAG OD 08.28.25  - monitor   7. Dry eyes OU - recommend artificial tears and lubricating ointment as needed   Ophthalmic Meds Ordered this visit:  No orders of the defined types were placed in this encounter.    Return in about 9 months (around 03/08/2025) for f/u VMT OU , DFE, OCT.  There are no Patient Instructions on file for this visit.  This document serves as a record of services personally performed by Redell JUDITHANN Hans, MD, PhD. It was created on their behalf by Delon Newness COT, an ophthalmic technician. The creation of this record is the provider's dictation and/or activities during the visit.    Electronically signed by: Delon Newness COT 08.27.251:23 AM  This document serves as a record of services personally performed by Redell JUDITHANN Hans, MD, PhD. It was created on their behalf by Wanda GEANNIE Keens, COT an ophthalmic technician. The creation of this record is the provider's dictation and/or  activities during the visit.    Electronically signed by:  Wanda GEANNIE Keens, COT  06/10/24 1:23 AM  Redell JUDITHANN Hans, M.D., Ph.D. Diseases & Surgery of the Retina and Vitreous [facility] 06/08/2024   I have reviewed the above documentation for accuracy and completeness, and I agree with the above. Redell JUDITHANN Hans, M.D., Ph.D. 06/10/24 1:25 AM   Abbreviations: M myopia (nearsighted); A astigmatism; H hyperopia (farsighted); P presbyopia; Mrx spectacle prescription;  CTL contact lenses; OD right eye; OS left eye; OU both eyes  XT exotropia; ET esotropia; PEK punctate epithelial keratitis; PEE punctate epithelial erosions; DES dry eye syndrome; MGD meibomian gland dysfunction; ATs artificial tears; PFAT's preservative free artificial tears; NSC nuclear sclerotic cataract; PSC posterior subcapsular cataract; ERM epi-retinal membrane;  PVD posterior vitreous detachment; RD retinal detachment; DM diabetes mellitus; DR diabetic retinopathy; NPDR non-proliferative diabetic retinopathy; PDR proliferative diabetic retinopathy; CSME clinically significant macular edema; DME diabetic macular edema; dbh dot blot hemorrhages; CWS cotton wool spot; POAG primary open angle glaucoma; C/D cup-to-disc ratio; HVF humphrey visual field; GVF goldmann visual field; OCT optical coherence tomography; IOP intraocular pressure; BRVO Branch retinal vein occlusion; CRVO central retinal vein occlusion; CRAO central retinal artery occlusion; BRAO branch retinal artery occlusion; RT retinal tear; SB scleral buckle; PPV pars plana vitrectomy; VH Vitreous hemorrhage; PRP panretinal laser photocoagulation; IVK intravitreal kenalog; VMT vitreomacular traction; MH Macular hole;  NVD neovascularization of the disc; NVE neovascularization elsewhere; AREDS age related eye disease study; ARMD age related macular degeneration; POAG primary open angle glaucoma; EBMD epithelial/anterior basement membrane dystrophy; ACIOL anterior chamber intraocular lens; IOL intraocular lens; PCIOL posterior chamber intraocular lens; Phaco/IOL phacoemulsification with intraocular lens placement; PRK photorefractive keratectomy; LASIK laser assisted in situ keratomileusis; HTN hypertension; DM diabetes mellitus; COPD chronic obstructive pulmonary disease

## 2024-06-08 ENCOUNTER — Telehealth (HOSPITAL_COMMUNITY): Payer: Self-pay | Admitting: *Deleted

## 2024-06-08 ENCOUNTER — Ambulatory Visit (INDEPENDENT_AMBULATORY_CARE_PROVIDER_SITE_OTHER): Admitting: Ophthalmology

## 2024-06-08 ENCOUNTER — Encounter (INDEPENDENT_AMBULATORY_CARE_PROVIDER_SITE_OTHER): Payer: Self-pay | Admitting: Ophthalmology

## 2024-06-08 DIAGNOSIS — H43823 Vitreomacular adhesion, bilateral: Secondary | ICD-10-CM

## 2024-06-08 DIAGNOSIS — I10 Essential (primary) hypertension: Secondary | ICD-10-CM

## 2024-06-08 DIAGNOSIS — H35033 Hypertensive retinopathy, bilateral: Secondary | ICD-10-CM

## 2024-06-08 DIAGNOSIS — Z961 Presence of intraocular lens: Secondary | ICD-10-CM

## 2024-06-08 DIAGNOSIS — H40039 Anatomical narrow angle, unspecified eye: Secondary | ICD-10-CM

## 2024-06-08 DIAGNOSIS — Z79899 Other long term (current) drug therapy: Secondary | ICD-10-CM | POA: Diagnosis not present

## 2024-06-08 DIAGNOSIS — H04123 Dry eye syndrome of bilateral lacrimal glands: Secondary | ICD-10-CM

## 2024-06-08 NOTE — Progress Notes (Signed)
 [facility]  PCP: Dr. Clarice HF Cardiology: Dr. Rolan  58 y.o. 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    Echo in 1/19 showed EF 60-65%, mechanical mitral valve with mean gradient 5 mmHg, and PA systolic pressure 33 mmHg with normal RV.  Echo in 8/20 showed EF 50-55%, normal RV, mechanical MV with mean gradient 6 mmHg, unable to estimate PA systolic pressure, normal RV.  Echo in 11/21 showed EF 60-65%, RV probably normal, PASP 25 mmHg, mechanical MV with mean gradient 5 mmHg.   RHC was done in 10/22 after stopping sildenafil . This showed normal filling pressures and normal PA pressure.   Echo 2/24 EF 55-60%, mildly decreased RV systolic function, PASP 33 mmHg, mechanical mitral valve with no MR and stable mean gradient 5 mmHg.   Echo 6/25 showed EF 55-60%, RV mildly reduced systolic function, PASP 40 mmHg, mechanical mitral valve with mean gradient 2 mmHg and normal IVC.   RHC 8/25 showed mild pulmonary hypertension, normal R/L filling pressures and preserved CO. Suspect mixed WHO group 1 (mixed connective tissue disorder) and group 3 with possible component group 2 pulmonary hypertension. Started on tadalafil  20 mg daily.  Today she returns for HF follow up. Overall feeling fine. She has SOB walking up an inclines or steps. She has rare atypical chest discomfort, not exertional. She has ankle swelling. Denies palpitations, abnormal bleeding, dizziness, or PND/Orthopnea. Appetite ok. Weight at home 196 pounds. Taking all medications. 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 She wears CPAP.  ECG (personally reviewed): none ordered today  Labs (6/25): K 4.4, creatinine 0.8, BNP 78 Labs (8/25): K 4.5, creatinine 0.89,  LDL 98  Past Medical History: 1. Mixed connective tissue disease: diagnosis 1990. 8/10 ANA positive, anti-dsDNA positive, anti-SCL70 negative 2. 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     - PFT's 03/06/99  VC 47%  DLC0 26%    - PFT's 09/12/08  VC 43%  DLC0 45%    - PFT's 6/10 FVC 37%, FEV1 34%, ratio 69%, TLC 50%    - PFTs 11/15 FVC 39%, FEV1 35%, ratio 89%, TLC 63%, DLCO 48%    - CT chest in 2/22 showed emphysema but no ILD.  4. Nephrotic syndrome: secondary to MCTD 5. CHF: Secondary to moderate mitral stenosis and moderate mitral regurgitation in the setting of rheumatic mitral valve disease as well as severe pulmonary HTN likely due to a combination of MV disease and pulmonary arterial HTN in the setting of collagen vascular disease.   Not valvuloplasty candidate due to MR.  Patient had MV replacement with mechanical MV in 8/11.  Echo (8/11) post-op showed normally functioning mechanical MV, EF 60-65% with septal bounce, small mobile structure in the mid ventricle (? loose papillary muscle), RV-RA gradient 22 mmHg.  Echo (10/11): EF 60-65%, normally functioning mechanical mitral valve.  Echo (7/13): EF 55-60%, mechanical mitral valve with mean gradient 4 mmHg, PA systolic pressure 29 mmHg. Echo (5/15) with EF 55-60%, mechanical mitral valve functioning normally, normal RV size and systolic function, PA systolic pressure 32 mmHg.  - Echo in 5/17 showed EF 60-65%, normal mechanical mitral valve, and PA systolic pressure 33 mmHg with normal RV.  - Echo (1/19): EF 60-65%, mild LVH, normal RV size  and systolic function, mechanical mitral valve with mean gradient 6 mmHg, PASP 33 mmHg.  - Echo (8/20): EF 50-55%, normal RV, mechanical MV with mean gradient 6 mmHg, unable to estimate PA systolic pressure, normal RV.  No change from prior.  - Echo (11/21): EF 60-65%, RV probably normal, PASP 25 mmHg, mechanical MV with mean gradient 5 mmHg. - Echo (2/24):  EF 55-60%, mildly decreased RV systolic function, PASP 33 mmHg, mechanical mitral valve with no MR and stable mean gradient 5 mmHg.  - Echo (6/25): EF 55-60%, RV mildly reduced systolic function, PASP 40 mmHg, mechanical mitral valve with mean gradient 2 mmHg and normal IVC.  6.  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  CTA chest (7/10) with no evidence for pulmonary emboli or chronic pulmonary emboli.  PA pressure 72/28 mean 50 with PVR 5.7 WU on initial RHC, PA pressure 45/21 mean 30 PVR 2.3 WU on f/u RHC on Revatio  80 mg three times a day (1/11).  After MV surgery, Revatio  stopped.  Increased dyspnea.  Repeat RHC with mean RA 6, PA 42/20 (mean 29), PVR 4.8 WU, CI 2.3.  Revatio  20 mg three times a day restarted.  Echo (7/13) with PA systolic pressure 29 mmHg. Echo (5/15) with PA systolic pressure 32 mmHg.  - RHC (10/22): mean RA 1, PA 28/8 mean 18, mean PCWP 7, CI 2.83, PVR 2 WU.  - RHC (8/25): RA 5, PA 44/15 (25), PCWP mean 9, PAPi 5.8, CI (Fick) 2.33 7.  Rheumatic fever 8.  LHC (9/10): no angiographic CAD.  9.  Warfarin anticoagulation for mechanical MV, goal INR 2.5-3.5 10. 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  11. 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 C difficile diarrhea 1/12 13. GERD 14. Holter (1/19): No significant arrhythmias.  15. COPD: CT chest in 2/22 showed emphysema but no ILD.  Suspect due to passive smoking.  - High resolution CT chest (8/24): Tracheobronchomalacia, moderate to severe emphysema, no ILD, +coronary calcification.   Family History: Emphysema mother, smoker.  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   ROS: All systems reviewed and negative except as noted in HPI.   Current Outpatient Medications  Medication Sig Dispense  Refill   acetaminophen  (TYLENOL ) 325 MG tablet Take 650 mg by mouth every 6 (six) hours as needed for moderate pain.     anifrolumab-fnia 300 mg in sodium chloride  0.9 % Inject 300 mg into the vein every 30 (thirty) days. Saphnelo     aspirin  81 MG EC tablet Take 81 mg by mouth daily.     azaTHIOprine (IMURAN) 50 MG tablet Take 50 mg by mouth daily.      Calcium  Carb-Cholecalciferol (CALCIUM  600+D3 PO) Take 1 tablet by mouth every morning.     cholecalciferol (VITAMIN D3) 25 MCG (1000 UNIT) tablet Take 1,000 Units by mouth daily.     Cyanocobalamin (B-12) 1000 MCG TABS Take 1,000 mcg by mouth daily.     Fluticasone-Umeclidin-Vilant (TRELEGY ELLIPTA ) 100-62.5-25 MCG/ACT AEPB Inhale 1 puff into the lungs daily. 60 each 3   folic acid (FOLVITE) 800 MCG tablet Take 800 mcg by mouth daily.     furosemide  (LASIX ) 20 MG tablet Take 2 tablets (40 mg total) by mouth daily. 180 tablet 3   Magnesium  250 MG TABS Take 250 mg by mouth daily.  metoprolol  succinate (TOPROL -XL) 25 MG 24 hr tablet Take 0.5 tablets (12.5 mg total) by mouth daily. 45 tablet 3   Multiple Vitamin (MULTIVITAMIN) tablet Take 1 tablet by mouth daily.     potassium chloride  SA (KLOR-CON  M20) 20 MEQ tablet Take 2 tablets (40 mEq total) by mouth daily. 180 tablet 3   predniSONE (DELTASONE) 5 MG tablet Take 5 mg by mouth daily.     rosuvastatin  (CRESTOR ) 10 MG tablet Take 1 tablet (10 mg total) by mouth daily. 90 tablet 3   tadalafil  (CIALIS ) 20 MG tablet Take 1 tablet (20 mg total) by mouth daily. 30 tablet 11   valACYclovir (VALTREX) 1000 MG tablet Take 1,000 mg by mouth daily as needed (fever blisters).     warfarin (COUMADIN ) 5 MG tablet Take as directed by the anticoagulation clinic. (Patient taking differently: Take 5-7.5 mg by mouth See admin instructions. Take as directed by the anticoagulation clinic. 7.5 mg Tues Thur and Sat, 5 mg all other days) 135 tablet 3   No current facility-administered medications for this encounter.    BP 116/68   Pulse 70   Ht 5' 5 (1.651 m)   Wt 88.4 kg (194 lb 12.8 oz)   SpO2 96%   BMI 32.42 kg/m   Wt Readings from Last 3 Encounters:  06/12/24 88.4 kg (194 lb 12.8 oz)  05/17/24 88.5 kg (195 lb)  05/01/24 89.5 kg (197 lb 6.4 oz)   Physical Exam General:  NAD. No resp difficulty, walked into clinic HEENT: Normal Neck: Supple. No JVD. Cor: Regular rate & rhythm. No rubs, gallops or murmurs. +mechanical S1 Lungs: Clear Abdomen: Soft, obese, nontender, nondistended.  Extremities: No cyanosis, clubbing, rash, 1+ pre-tibial BLE edema Neuro: Alert & oriented x 3, moves all 4 extremities w/o difficulty. Affect pleasant.  Assessment/Plan:  1. Status post mechanical MVR:  Mitral valve replacement, stable from valve standpoint. Echo 06/25 with preserved EF and normal functioning mitral valve prosthesis.  She is on coumadin  goal INR 2.5 - 3.5 and ASA 81 mg daily. INR followed at [facility].   No bleeding issues.  2.  Chronic diastolic CHF/Pulmonary HTN:  Pulmonary HTN was likely a mixed picture, from mitral valve disease as well as mixed connective tissue disease and possibly WHO group 3 from COPD.  She had mild residual pulmonary HTN with moderately increased PVR even after mitral valve surgery.  The residual pulmonary HTN may be due to pulmonary vascular remodeling with chronic left atrial pressure elevation in the setting of mitral valve disease. Revatio  was restarted at 20 mg tid.  Echo in 8/20 showed RV normal but unable to estimate PA systolic pressure.  Echo in 11/21 showed normal RV with PASP 25 mm Hg.  She was off sildenafil .  RHC in 10/22 showed normal filling pressures and normal PA pressure (off sildenafil ). However, PASP estimated at 40 mmHg and RV mildly reduced on echo in 6/25. RHC 8/25 showed mild pulmonary hypertension, normal R/L filling pressures and preserved CO, started on tadalafil . NYHA class II, she is not volume overloaded by exam, with mild LEE suspect she has  component of venous insufficiency. - Encouraged compression hose - Continue Lasix  40 mg daily + 40 KCL daily. Recent labs reviewed and are stable. - Continue tadalafil  20 mg daily. - Continue CPAP for OSA.  3. Mixed connective tissue disorder: Patient has history of pulmonary fibrosis, but most recent CT in 2/22 showed emphysema and no significant fibrosis. Followed by rheumatology at [facility].  Unable to come off prednisone. Will request most recent note for review. - On prednisone and azathioprine. Now also on Saphnelo once monthly infusion. 4. COPD: Emphysema on 2/22 CT chest.  Thought to be due to passive smoking.  HRCT in 8/24 with no definite ILD but tracheobronchomalacia and moderate-severe emphysema noted.  -  Trelegy has helped 5. OSA: Continue CPAP 6. Hyperlipidemia: With coronary calcification on CT, would like to see LDL < 55.  - Continue rosuvastatin  20 mg daily. She has LFT/lipids arranged.    Follow up in 4 months with Dr. Rolan Raisin [facility] FNP-BC 06/12/2024

## 2024-06-08 NOTE — Telephone Encounter (Signed)
 Called to confirm/remind patient of their appointment at the [facility] on 06/12/24.       Appointment:              [x] Confirmed             [] Left mess              [] No answer/No voice mail             [] Phone not in service   Patient reminded to bring all medications and/or complete list.   Confirmed patient has transportation. Gave directions, instructed to utilize valet parking.

## 2024-06-10 ENCOUNTER — Encounter (INDEPENDENT_AMBULATORY_CARE_PROVIDER_SITE_OTHER): Payer: Self-pay | Admitting: Ophthalmology

## 2024-06-12 ENCOUNTER — Other Ambulatory Visit (HOSPITAL_COMMUNITY): Payer: Self-pay

## 2024-06-12 ENCOUNTER — Ambulatory Visit: Admitting: Internal Medicine

## 2024-06-12 ENCOUNTER — Encounter (HOSPITAL_COMMUNITY): Payer: Self-pay

## 2024-06-12 ENCOUNTER — Ambulatory Visit (HOSPITAL_COMMUNITY)
Admission: RE | Admit: 2024-06-12 | Discharge: 2024-06-12 | Disposition: A | Discharge status: Home / Self Care | Source: Ambulatory Visit | Attending: Family Medicine | Admitting: Family Medicine

## 2024-06-12 VITALS — BP 116/68 | HR 70 | Ht 65.0 in | Wt 194.8 lb

## 2024-06-12 DIAGNOSIS — G4733 Obstructive sleep apnea (adult) (pediatric): Secondary | ICD-10-CM | POA: Insufficient documentation

## 2024-06-12 DIAGNOSIS — I5032 Chronic diastolic (congestive) heart failure: Secondary | ICD-10-CM | POA: Diagnosis not present

## 2024-06-12 DIAGNOSIS — M351 Other overlap syndromes: Secondary | ICD-10-CM | POA: Diagnosis not present

## 2024-06-12 DIAGNOSIS — J8410 Pulmonary fibrosis, unspecified: Secondary | ICD-10-CM | POA: Diagnosis not present

## 2024-06-12 DIAGNOSIS — Z952 Presence of prosthetic heart valve: Secondary | ICD-10-CM | POA: Insufficient documentation

## 2024-06-12 DIAGNOSIS — Z79899 Other long term (current) drug therapy: Secondary | ICD-10-CM | POA: Diagnosis not present

## 2024-06-12 DIAGNOSIS — I272 Pulmonary hypertension, unspecified: Secondary | ICD-10-CM | POA: Diagnosis not present

## 2024-06-12 DIAGNOSIS — Z7982 Long term (current) use of aspirin: Secondary | ICD-10-CM | POA: Insufficient documentation

## 2024-06-12 DIAGNOSIS — J439 Emphysema, unspecified: Secondary | ICD-10-CM | POA: Insufficient documentation

## 2024-06-12 DIAGNOSIS — I059 Rheumatic mitral valve disease, unspecified: Secondary | ICD-10-CM | POA: Insufficient documentation

## 2024-06-12 DIAGNOSIS — Z7901 Long term (current) use of anticoagulants: Secondary | ICD-10-CM | POA: Diagnosis not present

## 2024-06-12 DIAGNOSIS — I270 Primary pulmonary hypertension: Secondary | ICD-10-CM | POA: Diagnosis not present

## 2024-06-12 DIAGNOSIS — J449 Chronic obstructive pulmonary disease, unspecified: Secondary | ICD-10-CM

## 2024-06-12 DIAGNOSIS — E785 Hyperlipidemia, unspecified: Secondary | ICD-10-CM | POA: Insufficient documentation

## 2024-06-12 NOTE — Patient Instructions (Signed)
 No change in medications. Return to see Dr. Rolan in 4 months.  PLEASE CALL [phone] IN DECEMBER TO SCHEDULE THIS APPOINTMENT. Please call [phone] option 2 if any questions or concerns prior to your next visit.

## 2024-06-20 ENCOUNTER — Ambulatory Visit: Admitting: Pulmonary Disease

## 2024-06-20 VITALS — BP 102/68 | HR 87 | Ht 65.0 in | Wt 194.6 lb

## 2024-06-20 DIAGNOSIS — J449 Chronic obstructive pulmonary disease, unspecified: Secondary | ICD-10-CM

## 2024-06-20 DIAGNOSIS — I272 Pulmonary hypertension, unspecified: Secondary | ICD-10-CM

## 2024-06-20 DIAGNOSIS — I5032 Chronic diastolic (congestive) heart failure: Secondary | ICD-10-CM

## 2024-06-20 DIAGNOSIS — I059 Rheumatic mitral valve disease, unspecified: Secondary | ICD-10-CM

## 2024-06-20 DIAGNOSIS — J439 Emphysema, unspecified: Secondary | ICD-10-CM

## 2024-06-20 DIAGNOSIS — R0609 Other forms of dyspnea: Secondary | ICD-10-CM

## 2024-06-20 DIAGNOSIS — G4733 Obstructive sleep apnea (adult) (pediatric): Secondary | ICD-10-CM | POA: Diagnosis not present

## 2024-06-20 DIAGNOSIS — Z8679 Personal history of other diseases of the circulatory system: Secondary | ICD-10-CM

## 2024-06-20 MED ORDER — TRELEGY ELLIPTA 100-62.5-25 MCG/ACT IN AEPB: 1.0000 | INHALATION_SPRAY | Freq: Every day | RESPIRATORY_TRACT | 3 refills | Status: AC

## 2024-06-20 NOTE — Progress Notes (Signed)
 Katie [name]    [id number]    03-10-1966  Primary Care Physician:Prevost, Ronal Czar, FNP  Referring Physician: Royden Ronal Czar, FNP 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   anifrolumab-fnia 300 mg in sodium chloride  0.9 % Inject 300 mg into the vein every 30 (thirty) days. Saphnelo   aspirin  81 MG EC tablet Take 81 mg by mouth daily.   azaTHIOprine (IMURAN) 50 MG tablet Take 50 mg by mouth daily.    Calcium  Carb-Cholecalciferol (CALCIUM  600+D3 PO) Take 1 tablet by mouth every morning.   cholecalciferol (VITAMIN D3) 25 MCG (1000 UNIT) tablet Take 1,000 Units by mouth daily.   Cyanocobalamin (B-12) 1000 MCG TABS Take 1,000 mcg by mouth daily.   Fluticasone-Umeclidin-Vilant (TRELEGY ELLIPTA ) 100-62.5-25 MCG/ACT AEPB Inhale 1 puff into the lungs daily.   folic acid (FOLVITE) 800 MCG tablet Take 800 mcg by mouth daily.   furosemide  (LASIX ) 20 MG tablet Take 2 tablets (40 mg total) by mouth daily.   Magnesium  250 MG TABS Take 250 mg by mouth daily.   metoprolol  succinate (TOPROL -XL) 25 MG 24 hr tablet Take 0.5 tablets (12.5 mg total) by mouth daily.   Multiple Vitamin (MULTIVITAMIN) tablet Take 1 tablet by mouth daily.   potassium chloride  SA (KLOR-CON  M20) 20 MEQ tablet Take 2 tablets (40 mEq total) by mouth daily.   predniSONE  (DELTASONE) 5 MG tablet Take 5 mg by mouth daily.   rosuvastatin  (CRESTOR ) 10 MG tablet Take 1 tablet (10 mg total) by mouth daily.   tadalafil  (CIALIS ) 20 MG tablet Take 1 tablet (20 mg total) by mouth daily.   valACYclovir (VALTREX) 1000 MG tablet Take 1,000 mg by mouth daily as needed (fever blisters).   warfarin (COUMADIN ) 5 MG tablet Take as directed by the anticoagulation clinic. (Patient taking differently: Take 5-7.5 mg by mouth See admin instructions. Take as directed by the anticoagulation clinic. 7.5 mg Tues Thur and Sat, 5 mg all other days)   No facility-administered encounter medications on file as of 06/20/2024.    Allergies as of 06/20/2024 - Review Complete 06/20/2024  Allergen Reaction Noted   Cephalexin Hives 04/22/2016   Methotrexate Nausea And Vomiting     Past Medical History:  Diagnosis Date   Abscess of right breast    sternal osteomyeliitis, pseudomonas   C. 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    Cataract    Mixed OU   CHF (congestive heart failure) (HCC)    secondary to mmoderate mitral stenosis and moderate mitral regurgitation in the setting of rheumatic vlave disease as well as severe pulmonary HTN likely due to a combination of MV disease and pulmoanary arterial HTN in  the setting of collagen vascular disease. Not valvuloplasty candidate due to MR. patient had MV replacement with mechanical MV in 8/11.   CHF (congestive heart failure) (HCC)    Echo post-op showed normally functioning mechanical MV, EF 60-65% with septal bounce, small mobile structure in the mid ventricle (? loose papillary muscle), RV-RA gradient 22 mmHg. Echo (10/11): EF 60-65%, normally functioning mechanical mitral vlavue.    Hypertensive retinopathy    OU   Mixed connective tissue disease (HCC)    diagnosis 1990. 8/10 ANA positive,  anti-dDNA positive, anti-SCL70 negative   Nephrotic syndrome    secondary to SLE   Pulmonary HTN (HCC)    porbably secondary to combination of rheumatic MV disease and pulmonary arterial HYN form vollagen vascalr disease. CTA chest (7/10) with no evidence for pulmonary emboli or chronic pulmonary emboli. PA pressure 72/28 mean 50 with PVR 5.7 WU on initial RHC, PA pressure 45/21 mean 30 PVR 2.3 WU on f/u RHC on Revatio  80 mg three times a day (1/11).  After MV surgery, Revatio  stopped.   Pulmonary HTN (HCC)    increased  dyspnea. Repeat RHC with mean RA 6, PA 42/20 mean 29, PVR 4.8 WU,  2.3. Revatio  20 mg  3x a day restarted. 3/11 6 min walk 434m. 2/12 6 min walk 427 m.    Raynaud's syndrome    Restrictive lung disease    secondary to MCTD. PFTs 03/06/99 VC 47% DLCO 26%. PFTs 09/12/08 VC 43% DLCO 45%. PFTs 6/10 FVC 37%, FEV1 34%, ratio 69%, TLC 50%. using home O2 periodically   Rheumatic fever    Warfarin anticoagulation    for mechanical MV, goal INR 2.5-3.5    Past Surgical History:  Procedure Laterality Date   BREAST CYST ASPIRATION  03/09/2012   CESAREAN SECTION     COLONOSCOPY     RIGHT HEART CATH N/A 08/10/2021   Procedure: RIGHT HEART CATH;  Surgeon: Rolan Ezra RAMAN, MD;  [facility] INVASIVE CV LAB;  Service: Cardiovascular;  Laterality: N/A;   RIGHT HEART CATH N/A 05/17/2024   Procedure: RIGHT HEART CATH;  Surgeon: Rolan Ezra RAMAN, MD;  Location: [facility] INVASIVE CV LAB;  Service: Cardiovascular;  Laterality: N/A;   s/p mitral valvue replacement  05/13/2010   by Dr. Dusty by minimally invasive window.    TUBAL LIGATION      Family History  Problem Relation Age of Onset   Emphysema Mother        smoker   COPD Mother    Heart disease Maternal Grandmother        side of family not given   Diabetes Maternal Grandmother    Hypertension Father    Osteoarthritis Father    Sleep apnea Father    Breast cancer Maternal Aunt     Social History   Socioeconomic History   Marital  status: Single    Spouse name: Not on file   Number of children: Not on file   Years of education: Not on file   Highest education level: Not on file  Occupational History   Not on file  Tobacco Use   Smoking status: Never    Passive exposure: Past   Smokeless tobacco: Never  Vaping Use   Vaping status: Never Used  Substance and Sexual Activity   Alcohol use: No   Drug use: No   Sexual activity: Not Currently    Birth control/protection: Other-see comments    Comment: BTL  Other Topics Concern   Not on  file  Social History Narrative   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    Social Drivers of Corporate investment banker Strain: Not on file  Food Insecurity: Not on file  Transportation Needs: Not on file  Physical Activity: Not on file  Stress: Not on file  Social Connections: Not on file  Intimate Partner Violence: Not on file    Review of Systems  Respiratory:  Positive for cough and shortness of breath.     Vitals:   06/20/24 0924  BP: 102/68  Pulse: 87  SpO2: 92%   Physical Exam Constitutional:      Appearance: Normal appearance.  HENT:     Head: Normocephalic.     Mouth/Throat:     Mouth: Mucous membranes are moist.  Eyes:     General: No scleral icterus. Cardiovascular:     Rate and Rhythm: Normal rate and regular rhythm.     Heart sounds: No murmur heard.    No friction rub.  Pulmonary:     Effort: No respiratory distress.     Breath sounds: No stridor. No wheezing or rhonchi.  Musculoskeletal:     Cervical back: No rigidity or tenderness.  Neurological:     Mental Status: She is alert.  Psychiatric:        Mood and Affect: Mood normal.    Data Reviewed: Most recent PFT 05/12/2023-reviewed by myself-severe restriction suggested by [facility] of 38%, moderately reduced diffusing capacity  CT scan of the chest 05/23/2023 with evidence of emphysema-centrilobular and paraseptal evidence of postinflammatory  scarring  Echocardiogram 11/15/2022-ejection fraction of 55 to 60%, right ventricular systolic pressure of 33.2  Did review last couple of office visits by Dr. Geronimo in our records  Recent right heart catheterization-right atrium mean of 5, right ventricular pressure of 43/6 PA mean of 44/15 with a mean of 25, PCWP of 9, cardiac index of 2.33, PVR 3.5 Woods units  Assessment:  Chronic obstructive pulmonary disease/emphysema - Will continue Trelegy  Mild pulmonary hypertension - Resume tadalafil   Chronic diastolic heart failure - Continue to follow-up with cardiology  Severe restrictive lung disease on PFT  Shortness of breath on exertion Mixed connective tissue disease - Multifactorial  History of rheumatic valve disease was post mechanical mitral valve replacement  Has a history of obstructive sleep apnea on CPAP therapy  Plan/Recommendations: Continue Trelegy 100  Continue tadalafil   Graded activities as tolerated  Follow-up in 6 months  Continue CPAP therapy  Encouraged to call with significant concerns    Jennet Epley MD [facility] 06/20/2024, 9:33 AM  CC: Royden Ronal Czar, FNP

## 2024-06-20 NOTE — Patient Instructions (Signed)
 I will see you back in 6 months  Continue using your Trelegy  Graded activities as tolerated  Exercising regularly does help  Call us  with significant concerns

## 2024-07-03 ENCOUNTER — Ambulatory Visit (HOSPITAL_COMMUNITY)
Admission: RE | Admit: 2024-07-03 | Discharge: 2024-07-03 | Disposition: A | Discharge status: Home / Self Care | Source: Ambulatory Visit | Attending: Cardiology | Admitting: Cardiology

## 2024-07-03 ENCOUNTER — Ambulatory Visit (HOSPITAL_COMMUNITY): Payer: Self-pay | Admitting: Cardiology

## 2024-07-03 DIAGNOSIS — E785 Hyperlipidemia, unspecified: Secondary | ICD-10-CM | POA: Insufficient documentation

## 2024-07-03 LAB — HEPATIC FUNCTION PANEL
ALT: 8 U/L (ref 0–44)
AST: 18 U/L (ref 15–41)
Albumin: 3.2 g/dL — ABNORMAL LOW (ref 3.5–5.0)
Alkaline Phosphatase: 67 U/L (ref 38–126)
Bilirubin, Direct: 0.1 mg/dL (ref 0.0–0.2)
Indirect Bilirubin: 0.5 mg/dL (ref 0.3–0.9)
Total Bilirubin: 0.6 mg/dL (ref 0.0–1.2)
Total Protein: 6.4 g/dL — ABNORMAL LOW (ref 6.5–8.1)

## 2024-07-03 LAB — LIPID PANEL
Cholesterol: 110 mg/dL (ref 0–200)
HDL: 46 mg/dL (ref >40)
LDL Cholesterol: 53 mg/dL (ref 0–99)
Total CHOL/HDL Ratio: 2.4 ratio
Triglycerides: 54 mg/dL (ref <150)
VLDL: 11 mg/dL (ref 0–40)

## 2024-07-05 ENCOUNTER — Telehealth: Payer: Self-pay | Admitting: Family Medicine

## 2024-07-05 NOTE — Telephone Encounter (Signed)
 MYC cxl

## 2024-07-12 ENCOUNTER — Other Ambulatory Visit (HOSPITAL_COMMUNITY): Payer: Self-pay

## 2024-08-03 ENCOUNTER — Other Ambulatory Visit: Payer: Self-pay | Admitting: Medical Genetics

## 2024-08-10 ENCOUNTER — Encounter

## 2024-08-10 ENCOUNTER — Other Ambulatory Visit

## 2024-08-12 ENCOUNTER — Other Ambulatory Visit (HOSPITAL_COMMUNITY): Payer: Self-pay

## 2024-08-14 ENCOUNTER — Other Ambulatory Visit (HOSPITAL_COMMUNITY): Payer: Self-pay

## 2024-08-16 ENCOUNTER — Other Ambulatory Visit

## 2024-08-16 ENCOUNTER — Encounter

## 2024-08-21 ENCOUNTER — Ambulatory Visit
Admission: RE | Admit: 2024-08-21 | Discharge: 2024-08-21 | Disposition: A | Source: Ambulatory Visit | Attending: Registered Nurse | Admitting: Registered Nurse

## 2024-08-21 ENCOUNTER — Ambulatory Visit
Admission: RE | Admit: 2024-08-21 | Discharge: 2024-08-21 | Disposition: A | Discharge status: Home / Self Care | Source: Ambulatory Visit | Attending: Registered Nurse | Admitting: Registered Nurse

## 2024-08-21 DIAGNOSIS — N6320 Unspecified lump in the left breast, unspecified quadrant: Secondary | ICD-10-CM

## 2024-08-27 ENCOUNTER — Encounter (INDEPENDENT_AMBULATORY_CARE_PROVIDER_SITE_OTHER): Payer: Self-pay | Admitting: Otolaryngology

## 2024-08-27 ENCOUNTER — Ambulatory Visit (INDEPENDENT_AMBULATORY_CARE_PROVIDER_SITE_OTHER): Admitting: Otolaryngology

## 2024-08-27 VITALS — BP 105/73 | HR 84 | Ht 65.0 in | Wt 195.0 lb

## 2024-08-27 DIAGNOSIS — H8102 Meniere's disease, left ear: Secondary | ICD-10-CM

## 2024-08-27 DIAGNOSIS — H9042 Sensorineural hearing loss, unilateral, left ear, with unrestricted hearing on the contralateral side: Secondary | ICD-10-CM

## 2024-08-27 DIAGNOSIS — H9312 Tinnitus, left ear: Secondary | ICD-10-CM

## 2024-08-27 DIAGNOSIS — R42 Dizziness and giddiness: Secondary | ICD-10-CM

## 2024-08-27 NOTE — Progress Notes (Signed)
 Follow-up: Recurrent dizziness, left ear hearing loss, left ear Mnire's disease  HPI: The patient is a 58 year old female who returns today for follow-up evaluation.  The patient was previously seen for her recurrent dizziness, asymmetric left ear low-frequency sensorineural hearing loss, left aural pressure, and left ear tinnitus.  She was diagnosed with left ear Mnire's disease.  She was treated with a low-salt diet and Lasix  diuretic.  The patient returns today reporting intermittent dizziness.  Approximately 2 weeks ago, she had an episode of spinning vertigo that lasted for 5 minutes.  She denies any nausea or vomiting.  She also denies any recent change in her hearing.  Currently she denies any otalgia, otorrhea, or vertigo.  Exam: General: Communicates without difficulty, well nourished, no acute distress. Head: Normocephalic, no evidence injury, no tenderness, facial buttresses intact without stepoff. Face/sinus: No tenderness to palpation and percussion. Facial movement is normal and symmetric. Eyes: PERRL, EOMI. No scleral icterus, conjunctivae clear. Neuro: CN II exam reveals vision grossly intact.  No nystagmus at any point of gaze.  Ears: Both ear canals and tympanic membranes are noted to be normal.  No middle ear effusion is noted.  Nose: External evaluation reveals normal support and skin without lesions.  Dorsum is intact.  Anterior rhinoscopy reveals pink mucosa over anterior aspect of inferior turbinates and intact septum.  No purulence noted. Oral:  Oral cavity and oropharynx are intact, symmetric, without erythema or edema.  Mucosa is moist without lesions. Neck: Full range of motion without pain.  There is no significant lymphadenopathy.  No masses palpable.  Thyroid bed within normal limits to palpation.  Parotid glands and submandibular glands equal bilaterally without mass.  Trachea is midline. Neuro:  CN 2-12 grossly intact. Gait wide-based. Vestibular: No nystagmus at any point  of gaze. Dix Hallpike negative. Vestibular: There is no nystagmus with pneumatic pressure on either tympanic membrane or Valsalva. The cerebellar examination is unremarkable.    Assessment: 1.  Left ear Mnire's disease, with intermittent mild exacerbations. 2.  Subjectively stable left ear tinnitus, hearing loss, and aural pressure. 3.  Her ear canals, tympanic membranes, and middle ear spaces are all normal.  Plan: 1.  The physical exam findings are reviewed with the patient. 2.  The pathophysiology of Mnire's disease and dizziness are extensively discussed with the patient.  Questions were invited and answered. 3.  The treatment options for Mnire's disease are also discussed.  Continue with the 1500 mg low-salt diet and Lasix  diuretic.  If her dizziness worsens, she may benefit from endolymphatic sac decompression surgery in the future. 4.  The patient will return for reevaluation in 6 months, sooner if needed.

## 2024-09-03 ENCOUNTER — Telehealth: Payer: Self-pay | Admitting: Family Medicine

## 2024-09-03 ENCOUNTER — Other Ambulatory Visit

## 2024-09-03 ENCOUNTER — Other Ambulatory Visit: Payer: Self-pay

## 2024-09-03 NOTE — Telephone Encounter (Signed)
 Patient called to reschedule appointment due to scheduling conflict

## 2024-09-12 ENCOUNTER — Ambulatory Visit: Admitting: Family Medicine

## 2024-09-17 ENCOUNTER — Other Ambulatory Visit: Payer: Self-pay | Admitting: Medical Genetics

## 2024-09-17 ENCOUNTER — Other Ambulatory Visit (HOSPITAL_BASED_OUTPATIENT_CLINIC_OR_DEPARTMENT_OTHER): Payer: Self-pay | Admitting: Obstetrics and Gynecology

## 2024-09-17 ENCOUNTER — Other Ambulatory Visit

## 2024-09-17 DIAGNOSIS — Z006 Encounter for examination for normal comparison and control in clinical research program: Secondary | ICD-10-CM

## 2024-09-17 DIAGNOSIS — M8580 Other specified disorders of bone density and structure, unspecified site: Secondary | ICD-10-CM

## 2024-09-25 LAB — GENECONNECT MOLECULAR SCREEN

## 2024-10-02 ENCOUNTER — Other Ambulatory Visit (HOSPITAL_COMMUNITY): Payer: Self-pay

## 2024-10-08 ENCOUNTER — Encounter (HOSPITAL_COMMUNITY): Payer: Self-pay

## 2024-10-08 ENCOUNTER — Ambulatory Visit (HOSPITAL_COMMUNITY)
Admission: RE | Admit: 2024-10-08 | Discharge: 2024-10-08 | Disposition: A | Discharge status: Home / Self Care | Source: Ambulatory Visit | Attending: Physician Assistant | Admitting: Physician Assistant

## 2024-10-08 VITALS — BP 120/64 | HR 86 | Wt 196.2 lb

## 2024-10-08 DIAGNOSIS — I5032 Chronic diastolic (congestive) heart failure: Secondary | ICD-10-CM | POA: Diagnosis not present

## 2024-10-08 DIAGNOSIS — Z7982 Long term (current) use of aspirin: Secondary | ICD-10-CM | POA: Diagnosis not present

## 2024-10-08 DIAGNOSIS — J449 Chronic obstructive pulmonary disease, unspecified: Secondary | ICD-10-CM | POA: Diagnosis not present

## 2024-10-08 DIAGNOSIS — Z7901 Long term (current) use of anticoagulants: Secondary | ICD-10-CM | POA: Insufficient documentation

## 2024-10-08 DIAGNOSIS — I059 Rheumatic mitral valve disease, unspecified: Secondary | ICD-10-CM | POA: Diagnosis not present

## 2024-10-08 DIAGNOSIS — Z79624 Long term (current) use of inhibitors of nucleotide synthesis: Secondary | ICD-10-CM | POA: Insufficient documentation

## 2024-10-08 DIAGNOSIS — I272 Pulmonary hypertension, unspecified: Secondary | ICD-10-CM | POA: Insufficient documentation

## 2024-10-08 DIAGNOSIS — J439 Emphysema, unspecified: Secondary | ICD-10-CM | POA: Diagnosis not present

## 2024-10-08 DIAGNOSIS — J8410 Pulmonary fibrosis, unspecified: Secondary | ICD-10-CM | POA: Diagnosis not present

## 2024-10-08 DIAGNOSIS — G4733 Obstructive sleep apnea (adult) (pediatric): Secondary | ICD-10-CM | POA: Diagnosis not present

## 2024-10-08 DIAGNOSIS — Z79899 Other long term (current) drug therapy: Secondary | ICD-10-CM | POA: Insufficient documentation

## 2024-10-08 DIAGNOSIS — Z87441 Personal history of nephrotic syndrome: Secondary | ICD-10-CM | POA: Diagnosis not present

## 2024-10-08 DIAGNOSIS — Z952 Presence of prosthetic heart valve: Secondary | ICD-10-CM | POA: Diagnosis not present

## 2024-10-08 DIAGNOSIS — E785 Hyperlipidemia, unspecified: Secondary | ICD-10-CM | POA: Insufficient documentation

## 2024-10-08 NOTE — Patient Instructions (Signed)
" °  Follow-Up in: 4 MONTHS WITH AN ECHO AND FOLLOW UP WITH DR. ROLAN PLEASE CALL OUR OFFICE AROUND FEBRUARY TO GET SCHEDULED FOR YOUR APPOINTMENT. PHONE NUMBER IS [phone] OPTION 2   At the [facility], you and your health needs are our priority. We have a designated team specialized in the treatment of Heart Failure. This Care Team includes your primary Heart Failure Specialized Cardiologist (physician), Advanced Practice Providers (APPs- Physician Assistants and Nurse Practitioners), and Pharmacist who all work together to provide you with the care you need, when you need it.   You may see any of the following providers on your designated Care Team at your next follow up:  Dr. Toribio Fuel Dr. Ezra Rolan Dr. Odis Brownie Greig Mosses, NP Caffie Shed, [place] [facility] [place], [place] Beckey Coe, NP Jordan Lee, NP Tinnie Redman, PharmD   Please be sure to bring in all your medications bottles to every appointment.   Need to Contact Us :  If you have any questions or concerns before your next appointment please send us  a message through [place] or call our office at [phone].    TO LEAVE A MESSAGE FOR THE NURSE SELECT OPTION 2, PLEASE LEAVE A MESSAGE INCLUDING: YOUR NAME DATE OF BIRTH CALL BACK NUMBER REASON FOR CALL**this is important as we prioritize the call backs  YOU WILL RECEIVE A CALL BACK THE SAME DAY AS LONG AS YOU CALL BEFORE 4:00 PM "

## 2024-10-08 NOTE — Progress Notes (Signed)
 " [facility]  PCP: Dr. Clarice HF Cardiology: Dr. Rolan  58 y.o. 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    Echo in 1/19 showed EF 60-65%, mechanical mitral valve with mean gradient 5 mmHg, and PA systolic pressure 33 mmHg with normal RV.  Echo in 8/20 showed EF 50-55%, normal RV, mechanical MV with mean gradient 6 mmHg, unable to estimate PA systolic pressure, normal RV.  Echo in 11/21 showed EF 60-65%, RV probably normal, PASP 25 mmHg, mechanical MV with mean gradient 5 mmHg.   RHC was done in 10/22 after stopping sildenafil . This showed normal filling pressures and normal PA pressure.   Echo 2/24 EF 55-60%, mildly decreased RV systolic function, PASP 33 mmHg, mechanical mitral valve with no MR and stable mean gradient 5 mmHg.   Echo 6/25 showed EF 55-60%, RV mildly reduced systolic function, PASP 40 mmHg, mechanical mitral valve with mean gradient 2 mmHg and normal IVC.   RHC 8/25 showed mild pulmonary hypertension, normal R/L filling pressures and preserved CO. Suspect mixed WHO group 1 (mixed connective tissue disorder) and group 3 with possible component group 2 pulmonary hypertension. Started on tadalafil  20 mg daily.  Here today for CHF follow-up. She has been stable without significant shortness of breath, orthopnea or PND. Weight up about 2 lb from last visit but no concern for fluid retention. Has a little bit of leg edema intermittently and reports dark skin changes to anterior shins. She is taking medications as prescribed. Does not exercise regularly. Can perform ADLs without issue. Has not been wearing CPAP every night.   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   ECG (personally reviewed): none ordered  today  Labs (6/25): K 4.4, creatinine 0.8, BNP 78 Labs (8/25): K 4.5, creatinine 0.89, LDL 98  Past Medical History: 1. Mixed connective tissue disease: diagnosis 1990. 8/10 ANA positive, anti-dsDNA positive, anti-SCL70 negative 2. RAYNAUDS SYNDROME  3. 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     - PFT's 03/06/99  VC 47%  DLC0 26%    - PFT's 09/12/08  VC 43%  DLC0 45%    - PFT's 6/10 FVC 37%, FEV1 34%, ratio 69%, TLC 50%    - PFTs 11/15 FVC 39%, FEV1 35%, ratio 89%, TLC 63%, DLCO 48%    - CT chest in 2/22 showed emphysema but no ILD.  4. Nephrotic syndrome: secondary to MCTD 5. CHF: Secondary to moderate mitral stenosis and moderate mitral regurgitation in the setting of rheumatic mitral valve disease as well as severe pulmonary HTN likely due to a combination of MV disease and pulmonary arterial HTN in the setting of collagen vascular disease.   Not valvuloplasty candidate due to MR.  Patient had MV replacement with mechanical MV in 8/11.  Echo (8/11) post-op showed normally functioning mechanical MV, EF 60-65% with septal bounce, small mobile structure in the mid ventricle (? loose papillary muscle), RV-RA gradient 22 mmHg.  Echo (10/11): EF 60-65%, normally functioning mechanical mitral valve.  Echo (7/13): EF 55-60%, mechanical mitral valve with mean gradient 4 mmHg, PA systolic pressure 29 mmHg. Echo (5/15) with EF 55-60%, mechanical mitral valve functioning normally, normal RV size and systolic function, PA systolic pressure 32 mmHg.  - Echo in 5/17 showed EF 60-65%, normal mechanical  mitral valve, and PA systolic pressure 33 mmHg with normal RV.  - Echo (1/19): EF 60-65%, mild LVH, normal RV size and systolic function, mechanical mitral valve with mean gradient 6 mmHg, PASP 33 mmHg.  - Echo (8/20): EF 50-55%, normal RV, mechanical MV with mean gradient 6 mmHg, unable to estimate PA systolic pressure, normal RV.  No change from prior.  - Echo (11/21): EF 60-65%, RV probably  normal, PASP 25 mmHg, mechanical MV with mean gradient 5 mmHg. - Echo (2/24): EF 55-60%, mildly decreased RV systolic function, PASP 33 mmHg, mechanical mitral valve with no MR and stable mean gradient 5 mmHg.  - Echo (6/25): EF 55-60%, RV mildly reduced systolic function, PASP 40 mmHg, mechanical mitral valve with mean gradient 2 mmHg and normal IVC.  6.  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  CTA chest (7/10) with no evidence for pulmonary emboli or chronic pulmonary emboli.  PA pressure 72/28 mean 50 with PVR 5.7 WU on initial RHC, PA pressure 45/21 mean 30 PVR 2.3 WU on f/u RHC on Revatio  80 mg three times a day (1/11).  After MV surgery, Revatio  stopped.  Increased dyspnea.  Repeat RHC with mean RA 6, PA 42/20 (mean 29), PVR 4.8 WU, CI 2.3.  Revatio  20 mg three times a day restarted.  Echo (7/13) with PA systolic pressure 29 mmHg. Echo (5/15) with PA systolic pressure 32 mmHg.  - RHC (10/22): mean RA 1, PA 28/8 mean 18, mean PCWP 7, CI 2.83, PVR 2 WU.  - RHC (8/25): RA 5, PA 44/15 (25), PCWP mean 9, PAPi 5.8, CI (Fick) 2.33 7.  Rheumatic fever 8.  LHC (9/10): no angiographic CAD.  9.  Warfarin anticoagulation for mechanical MV, goal INR 2.5-3.5 10. 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  11. 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 C difficile diarrhea 1/12 13. GERD 14. Holter (1/19): No significant arrhythmias.  15. COPD: CT chest in 2/22 showed emphysema but no ILD.  Suspect due to passive smoking.  - High resolution CT chest (8/24): Tracheobronchomalacia, moderate to severe emphysema, no ILD, +coronary calcification.   Family History: Emphysema mother, smoker.  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   ROS: All systems reviewed and negative except  as noted in HPI.   Current Outpatient Medications  Medication Sig Dispense Refill   acetaminophen  (TYLENOL ) 325 MG tablet Take 650 mg by mouth every 6 (six) hours as needed for moderate pain.     anifrolumab-fnia 300 mg in sodium chloride  0.9 % Inject 300 mg into the vein every 30 (thirty) days. Saphnelo     aspirin  81 MG EC tablet Take 81 mg by mouth daily.     azaTHIOprine (IMURAN) 50 MG tablet Take 50 mg by mouth daily.      Calcium  Carb-Cholecalciferol (CALCIUM  600+D3 PO) Take 1 tablet by mouth every morning.     cholecalciferol (VITAMIN D3) 25 MCG (1000 UNIT) tablet Take 1,000 Units by mouth daily.     Cyanocobalamin (B-12) 1000 MCG TABS Take 1,000 mcg by mouth daily.     Fluticasone-Umeclidin-Vilant (TRELEGY ELLIPTA ) 100-62.5-25 MCG/ACT AEPB Inhale 1 puff into the lungs daily. 60 each 3   folic acid (FOLVITE) 800 MCG tablet Take 800 mcg by mouth daily.     furosemide  (LASIX ) 20 MG tablet Take 2 tablets (40 mg total)  by mouth daily. 180 tablet 3   Magnesium  250 MG TABS Take 250 mg by mouth daily.     metoprolol  succinate (TOPROL -XL) 25 MG 24 hr tablet Take 0.5 tablets (12.5 mg total) by mouth daily. 45 tablet 3   Multiple Vitamin (MULTIVITAMIN) tablet Take 1 tablet by mouth daily.     potassium chloride  SA (KLOR-CON  M20) 20 MEQ tablet Take 2 tablets (40 mEq total) by mouth daily. 180 tablet 3   predniSONE (DELTASONE) 5 MG tablet Take 5 mg by mouth daily.     rosuvastatin  (CRESTOR ) 10 MG tablet Take 1 tablet (10 mg total) by mouth daily. 90 tablet 3   tadalafil  (CIALIS ) 20 MG tablet Take 1 tablet (20 mg total) by mouth daily. 30 tablet 11   valACYclovir (VALTREX) 1000 MG tablet Take 1,000 mg by mouth daily as needed (fever blisters).     warfarin (COUMADIN ) 5 MG tablet Take as directed by the anticoagulation clinic. (Patient taking differently: Take 5-7.5 mg by mouth See admin instructions. Take as directed by the anticoagulation clinic. 7.5 mg Tues Thur and Sat, 5 mg all other days) 135  tablet 3   No current facility-administered medications for this encounter.   BP 120/64   Pulse 86   Wt 89 kg (196 lb 3.2 oz)   SpO2 96%   BMI 32.65 kg/m   Wt Readings from Last 3 Encounters:  10/08/24 89 kg (196 lb 3.2 oz)  08/27/24 88.5 kg (195 lb)  06/20/24 88.3 kg (194 lb 9.6 oz)   Physical Exam General:  Well appearing. Ambulated into clinic. Neck: no JVD.  Cor: Regular rate & rhythm. Mechanical S1. No murmur. Lungs: clear Abdomen: soft, nontender, nondistended. Extremities: trace edema, + venous stasis changes Neuro: alert & orientedx3. Affect pleasant  Labs at [facility] 11/25:  Scr 0.93, K 4.4, CO2 27, AST and ALT WNL, Hgb 14.4   Assessment/Plan:  1. Status post mechanical MVR:  Mitral valve replacement, stable from valve standpoint. Echo 06/25 with preserved EF and normal functioning mitral valve prosthesis.  She is on coumadin  goal INR 2.5 - 3.5 and ASA 81 mg daily. INR followed at [facility].   Denies bleeding issues. 2.  Chronic diastolic CHF/Pulmonary HTN:  Pulmonary HTN was likely a mixed picture, from mitral valve disease as well as mixed connective tissue disease and possibly WHO group 3 from COPD.  She had mild residual pulmonary HTN with moderately increased PVR even after mitral valve surgery.  The residual pulmonary HTN may be due to pulmonary vascular remodeling with chronic left atrial pressure elevation in the setting of mitral valve disease. Revatio  was restarted at 20 mg tid.  Echo in 8/20 showed RV normal but unable to estimate PA systolic pressure.  Echo in 11/21 showed normal RV with PASP 25 mm Hg.  She was off sildenafil .  RHC in 10/22 showed normal filling pressures and normal PA pressure (off sildenafil ). However, PASP estimated at 40 mmHg and RV mildly reduced on echo in 6/25. RHC 8/25 showed mild pulmonary hypertension, normal R/L filling pressures and preserved CO, started on tadalafil . NYHA II. She is not volume overloaded on exam. Suspect mild  lower extremity edema and skin changes are d/t venous insufficiency. Encouraged regular use of compression stockings. - Continue 40 mg lasix  and 40 mEq KCL daily. Has regular labs through [facility]. Scr and K stable on labs 11/25 as above - Continue tadalafil  20 mg daily. - Recommended better compliance with CPAP - Repeat echo prior  to next f/u 3. Mixed connective tissue disorder: Patient has history of pulmonary fibrosis, but most recent CT in 2/22 showed emphysema and no significant fibrosis. Followed by rheumatology at [facility]. Unable to come off prednisone.  - On prednisone and azathioprine. Now also on Saphnelo once monthly infusion. 4. COPD: Emphysema on 2/22 CT chest.  Thought to be due to passive smoking.  HRCT in 8/24 with no definite ILD but tracheobronchomalacia and moderate-severe emphysema noted.  -  Trelegy has helped 5. OSA: Encouraged better adherence with CPAP 6. Hyperlipidemia:  Continue rosuvastatin  20 mg daily.  - LDL at goal (53 in 9/25)   Follow up 4 months with Dr. Rolan with echo  [facility], [facility] PA-C 10/08/2024  "

## 2024-10-28 ENCOUNTER — Other Ambulatory Visit (HOSPITAL_COMMUNITY): Payer: Self-pay

## 2024-10-31 ENCOUNTER — Ambulatory Visit: Admitting: Family Medicine

## 2024-12-03 ENCOUNTER — Ambulatory Visit: Admitting: Family Medicine

## 2024-12-18 ENCOUNTER — Ambulatory Visit: Admitting: Pulmonary Disease

## 2025-02-25 ENCOUNTER — Ambulatory Visit (INDEPENDENT_AMBULATORY_CARE_PROVIDER_SITE_OTHER): Admitting: Otolaryngology

## 2025-03-08 ENCOUNTER — Encounter (INDEPENDENT_AMBULATORY_CARE_PROVIDER_SITE_OTHER): Admitting: Ophthalmology
